# Patient Record
Sex: Female | Born: 1955 | Race: Black or African American | Hispanic: No | Smoking: Former smoker
Health system: Southern US, Community
[De-identification: ages and names within clinical notes are randomized; demographics above are authoritative.]

## PROBLEM LIST (undated history)

## (undated) DIAGNOSIS — F329 Major depressive disorder, single episode, unspecified: Secondary | ICD-10-CM

## (undated) DIAGNOSIS — G4730 Sleep apnea, unspecified: Secondary | ICD-10-CM

## (undated) DIAGNOSIS — R351 Nocturia: Secondary | ICD-10-CM

## (undated) DIAGNOSIS — F32A Depression, unspecified: Secondary | ICD-10-CM

## (undated) DIAGNOSIS — G4733 Obstructive sleep apnea (adult) (pediatric): Secondary | ICD-10-CM

## (undated) DIAGNOSIS — I10 Essential (primary) hypertension: Secondary | ICD-10-CM

## (undated) DIAGNOSIS — E7800 Pure hypercholesterolemia, unspecified: Secondary | ICD-10-CM

## (undated) HISTORY — DX: Depression, unspecified: F32.A

## (undated) HISTORY — PX: PARTIAL HYSTERECTOMY: SHX80

## (undated) HISTORY — DX: Major depressive disorder, single episode, unspecified: F32.9

## (undated) HISTORY — DX: Pure hypercholesterolemia, unspecified: E78.00

## (undated) HISTORY — PX: BREAST EXCISIONAL BIOPSY: SUR124

## (undated) HISTORY — PX: KNEE SURGERY: SHX244

## (undated) HISTORY — PX: CYSTECTOMY: SUR359

## (undated) HISTORY — DX: Morbid (severe) obesity due to excess calories: E66.01

## (undated) HISTORY — DX: Obstructive sleep apnea (adult) (pediatric): G47.33

## (undated) HISTORY — DX: Nocturia: R35.1

## (undated) HISTORY — PX: TONSILLECTOMY: SUR1361

## (undated) HISTORY — PX: BREAST LUMPECTOMY: SHX2

---

## 1997-10-01 ENCOUNTER — Emergency Department (HOSPITAL_COMMUNITY): Admission: EM | Admit: 1997-10-01 | Discharge: 1997-10-01 | Payer: Self-pay | Admitting: Emergency Medicine

## 1997-10-20 ENCOUNTER — Ambulatory Visit (HOSPITAL_COMMUNITY): Admission: RE | Admit: 1997-10-20 | Discharge: 1997-10-20 | Payer: Self-pay | Admitting: Family Medicine

## 1998-10-05 ENCOUNTER — Other Ambulatory Visit: Admission: RE | Admit: 1998-10-05 | Discharge: 1998-10-05 | Payer: Self-pay | Admitting: Family Medicine

## 1998-12-13 ENCOUNTER — Emergency Department (HOSPITAL_COMMUNITY): Admission: EM | Admit: 1998-12-13 | Discharge: 1998-12-13 | Payer: Self-pay | Admitting: Emergency Medicine

## 1998-12-13 ENCOUNTER — Encounter: Payer: Self-pay | Admitting: Emergency Medicine

## 1999-04-04 ENCOUNTER — Ambulatory Visit (HOSPITAL_COMMUNITY): Admission: RE | Admit: 1999-04-04 | Discharge: 1999-04-04 | Payer: Self-pay | Admitting: Family Medicine

## 1999-04-04 ENCOUNTER — Encounter: Payer: Self-pay | Admitting: Family Medicine

## 1999-06-08 ENCOUNTER — Other Ambulatory Visit: Admission: RE | Admit: 1999-06-08 | Discharge: 1999-06-08 | Payer: Self-pay | Admitting: Family Medicine

## 1999-09-14 ENCOUNTER — Other Ambulatory Visit: Admission: RE | Admit: 1999-09-14 | Discharge: 1999-09-14 | Payer: Self-pay | Admitting: Family Medicine

## 2001-03-22 ENCOUNTER — Emergency Department (HOSPITAL_COMMUNITY): Admission: EM | Admit: 2001-03-22 | Discharge: 2001-03-22 | Payer: Self-pay

## 2003-01-06 ENCOUNTER — Other Ambulatory Visit: Admission: RE | Admit: 2003-01-06 | Discharge: 2003-01-06 | Payer: Self-pay | Admitting: Family Medicine

## 2003-04-23 ENCOUNTER — Ambulatory Visit (HOSPITAL_COMMUNITY): Admission: RE | Admit: 2003-04-23 | Discharge: 2003-04-23 | Payer: Self-pay | Admitting: Family Medicine

## 2003-07-28 ENCOUNTER — Ambulatory Visit (HOSPITAL_COMMUNITY): Admission: RE | Admit: 2003-07-28 | Discharge: 2003-07-28 | Payer: Self-pay | Admitting: Family Medicine

## 2004-01-21 ENCOUNTER — Ambulatory Visit (HOSPITAL_COMMUNITY): Admission: RE | Admit: 2004-01-21 | Discharge: 2004-01-21 | Payer: Self-pay | Admitting: Family Medicine

## 2004-03-01 ENCOUNTER — Ambulatory Visit: Payer: Self-pay | Admitting: Family Medicine

## 2004-03-29 ENCOUNTER — Ambulatory Visit: Payer: Self-pay | Admitting: *Deleted

## 2004-04-04 ENCOUNTER — Ambulatory Visit: Payer: Self-pay | Admitting: Family Medicine

## 2004-04-25 ENCOUNTER — Ambulatory Visit: Payer: Self-pay | Admitting: Family Medicine

## 2004-05-23 ENCOUNTER — Ambulatory Visit: Payer: Self-pay | Admitting: Family Medicine

## 2004-06-13 ENCOUNTER — Ambulatory Visit: Payer: Self-pay | Admitting: Family Medicine

## 2004-07-25 ENCOUNTER — Ambulatory Visit: Payer: Self-pay | Admitting: Family Medicine

## 2004-09-30 ENCOUNTER — Ambulatory Visit: Payer: Self-pay | Admitting: Family Medicine

## 2004-11-03 ENCOUNTER — Ambulatory Visit: Payer: Self-pay | Admitting: Family Medicine

## 2004-11-11 ENCOUNTER — Ambulatory Visit (HOSPITAL_COMMUNITY): Admission: RE | Admit: 2004-11-11 | Discharge: 2004-11-11 | Payer: Self-pay | Admitting: Family Medicine

## 2004-11-18 ENCOUNTER — Ambulatory Visit: Payer: Self-pay | Admitting: Family Medicine

## 2004-11-25 ENCOUNTER — Emergency Department (HOSPITAL_COMMUNITY): Admission: EM | Admit: 2004-11-25 | Discharge: 2004-11-25 | Payer: Self-pay | Admitting: Emergency Medicine

## 2005-01-06 ENCOUNTER — Ambulatory Visit: Payer: Self-pay | Admitting: Family Medicine

## 2005-02-14 ENCOUNTER — Ambulatory Visit: Payer: Self-pay | Admitting: Family Medicine

## 2005-02-24 ENCOUNTER — Ambulatory Visit: Payer: Self-pay | Admitting: Family Medicine

## 2005-03-28 ENCOUNTER — Ambulatory Visit: Payer: Self-pay | Admitting: Family Medicine

## 2005-04-20 ENCOUNTER — Ambulatory Visit: Payer: Self-pay | Admitting: Internal Medicine

## 2005-08-07 ENCOUNTER — Ambulatory Visit: Payer: Self-pay | Admitting: Family Medicine

## 2005-12-04 ENCOUNTER — Ambulatory Visit: Payer: Self-pay | Admitting: Family Medicine

## 2006-01-19 ENCOUNTER — Ambulatory Visit: Payer: Self-pay | Admitting: Family Medicine

## 2006-01-24 ENCOUNTER — Encounter: Admission: RE | Admit: 2006-01-24 | Discharge: 2006-01-24 | Payer: Self-pay | Admitting: Family Medicine

## 2006-02-06 ENCOUNTER — Ambulatory Visit: Payer: Self-pay | Admitting: Family Medicine

## 2006-03-23 ENCOUNTER — Ambulatory Visit: Payer: Self-pay | Admitting: Internal Medicine

## 2006-04-19 ENCOUNTER — Ambulatory Visit: Payer: Self-pay | Admitting: Family Medicine

## 2006-07-03 ENCOUNTER — Encounter (INDEPENDENT_AMBULATORY_CARE_PROVIDER_SITE_OTHER): Payer: Self-pay | Admitting: Family Medicine

## 2006-07-03 ENCOUNTER — Ambulatory Visit: Payer: Self-pay | Admitting: Family Medicine

## 2006-08-24 ENCOUNTER — Ambulatory Visit: Payer: Self-pay | Admitting: Internal Medicine

## 2006-08-28 ENCOUNTER — Emergency Department (HOSPITAL_COMMUNITY): Admission: EM | Admit: 2006-08-28 | Discharge: 2006-08-28 | Payer: Self-pay | Admitting: Family Medicine

## 2006-11-06 ENCOUNTER — Ambulatory Visit: Payer: Self-pay | Admitting: Internal Medicine

## 2006-11-21 ENCOUNTER — Ambulatory Visit: Payer: Self-pay | Admitting: Family Medicine

## 2006-11-23 ENCOUNTER — Ambulatory Visit: Payer: Self-pay | Admitting: Family Medicine

## 2006-12-24 ENCOUNTER — Ambulatory Visit (HOSPITAL_BASED_OUTPATIENT_CLINIC_OR_DEPARTMENT_OTHER): Admission: RE | Admit: 2006-12-24 | Discharge: 2006-12-24 | Payer: Self-pay | Admitting: Internal Medicine

## 2006-12-30 ENCOUNTER — Ambulatory Visit: Payer: Self-pay | Admitting: Internal Medicine

## 2007-01-14 ENCOUNTER — Ambulatory Visit: Payer: Self-pay | Admitting: Family Medicine

## 2007-02-05 DIAGNOSIS — I10 Essential (primary) hypertension: Secondary | ICD-10-CM | POA: Insufficient documentation

## 2007-02-05 DIAGNOSIS — R319 Hematuria, unspecified: Secondary | ICD-10-CM | POA: Insufficient documentation

## 2007-02-05 DIAGNOSIS — F329 Major depressive disorder, single episode, unspecified: Secondary | ICD-10-CM

## 2007-02-05 DIAGNOSIS — B009 Herpesviral infection, unspecified: Secondary | ICD-10-CM | POA: Insufficient documentation

## 2007-02-11 DIAGNOSIS — F1011 Alcohol abuse, in remission: Secondary | ICD-10-CM | POA: Insufficient documentation

## 2007-02-11 DIAGNOSIS — G609 Hereditary and idiopathic neuropathy, unspecified: Secondary | ICD-10-CM | POA: Insufficient documentation

## 2007-02-11 DIAGNOSIS — F411 Generalized anxiety disorder: Secondary | ICD-10-CM | POA: Insufficient documentation

## 2007-02-11 DIAGNOSIS — M949 Disorder of cartilage, unspecified: Secondary | ICD-10-CM

## 2007-02-11 DIAGNOSIS — M899 Disorder of bone, unspecified: Secondary | ICD-10-CM | POA: Insufficient documentation

## 2007-02-11 DIAGNOSIS — K219 Gastro-esophageal reflux disease without esophagitis: Secondary | ICD-10-CM

## 2007-02-11 DIAGNOSIS — J309 Allergic rhinitis, unspecified: Secondary | ICD-10-CM | POA: Insufficient documentation

## 2007-02-27 ENCOUNTER — Encounter (INDEPENDENT_AMBULATORY_CARE_PROVIDER_SITE_OTHER): Payer: Self-pay | Admitting: *Deleted

## 2007-02-28 ENCOUNTER — Emergency Department (HOSPITAL_COMMUNITY): Admission: EM | Admit: 2007-02-28 | Discharge: 2007-02-28 | Payer: Self-pay | Admitting: Family Medicine

## 2007-03-06 ENCOUNTER — Ambulatory Visit (HOSPITAL_BASED_OUTPATIENT_CLINIC_OR_DEPARTMENT_OTHER): Admission: RE | Admit: 2007-03-06 | Discharge: 2007-03-06 | Payer: Self-pay | Admitting: Family Medicine

## 2007-03-06 ENCOUNTER — Encounter (INDEPENDENT_AMBULATORY_CARE_PROVIDER_SITE_OTHER): Payer: Self-pay | Admitting: Family Medicine

## 2007-03-10 ENCOUNTER — Ambulatory Visit: Payer: Self-pay | Admitting: Internal Medicine

## 2007-04-04 ENCOUNTER — Telehealth (INDEPENDENT_AMBULATORY_CARE_PROVIDER_SITE_OTHER): Payer: Self-pay | Admitting: Family Medicine

## 2007-04-09 DIAGNOSIS — G4730 Sleep apnea, unspecified: Secondary | ICD-10-CM | POA: Insufficient documentation

## 2007-04-24 ENCOUNTER — Encounter (INDEPENDENT_AMBULATORY_CARE_PROVIDER_SITE_OTHER): Payer: Self-pay | Admitting: Family Medicine

## 2007-05-01 ENCOUNTER — Emergency Department (HOSPITAL_COMMUNITY): Admission: EM | Admit: 2007-05-01 | Discharge: 2007-05-01 | Payer: Self-pay | Admitting: Emergency Medicine

## 2007-05-01 ENCOUNTER — Telehealth (INDEPENDENT_AMBULATORY_CARE_PROVIDER_SITE_OTHER): Payer: Self-pay | Admitting: Family Medicine

## 2007-05-14 ENCOUNTER — Ambulatory Visit: Payer: Self-pay | Admitting: Family Medicine

## 2007-05-14 DIAGNOSIS — G5600 Carpal tunnel syndrome, unspecified upper limb: Secondary | ICD-10-CM | POA: Insufficient documentation

## 2007-06-25 ENCOUNTER — Telehealth (INDEPENDENT_AMBULATORY_CARE_PROVIDER_SITE_OTHER): Payer: Self-pay | Admitting: Family Medicine

## 2007-07-16 ENCOUNTER — Encounter (INDEPENDENT_AMBULATORY_CARE_PROVIDER_SITE_OTHER): Payer: Self-pay | Admitting: Family Medicine

## 2007-07-16 ENCOUNTER — Ambulatory Visit: Payer: Self-pay | Admitting: Family Medicine

## 2007-07-16 LAB — CONVERTED CEMR LAB
Bilirubin Urine: NEGATIVE
Blood Glucose, Fingerstick: 105
Ketones, urine, test strip: NEGATIVE
Pap Smear: NORMAL
Protein, U semiquant: 30
Specific Gravity, Urine: 1.015
Urobilinogen, UA: 0.2

## 2007-08-20 ENCOUNTER — Encounter (INDEPENDENT_AMBULATORY_CARE_PROVIDER_SITE_OTHER): Payer: Self-pay | Admitting: Family Medicine

## 2007-08-20 ENCOUNTER — Telehealth (INDEPENDENT_AMBULATORY_CARE_PROVIDER_SITE_OTHER): Payer: Self-pay | Admitting: *Deleted

## 2007-08-27 ENCOUNTER — Ambulatory Visit: Payer: Self-pay | Admitting: Family Medicine

## 2007-08-27 DIAGNOSIS — R0989 Other specified symptoms and signs involving the circulatory and respiratory systems: Secondary | ICD-10-CM

## 2007-08-27 DIAGNOSIS — R0609 Other forms of dyspnea: Secondary | ICD-10-CM | POA: Insufficient documentation

## 2007-09-01 DIAGNOSIS — E785 Hyperlipidemia, unspecified: Secondary | ICD-10-CM

## 2007-09-12 ENCOUNTER — Encounter (INDEPENDENT_AMBULATORY_CARE_PROVIDER_SITE_OTHER): Payer: Self-pay | Admitting: Family Medicine

## 2007-09-12 ENCOUNTER — Telehealth (INDEPENDENT_AMBULATORY_CARE_PROVIDER_SITE_OTHER): Payer: Self-pay | Admitting: Family Medicine

## 2007-09-13 ENCOUNTER — Encounter (INDEPENDENT_AMBULATORY_CARE_PROVIDER_SITE_OTHER): Payer: Self-pay | Admitting: Family Medicine

## 2007-09-18 ENCOUNTER — Encounter (INDEPENDENT_AMBULATORY_CARE_PROVIDER_SITE_OTHER): Payer: Self-pay | Admitting: Family Medicine

## 2007-09-24 ENCOUNTER — Encounter (INDEPENDENT_AMBULATORY_CARE_PROVIDER_SITE_OTHER): Payer: Self-pay | Admitting: Family Medicine

## 2007-09-24 ENCOUNTER — Emergency Department (HOSPITAL_COMMUNITY): Admission: EM | Admit: 2007-09-24 | Discharge: 2007-09-24 | Payer: Self-pay | Admitting: Emergency Medicine

## 2007-11-04 ENCOUNTER — Encounter (INDEPENDENT_AMBULATORY_CARE_PROVIDER_SITE_OTHER): Payer: Self-pay | Admitting: Family Medicine

## 2007-11-06 ENCOUNTER — Ambulatory Visit: Payer: Self-pay | Admitting: Family Medicine

## 2007-11-06 DIAGNOSIS — K029 Dental caries, unspecified: Secondary | ICD-10-CM | POA: Insufficient documentation

## 2007-12-10 ENCOUNTER — Telehealth (INDEPENDENT_AMBULATORY_CARE_PROVIDER_SITE_OTHER): Payer: Self-pay | Admitting: Family Medicine

## 2007-12-24 ENCOUNTER — Telehealth (INDEPENDENT_AMBULATORY_CARE_PROVIDER_SITE_OTHER): Payer: Self-pay | Admitting: Family Medicine

## 2008-01-17 ENCOUNTER — Telehealth (INDEPENDENT_AMBULATORY_CARE_PROVIDER_SITE_OTHER): Payer: Self-pay | Admitting: *Deleted

## 2008-01-23 ENCOUNTER — Telehealth (INDEPENDENT_AMBULATORY_CARE_PROVIDER_SITE_OTHER): Payer: Self-pay | Admitting: Family Medicine

## 2008-04-28 ENCOUNTER — Telehealth (INDEPENDENT_AMBULATORY_CARE_PROVIDER_SITE_OTHER): Payer: Self-pay | Admitting: Family Medicine

## 2008-04-30 ENCOUNTER — Encounter (INDEPENDENT_AMBULATORY_CARE_PROVIDER_SITE_OTHER): Payer: Self-pay | Admitting: Family Medicine

## 2008-06-15 ENCOUNTER — Ambulatory Visit (HOSPITAL_COMMUNITY): Admission: RE | Admit: 2008-06-15 | Discharge: 2008-06-16 | Payer: Self-pay | Admitting: Orthopedic Surgery

## 2008-07-09 ENCOUNTER — Telehealth (INDEPENDENT_AMBULATORY_CARE_PROVIDER_SITE_OTHER): Payer: Self-pay | Admitting: Family Medicine

## 2008-07-14 ENCOUNTER — Encounter (INDEPENDENT_AMBULATORY_CARE_PROVIDER_SITE_OTHER): Payer: Self-pay | Admitting: Family Medicine

## 2008-07-16 ENCOUNTER — Encounter (INDEPENDENT_AMBULATORY_CARE_PROVIDER_SITE_OTHER): Payer: Self-pay | Admitting: Nurse Practitioner

## 2008-08-03 ENCOUNTER — Telehealth (INDEPENDENT_AMBULATORY_CARE_PROVIDER_SITE_OTHER): Payer: Self-pay | Admitting: *Deleted

## 2008-08-12 ENCOUNTER — Ambulatory Visit: Payer: Self-pay | Admitting: Family Medicine

## 2008-08-12 LAB — CONVERTED CEMR LAB
Albumin: 4.1 g/dL (ref 3.5–5.2)
Alkaline Phosphatase: 116 units/L (ref 39–117)
Band Neutrophils: 0 % (ref 0–10)
Basophils Absolute: 0 10*3/uL (ref 0.0–0.1)
Basophils Relative: 0 % (ref 0–1)
Calcium: 9.3 mg/dL (ref 8.4–10.5)
Cholesterol: 186 mg/dL (ref 0–200)
Creatinine, Ser: 0.89 mg/dL (ref 0.40–1.20)
Eosinophils Absolute: 0.1 10*3/uL (ref 0.0–0.7)
HDL: 45 mg/dL (ref >39)
MCHC: 32.6 g/dL (ref 30.0–36.0)
Monocytes Relative: 10 % (ref 3–12)
Neutro Abs: 3.5 10*3/uL (ref 1.7–7.7)
Neutrophils Relative %: 46 % (ref 43–77)
Nitrite: NEGATIVE
Platelets: 316 10*3/uL (ref 150–400)
Protein, U semiquant: NEGATIVE
RDW: 15.7 % — ABNORMAL HIGH (ref 11.5–15.5)
Sodium: 141 meq/L (ref 135–145)
Specific Gravity, Urine: <1.005
Total Bilirubin: 0.3 mg/dL (ref 0.3–1.2)
Triglycerides: 138 mg/dL (ref <150)
Urobilinogen, UA: 0.2
VLDL: 28 mg/dL (ref 0–40)
WBC Urine, dipstick: NEGATIVE

## 2008-08-13 ENCOUNTER — Encounter (INDEPENDENT_AMBULATORY_CARE_PROVIDER_SITE_OTHER): Payer: Self-pay | Admitting: Family Medicine

## 2008-08-14 ENCOUNTER — Ambulatory Visit (HOSPITAL_COMMUNITY): Admission: RE | Admit: 2008-08-14 | Discharge: 2008-08-14 | Payer: Self-pay | Admitting: Family Medicine

## 2008-10-16 ENCOUNTER — Telehealth (INDEPENDENT_AMBULATORY_CARE_PROVIDER_SITE_OTHER): Payer: Self-pay | Admitting: Family Medicine

## 2008-10-26 ENCOUNTER — Telehealth (INDEPENDENT_AMBULATORY_CARE_PROVIDER_SITE_OTHER): Payer: Self-pay | Admitting: Family Medicine

## 2008-11-16 ENCOUNTER — Ambulatory Visit: Payer: Self-pay | Admitting: Nurse Practitioner

## 2008-11-16 DIAGNOSIS — M62838 Other muscle spasm: Secondary | ICD-10-CM | POA: Insufficient documentation

## 2008-12-28 ENCOUNTER — Telehealth: Payer: Self-pay | Admitting: Physician Assistant

## 2008-12-28 ENCOUNTER — Inpatient Hospital Stay (HOSPITAL_COMMUNITY): Admission: EM | Admit: 2008-12-28 | Discharge: 2008-12-30 | Payer: Self-pay | Admitting: Emergency Medicine

## 2008-12-29 ENCOUNTER — Encounter (INDEPENDENT_AMBULATORY_CARE_PROVIDER_SITE_OTHER): Payer: Self-pay | Admitting: Cardiovascular Disease

## 2008-12-30 ENCOUNTER — Encounter (HOSPITAL_COMMUNITY): Admission: RE | Admit: 2008-12-30 | Discharge: 2009-03-15 | Payer: Self-pay | Admitting: Cardiovascular Disease

## 2009-01-04 ENCOUNTER — Ambulatory Visit: Payer: Self-pay | Admitting: Physician Assistant

## 2009-01-04 DIAGNOSIS — R51 Headache: Secondary | ICD-10-CM

## 2009-01-04 DIAGNOSIS — G5620 Lesion of ulnar nerve, unspecified upper limb: Secondary | ICD-10-CM | POA: Insufficient documentation

## 2009-01-04 DIAGNOSIS — R519 Headache, unspecified: Secondary | ICD-10-CM | POA: Insufficient documentation

## 2009-01-04 DIAGNOSIS — R079 Chest pain, unspecified: Secondary | ICD-10-CM

## 2009-03-24 ENCOUNTER — Ambulatory Visit: Payer: Self-pay | Admitting: Physician Assistant

## 2009-03-24 DIAGNOSIS — N3000 Acute cystitis without hematuria: Secondary | ICD-10-CM | POA: Insufficient documentation

## 2009-03-24 DIAGNOSIS — R05 Cough: Secondary | ICD-10-CM | POA: Insufficient documentation

## 2009-03-24 LAB — CONVERTED CEMR LAB
Basophils Relative: 0 % (ref 0–1)
Calcium: 9.6 mg/dL (ref 8.4–10.5)
Creatinine, Ser: 0.98 mg/dL (ref 0.40–1.20)
Eosinophils Absolute: 0.1 10*3/uL (ref 0.0–0.7)
Eosinophils Relative: 1 % (ref 0–5)
HCT: 40.6 % (ref 36.0–46.0)
Lymphocytes Relative: 32 % (ref 12–46)
Lymphs Abs: 2.7 10*3/uL (ref 0.7–4.0)
MCV: 92.5 fL (ref 78.0–100.0)
Protein, U semiquant: 100
RDW: 15.4 % (ref 11.5–15.5)
Sodium: 143 meq/L (ref 135–145)
Specific Gravity, Urine: >=1.03
TSH: 0.995 microintl units/mL (ref 0.350–4.500)
Urobilinogen, UA: 0.2
WBC: 8.6 10*3/uL (ref 4.0–10.5)

## 2009-03-25 ENCOUNTER — Encounter: Payer: Self-pay | Admitting: Physician Assistant

## 2009-04-02 ENCOUNTER — Encounter: Payer: Self-pay | Admitting: Physician Assistant

## 2009-04-02 ENCOUNTER — Ambulatory Visit (HOSPITAL_COMMUNITY): Admission: RE | Admit: 2009-04-02 | Discharge: 2009-04-02 | Payer: Self-pay | Admitting: Internal Medicine

## 2009-04-27 ENCOUNTER — Ambulatory Visit: Payer: Self-pay | Admitting: Physician Assistant

## 2009-04-27 DIAGNOSIS — J209 Acute bronchitis, unspecified: Secondary | ICD-10-CM

## 2009-04-27 LAB — CONVERTED CEMR LAB
Albumin: 3.9 g/dL (ref 3.5–5.2)
CO2: 28 meq/L (ref 19–32)
Calcium: 9.3 mg/dL (ref 8.4–10.5)
Chloride: 103 meq/L (ref 96–112)
Glucose, Bld: 75 mg/dL (ref 70–99)
Total Protein: 7 g/dL (ref 6.0–8.3)

## 2009-04-28 ENCOUNTER — Encounter: Payer: Self-pay | Admitting: Physician Assistant

## 2009-06-10 ENCOUNTER — Encounter (INDEPENDENT_AMBULATORY_CARE_PROVIDER_SITE_OTHER): Payer: Self-pay | Admitting: *Deleted

## 2009-06-21 ENCOUNTER — Telehealth (INDEPENDENT_AMBULATORY_CARE_PROVIDER_SITE_OTHER): Payer: Self-pay | Admitting: *Deleted

## 2009-06-24 ENCOUNTER — Ambulatory Visit (HOSPITAL_COMMUNITY): Admission: RE | Admit: 2009-06-24 | Discharge: 2009-06-24 | Payer: Self-pay | Admitting: Physician Assistant

## 2009-06-25 ENCOUNTER — Encounter: Payer: Self-pay | Admitting: Physician Assistant

## 2009-06-29 ENCOUNTER — Encounter: Payer: Self-pay | Admitting: Physician Assistant

## 2009-08-24 ENCOUNTER — Emergency Department (HOSPITAL_COMMUNITY): Admission: EM | Admit: 2009-08-24 | Discharge: 2009-08-25 | Payer: Self-pay | Admitting: Emergency Medicine

## 2009-08-25 ENCOUNTER — Telehealth: Payer: Self-pay | Admitting: Physician Assistant

## 2009-08-27 ENCOUNTER — Telehealth: Payer: Self-pay | Admitting: Physician Assistant

## 2009-09-01 ENCOUNTER — Ambulatory Visit: Payer: Self-pay | Admitting: Physician Assistant

## 2009-09-01 DIAGNOSIS — Z8639 Personal history of other endocrine, nutritional and metabolic disease: Secondary | ICD-10-CM

## 2009-09-01 DIAGNOSIS — R319 Hematuria, unspecified: Secondary | ICD-10-CM | POA: Insufficient documentation

## 2009-09-01 DIAGNOSIS — R930 Abnormal findings on diagnostic imaging of skull and head, not elsewhere classified: Secondary | ICD-10-CM | POA: Insufficient documentation

## 2009-09-01 DIAGNOSIS — Z862 Personal history of diseases of the blood and blood-forming organs and certain disorders involving the immune mechanism: Secondary | ICD-10-CM | POA: Insufficient documentation

## 2009-09-01 DIAGNOSIS — D3500 Benign neoplasm of unspecified adrenal gland: Secondary | ICD-10-CM | POA: Insufficient documentation

## 2009-09-01 LAB — CONVERTED CEMR LAB
Bilirubin Urine: NEGATIVE
Glucose, Urine, Semiquant: NEGATIVE
Ketones, urine, test strip: NEGATIVE
Whiff Test: NEGATIVE

## 2009-09-02 ENCOUNTER — Encounter: Payer: Self-pay | Admitting: Physician Assistant

## 2009-09-02 DIAGNOSIS — J90 Pleural effusion, not elsewhere classified: Secondary | ICD-10-CM | POA: Insufficient documentation

## 2009-09-02 LAB — CONVERTED CEMR LAB
ALT: 19 units/L (ref 0–35)
Albumin: 3.9 g/dL (ref 3.5–5.2)
Alkaline Phosphatase: 140 units/L — ABNORMAL HIGH (ref 39–117)
Bacteria, UA: NONE SEEN
Basophils Absolute: 0 10*3/uL (ref 0.0–0.1)
Basophils Relative: 0 % (ref 0–1)
Calcium: 9 mg/dL (ref 8.4–10.5)
Casts: NONE SEEN /lpf
Crystals: NONE SEEN
Glucose, Bld: 90 mg/dL (ref 70–99)
HCT: 41 % (ref 36.0–46.0)
Lymphocytes Relative: 38 % (ref 12–46)
MCV: 88.7 fL (ref 78.0–100.0)
Monocytes Absolute: 0.6 10*3/uL (ref 0.1–1.0)
Monocytes Relative: 8 % (ref 3–12)
Neutrophils Relative %: 53 % (ref 43–77)
Potassium: 3.8 meq/L (ref 3.5–5.3)
RBC: 4.62 M/uL (ref 3.87–5.11)
Total Bilirubin: 0.2 mg/dL — ABNORMAL LOW (ref 0.3–1.2)
WBC: 7.5 10*3/uL (ref 4.0–10.5)

## 2009-09-13 ENCOUNTER — Telehealth: Payer: Self-pay | Admitting: Physician Assistant

## 2009-09-14 ENCOUNTER — Encounter (INDEPENDENT_AMBULATORY_CARE_PROVIDER_SITE_OTHER): Payer: Self-pay | Admitting: *Deleted

## 2009-09-24 ENCOUNTER — Ambulatory Visit (HOSPITAL_COMMUNITY): Admission: RE | Admit: 2009-09-24 | Discharge: 2009-09-24 | Payer: Self-pay | Admitting: Internal Medicine

## 2009-11-11 ENCOUNTER — Telehealth: Payer: Self-pay | Admitting: Physician Assistant

## 2009-11-15 ENCOUNTER — Encounter (INDEPENDENT_AMBULATORY_CARE_PROVIDER_SITE_OTHER): Payer: Self-pay | Admitting: *Deleted

## 2009-12-17 ENCOUNTER — Ambulatory Visit: Payer: Self-pay | Admitting: Physician Assistant

## 2009-12-17 DIAGNOSIS — R5383 Other fatigue: Secondary | ICD-10-CM

## 2009-12-17 DIAGNOSIS — R5381 Other malaise: Secondary | ICD-10-CM | POA: Insufficient documentation

## 2009-12-20 ENCOUNTER — Telehealth: Payer: Self-pay | Admitting: Physician Assistant

## 2009-12-20 LAB — CONVERTED CEMR LAB
ALT: 30 units/L (ref 0–35)
AST: 28 units/L (ref 0–37)
Alkaline Phosphatase: 135 units/L — ABNORMAL HIGH (ref 39–117)
BUN: 12 mg/dL (ref 6–23)
Eosinophils Absolute: 0.1 10*3/uL (ref 0.0–0.7)
Eosinophils Relative: 1 % (ref 0–5)
HCT: 41.1 % (ref 36.0–46.0)
Lymphocytes Relative: 34 % (ref 12–46)
Lymphs Abs: 2.6 10*3/uL (ref 0.7–4.0)
Monocytes Relative: 11 % (ref 3–12)
Neutro Abs: 4.1 10*3/uL (ref 1.7–7.7)
RDW: 16.8 % — ABNORMAL HIGH (ref 11.5–15.5)
Sodium: 143 meq/L (ref 135–145)
Total Bilirubin: 0.4 mg/dL (ref 0.3–1.2)
WBC: 7.7 10*3/uL (ref 4.0–10.5)

## 2009-12-21 ENCOUNTER — Encounter: Payer: Self-pay | Admitting: Physician Assistant

## 2009-12-23 ENCOUNTER — Encounter: Payer: Self-pay | Admitting: Physician Assistant

## 2009-12-23 ENCOUNTER — Encounter (INDEPENDENT_AMBULATORY_CARE_PROVIDER_SITE_OTHER): Payer: Self-pay | Admitting: Nurse Practitioner

## 2009-12-28 ENCOUNTER — Encounter: Payer: Self-pay | Admitting: Physician Assistant

## 2009-12-31 ENCOUNTER — Ambulatory Visit: Payer: Self-pay | Admitting: Physician Assistant

## 2009-12-31 LAB — CONVERTED CEMR LAB
BUN: 10 mg/dL (ref 6–23)
Bilirubin Urine: NEGATIVE
Creatinine, Ser: 0.75 mg/dL (ref 0.40–1.20)
Glucose, Bld: 95 mg/dL (ref 70–99)
Ketones, urine, test strip: NEGATIVE
Nitrite: NEGATIVE
Potassium: 3.8 meq/L (ref 3.5–5.3)
Specific Gravity, Urine: 1.02
Urobilinogen, UA: 0.2

## 2010-01-03 ENCOUNTER — Encounter: Payer: Self-pay | Admitting: Physician Assistant

## 2010-01-07 ENCOUNTER — Ambulatory Visit (HOSPITAL_COMMUNITY): Admission: RE | Admit: 2010-01-07 | Discharge: 2010-01-07 | Payer: Self-pay | Admitting: Internal Medicine

## 2010-01-14 ENCOUNTER — Ambulatory Visit: Payer: Self-pay | Admitting: Physician Assistant

## 2010-01-14 DIAGNOSIS — K7689 Other specified diseases of liver: Secondary | ICD-10-CM

## 2010-01-28 ENCOUNTER — Ambulatory Visit: Payer: Self-pay | Admitting: Physician Assistant

## 2010-01-28 ENCOUNTER — Telehealth: Payer: Self-pay | Admitting: Physician Assistant

## 2010-02-18 ENCOUNTER — Ambulatory Visit: Payer: Self-pay | Admitting: Physician Assistant

## 2010-02-18 LAB — CONVERTED CEMR LAB
Ketones, urine, test strip: NEGATIVE
Nitrite: NEGATIVE
Urobilinogen, UA: 0.2

## 2010-02-21 ENCOUNTER — Encounter: Payer: Self-pay | Admitting: Physician Assistant

## 2010-02-21 DIAGNOSIS — R700 Elevated erythrocyte sedimentation rate: Secondary | ICD-10-CM

## 2010-02-21 DIAGNOSIS — E559 Vitamin D deficiency, unspecified: Secondary | ICD-10-CM | POA: Insufficient documentation

## 2010-02-21 LAB — CONVERTED CEMR LAB
BUN: 11 mg/dL (ref 6–23)
CO2: 28 meq/L (ref 19–32)
Calcium: 9.3 mg/dL (ref 8.4–10.5)
Chloride: 103 meq/L (ref 96–112)
Creatinine, Ser: 0.78 mg/dL (ref 0.40–1.20)
Free T4: 1.1 ng/dL (ref 0.80–1.80)
GGT: 70 units/L — ABNORMAL HIGH (ref 7–51)

## 2010-02-22 ENCOUNTER — Telehealth (INDEPENDENT_AMBULATORY_CARE_PROVIDER_SITE_OTHER): Payer: Self-pay | Admitting: *Deleted

## 2010-02-25 ENCOUNTER — Ambulatory Visit: Payer: Self-pay | Admitting: Physician Assistant

## 2010-03-01 ENCOUNTER — Telehealth: Payer: Self-pay | Admitting: Physician Assistant

## 2010-03-01 LAB — CONVERTED CEMR LAB: Hep B Core Total Ab: NEGATIVE

## 2010-03-08 ENCOUNTER — Encounter: Payer: Self-pay | Admitting: Physician Assistant

## 2010-03-28 ENCOUNTER — Telehealth: Payer: Self-pay | Admitting: Physician Assistant

## 2010-03-29 ENCOUNTER — Telehealth (INDEPENDENT_AMBULATORY_CARE_PROVIDER_SITE_OTHER): Payer: Self-pay | Admitting: Nurse Practitioner

## 2010-04-22 ENCOUNTER — Ambulatory Visit: Payer: Self-pay | Admitting: Physician Assistant

## 2010-04-22 ENCOUNTER — Encounter (INDEPENDENT_AMBULATORY_CARE_PROVIDER_SITE_OTHER): Payer: Self-pay | Admitting: Nurse Practitioner

## 2010-04-22 LAB — CONVERTED CEMR LAB
Cholesterol, target level: 200 mg/dL
LDH: 223 units/L (ref 94–250)
LDL Goal: 130 mg/dL
Total CK: 259 units/L — ABNORMAL HIGH (ref 7–177)
ds DNA Ab: 2 (ref <30)

## 2010-05-20 ENCOUNTER — Ambulatory Visit: Payer: Self-pay | Admitting: Physician Assistant

## 2010-05-20 ENCOUNTER — Encounter (INDEPENDENT_AMBULATORY_CARE_PROVIDER_SITE_OTHER): Payer: Self-pay | Admitting: Nurse Practitioner

## 2010-05-21 ENCOUNTER — Encounter (INDEPENDENT_AMBULATORY_CARE_PROVIDER_SITE_OTHER): Payer: Self-pay | Admitting: Nurse Practitioner

## 2010-05-23 ENCOUNTER — Encounter (INDEPENDENT_AMBULATORY_CARE_PROVIDER_SITE_OTHER): Payer: Self-pay | Admitting: Nurse Practitioner

## 2010-05-26 ENCOUNTER — Encounter (INDEPENDENT_AMBULATORY_CARE_PROVIDER_SITE_OTHER): Payer: Self-pay | Admitting: Internal Medicine

## 2010-05-27 LAB — CONVERTED CEMR LAB: LDL Cholesterol: 157 mg/dL — ABNORMAL HIGH (ref 0–99)

## 2010-07-03 ENCOUNTER — Encounter: Payer: Self-pay | Admitting: Internal Medicine

## 2010-07-03 ENCOUNTER — Encounter: Payer: Self-pay | Admitting: Family Medicine

## 2010-07-10 LAB — CONVERTED CEMR LAB
GGT: 36 units/L (ref 7–51)
HCV Ab: NEGATIVE
Hepatitis B Surface Ag: NEGATIVE

## 2010-07-12 NOTE — Letter (Signed)
Summary: *HSN Results Follow up  [facility]  [address] [place], [place] [address]   Phone: [phone]  Fax: [phone]      01/14/2010   Amber Wong [address] [place], [place]  [address]   Dear  Amber Wong,                            ____S.Drinkard,FNP   ____D. Gore,FNP       ____B. McPherson,MD   ____V. Rankins,MD    ____E. Mulberry,MD    ____N. Daphine Deutscher, FNP  ____D. Reche Dixon, MD    ____K. Philipp Deputy, MD    __x__S. Alben Spittle, PA-C     This letter is to inform you that your recent test(s):  _______Pap Smear    _______Lab Test     ___x____MRI and Chest X-ray    ___x____ is within acceptable limits  _______ requires a medication change  _______ requires a follow-up lab visit  _______ requires a follow-up visit with your Shadd Dunstan   Comments: Small growth on left adrenal gland (gland that sits on top of your kidney) is benign (not harmful).  There is nothing to do for this and it will not hurt you.  Your chest xray was normal.       _________________________________________________________ If you have any questions, please contact our office                     Sincerely,  Tereso Newcomer PA-C [facility]

## 2010-07-12 NOTE — Progress Notes (Signed)
Summary: Schedule appt (Tues. 06/22/09)(work in appt)   Phone Note Call from Patient Call back at [phone]   Summary of Call: mS. Amber Wong called in early in the morning today and she wants somebody call her back.  Pt states that she had bad headache and she pass out last Wednsesday and she feel very weak.  Also she is having pain in her right hand. Alben Spittle PA-C  Initial call taken by: Manon Hilding,  June 21, 2009 8:26 AM  Follow-up for Phone Call        spoke with pt and she said she has been having migranes headaches...Marland KitchenMarland Kitchen pt says she passed out on Wednesday morning .Marland KitchenMarland KitchenMarland KitchenMarland Kitchen pt says when she passed out she was weak and she just passed out...Marland KitchenMarland Kitchen pt says her chest was tight.... pt says she has not been taking...Marland KitchenMarland KitchenMarland Kitchen pt says she has not been taking her proventil because she was scared but will start using it today Follow-up by: Armenia Shannon,  June 21, 2009 9:27 AM  Additional Follow-up for Phone Call Additional follow up Details #1::        Patient needs appointment to be seen today or tomorrow.  If she feels worse before being seen or passes out again, go to the ED.  Additional Follow-up by: Tereso Newcomer PA-C,  June 21, 2009 2:12 PM    Additional Follow-up for Phone Call Additional follow up Details #2::    forward to Front office to call pt and work her into [employer] schedule tomorrow. .....Marland KitchenMarland KitchenMikey College CMA  June 21, 2009 3:47 PM  One of the phone number is not longer in service and her cell number don't allow me to leave a voice mail message.and the contact person phone number is not longer updated. Manon Hilding  June 23, 2009 9:49 AM  Pt couldn't come today but I set up an office visit for Jul 05, 2009.Manon Hilding  June 23, 2009 2:00 PM

## 2010-07-12 NOTE — Letter (Signed)
Summary: *Referral Letter  [facility]  [address] [place], [place] [address]   Phone: [phone]  Fax: [phone]    09/01/2009  Thank you in advance for agreeing to see my patient:  Amber Wong [address] [place], [place]  [address]  Phone: [phone]  Reason for Referral: 55 yo ex-smoker with long h/o microscopic hematuria.  She has apparently never been evaluated for this.  Recent cultures in the emergency room were negative.  She did have a negative CT and negative urine cytology done in the late 1990s/early 2000s.  Please evaluate.  Thank you.  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) 4)  HEMATURIA UNSPECIFIED (ICD-599.70) 5)  ACUTE BRONCHITIS (ICD-466.0) 6)  COUGH (ICD-786.2) 7)  ACUTE CYSTITIS (ICD-595.0) 8)  CHEST PAIN UNSPECIFIED (ICD-786.50) 9)  HEADACHE (ICD-784.0) 10)  ULNAR NEUROPATHY, BILATERAL (ICD-354.2) 11)  MUSCLE SPASM (ICD-728.85) 12)  SCREENING FOR MLIG NEOP, BREAST, NOS (ICD-V76.10) 13)  EXAMINATION, ROUTINE MEDICAL (ICD-V70.0) 14)  DENTAL CARIES (ICD-521.00) 15)  HYPERLIPIDEMIA (ICD-272.4) 16)  DYSPNEA ON EXERTION (ICD-786.09) 17)  CARPAL TUNNEL SYNDROME (ICD-354.0) 18)  SLEEP APNEA (ICD-780.57) 19)  ABUSE, ALCOHOL, 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) 20)  PERIPHERAL NEUROPATHY (ICD-356.9) 21)  OSTEOPENIA (ICD-733.90) 22)  ANXIETY (ICD-300.00) 23)  ALLERGIC RHINITIS (ICD-477.9) 24)  GERD (ICD-530.81) 25)  DEPRESSION (ICD-311) 26)  HYPERTENSION (ICD-401.9) 27)  HEMATURIA (ICD-599.7) 28)  HSV (ICD-054.9)   Current Medications: 1)  VALTREX 500 MG TABS (VALACYCLOVIR HCL) 1 tablet by mouth two times a day x 3 days 2)  HYZAAR 50-12.5 MG TABS (LOSARTAN POTASSIUM-HCTZ) Take 1 tablet by mouth once a day 3)  PRAVASTATIN SODIUM 40 MG  TABS (PRAVASTATIN SODIUM) 1 tablet by mouth nightly for  cholesterol 4)  ASPIRIN 81 MG  TABS (ASPIRIN) Take 1 tablet by mouth once a day 5)  NASACORT AQ 55 MCG/ACT  AERS (TRIAMCINOLONE ACETONIDE(NASAL)) Take 2 squirts each nostril once each day 6)  SINGULAIR 10 MG TABS (MONTELUKAST SODIUM) Take one tablet by mouth once daily 7)  NAPHCON-A 0.025-0.3 %  SOLN (NAPHAZOLINE-PHENIRAMINE) eye gtts 8)  OMEPRAZOLE 20 MG  TBEC (OMEPRAZOLE) Take 1 tablet by mouth every 12 hours for stomach 9)  CLONAZEPAM 1 MG  TABS (CLONAZEPAM) Use 1/2 to 1 tablet by mouth every 12 hours for anxiety 10)  ZOLOFT 100 MG TABS (SERTRALINE HCL) Take 1 tablet by mouth once a day 11)  NASACORT AQ 55 MCG/ACT AERS (TRIAMCINOLONE ACETONIDE(NASAL)) spray two puffs in each nostrils 12)  * GINKGO BILOBA 500 MG CAPS (MISC NATURAL PRODUCTS)  13)  FLEXERIL 5 MG TABS (CYCLOBENZAPRINE HCL) one pill three times a day for muscle relaxztion 14)  VICODIN 5-500 MG TABS (HYDROCODONE-ACETAMINOPHEN) one pill every 6 hours as needed for pain 15)  NAPROSYN 500 MG TABS (NAPROXEN) Take 1 tablet by mouth two times a day for 3 days, then take two times a day as needed for pain. 16)  PROVENTIL HFA 108 (90 BASE) MCG/ACT AERS (ALBUTEROL SULFATE) 1-2 puff q 4-6 hours as needed for shortness of breath or wheezing or cough   Past Medical History: 1)  HYPERTENSION (ICD-401.9) 2)  HYPERLIPIDEMIA (ICD-272.4) 3)  DYSPNEA ON EXERTION (ICD-786.09) 4)    a. negative stress Echo 4.2009 at [facility] 5)    b. neg. myoview at [facility] in 7.2010 (followed by Dr. Algie Coffer) 6)    c. Normal EF by nuclear study\par  7)    d. PFTs 10.22.2010 with poor technique; suggests mixed obstruction and restriction; expiratory time < 6 sec. which will overest. restriction and underest. obstruction; patient prior smoker . . . probable COPD 8)  SLEEP APNEA (ICD-780.57) 9)  GERD (ICD-530.81) 10)  DEPRESSION (ICD-311) 11)  ANXIETY (ICD-300.00) 12)  Hx stomach ulcer 13)  CARPAL TUNNEL SYNDROME (ICD-354.0) 14)  ABUSE, ALCOHOL, IN REMISSION  (ICD-305.03) 15)  PERIPHERAL NEUROPATHY (ICD-356.9) 16)  OSTEOPENIA (ICD-733.90) 17)  ALLERGIC RHINITIS (ICD-477.9) 18)  HEMATURIA (ICD-599.7) 19)  HSV (ICD-054.9) 20)  DENTAL CARIES (ICD-521.00)   Prior History of Blood Transfusions:   Pertinent Labs:    Thank you again for agreeing to see our patient; please contact us if you have any further questions or need additional information.  Sincerely,  Tereso Newcomer PA-C

## 2010-07-12 NOTE — Progress Notes (Signed)
Summary: ? ABOUT HER BP MEDICATION   Phone Note Call from Patient Call back at [facility] Phone [phone]   Summary of Call: WEAVER PT. MS JAMES CALLED AND SAYS THAT YOU TOOK HER OFF LISINOPRIL AND STARTED HER TAKING HYZAAR AND WHEN SHE WHEN SHE WENT TO SEE HER HEART SPECIALIST HE PUT HER BACK ON THE LISINOPRIL, AND NOW SHE WANTS TO KNOW CAN SHE GO BACK TO THE HYZAAR AND WILL IT HURT HER IF SHE DID THAT.  SHE WANTS TO KNOW WHICH ONE TO TAKE TODAY. Initial call taken by: Leodis Rains,  August 25, 2009 8:46 AM  Follow-up for Phone Call        spoke with pt and she said that she stop hyaar two months ago and has been taking the lisinopril since than but she wants to know can she go back to the hyzaar since she was at the hopital and seen a pt who had  an allergic reaction to the med. Follow-up by: Armenia Shannon,  August 25, 2009 11:48 AM  Additional Follow-up for Phone Call Additional follow up Details #1::        Need to know why he changed her. Please request notes from Dr. Algie Coffer. Lisinopril/HCTZ and Hyzaar are very similar meds. I changed her to Hyzaar to help with a cough she had. If she has been taking the Lisinopril for 2 mos and has not had a recurrence of or increased cough, she can continue the Lisinopril. I cannot really change a recommendation that another provider made without knowing why. As far as allergic rxns go, it is not advisable to change a medication because of a potential allergic reaction.  Most patients do not have an allergic reaction with this medication.  Additional Follow-up by: Tereso Newcomer PA-C,  August 25, 2009 11:56 AM    Additional Follow-up for Phone Call Additional follow up Details #2::    pt says she was in the hospital for herself for chest pains...Marland KitchenMarland Kitchen she went home being dx with only indigestion... pt says the way the lady looked scared her and pt has f/u appt with you next week..... i reaquested records from dr Algie Coffer and will sit them in your office...  Armenia Shannon  August 25, 2009 12:15 PM

## 2010-07-12 NOTE — Letter (Signed)
Summary: CARDOLOGY NOTES  CARDOLOGY NOTES   Imported By: Arta Bruce 08/30/2009 10:15:37  _____________________________________________________________________  External Attachment:    Type:   Image     Comment:   External Document

## 2010-07-12 NOTE — Letter (Signed)
Summary: REQUESTING   RECORDS FROM DR.AJAY KADAKIA  REQUESTING   RECORDS FROM DR.AJAY KADAKIA   Imported By: Arta Bruce 01/31/2010 11:20:36  _____________________________________________________________________  External Attachment:    Type:   Image     Comment:   External Document

## 2010-07-12 NOTE — Letter (Signed)
Summary: [facility]   Imported By: Arta Bruce 02/17/2010 12:46:11  _____________________________________________________________________  External Attachment:    Type:   Image     Comment:   External Document

## 2010-07-12 NOTE — Progress Notes (Signed)
Summary: No cholesterol medication due to orange card expiration.   Phone Note Call from Patient   Summary of Call: pt stated that she is out of cholestrol med and the pharmacy will not give it to her til August 26 and thats when she goes to see eligibility for renewing her card because her income has changed and i called the pharmacy and they said they will give her her meds on August 26 when she comes in.... the pt has been out of meds for a week already... Initial call taken by: Armenia Shannon,  January 28, 2010 9:51 AM  Follow-up for Phone Call        Lipitor was filled by me on 8.11.2011 I am not sure why she is already out. Please confirm what she is out of and why. Tereso Newcomer PA-C  January 30, 2010 9:11 PM   she has refills but is not able to get meds til she gets her new card because of her income has changed..this message was basically information for you... the pt did not want you to think she was missing doses when you checked her cholestrol... Armenia Shannon  January 31, 2010 10:11 AM

## 2010-07-12 NOTE — Progress Notes (Signed)
Summary: Need urine specimen   Phone Note From Other Clinic   Summary of Call: No urine received for this pt. Initial call taken by: Vesta Mixer CMA,  December 20, 2009 10:06 AM  Follow-up for Phone Call        Left message on answering machine for pt to call back.Marland KitchenMarland KitchenMarland KitchenArmenia Shannon  December 20, 2009 10:18 AM  Left message with gentleman for pt to call back.Marland KitchenMarland KitchenArmenia Shannon  December 22, 2009 10:24 AM   Additional Follow-up for Phone Call Additional follow up Details #1::        States she has appt. next Friday and she will give urine specimen then.    Additional Follow-up by: Dutch Quint RN,  December 23, 2009 12:34 PM

## 2010-07-12 NOTE — Letter (Signed)
Summary: *HSN Results Follow up  [facility]  [address] [place], [place] [address]   Phone: [phone]  Fax: [phone]      11/15/2009   FELICITAS SINE [address] [place], [place]  [address]   Dear  Ms. Teofilo Pod,                            ____S.Drinkard,FNP   ____D. Gore,FNP       ____B. McPherson,MD   ____V. Rankins,MD    ____E. Mulberry,MD    ____N. Daphine Deutscher, FNP  ____D. Reche Dixon, MD    ____K. Philipp Deputy, MD    ____Other     This letter is to inform you that your recent test(s):  _______Pap Smear    _______Lab Test     _______X-ray    _______ is within acceptable limits  _______ requires a medication change  _______ requires a follow-up lab visit  _______ requires a follow-up visit with your provider   Comments:  We have been trying to contact you.  Please give the office a call at your earliest convenience.       _________________________________________________________ If you have any questions, please contact our office                     Sincerely,  Armenia Shannon [facility]

## 2010-07-12 NOTE — Progress Notes (Signed)
Summary: Change Medication   Phone Note Call from Patient   Summary of Call: The pt wants to let know the provider that she wants to stop taking furoxemide medication because it can cause Lupus and she prefer take lisinopril.   Pt wants the provider call in Lisinopril to the [facility] pharmacy) Alben Spittle PA-c  Initial call taken by: Manon Hilding,  November 11, 2009 3:59 PM  Follow-up for Phone Call        tried calling pt but no answer .Marland KitchenMarland KitchenArmenia Shannon  November 11, 2009 4:18 PM  the customer you are trying to reach phone is off or their unavailable...Marland KitchenMarland KitchenMarland Kitchen Arm[place] Shannon  November 12, 2009 9:51 AM  the customer you are trying to reach phone is off or their unavailable.... will mail letter... Armenia Shannon  November 15, 2009 9:54 AM      Appended Document: Change Medication FYI:  pt says that she doesnt want to take furosemide med. cause it causes lupus... pt says she has not took it since you prescribe for her....  Appended Document: Orders Update  Lasix does not cause Lupus. There is a small risk of causing a flare of Lupus if someone already has Lupus.  This risk is small and should not be causing her to stop taking.  Does she have Lupus??  I don't really understand what we are doing here. I saw her in March.  I ordered some tests.  She never did the CXR.  She never did the CT.  I never saw her back in f/u.  She was supposed to see her cardiologist. Arrange f/u visit. Tereso Newcomer PA-C  November 24, 2009 8:53 AM  Left message on answering machine for pt to call back.Marland KitchenMarland KitchenMarland KitchenArmenia Shannon  November 24, 2009 10:27 AM  appt is schedule on friday on july 8... Armenia Shannon  November 24, 2009 12:08 PM  Clinical Lists Changes

## 2010-07-12 NOTE — Letter (Signed)
Summary: TEST ORDER FORM//MRI//  TEST ORDER FORM//MRI//   Imported By: Arta Bruce 12/24/2009 11:37:18  _____________________________________________________________________  External Attachment:    Type:   Image     Comment:   External Document

## 2010-07-12 NOTE — Progress Notes (Signed)
   Phone Note Outgoing Call   Summary of Call: Rec'd note from Dr. Algie Coffer. His note says that she requested to change to Lisinopril/HCTZ. She should decide which medicine she wants to take. Initial call taken by: Brynda Rim,  August 27, 2009 3:27 PM  Follow-up for Phone Call        spoke with pt and she said that is correct so she would like to be on hyzaar Follow-up by: Armenia Shannon,  August 27, 2009 4:45 PM  Additional Follow-up for Phone Call Additional follow up Details #1::        she can stop the lisinopril and start back on the hyzaar and come in for a bmet 2 weeks later Additional Follow-up by: Brynda Rim,  August 28, 2009 2:37 PM    Additional Follow-up for Phone Call Additional follow up Details #2::    pt is aware Follow-up by: Armenia Shannon,  August 31, 2009 12:19 PM

## 2010-07-12 NOTE — Progress Notes (Signed)
Summary: Urology update  Phone Note From Other Clinic   Caller: Referral Coordinator Summary of Call: Sched Pt an appt @ [facility] for 10-20-09 @ 3pm.When I called to inform Pt of appt, Pt stated that she can not go to [place].There is not other place we can send uninsured Pts.What would you like to do? Initial call taken by: Candi Leash,  September 13, 2009 11:55 AM  Follow-up for Phone Call        Amber, Please find out what the patient wants to do. She needs her hematuria evaluated.  Follow-up by: Tereso Newcomer PA-C,  September 13, 2009 12:45 PM  Additional Follow-up for Phone Call Additional follow up Details #1::        the customer you are trying to reach phone is off or their unavailable...Marland KitchenMarland KitchenArmenia Wong  September 13, 2009 6:09 PM the customer you are trying to reach phone is off or their unavailable [phone]; called work # [phone] no one there by that name. Will mail letter Additional Follow-up by: Gaylyn Cheers RN,  September 14, 2009 12:55 PM    Additional Follow-up for Phone Call Additional follow up Details #2::    Let me know when you hear from her. Follow-up by: Tereso Newcomer PA-C,  September 14, 2009 1:37 PM  Additional Follow-up for Phone Call Additional follow up Details #3:: Details for Additional Follow-up Action Taken: ok..Amber Wong  September 17, 2009 2:06 PM

## 2010-07-12 NOTE — Progress Notes (Signed)
Summary: need refill on her meds.  Phone Note Refill Request Message from:  Patient  patient need her medicine for blood pressure losarten please call pharmacy she is [facility] of medicine [facility] (retail) [address] [place], [place]  [address] Ph: [phone] Fax: [phone]  Initial call taken by: Domenic Polite,  March 28, 2010 3:06 PM  Follow-up for Phone Call        Hyzaar refilled.  Dutch Quint RN  March 28, 2010 4:28 PM

## 2010-07-12 NOTE — Letter (Signed)
Summary: *HSN Results Follow up  [facility]  [address] [place], [place] [address]   Phone: [phone]  Fax: [phone]      01/03/2010   KIRSTEIN BAXLEY [address] [place], [place]  [address]   Dear  Ms. Teofilo Pod,                            ____S.Drinkard,FNP   ____D. Gore,FNP       ____B. McPherson,MD   ____V. Rankins,MD    ____E. Mulberry,MD    ____N. Daphine Deutscher, FNP  ____D. Reche Dixon, MD    ____K. Philipp Deputy, MD    __x__S. Alben Spittle, PA-C     This letter is to inform you that your recent test(s):  _______Pap Smear    ___x____Lab Test     _______X-ray    ___x____ is within acceptable limits  _______ requires a medication change  _______ requires a follow-up lab visit  _______ requires a follow-up visit with your provider   Comments:  Kidney function and potassium ok since starting back on Hyzaar.  Please continue taking Hyzaar.       _________________________________________________________ If you have any questions, please contact our office                     Sincerely,  Tereso Newcomer PA-C [facility]

## 2010-07-12 NOTE — Letter (Signed)
Summary: Generic Letter  [facility]  [address] [place], [place] [address]   Phone: [phone]  Fax: [phone]    09/14/2009  Amber Wong [address] [place], [place]    Dear [name],  We have been unable to reach you by phone. I understand we had an appointment for you to see the urologist at [facility] however you indicated you are not able to go to [name]. There are no other places we can send unisured patients. Tereso Newcomer, [place] feels it is important to be evaluated by a urologist. Please contact us so that we can assist you in making arrangements.         Sincerely,   Gaylyn Cheers RN

## 2010-07-12 NOTE — Miscellaneous (Signed)
Summary: CT vs MRI  Phone Note Outgoing Call   Summary of Call: Spoke with Annabelle Harman from Radiology  [facility] [employer] of Radiology recommends a MRI for f/u on the adrenal abnormality if pt has a contraindication to MRI then she can have a CT of abd/pelvis wihtout contrast to evaluate for renal mass but the preferred test is MRI Hospital records show that pt has an allergy to IVP dye  Radiology schedulers will cancel Ct for tomorrow and reschedule pf for an MRI and notify pt this office will update order Initial call taken by: Lehman Prom FNP,  December 23, 2009 3:59 PM   New Allergies: ! * IVP DYE New Allergies: ! * IVP DYE Clinical Lists Changes  Orders: Added new Test order of MRI without Contrast (MRI w/o Contrast) - Signed Allergies: Added new allergy or adverse reaction of * IVP DYE  Appended Document: CT vs MRI had d/w radiologist who suggested CT for cost savings ok to proceed with MRI as noted above

## 2010-07-12 NOTE — Assessment & Plan Note (Signed)
Summary: F/U MEDS PER PROVIDER / NS  Medications Added HYZAAR 100-25 MG TABS (LOSARTAN POTASSIUM-HCTZ) one tab by mouth daily for blood pressure       Nurse Visit   Vital Signs:  Patient profile:   55 year old female Pulse rate:   91 / minute Pulse rhythm:   regular BP sitting:   158 / 96  (left arm) Cuff size:   large  Vitals Entered By: Armenia Shannon (January 14, 2010 9:47 AM)  Current Medications (verified): 1)  Valtrex 500 Mg Tabs (Valacyclovir Hcl) .Marland Kitchen.. 1 Tablet By Mouth Two Times A Day X 3 Days 2)  Lipitor 20 Mg Tabs (Atorvastatin Calcium) .... Take 1 Tablet By Mouth Once A Day (Not Sure of Dose At This Time - Pharmacy Adjusted To Lipitor) 3)  Aspirin 81 Mg  Tabs (Aspirin) .... Take 1 Tablet By Mouth Once A Day 4)  Singulair 10 Mg Tabs (Montelukast Sodium) .... Take One Tablet By Mouth Once Daily 5)  Naphcon-A 0.025-0.3 %  Soln (Naphazoline-Pheniramine) .... Eye Gtts 6)  Protonix 40 Mg Tbec (Pantoprazole Sodium) .... Take 1 Tablet By Mouth Once A Day 7)  Zoloft 100 Mg Tabs (Sertraline Hcl) .... Take 1 Tablet By Mouth Once A Day 8)  Nasacort Aq 55 Mcg/act Aers (Triamcinolone Acetonide(Nasal)) .... Spray Two Puffs in Each Nostrils 9)  Ginkgo Biloba 500 Mg Caps (Misc Natural Products) 10)  Proventil Hfa 108 (90 Base) Mcg/act Aers (Albuterol Sulfate) .Marland Kitchen.. 1-2 Puff Q 4-6 Hours As Needed For Shortness of Breath or Wheezing or Cough 11)  Hyzaar 100-25 Mg Tabs (Losartan Potassium-Hctz) .... One Tab By Mouth Daily For Blood Pressure 12)  Xyzal 5 Mg Tabs (Levocetirizine Dihydrochloride) .... Take 1 Tablet By Mouth Once A Day For Allergies  Allergies: 1)  ! Pcn 2)  ! * Ivp Dye  Patient Instructions: 1)  You have a new prescription for Hyzaar 100-25mg . 2)  You will take one tab daily. 3)  We will like to see you for a BP check in two weeks.  Orders Added: 1)  Est. Patient Level I [[address]] Prescriptions: HYZAAR 100-25 MG TABS (LOSARTAN POTASSIUM-HCTZ) one tab by mouth daily for  blood pressure  #30 x 1   Entered by:   Armenia Shannon   Authorized by:   Lehman Prom FNP   Signed by:   Armenia Shannon on 01/14/2010   Method used:   Printed then faxed to ...       [facility] (retail)       [address] [place], [place]  [address]       Ph: [phone]       Fax: [phone]   RxID:   [id number]

## 2010-07-12 NOTE — Progress Notes (Signed)
Summary: Lab Follow up  Phone Note From Other Clinic   Caller: [facility]risten) Summary of Call: Baxter Hire stated that Amber Wong called to add additional lab orders this patient but unfortunately there was not enough of the sample to complete following test.   Hep B Core Antibody, ANA, Hep A Antigen Initial call taken by: Hassell Halim CMA,  February 22, 2010 10:22 AM  Follow-up for Phone Call        Amber Wong, Please have blood drawn. Tereso Newcomer PA-C  February 22, 2010 1:14 PM   Additional Follow-up for Phone Call Additional follow up Details #1::        Left message on answering machine for pt to call back...Marland KitchenMarland KitchenArmenia Wong  February 22, 2010 3:21 PM  Left message on answering machine for pt to call back.Marland KitchenMarland KitchenMarland KitchenArmenia Wong  February 23, 2010 9:24 AM     Additional Follow-up for Phone Call Additional follow up Details #2::    PT RETURNED CALL PLEASE CALL HER BACK WHEN YOU GET A CHANCE, [phone].. Follow-up by: Oscar La,  February 23, 2010 11:10 AM  Additional Follow-up for Phone Call Additional follow up Details #3:: Details for Additional Follow-up Action Taken: pt given lab appt for 02/25/10 Chantel Miller  February 23, 2010 11:17 AM   \

## 2010-07-12 NOTE — Assessment & Plan Note (Signed)
Summary: F/u labs   Vital Signs:  Patient profile:   55 year old female Weight:      281.4 pounds BMI:     44.23 Temp:     97.8 degrees F oral Pulse rate:   96 / minute Pulse rhythm:   regular Resp:     20 per minute BP sitting:   126 / 90  (left arm) Cuff size:   large  Vitals Entered By: Levon Hedger (April 22, 2010 11:35 AM)  Nutrition Counseling: Patient's BMI is greater than 25 and therefore counseled on weight management options.  Primary Care Provider:  Tereso Newcomer PA-C   History of Present Illness:  Pt into the office for f/u on labs.  Pt did not bring her medications with her into the office Review of pharmacy sheet indications that she has not picked up some medications since 01/2010. She admits that she has been having problems getting her medications because she was not able to present with the tax forms necessary to get medications  Hypertension History:      She denies headache, chest pain, and palpitations.  She notes no problems with any antihypertensive medication side effects.        Positive major cardiovascular risk factors include hyperlipidemia, hypertension, and family history for ischemic heart disease (females less than 80 years old & males less than 38 years old).  Negative major cardiovascular risk factors include female age less than 33 years old, no history of diabetes, and non-tobacco-user status.    Lipid Management History:      Positive NCEP/ATP III risk factors include family history for ischemic heart disease (females less than 45 years old & males less than 71 years old) and hypertension.  Negative NCEP/ATP III risk factors include female age less than 87 years old, no history of early menopause without estrogen hormone replacement, non-diabetic, and non-tobacco-user status.        The patient does not know about adjunctive measures for cholesterol lowering.  Comments include: pt has not taken any lipitor since august 2011.      Habits  & Providers  Alcohol-Tobacco-Diet     Alcohol drinks/day: 0     Tobacco Status: quit     Year Quit: 2007  Exercise-Depression-Behavior     Does Patient Exercise: no     Drug Use: no     Seat Belt Use: 100     Sun Exposure: occasionally  Allergies: 1)  ! Pcn 2)  ! * Ivp Dye  Review of Systems General:  Denies fever. CV:  Denies chest pain or discomfort. Resp:  Complains of shortness of breath; denies cough. GI:  Denies abdominal pain, nausea, and vomiting. MS:  Complains of joint pain.  Physical Exam  General:  alert.   Head:  normocephalic.   Lungs:  decreased bases Heart:  normal rate and regular rhythm.   Abdomen:  normal bowel sounds.   Msk:  up to the exam table Neurologic:  alert & oriented X3.   Skin:  color normal.   Psych:  Oriented X3.     Impression & Recommendations:  Problem # 1:  HYPERTENSION (ICD-401.9) BP stable advised pt to take meds Her updated medication list for this problem includes:    Hyzaar 100-25 Mg Tabs (Losartan potassium-hctz) ..... One tab by mouth daily for blood pressure  Problem # 2:  DEPRESSION (ICD-311) pt needs to restart medications Her updated medication list for this problem includes:    Zoloft 100 Mg  Tabs (Sertraline hcl) .Marland Kitchen... Take 1 tablet by mouth once a day  Problem # 3:  ELEVATED SEDIMENTATION RATE (ICD-790.1) will check addtional labs as per Scott's recommendation Orders: T- Hemoglobin A1C (10272-53664) T-DNA Antibody (40347-42595) T-RPR (Syphilis) (63875-64332) Rapid HIV  (95188) T-C-Reactive Protein (41660-63016) T-LDH (01093-23557) T-Aldolase (32202-54270) TLB-CK Total Only(Creatine Kinase/CPK) (82550-CK) T- * Misc. Laboratory test [phone])  Problem # 4:  FATIGUE (ICD-780.79) will check for cause in labs  Problem # 5:  DYSPNEA ON EXERTION (ICD-786.09) instructions given and demonstrated by triage nurse on how to use advair sample given in office Her updated medication list for this problem includes:     Singulair 10 Mg Tabs (Montelukast sodium) .Marland Kitchen... Take one tablet by mouth once daily    Proventil Hfa 108 (90 Base) Mcg/act Aers (Albuterol sulfate) .Marland Kitchen... 1-2 puff q 4-6 hours as needed for shortness of breath or wheezing or cough    Hyzaar 100-25 Mg Tabs (Losartan potassium-hctz) ..... One tab by mouth daily for blood pressure    Advair Diskus 100-50 Mcg/dose Aepb (Fluticasone-salmeterol) .Marland Kitchen... 1 inhalation two times a day.  rinse mouth out very well after each use.  Complete Medication List: 1)  Valtrex 500 Mg Tabs (Valacyclovir hcl) .Marland Kitchen.. 1 tablet by mouth two times a day x 3 days as needed for an outbreak; start taking at the first sign of an outbreak 2)  Lipitor 10 Mg Tabs (Atorvastatin calcium) .... Take 1 tablet by mouth once a day 3)  Aspirin 81 Mg Tabs (Aspirin) .... Take 1 tablet by mouth once a day 4)  Singulair 10 Mg Tabs (Montelukast sodium) .... Take one tablet by mouth once daily 5)  Naphcon-a 0.025-0.3 % Soln (Naphazoline-pheniramine) .... Eye gtts 6)  Protonix 40 Mg Tbec (Pantoprazole sodium) .... Take 1 tablet by mouth once a day 7)  Zoloft 100 Mg Tabs (Sertraline hcl) .... Take 1 tablet by mouth once a day 8)  Nasacort Aq 55 Mcg/act Aers (Triamcinolone acetonide(nasal)) .... Spray two puffs in each nostrils 9)  Ginkgo Biloba 500 Mg Caps (misc Natural Products)  10)  Proventil Hfa 108 (90 Base) Mcg/act Aers (Albuterol sulfate) .Marland Kitchen.. 1-2 puff q 4-6 hours as needed for shortness of breath or wheezing or cough 11)  Hyzaar 100-25 Mg Tabs (Losartan potassium-hctz) .... One tab by mouth daily for blood pressure 12)  Xyzal 5 Mg Tabs (Levocetirizine dihydrochloride) .... Take 1 tablet by mouth once a day for allergies 13)  Advair Diskus 100-50 Mcg/dose Aepb (Fluticasone-salmeterol) .Marland Kitchen.. 1 inhalation two times a day.  rinse mouth out very well after each use. 14)  Vitamin D 1000 Unit Tabs (Cholecalciferol) .... Take 1 tablet by mouth once a day  Other Orders: Admin 1st Vaccine  (28315) Flu Vaccine 110yrs + (17616)  Hypertension Assessment/Plan:      The patient's hypertensive risk group is category B: At least one risk factor (excluding diabetes) with no target organ damage.  Her calculated 10 year risk of coronary heart disease is 11 %.  Today's blood pressure is 126/90.  Her blood pressure goal is < 140/90.  Lipid Assessment/Plan:      Based on NCEP/ATP III, the patient's risk factor category is "2 or more risk factors and a calculated 10 year CAD risk of < 20%".  The patient's lipid goals are as follows: Total cholesterol goal is 200; LDL cholesterol goal is 130; HDL cholesterol goal is 40; Triglyceride goal is 150.    Patient Instructions: 1)  Be sure to get your medications  from the pharmacy.   2)  You need to find out what you need to take for them to complete patient assistance forms to order medications from the companies. 3)  You have been given the flu vaccine today. 4)  Additional labs will be done to check the cause of fatigue and joint pain.  You will be notified of the results   Orders Added: 1)  Admin 1st Vaccine [90471] 2)  Flu Vaccine 52yrs + [90658] 3)  Est. Patient Level III [[address]] 4)  T- Hemoglobin A1C [83036-23375] 5)  T-DNA Antibody [86225-23920] 6)  T-RPR (Syphilis) [60454-09811] 7)  Rapid HIV  [92370] 8)  T-C-Reactive Protein [91478-29562] 9)  T-LDH [13086-57846] 10)  T-Aldolase [96295-28413] 11)  TLB-CK Total Only(Creatine Kinase/CPK) [82550-CK] 12)  T- * Misc. Laboratory test [phone]   Immunizations Administered:  Tetanus Vaccine:    Vaccine Type: Tdap    Site: right deltoid    Mfr: GlaxoSmithKline    Dose: 0.5 ml    Route: IM    Given by: Levon Hedger    Exp. Date: 12/10/2010    Lot #: [id number]    VIS given: 04/29/08 version given April 22, 2010.  Influenza Vaccine # 1:    Vaccine Type: Fluvax 3+    Site: left deltoid    Mfr: GlaxoSmithKline    Dose: 0.5 ml    Route: IM    Given by: Levon Hedger    Exp.  Date: 12/10/2010    Lot #: [id number]    VIS given: 01/04/10 version given April 22, 2010.  Flu Vaccine Consent Questions:    Do you have a history of severe allergic reactions to this vaccine? no    Any prior history of allergic reactions to egg and/or gelatin? no    Do you have a sensitivity to the preservative Thimersol? no    Do you have a past history of Guillan-Barre Syndrome? no    Do you currently have an acute febrile illness? no    Have you ever had a severe reaction to latex? no    Vaccine information given and explained to patient? yes    Are you currently pregnant? no    ndc  [phone]  Immunizations Administered:  Tetanus Vaccine:    Vaccine Type: Tdap    Site: right deltoid    Mfr: GlaxoSmithKline    Dose: 0.5 ml    Route: IM    Given by: Levon Hedger    Exp. Date: 12/10/2010    Lot #: [id number]    VIS given: 04/29/08 version given April 22, 2010.  Influenza Vaccine # 1:    Vaccine Type: Fluvax 3+    Site: left deltoid    Mfr: GlaxoSmithKline    Dose: 0.5 ml    Route: IM    Given by: Levon Hedger    Exp. Date: 12/10/2010    Lot #: [id number]    VIS given: 01/04/10 version given April 22, 2010.    Appended Document: HIV neg results    Clinical Lists Changes  Observations: Added new observation of HIVRAPIDRSLT: negative (04/22/2010 14:33)      Laboratory Results    Other Tests  Rapid HIV: negative

## 2010-07-12 NOTE — Progress Notes (Signed)
Summary: Needs labs and follow up appt  Phone Note Outgoing Call   Summary of Call: I never saw her for follow up appt. Bring her in to lab for following tests. Needs appt to discuss results afterward.  Tests: Anti-ds DNA Anti-Smith Anti-RNP Anti-Ro Anti-La CPK Aldolase LDH RPR HIV CRP Hgb A1C Initial call taken by: Tereso Newcomer PA-C,  March 29, 2010 10:49 AM  Follow-up for Phone Call        Left message on answering machine for pt to call back...Marland KitchenMarland KitchenMarland Kitchen pt has two phone notes will close the another stating that pt needs ov and labs Follow-up by: [place] Shannon,  March 29, 2010 11:17 AM  Additional Follow-up for Phone Call Additional follow up Details #1::        [facility]  March 30, 2010 10:55 AM   Please schedule appts. Gaylyn Cheers RN  March 30, 2010 11:04 AM     Additional Follow-up for Phone Call Additional follow up Details #2::    pt into the office today - discussed with her Follow-up by: Lehman Prom FNP,  April 22, 2010 12:25 PM     Impression & Recommendations:  Problem # 1:  ELEVATED SEDIMENTATION RATE (ICD-790.1) tried to get patient in for OV to discuss never made appt has FHx of SLE will try to get her in for f/u labs and then OV after that to discuss results probl needs to go to rheumatology  Complete Medication List: 1)  Valtrex 500 Mg Tabs (Valacyclovir hcl) .Marland Kitchen.. 1 tablet by mouth two times a day x 3 days as needed for an outbreak; start taking at the first sign of an outbreak 2)  Lipitor 10 Mg Tabs (Atorvastatin calcium) .... Take 1 tablet by mouth once a day 3)  Aspirin 81 Mg Tabs (Aspirin) .... Take 1 tablet by mouth once a day 4)  Singulair 10 Mg Tabs (Montelukast sodium) .... Take one tablet by mouth once daily 5)  Naphcon-a 0.025-0.3 % Soln (Naphazoline-pheniramine) .... Eye gtts 6)  Protonix 40 Mg Tbec (Pantoprazole sodium) .... Take 1 tablet by mouth once a day 7)  Zoloft 100 Mg Tabs (Sertraline hcl) .... Take  1 tablet by mouth once a day 8)  Nasacort Aq 55 Mcg/act Aers (Triamcinolone acetonide(nasal)) .... Spray two puffs in each nostrils 9)  Ginkgo Biloba 500 Mg Caps (misc Natural Products)  10)  Proventil Hfa 108 (90 Base) Mcg/act Aers (Albuterol sulfate) .Marland Kitchen.. 1-2 puff q 4-6 hours as needed for shortness of breath or wheezing or cough 11)  Hyzaar 100-25 Mg Tabs (Losartan potassium-hctz) .... One tab by mouth daily for blood pressure 12)  Xyzal 5 Mg Tabs (Levocetirizine dihydrochloride) .... Take 1 tablet by mouth once a day for allergies 13)  Advair Diskus 100-50 Mcg/dose Aepb (Fluticasone-salmeterol) .Marland Kitchen.. 1 inhalation two times a day.  rinse mouth out very well after each use. 14)  Vitamin D 1000 Unit Tabs (Cholecalciferol) .... Take 1 tablet by mouth once a day

## 2010-07-12 NOTE — Letter (Signed)
Summary: *[facility] Results Follow up  [facility]  [address] [place], [place] [address]   Phone: [phone]  Fax: [phone]      06/25/2009   CALEYAH JR [address] [place], [place]  [address]   Dear  Ms. Teofilo Pod,                            ____S.Drinkard,FNP   ____D. Gore,FNP       ____B. McPherson,MD   ____V. Rankins,MD    ____E. Mulberry,MD    ____N. Daphine Deutscher, FNP  ____D. Reche Dixon, MD    ____K. Philipp Deputy, MD    __x__S. Alben Spittle, PA-C     This letter is to inform you that your recent test(s):  _______Pap Smear    _______Lab Test     ____x___X-ray    ___x____ is within acceptable limits  _______ requires a medication change  _______ requires a follow-up lab visit  _______ requires a follow-up visit with your provider   Comments:       _________________________________________________________ If you have any questions, please contact our office                     Sincerely,  Tereso Newcomer PA-C [facility]

## 2010-07-12 NOTE — Assessment & Plan Note (Signed)
Summary: bronchitis /tmm   Vital Signs:  Patient profile:   55 year old female Height:      67 inches Weight:      226 pounds O2 Sat:      93 % on Room air Temp:     97.9 degrees F oral Pulse rate:   76 / minute Pulse rhythm:   regular Resp:     20 per minute BP sitting:   144 / 90  (left arm) Cuff size:   regular  Vitals Entered By: Armenia Shannon (September 01, 2009 8:46 AM)  O2 Flow:  Room air CC: pt is complaining of vaginal burning.. Is Patient Diabetic? No Pain Assessment Patient in pain? no       Does patient need assistance? Functional Status Self care Ambulation Normal Comments PF  1.  320     2. 350       3. 350   Primary Care Provider:  Tereso Newcomer PA-C  CC:  pt is complaining of vaginal burning...  History of Present Illness: Here for f/u. Not seen in a while. Went to ED last week for chest pain.  DDimer elevated.  CT done neg for pulmonary embolus but did have layering pleural effusions and small 11 mm adrenal mass thought to be a benign adenoma (f/u MRI recommended by radiologist).  Neg cardiac markers.  U/a with + blood and many RBCs on microscopic plus yeast.  Was tx with Diflucan.  Reviewed notes and urine culture was negative.  Patient was to see Dr. Algie Coffer next day but she did not.  Has appt. with him next week.  Her K+ was low and she was supposed to take KDur.  She never had Rx filled. Has had some chest pain since going to the ER.  Atypical.  Happens at any time.  Not exertional.  Does have some arm pain on left.  No syncope.  No assoc dyspnea. Denies orthopnea, PND or pedal edema. Denies significant cough.  No hemoptysis.   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) 11)  Muscle Spasm  (ICD-728.85) 12)  Screening For Mlig Neop, Breast, Nos  (ICD-V76.10) 13)  Examination, Routine Medical  (ICD-V70.0) 14)  Dental Caries  (ICD-521.00) 15)  Hyperlipidemia  (ICD-272.4) 16)  Dyspnea On Exertion  (ICD-786.09) 17)  Carpal Tunnel Syndrome  (ICD-354.0) 18)  Sleep Apnea  (ICD-780.57) 19)  Abuse, Alcohol, in Remission  (ICD-305.03) 20)  Peripheral Neuropathy  (ICD-356.9) 21)  Osteopenia  (ICD-733.90) 22)  Anxiety  (ICD-300.00) 23)  Allergic Rhinitis  (ICD-477.9) 24)  Gerd  (ICD-530.81) 25)  Depression  (ICD-311) 26)  Hypertension  (ICD-401.9) 27)  Hematuria  (ICD-599.7) 28)  Hsv  (ICD-054.9)  Allergies: 1)  ! Pcn  Past History:  Past Medical History: Last updated: 06/07/2009 HYPERTENSION (ICD-401.9) HYPERLIPIDEMIA (ICD-272.4) DYSPNEA ON EXERTION (ICD-786.09)   a. negative stress Echo 4.2009 at [facility]   b. neg. myoview at [facility] in 7.2010 (followed by Dr. Algie Coffer)   c. Normal EF by nuclear study\par   d. PFTs 10.22.2010 with poor technique; suggests mixed obstruction and restriction; expiratory time < 6 sec. which will overest.  restriction and underest. obstruction; patient prior smoker . . . probable COPD SLEEP APNEA (ICD-780.57) GERD (ICD-530.81) DEPRESSION (ICD-311) ANXIETY (ICD-300.00) Hx stomach ulcer CARPAL TUNNEL SYNDROME (ICD-354.0) ABUSE, ALCOHOL, 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) PERIPHERAL NEUROPATHY (ICD-356.9) OSTEOPENIA (ICD-733.90) ALLERGIC RHINITIS (ICD-477.9) HEMATURIA (ICD-599.7) HSV (ICD-054.9) DENTAL CARIES (ICD-521.00)  Review of Systems      See HPI Resp:  Denies coughing up blood. GI:  Denies bloody stools and dark tarry stools. GU:  Denies dysuria and hematuria.  Physical Exam  General:  alert, well-developed, and well-nourished.   Head:  normocephalic and atraumatic.   Neck:  supple, no JVD, and no carotid bruits.   Lungs:  normal breath sounds, no  dullness, no crackles, and no wheezes.   Heart:  normal rate, regular rhythm, and no gallop.   Abdomen:  soft, non-tender, and no hepatomegaly.   Neurologic:  alert & oriented X3 and cranial nerves II-XII intact.   Psych:  normally interactive.     Impression & Recommendations:  Problem # 1:  HEMATURIA (ICD-599.7) noted in ED patient says she has had a long h/o this never evaluated will send another U/A today with micro and refer her to urology  reviewed paper chart patient has had for a long time neg CT and neg urine cytology in the past (2000) records indicate she refused referral to urology one note states benign workup but I can find no record of urology appt patient, when asked if she ever saw urology, states: "I don't know.  Maybe.  I don't think so." will go ahead and refer to urology with h/o smoking in past   The following medications were removed from the medication list:    Zithromax Z-pak 250 Mg Tabs (Azithromycin) .Marland Kitchen... 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 Sherrie Mustache at [facility] limited unenhanced CT of the abdomen would be a more cost effective way to evaluate  Orders: CT without Contrast (CT w/o contrast)  Problem # 3:  CT, CHEST, ABNORMAL (ICD-793.1)  small layering pleural effusions noted on chest CT with minimal ATX no fluid noted on CXR no h/o CHF normal LVF by echo done in 2010 has appt with Dr. Algie Coffer next week will review CT with radiology check bnp lung exam is normal and she has no symptoms of chf  d/w CT with Dr. Sherrie Mustache at [facility] by his reading this does appear to be small effusions bilat as noted, patient without any s/s of chf she does have mild LVH and diastolic dysfxn on her echo in July ? if she had mild diastolic chf bnp is pending eff's are small enough that they are likely not clinically significant will get a cxr to f/u and see  what bnp is . . . consider adding furosemide waiting on repeat K+ level first    Orders: T-Comprehensive Metabolic Panel ([id number]) T-BNP  (B Natriuretic Peptide) ([id number]) T-TSH ([id number]) CXR- 2view (CXR)  Problem # 4:  CHEST PAIN UNSPECIFIED (ICD-786.50) has had neg stress test recurrent trip to the ed neg CT for pul emboli card has rec doing cath and she has declined has f/u with card next week  Problem # 5:  DYSPNEA ON EXERTION (ICD-786.09) PFTs were limited by technique I still suspect she has some degree of COPD will try spiriva once she has had further w/u with card  Her updated medication list for this problem includes:  Hyzaar 50-12.5 Mg Tabs (Losartan potassium-hctz) .Marland Kitchen... Take 1 tablet by mouth once a day    Singulair 10 Mg Tabs (Montelukast sodium) .Marland Kitchen... Take one tablet by mouth once daily    Proventil Hfa 108 (90 Base) Mcg/act Aers (Albuterol sulfate) .Marland Kitchen... 1-2 puff q 4-6 hours as needed for shortness of breath or wheezing or cough  Problem # 6:  HYPOKALEMIA, HX OF (ICD-V12.2)  low in the ed last week never took K+ repeat today   Orders: T-Comprehensive Metabolic Panel ([id number])  Complete Medication List: 1)  Valtrex 500 Mg Tabs (Valacyclovir hcl) .Marland Kitchen.. 1 tablet by mouth two times a day x 3 days 2)  Hyzaar 50-12.5 Mg Tabs (Losartan potassium-hctz) .... Take 1 tablet by mouth once a day 3)  Pravastatin Sodium 40 Mg Tabs (Pravastatin sodium) .Marland Kitchen.. 1 tablet by mouth nightly for cholesterol 4)  Aspirin 81 Mg Tabs (Aspirin) .... Take 1 tablet by mouth once a day 5)  Nasacort Aq 55 Mcg/act Aers (Triamcinolone acetonide(nasal)) .... Take 2 squirts each nostril once each day 6)  Singulair 10 Mg Tabs (Montelukast sodium) .... Take one tablet by mouth once daily 7)  Naphcon-a 0.025-0.3 % Soln (Naphazoline-pheniramine) .... Eye gtts 8)  Omeprazole 20 Mg Tbec (Omeprazole) .... Take 1 tablet by mouth every 12 hours for stomach 9)  Clonazepam 1 Mg Tabs  (Clonazepam) .... Use 1/2 to 1 tablet by mouth every 12 hours for anxiety 10)  Zoloft 100 Mg Tabs (Sertraline hcl) .... Take 1 tablet by mouth once a day 11)  Nasacort Aq 55 Mcg/act Aers (Triamcinolone acetonide(nasal)) .... Spray two puffs in each nostrils 12)  Ginkgo Biloba 500 Mg Caps (misc Natural Products)  13)  Flexeril 5 Mg Tabs (Cyclobenzaprine hcl) .... One pill three times a day for muscle relaxztion 14)  Vicodin 5-500 Mg Tabs (Hydrocodone-acetaminophen) .... One pill every 6 hours as needed for pain 15)  Naprosyn 500 Mg Tabs (Naproxen) .... Take 1 tablet by mouth two times a day for 3 days, then take two times a day as needed for pain. 16)  Proventil Hfa 108 (90 Base) Mcg/act Aers (Albuterol sulfate) .Marland Kitchen.. 1-2 puff q 4-6 hours as needed for shortness of breath or wheezing or cough  Other Orders: T-Urinalysis ([id number]) Urology Referral (Urology) T-CBC w/Diff [phone])  Patient Instructions: 1)  Please schedule a follow-up appointment in 2 weeks with Diella Gillingham. 2)  Ask Dr. Algie Coffer to send me notes from your visit. 3)  We will send you to urology for your blood in your urine. 4)  I will talk to radiology about your CT scan.   Laboratory Results   Urine Tests  Date/Time Received: September 01, 2009 9:04 AM   Routine Urinalysis   Glucose: negative   (Normal Range: Negative) Bilirubin: negative   (Normal Range: Negative) Ketone: negative   (Normal Range: Negative) Spec. Gravity: 1.015   (Normal Range: 1.003-1.035) Blood: moderate   (Normal Range: Negative) pH: 7.0   (Normal Range: 5.0-8.0) Protein: negative   (Normal Range: Negative) Urobilinogen: 0.2   (Normal Range: 0-1) Nitrite: negative   (Normal Range: Negative) Leukocyte Esterace: negative   (Normal Range: Negative)      [place] Source: vaginal WBC/hpf: 1-5 Bacteria/hpf: rare Clue cells/hpf: none  Negative whiff Yeast/hpf: none [place] KOH: Negative Trichomonas/hpf: none     CXR  Procedure date:   08/24/2009  Findings:       Comparison: Chest 06/24/2009.    Findings: Lungs clear.  No pleural effusion.  Heart size  normal.    IMPRESSION:   No acute disease.  CT of Chest  Procedure date:  08/25/2009  Findings:       IMPRESSION:   1. No evidence of acute pulmonary embolus.   2.  Small layering pleural effusions.  Minor atelectasis.   3.  Indeterminate 11-mm left adrenal nodule, statistically most   likely to be a benign adenoma. Nonemergent adrenal protocol MRI may   confirm.   Echocardiogram  Procedure date:  12/29/2008  Findings:       Study Conclusions   Left ventricle: The cavity size was normal. There was mild   concentric hypertrophy. Systolic function was normal. Wall motion   was normal; there were no regional wall motion abnormalities. There   was an increased relative contribution of atrial contraction to   ventricular filling.

## 2010-07-12 NOTE — Miscellaneous (Signed)
Summary: Repeat LFTs at OV on 9.9.2011   Clinical Lists Changes  Problems: Assessed LIVER FUNCTION TESTS, ABNORMAL, HX OF as comment only - ALP elevated but GGT normal will repeat LFTs at next OV 9.9.11       Impression & Recommendations:  Problem # 1:  LIVER FUNCTION TESTS, ABNORMAL, HX OF (ICD-V12.2) ALP elevated but GGT normal will repeat LFTs at next OV 9.9.11  Complete Medication List: 1)  Valtrex 500 Mg Tabs (Valacyclovir hcl) .Marland Kitchen.. 1 tablet by mouth two times a day x 3 days 2)  Lipitor 20 Mg Tabs (Atorvastatin calcium) .... Take 1 tablet by mouth once a day (not sure of dose at this time - pharmacy adjusted to lipitor) 3)  Aspirin 81 Mg Tabs (Aspirin) .... Take 1 tablet by mouth once a day 4)  Singulair 10 Mg Tabs (Montelukast sodium) .... Take one tablet by mouth once daily 5)  Naphcon-a 0.025-0.3 % Soln (Naphazoline-pheniramine) .... Eye gtts 6)  Protonix 40 Mg Tbec (Pantoprazole sodium) .... Take 1 tablet by mouth once a day 7)  Zoloft 100 Mg Tabs (Sertraline hcl) .... Take 1 tablet by mouth once a day 8)  Nasacort Aq 55 Mcg/act Aers (Triamcinolone acetonide(nasal)) .... Spray two puffs in each nostrils 9)  Ginkgo Biloba 500 Mg Caps (misc Natural Products)  10)  Proventil Hfa 108 (90 Base) Mcg/act Aers (Albuterol sulfate) .Marland Kitchen.. 1-2 puff q 4-6 hours as needed for shortness of breath or wheezing or cough 11)  Hyzaar 100-25 Mg Tabs (Losartan potassium-hctz) .... One tab by mouth daily for blood pressure 12)  Xyzal 5 Mg Tabs (Levocetirizine dihydrochloride) .... Take 1 tablet by mouth once a day for allergies

## 2010-07-12 NOTE — Progress Notes (Signed)
Summary: Need OV  Phone Note Outgoing Call   Summary of Call: Her hepatitis labs are all negative. But, her test for inflammation is elevated.  This can be elevated for many reasons: a cold, an infection, arthritis, etc. But, she had a lot of pain in different areas.  I want her to come in to see me again to talk about these symptoms.  I will draw some more blood that day. Initial call taken by: Tereso Newcomer PA-C,  March 01, 2010 8:40 AM  Follow-up for Phone Call        Left message on answering machine for pt to call back.Marland KitchenMarland KitchenArmenia Shannon  March 01, 2010 2:21 PM   Left message on answering machine for pt to call back.Marland KitchenMarland KitchenMarland KitchenArmenia Shannon  March 02, 2010 9:12 AM   Additional Follow-up for Phone Call Additional follow up Details #1::        spoke with pt and informed her about the results and pt saild she will call back to make appt with scott Additional Follow-up by: [place] Shannon,  March 03, 2010 8:13 AM     Appended Document: Need OV she needs to make appt I will be here all next week and the Monday after Tereso Newcomer PA-C  March 18, 2010 2:40 PM  Left message on answering machine for pt to call back.Marland KitchenMarland KitchenMarland KitchenArmenia Shannon  March 28, 2010 12:30 PM  have another phone note stating pt needs ov and labs..... Left message on answering machine for pt to call back.Marland KitchenMarland KitchenMarland KitchenArmenia Shannon  March 29, 2010 11:18 AM  Clinical Lists Changes

## 2010-07-12 NOTE — Assessment & Plan Note (Signed)
Summary: COUGH AND FATIGUE///CNS   Vital Signs:  Patient profile:   55 year old female Height:      67 inches Weight:      277 pounds BMI:     43.54 Temp:     98.2 degrees F oral Pulse rate:   80 / minute Pulse rhythm:   regular Resp:     18 per minute BP sitting:   122 / 82  (left arm) Cuff size:   large  Vitals Entered By: [place] Shannon (February 18, 2010 9:53 AM) CC: pt is here because she has been coughing and gets tired quickly...  pt did not bring meds....  Is Patient Diabetic? No Pain Assessment Patient in pain? no       Does patient need assistance? Functional Status Self care Ambulation Normal   Primary Care Provider:  Tereso Newcomer PA-C  CC:  pt is here because she has been coughing and gets tired quickly...  pt did not bring meds.... .  History of Present Illness: Amber Wong returns for followup on fatigue and cough.  I switched her ACE inhibitor to an ARB at last visit.  Also added an antihistamine.  She was also instructed to go back on her CPAP for sleep apnea.  Followup chest x-ray was normal.  MRI of her abdomen demonstrated a benign adrenal adenoma.  She did have an elevated ALT.  This is minimally elevated.  Followup GGT was normal.  She is due for follow up LFTs today.  She continues to be fatigued.  His been ongoing for several months.  She is back on her CPAP without significant improvement.  She denies any fevers chills.  She denies nausea or vomiting.  She does have a symptom of aching all over.  She aches in her arms and legs.  She denies chest pain.  She does have occasional dyspnea with exertion.  She does note a lot of wheezing.  She also continues to cough.  The changes in medications did not seem to make a significant improvement in her cough.  She does take Zoloft for depression.  She feels like her depression is stable.  However, she still has episodes of crying spells.  She denies suicidal ideations or plans.  Allergies (verified): 1)  !  Pcn 2)  ! * Ivp Dye  Physical Exam  General:  alert, well-developed, and well-nourished.   Head:  normocephalic and atraumatic.   Neck:  no jvd  Lungs:  normal breath sounds, no crackles, and no wheezes.   Heart:  normal rate and regular rhythm.   Abdomen:  soft, non-tender, and no hepatomegaly.   Msk:  no trigger point tend noted  Extremities:  trace left pedal edema and trace right pedal edema.   Neurologic:  alert & oriented X3 and cranial nerves II-XII intact.   Psych:  normally interactive.     Impression & Recommendations:  Problem # 1:  FATIGUE (ICD-780.79) ? etiology back on CPAP now ? related to depression check thyroid check vit D check ESR advise to increase activity  Orders: T-Comprehensive Metabolic Panel [phone]) T-Vitamin D (25-Hydroxy) [phone]) T-TSH [phone]) T-T4, Free [phone]) T-Sed Rate (Automated) ([id number])  Problem # 2:  COUGH (ICD-786.2) prob COPD PFTs inconclusive in past but has h/o smoking start Advair  cont proventil  Problem # 3:  LIVER FUNCTION TESTS, ABNORMAL, HX OF (ICD-V12.2) ALP up last time repeat labs  Orders: T-Comprehensive Metabolic Panel ([id number]) T-Gamma GT (GGT) ([id number])  Problem # 4:  HEMATURIA UNSPECIFIED (ICD-599.70) chronic with prior neg workup patient not interested in going to urology  Orders: UA Dipstick w/o Micro (manual) ([id number]) T- * Misc. Laboratory test [phone])  Problem # 5:  DEPRESSION (ICD-311)  ? if cause of her fatigue and diffuse pain not sure that increasing Zoloft will help could always consider switching to Cymbalta refer to Ethelene Browns for counseling  Her updated medication list for this problem includes:    Zoloft 100 Mg Tabs (Sertraline hcl) .Marland Kitchen... Take 1 tablet by mouth once a day  Orders: Psychology Referral (Psychology)  Complete Medication List: 1)  Valtrex 500 Mg Tabs (Valacyclovir hcl) .Marland Kitchen.. 1 tablet by mouth two times a day x 3 days as  needed for an outbreak; start taking at the first sign of an outbreak 2)  Lipitor 10 Mg Tabs (Atorvastatin calcium) .... Take 1 tablet by mouth once a day 3)  Aspirin 81 Mg Tabs (Aspirin) .... Take 1 tablet by mouth once a day 4)  Singulair 10 Mg Tabs (Montelukast sodium) .... Take one tablet by mouth once daily 5)  Naphcon-a 0.025-0.3 % Soln (Naphazoline-pheniramine) .... Eye gtts 6)  Protonix 40 Mg Tbec (Pantoprazole sodium) .... Take 1 tablet by mouth once a day 7)  Zoloft 100 Mg Tabs (Sertraline hcl) .... Take 1 tablet by mouth once a day 8)  Nasacort Aq 55 Mcg/act Aers (Triamcinolone acetonide(nasal)) .... Spray two puffs in each nostrils 9)  Ginkgo Biloba 500 Mg Caps (misc Natural Products)  10)  Proventil Hfa 108 (90 Base) Mcg/act Aers (Albuterol sulfate) .Marland Kitchen.. 1-2 puff q 4-6 hours as needed for shortness of breath or wheezing or cough 11)  Hyzaar 100-25 Mg Tabs (Losartan potassium-hctz) .... One tab by mouth daily for blood pressure 12)  Xyzal 5 Mg Tabs (Levocetirizine dihydrochloride) .... Take 1 tablet by mouth once a day for allergies 13)  Advair Diskus 100-50 Mcg/dose Aepb (Fluticasone-salmeterol) .Marland Kitchen.. 1 inhalation two times a day.  rinse mouth out very well after each use.  Patient Instructions: 1)  Schedule FLP in 3 months. 2)  Schedule appt with Damien Fusi. 3)  Fatigue is often a problem that is hard to find an exact cause.  It may have many causes including depression and deconditioning (not being active).  Try increasing activity by walking once a day for 5 minutes only.  Increase slowly by adding 1 minute a week until you are walking 20-30 minutes a day.  Walk at a good pace.  You should not be able to talk on a cell phone while walking if you are walking at a good pace. 4)  Please schedule a follow-up appointment in 2 months with Scott for fatigue.  Prescriptions: ADVAIR DISKUS 100-50 MCG/DOSE AEPB (FLUTICASONE-SALMETEROL) 1 inhalation two times a day.  RInse mouth out very  well after each use.  #1 x 5   Entered and Authorized by:   Tereso Newcomer PA-C   Signed by:   Tereso Newcomer PA-C on 02/18/2010   Method used:   Print then Give to Patient   RxID:   [id number]   Laboratory Results   Urine Tests    Routine Urinalysis   Glucose: negative   (Normal Range: Negative) Bilirubin: negative   (Normal Range: Negative) Ketone: negative   (Normal Range: Negative) Spec. Gravity: >=1.030   (Normal Range: 1.003-1.035) Blood: large   (Normal Range: Negative) pH: 5.5   (Normal Range: 5.0-8.0) Protein: 30   (Normal Range: Negative) Urobilinogen: 0.2   (Normal Range: 0-1) Nitrite:  negative   (Normal Range: Negative) Leukocyte Esterace: negative   (Normal Range: Negative)

## 2010-07-12 NOTE — Assessment & Plan Note (Signed)
Summary: f/u///cns   Vital Signs:  Patient profile:   55 year old female Height:      67 inches Weight:      257 pounds BMI:     40.40 Temp:     97.9 degrees F oral Pulse rate:   78 / minute Pulse rhythm:   regular Resp:     16 per minute BP sitting:   124 / 78  (left arm) Cuff size:   large  Vitals Entered By: Armenia Shannon (December 17, 2009 10:49 AM) CC: f/u.... Is Patient Diabetic? No Pain Assessment Patient in pain? no       Does patient need assistance? Functional Status Self care Ambulation Normal   Primary Care Provider:  Tereso Newcomer PA-C  CC:  f/u.....  History of Present Illness: 55 year old female returns for followup after an approximate 4 month hiatus.  I last saw her in March.  She had been the emergency room with chest pain.  Chest CT angiogram was negative for pulmonary embolus.  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  She also had layering pleural effusions noted.  I initiated a workup with planned followup.  She also has a cardiologist.  She apparently did see him back.  She did not get a followup chest x-ray as I had ordered.  She also did not get the abdominal CT ordered to evaluate her adrenal mass.  She had been placed on Lasix and potassium for possible working diagnosis of diastolic heart failure.  Her BNP was normal.  She never took the Lasix due to concerns over lupus.  She also has a history of hematuria.  I have found in her paper chart that she had had a negative urine cytology and CT scan at some point in the past but has never seen urology.    She does not want to go to the urologist.  She is back on lisinopril/HCTZ.  Her only complaint today is that of fatigue.  Of note, she stopped wearing her CPAP around the time she became fatigued.  She denies melena or hematochezia.  She denies hematemesis.  She continues to cough.  She noted that her cough got worse and she went back on lisinopril.  She used to be on Hyzaar.  She  denies chest pain.  She denies significant shortness of breath.  She uses albuterol with success with her cough.  She is on chronic Singulair.  She denies orthopnea, PND or edema.  She denies syncope.  She'll occasionally get lightheaded if she stands too quickly.  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) 14)  Muscle Spasm  (ICD-728.85) 15)  Screening For Mlig Neop, Breast, Nos  (ICD-V76.10) 16)  Examination, Routine Medical  (ICD-V70.0) 17)  Dental Caries  (ICD-521.00) 18)  Hyperlipidemia  (ICD-272.4) 19)  Dyspnea On Exertion  (ICD-786.09) 20)  Carpal Tunnel Syndrome  (ICD-354.0) 21)  Sleep Apnea  (ICD-780.57) 22)  Abuse, Alcohol, in Remission  (ICD-305.03) 23)  Peripheral  Neuropathy  (ICD-356.9) 24)  Osteopenia  (ICD-733.90) 25)  Anxiety  (ICD-300.00) 26)  Allergic Rhinitis  (ICD-477.9) 27)  Gerd  (ICD-530.81) 28)  Depression  (ICD-311) 29)  Hypertension  (ICD-401.9) 30)  Hematuria  (ICD-599.7) 31)  Hsv  (ICD-054.9)  Current Medications (verified): 1)  Valtrex 500 Mg Tabs (Valacyclovir Hcl) .Marland Kitchen.. 1 Tablet By Mouth Two Times A Day X 3 Days 2)  Lisinopril-Hydrochlorothiazide 20-12.5 Mg Tabs (Lisinopril-Hydrochlorothiazide) .... Take One Tab By Mouth Every Day 3)  Pravastatin Sodium 40 Mg  Tabs (Pravastatin Sodium) .Marland Kitchen.. 1 Tablet By Mouth Nightly For Cholesterol 4)  Aspirin 81 Mg  Tabs (Aspirin) .... Take 1 Tablet By Mouth Once A Day 5)  Singulair 10 Mg Tabs (Montelukast Sodium) .... Take One Tablet By Mouth Once  Daily 6)  Naphcon-A 0.025-0.3 %  Soln (Naphazoline-Pheniramine) .... Eye Gtts 7)  Omeprazole 20 Mg  Tbec (Omeprazole) .... Take 1 Tablet By Mouth Every 12 Hours For Stomach 8)  Clonazepam 1 Mg  Tabs (Clonazepam) .... Use 1/2 To 1 Tablet By Mouth Every 12 Hours For Anxiety 9)  Zoloft 100 Mg Tabs (Sertraline Hcl) .... Take 1 Tablet By Mouth Once A Day 10)  Nasacort Aq 55 Mcg/act Aers (Triamcinolone Acetonide(Nasal)) .... Spray Two Puffs in Each Nostrils 11)  Ginkgo Biloba 500 Mg Caps (Misc Natural Products) 12)  Flexeril 5 Mg Tabs (Cyclobenzaprine Hcl) .... One Pill Three Times A Day For Muscle Relaxztion 13)  Vicodin 5-500 Mg Tabs (Hydrocodone-Acetaminophen) .... One Pill Every 6 Hours As Needed For Pain 14)  Naprosyn 500 Mg Tabs (Naproxen) .... Take 1 Tablet By Mouth Two Times A Day For 3 Days, Then Take Two Times A Day As Needed For Pain. 15)  Proventil Hfa 108 (90 Base) Mcg/act Aers (Albuterol Sulfate) .Marland Kitchen.. 1-2 Puff Q 4-6 Hours As Needed For Shortness of Breath or Wheezing or Cough 16)  Furosemide 40 Mg Tabs (Furosemide) .... Take 1 Tablet By Mouth Once A Day 17)  Klor-Con M20 20 Meq Cr-Tabs (Potassium Chloride Crys Cr) .... Take 1 Tablet By Mouth Once A Day With The Furosemide  Allergies (verified): 1)  ! Pcn  Past History:  Past Medical History: Last updated: 06/07/2009 HYPERTENSION (ICD-401.9) HYPERLIPIDEMIA (ICD-272.4) DYSPNEA ON EXERTION (ICD-786.09)   a. negative stress Echo 4.2009 at [facility]   b. neg. myoview at [facility] in 7.2010 (followed by Dr. Algie Coffer)   c. Normal EF by nuclear study\par   d. PFTs 10.22.2010 with poor technique; suggests mixed obstruction and restriction; expiratory time < 6 sec. which will overest. restriction and underest. obstruction; patient prior smoker . . . probable COPD SLEEP APNEA (ICD-780.57) GERD (ICD-530.81) DEPRESSION (ICD-311) ANXIETY (ICD-300.00) Hx stomach ulcer CARPAL TUNNEL SYNDROME (ICD-354.0) ABUSE, ALCOHOL, IN REMISSION  (ICD-305.03) PERIPHERAL NEUROPATHY (ICD-356.9) OSTEOPENIA (ICD-733.90) ALLERGIC RHINITIS (ICD-477.9) HEMATURIA (ICD-599.7) HSV (ICD-054.9) DENTAL CARIES (ICD-521.00)  Physical Exam  General:  alert, well-developed, and well-nourished.   Head:  normocephalic and atraumatic.   Eyes:  pupils equal, pupils round, and pupils reactive to light.   Mouth:  pharynx pink and moist.   Neck:  supple, no JVD, and no carotid bruits.   Lungs:  normal breath sounds, no dullness, no crackles, and no wheezes.   Heart:  normal rate, regular rhythm, and no gallop.   Extremities:  trace ankle edema bilat  Neurologic:  alert & oriented X3 and cranial nerves II-XII intact.   Psych:  normally interactive.     Impression & Recommendations:  Problem # 1:  HYPERTENSION (ICD-401.9)  ?  cough related to ACE change back to Hyzaar . Marland Kitchen Teofilo Pod notes she coughed less with this stop K+ since she is not taking Lasix repeat labs in 2 weeks  The following medications were removed from the medication list:    Cozaar 50 Mg Tabs (Losartan potassium) .Marland Kitchen... Take 1 tablet by mouth once a day (pharmacy d/c hyzaar)    Furosemide 40 Mg Tabs (Furosemide) .Marland Kitchen... Take 1 tablet by mouth once a day Her updated medication list for this problem includes:    Hyzaar 50-12.5 Mg Tabs (Losartan potassium-hctz) .Marland Kitchen... 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 Laboratory test [phone]) CT without Contrast (CT w/o contrast)  Problem # 5:  COUGH (ICD-786.2) ? ACE induced ? allergies ? COPD  add xyzal change ACE to ARB assess her symptoms with this  Problem # 6:  FATIGUE (ICD-780.79) likely related to noncompliance with CPAP  Orders: T-CBC w/Diff ([id number])  Complete Medication List: 1)  Valtrex 500 Mg Tabs (Valacyclovir hcl) .Marland Kitchen.. 1 tablet by mouth two times a day x 3 days 2)  Lipitor 20 Mg Tabs (Atorvastatin calcium) .... Take 1 tablet by mouth once a day (not sure of dose at this time - pharmacy adjusted to lipitor) 3)  Aspirin 81 Mg Tabs (Aspirin) .... Take 1 tablet by mouth once a day 4)  Singulair 10 Mg Tabs (Montelukast sodium) .... Take one tablet by mouth once daily 5)  Naphcon-a 0.025-0.3 % Soln (Naphazoline-pheniramine) .... Eye gtts 6)  Protonix 40 Mg Tbec (Pantoprazole sodium) .... Take 1 tablet by mouth once a day 7)  Zoloft 100 Mg Tabs (Sertraline hcl) .... Take 1 tablet by mouth once a day 8)  Nasacort Aq 55 Mcg/act Aers (Triamcinolone acetonide(nasal)) .... Spray two puffs in each nostrils 9)  Ginkgo Biloba 500 Mg Caps (misc Natural Products)  10)  Proventil Hfa 108 (90 Base) Mcg/act Aers (Albuterol sulfate) .Marland Kitchen.. 1-2 puff q 4-6 hours as needed for shortness of breath or wheezing or cough 11)  Hyzaar 50-12.5 Mg Tabs (Losartan potassium-hctz) .... Take 1 tablet by mouth once a day for blood pressure 12)  Xyzal 5 Mg Tabs (Levocetirizine dihydrochloride) .... Take 1 tablet by mouth once a day for allergies  Patient Instructions: 1)  Stop taking Potassium. 2)  Stop taking Lisinopril. 3)  Start back on Hyzaar 50/12.5 mg once daily. 4)  Get records from Dr. Algie Coffer. 5)  Return to lab in 2 weeks for BMET and BP check (401.1). 6)  Please get chest xray and abdominal CT. 7)  Please schedule a follow-up appointment in 2 months with Rexton Greulich for cough and fatigue. 8)    Prescriptions: XYZAL 5 MG TABS  (LEVOCETIRIZINE DIHYDROCHLORIDE) Take 1 tablet by mouth once a day for allergies  #30 x 5   Entered and Authorized by:   Tereso Newcomer PA-C   Signed by:   Tereso Newcomer PA-C on 12/17/2009  Method used:   Print then Give to Patient   RxID:   [id number] HYZAAR 50-12.5 MG TABS (LOSARTAN POTASSIUM-HCTZ) Take 1 tablet by mouth once a day for blood pressure  #30 x 5   Entered and Authorized by:   Tereso Newcomer PA-C   Signed by:   Tereso Newcomer PA-C on 12/17/2009   Method used:   Print then Give to Patient   RxID:   [id number]

## 2010-07-12 NOTE — Letter (Signed)
Summary: PFT REPORT  PFT REPORT   Imported By: Arta Bruce 07/20/2009 11:40:04  _____________________________________________________________________  External Attachment:    Type:   Image     Comment:   External Document

## 2010-07-14 NOTE — Letter (Signed)
Summary: Lipid Letter  [facility]  [address] [place], [place] [address]   Phone: [phone]  Fax: [phone]    05/23/2010  Amber Wong [address] [place], [place]  [address]  Dear Amber Wong:  We have carefully reviewed your last lipid profile from 05/20/2010 and the results are noted below with a summary of recommendations for lipid management.    Cholesterol:       230     Goal: less than 200   HDL "good" Cholesterol:   42     Goal: greater than 40   LDL "bad" Cholesterol:   157     Goal: less than 100   Triglycerides:       157     Goal: less than 150    Labs done during recent office visit shows that your cholesterol is still elevated. You should have been contacted by this office regarding a medication change.    Current Medications: 1)    Valtrex 500 Mg Tabs (Valacyclovir hcl) .Marland Kitchen.. 1 tablet by mouth two times a day x 3 days as needed for an outbreak; start taking at the first sign of an outbreak 2)    Lipitor 20 Mg Tabs (Atorvastatin calcium) .... One tablet by mouth nightly for cholesterol ** note increase in dose** 3)    Aspirin 81 Mg  Tabs (Aspirin) .... Take 1 tablet by mouth once a day 4)    Singulair 10 Mg Tabs (Montelukast sodium) .... Take one tablet by mouth once daily 5)    Naphcon-a 0.025-0.3 %  Soln (Naphazoline-pheniramine) .... Eye gtts 6)    Protonix 40 Mg Tbec (Pantoprazole sodium) .... Take 1 tablet by mouth once a day 7)    Zoloft 100 Mg Tabs (Sertraline hcl) .... Take 1 tablet by mouth once a day 8)    Nasacort Aq 55 Mcg/act Aers (Triamcinolone acetonide(nasal)) .... Spray two puffs in each nostrils 9)    Ginkgo Biloba 500 Mg Caps (misc Natural Products)  10)    Ventolin Hfa 108 (90 Base) Mcg/act Aers (Albuterol sulfate) .... 2 puffs every 4-6 hours as needed for shortness of breath 11)    Hyzaar 100-25 Mg Tabs (Losartan potassium-hctz) .... One tab by mouth daily for blood pressure 12)    Xyzal 5 Mg Tabs  (Levocetirizine dihydrochloride) .... Take 1 tablet by mouth once a day for allergies 13)    Advair Diskus 100-50 Mcg/dose Aepb (Fluticasone-salmeterol) .Marland Kitchen.. 1 inhalation two times a day.  rinse mouth out very well after each use. 14)    Vitamin D 1000 Unit Tabs (Cholecalciferol) .... Take 1 tablet by mouth once a day  If you have any questions, please call. We appreciate being able to work with you.   Sincerely,    Amber Prom, FNP [facility]

## 2010-07-14 NOTE — Letter (Signed)
Summary: *HSN Results Follow up  [facility]  [address] [place], [place] [address]   Phone: [phone]  Fax: [phone]      05/26/2010   RETINA BERNARDY [address] [place], [place]  [address]   Dear  Ms. Teofilo Pod,                            ____S.Drinkard,FNP   ____D. Gore,FNP       ____B. McPherson,MD   ____V. Rankins,MD    ____E. Mulberry,MD    ____N. Daphine Deutscher, FNP  ____D. Reche Dixon, MD    ____K. Philipp Deputy, MD    ____Other     This letter is to inform you that your recent test(s):  _______Pap Smear    __X_____Lab Test     _______X-ray    _______ is within acceptable limits  ___X____ requires a medication change  _______ requires a follow-up lab visit  _______ requires a follow-up visit with your provider   Comments:  We have been trying to reach you.  Please give the office a call.       _________________________________________________________ If you have any questions, please contact our office                     Sincerely,  Armenia Shannon [facility]

## 2010-08-01 ENCOUNTER — Telehealth (INDEPENDENT_AMBULATORY_CARE_PROVIDER_SITE_OTHER): Payer: Self-pay | Admitting: Nurse Practitioner

## 2010-08-05 ENCOUNTER — Encounter (INDEPENDENT_AMBULATORY_CARE_PROVIDER_SITE_OTHER): Payer: Self-pay | Admitting: Nurse Practitioner

## 2010-08-09 NOTE — Letter (Signed)
Summary: Generic Letter  [facility]  [address] [place], [place] [address]   Phone: [phone]  Fax: [phone]        08/05/2010  Amber Wong [address] [place], [place]  [address]  Dear [name],  We have been unable to contact you by telephone.  Please call our office, at your earliest convenience, so that we may speak with you.   Sincerely,   Dutch Quint RN

## 2010-08-09 NOTE — Progress Notes (Addendum)
Summary: LOSARTAN REFILL   Phone Note Refill Request   LOSARTAN  REFILL BP MEDICINE   [facility]     Initial call taken by: Cheryll Dessert,  August 01, 2010 3:08 PM  Follow-up for Phone Call        Has refills available -- just has to call [place] pharmacy. Left message on voicemail for pt. to return call.  Dutch Quint RN  August 01, 2010 4:39 PM  Left message on voicemail for pt. to return call.  Dutch Quint RN  August 04, 2010 5:15 PM  .Left message on voicemail for pt. to return call.   Letter sent.  Dutch Quint RN  August 05, 2010 12:35 PM      Appended Document: LOSARTAN REFILL  Pt. notified of refill availability -- has already picked up Rx.  Dutch Quint RN, August 11, 2010  8:32 AM

## 2010-09-05 LAB — URINALYSIS, ROUTINE W REFLEX MICROSCOPIC
Glucose, UA: NEGATIVE mg/dL
Protein, ur: NEGATIVE mg/dL
pH: 6 (ref 5.0–8.0)

## 2010-09-05 LAB — URINE MICROSCOPIC-ADD ON

## 2010-09-05 LAB — POCT CARDIAC MARKERS
CKMB, poc: 1.6 ng/mL (ref 1.0–8.0)
Myoglobin, poc: 112 ng/mL (ref 12–200)
Troponin i, poc: <0.05 ng/mL (ref 0.00–0.09)

## 2010-09-05 LAB — URINE CULTURE: Culture: NO GROWTH

## 2010-09-05 LAB — BASIC METABOLIC PANEL
BUN: 13 mg/dL (ref 6–23)
Creatinine, Ser: 0.76 mg/dL (ref 0.4–1.2)
GFR calc Af Amer: >60 mL/min (ref >60)
GFR calc non Af Amer: >60 mL/min (ref >60)
Potassium: 2.7 mEq/L — CL (ref 3.5–5.1)

## 2010-09-05 LAB — DIFFERENTIAL
Lymphocytes Relative: 41 % (ref 12–46)
Lymphs Abs: 3.4 10*3/uL (ref 0.7–4.0)
Neutrophils Relative %: 49 % (ref 43–77)

## 2010-09-05 LAB — D-DIMER, QUANTITATIVE: D-Dimer, Quant: 0.67 ug/mL-FEU — ABNORMAL HIGH (ref 0.00–0.48)

## 2010-09-05 LAB — CBC
Platelets: 265 10*3/uL (ref 150–400)
RBC: 4.33 MIL/uL (ref 3.87–5.11)
WBC: 8.3 10*3/uL (ref 4.0–10.5)

## 2010-09-18 LAB — DIFFERENTIAL
Basophils Relative: 0 % (ref 0–1)
Eosinophils Absolute: 0.1 10*3/uL (ref 0.0–0.7)
Eosinophils Relative: 1 % (ref 0–5)
Monocytes Absolute: 0.5 10*3/uL (ref 0.1–1.0)
Monocytes Relative: 8 % (ref 3–12)
Neutrophils Relative %: 56 % (ref 43–77)

## 2010-09-18 LAB — URINALYSIS, ROUTINE W REFLEX MICROSCOPIC
Bilirubin Urine: NEGATIVE
Glucose, UA: NEGATIVE mg/dL
Nitrite: NEGATIVE
Specific Gravity, Urine: 1.004 — ABNORMAL LOW (ref 1.005–1.030)
pH: 7.5 (ref 5.0–8.0)

## 2010-09-18 LAB — CARDIAC PANEL(CRET KIN+CKTOT+MB+TROPI)
CK, MB: 1.6 ng/mL (ref 0.3–4.0)
CK, MB: 1.7 ng/mL (ref 0.3–4.0)
Total CK: 207 U/L — ABNORMAL HIGH (ref 7–177)
Troponin I: 0.02 ng/mL (ref 0.00–0.06)

## 2010-09-18 LAB — CBC
HCT: 39.2 % (ref 36.0–46.0)
MCHC: 32.7 g/dL (ref 30.0–36.0)
MCHC: 33.1 g/dL (ref 30.0–36.0)
MCV: 89.6 fL (ref 78.0–100.0)
Platelets: 282 10*3/uL (ref 150–400)
RBC: 4.29 MIL/uL (ref 3.87–5.11)
RDW: 15.6 % — ABNORMAL HIGH (ref 11.5–15.5)

## 2010-09-18 LAB — POCT CARDIAC MARKERS
CKMB, poc: 1.5 ng/mL (ref 1.0–8.0)
Troponin i, poc: <0.05 ng/mL (ref 0.00–0.09)

## 2010-09-18 LAB — RAPID URINE DRUG SCREEN, HOSP PERFORMED
Cocaine: NOT DETECTED
Opiates: NOT DETECTED

## 2010-09-18 LAB — HEPARIN LEVEL (UNFRACTIONATED)
Heparin Unfractionated: 0.35 IU/mL (ref 0.30–0.70)
Heparin Unfractionated: 0.44 IU/mL (ref 0.30–0.70)

## 2010-09-18 LAB — BASIC METABOLIC PANEL
BUN: 10 mg/dL (ref 6–23)
CO2: 27 mEq/L (ref 19–32)
Calcium: 9.2 mg/dL (ref 8.4–10.5)
Chloride: 105 mEq/L (ref 96–112)
Creatinine, Ser: 0.79 mg/dL (ref 0.4–1.2)
GFR calc Af Amer: >60 mL/min (ref >60)
GFR calc non Af Amer: >60 mL/min (ref >60)
Glucose, Bld: 86 mg/dL (ref 70–99)

## 2010-09-18 LAB — LIPID PANEL
Cholesterol: 149 mg/dL (ref 0–200)
HDL: 34 mg/dL — ABNORMAL LOW (ref >39)
LDL Cholesterol: 85 mg/dL (ref 0–99)
Total CHOL/HDL Ratio: 4.4 RATIO

## 2010-09-18 LAB — URINE MICROSCOPIC-ADD ON

## 2010-09-26 LAB — URINE MICROSCOPIC-ADD ON

## 2010-09-26 LAB — BASIC METABOLIC PANEL
CO2: 30 mEq/L (ref 19–32)
Chloride: 103 mEq/L (ref 96–112)
Creatinine, Ser: 0.83 mg/dL (ref 0.4–1.2)
GFR calc Af Amer: >60 mL/min (ref >60)
Potassium: 2.9 mEq/L — ABNORMAL LOW (ref 3.5–5.1)

## 2010-09-26 LAB — URINALYSIS, ROUTINE W REFLEX MICROSCOPIC
Bilirubin Urine: NEGATIVE
Nitrite: NEGATIVE
Protein, ur: NEGATIVE mg/dL
Specific Gravity, Urine: 1.024 (ref 1.005–1.030)
Urobilinogen, UA: 1 mg/dL (ref 0.0–1.0)

## 2010-10-25 NOTE — Procedures (Signed)
NAMEDICY, SMIGEL                 ACCOUNT NO.:  [id number]   MEDICAL RECORD NO.:  [id number]          PATIENT TYPE:  OUT   LOCATION:  SLEEP CENTER                 FACILITY:  [facility]   PHYSICIAN:  Clinton D. Maple Hudson, MD, FCCP, FACPDATE OF BIRTH:  11/24/[birth year]   DATE OF STUDY:  03/06/2007                            NOCTURNAL POLYSOMNOGRAM   REFERRING PHYSICIAN:  Turkey R. Rankins, M.D.   INDICATIONS FOR PROCEDURE:  Hypersomnia with sleep apnea.   RESULTS:  Epward sleepiness score  11/24, BMI 34.8, weight 230 pounds.   MEDICATIONS:  Home medications are listed and reviewed.   A diagnostic study on December 24, 2006 recorded an AHI of 11.9 per hour.  CPAP titration is requested.   SLEEP ARCHITECTURE:  Total sleep time 285 minutes with sleep efficiency  74%. Stage 1 was 5%; Stage 2, 69%; Stage 3, absent. REM 25% of total  sleep time. Sleep latency 18 minuets. REM latency 74 minutes. Awake  after sleep onset 75 minutes. Arousal index 22.7, indicating increased  EEG arousal. No bedtime medication was taken.   RESPIRATORY DATA:  CPAP titration protocol. CPAP was titrated to 11 CWP,  AHI zero per hour. A small ResMed Mirage Quattro full face mask was used  with heated humidifier.   OXYGEN DATA:  Snoring was prevented by final CPAP with oxygen saturation  held 94% to 95% on room air.   CARDIAC DATA:  Normal sinus rhythm.   MOVEMENT/PARASOMNIA:  Frequent limb jerks were noted but not associated  with arousal or awakening.   IMPRESSION/RECOMMENDATIONS:  1. Successful CPAP titration to 11 CWP, AHI zero per hour. A small      ResMed Mirage Quattro full face mask was used with heated      humidifier.  2. Original diagnostic sleep study on December 24, 2006 had recorded an      AHI of  11.9 per hour.     Clinton D. Maple Hudson, MD, [facility], FACP  Diplomate, Biomedical engineer of Sleep Medicine  Electronically Signed    CDY/MEDQ  D:  03/10/2007 14:36:07  T:  03/10/2007 14:55:36  Job:  [id number]

## 2010-10-25 NOTE — Procedures (Signed)
[name], [name]                 ACCOUNT NO.:  [id number]   MEDICAL RECORD NO.:  [id number]          PATIENT TYPE:  OUT   LOCATION:  SLEEP CENTER                 FACILITY:  [facility]   PHYSICIAN:  Clinton D. Maple Hudson, MD, FCCP, FACPDATE OF BIRTH:  [birth year]02/[birth year]   DATE OF STUDY:  12/24/2006                            NOCTURNAL POLYSOMNOGRAM   REFERRING PHYSICIAN:  Marcene Duos, M.D.   INDICATIONS FOR PROCEDURE:  Hypersomnia with sleep apnea.   RESULTS:  Epward sleepiness score 10/25, BMI 35, weight 201 pounds.   HOME MEDICATIONS:  Listed and reviewed.   SLEEP ARCHITECTURE:  Total sleep time is short, 207 minutes with sleep  efficiency 58%. Stage 1 was 7%; Stage 2, 81%; Stage 3 absent. REM 13% of  total sleep time. Sleep latency 11 minutes; REM latency 91 minutes.  Awake after sleep onset 141 minutes. Arousal index 15, indicating  increased sleep fragmentation. No bedtime medication was taken. There  was sustained time awake between 0049 and 2:00 a.m. December 29, 2006.   RESPIRATORY DATA:  Apnea hypopnea index (AHI, RDI) 11.9 obstructive  events per hour, indicating mild obstructive sleep apnea/hypopnea  syndrome. There were 15 obstructive apnea's and 26 hypopnea's. Most  sleep and all events were recorded while supine. REM AHI 73.8. There  were insufficient events to qualify for CPAP titration by split protocol  on this study night.   OXYGEN DATA:  Mild snoring while supine with oxygen desaturation to a  nadir of 85%. Mean oxygen saturation through the study was 94% on room  air.   CARDIAC DATA:  Normal sinus rhythm.   MOVEMENT/PARASOMNIA:  Occasional limb jerk with little effect on sleep.  Bathroom x2.   IMPRESSION/RECOMMENDATIONS:  1. Short total sleep time after sleep onset at 11:14 p.m. with      sustained awakening between 12:45 and 2:00 a.m., non-specific.  2. Mild obstructive sleep apnea/hypopnea syndrome, AHI 11.9 per hour      with most events while supine and in  REM. She could be encouraged      to sleep off the flat of her back.  3. AHI scores in this range are not usually addressed with CPAP,      unless conservative measures are insufficient and symptoms remain      significant. She can return for CPAP titration if appropriate.      Clinton D. Maple Hudson, MD, [facility], FACP  Diplomate, Biomedical engineer of Sleep Medicine  Electronically Signed     CDY/MEDQ  D:  12/30/2006 11:46:09  T:  12/30/2006 15:04:30  Job:  [id number]

## 2010-10-25 NOTE — Discharge Summary (Signed)
Amber Wong, Amber Wong                 ACCOUNT NO.:  [id number]   MEDICAL RECORD NO.:  [id number]          PATIENT TYPE:  INP   LOCATION:  1405                         FACILITY:  [facility]   PHYSICIAN:  Ricki Rodriguez, M.D.  DATE OF BIRTH:  May 10, 1956   DATE OF ADMISSION:  12/28/2008  DATE OF DISCHARGE:  12/30/2008                               DISCHARGE SUMMARY   HOSPITAL LOCATION:  1405 at [facility].   FINAL DIAGNOSES:  1. Chest pain, probably noncardiac.  2. Hypertension.  3. Obesity.  4. Anxiety and depression.  5. Hyperlipidemia.  6. Hypokalemia.   DISCHARGE MEDICATIONS:  1. Aspirin 81 mg one daily.  2. Pravastatin 20 mg one daily.  3. Protonix 40 mg twice daily.  4. Singulair 10 mg daily.  5. Zoloft 50 mg daily.  6. Lisinopril/hydrochlorothiazide 20/25 mg one daily.  7. Vitamin E 400 units daily.  8. Prenatal vitamin one daily.  9. Ginkgo biloba capsule daily.  10.Nasacort 2 sprays each nostril daily.  11.Tramadol 50 mg every 8 hours as needed.  12.KCL (potassium) 10 mEq one daily.   DISCHARGE DIET:  Low-sodium heart-healthy diet.   DISCHARGE ACTIVITY:  The patient to increase activity slowly and stop  any activity that causes chest pain, shortness of breath, dizziness,  sweating or excessive weakness.   FOLLOWUP:  With Dr. Orpah Cobb in 4 weeks or by [facility] in 2-4  weeks.  The patient to call my office as directed.   HISTORY OF PRESENT ILLNESS:  This is a 55 year old black female who  presented with left-sided chest pain radiating across and bilateral  shoulder areas of 1 day duration with a past medical history positive  for hypertension and family history of coronary artery disease at a  young age.   PHYSICAL EXAMINATION:  VITAL SIGNS:  Pulse 80, respiration 16, blood  pressure 106/72, height 5 feet 8 inches, weight 240 pounds.  GENERAL:  The patient is well-built, well-nourished black female in no  acute distress.  HEENT: The patient  is normocephalic, atraumatic with brown eyes. Pupils  are equal, round, and reactive to light. Extraocular movement intact.  Conjunctivae pink.  Sclerae clear.  NECK:  Supple.  HEART:  Normal S1-S2.  LUNGS:  Clear to auscultation bilaterally.  ABDOMEN:  Soft and nontender.  EXTREMITIES:  No edema, cyanosis, clubbing.  NEUROLOGICALLY:  The patient is alert, oriented x3.  Cranial nerves  grossly intact.   LABORATORY DATA:  Normal electrolytes except potassium of 2.9.  Subsequent potassium of 3.4.  BUN, creatinine normal. Glucose 117, CK-  MB, troponin I negative times three.  Urinalysis unremarkable.  Hemoglobin/hematocrit, WBC count and platelet count normal.   Nuclear stress test negative for reversible ischemia.   EKG normal sinus rhythm with low-voltage.   HOSPITAL COURSE:  The patient was admitted to telemetry unit and  myocardial infarction was ruled out.  She underwent nuclear stress test  that failed to show any reversible ischemia.  Her hypokalemia was  corrected with potassium supplement.   DISPOSITION:  She was discharged home in satisfactory condition with  follow-up by me or by [facility] in 2-4 weeks.      Ricki Rodriguez, M.D.  Electronically Signed     ASK/MEDQ  D:  12/30/2008  T:  12/30/2008  Job:  [id number]

## 2010-10-25 NOTE — Op Note (Signed)
NAMEKEYARI, Amber Wong                 ACCOUNT NO.:  [id number]   MEDICAL RECORD NO.:  [id number]          PATIENT TYPE:  OIB   LOCATION:  2550                         FACILITY:  [facility]   PHYSICIAN:  Almedia Balls. Ranell Patrick, M.D. DATE OF BIRTH:  Aug 25, 1955   DATE OF PROCEDURE:  DATE OF DISCHARGE:                               OPERATIVE REPORT   PREOPERATIVE DIAGNOSES:  Left knee medial meniscus tear.   POSTOPERATIVE DIAGNOSES:  1. Left knee medial meniscus tear.  2. Left knee lateral meniscus tear.  3. Medial femoral condyle chondromalacia grade 3.  4. Lateral tibial condyle chondromalacia grade 3.   PROCEDURE PERFORMED:  Left knee partial medial and lateral meniscectomy  as well as abrasion chondroplasty of the medial femoral condyle and  lateral tibial condyle.  Not to bleeding bone.   SURGEON:  Dr. Ranell Patrick.   ASSISTANT:  None.   ANESTHESIA:  General anesthesia by laryngeal mask.   ESTIMATED BLOOD LOSS:  Minimal.   FLUID REPLACEMENT:  200 mL crystalloid.   INSTRUMENT COUNTS:  Correct.   COMPLICATIONS:  None.   Preoperative antibiotics were given.   INDICATIONS:  The patient is a 55 year old female with a history of  worsening left medial knee pain.  The patient has had mechanical  symptoms including giving way and air locking as well as activity  related pain.  The patient presents now, after failed conservative  management to this point, presents for operative treatment to restore  function and eliminate pain.  Informed consent was obtained.   DESCRIPTION OF PROCEDURE:  After an adequate level of anesthesia was  achieved, the patient was positioned in the supine on the operating  table.  Left leg was correctly identified and a lateral post was  utilized.  After a sterile prep and drape of  the knee, we entered the  knee using standard scalpel ports including superior lateral outflow  anterior lateral scope and the anterior medial working portals.  We  identified some migrated  mild chondromalacia in the patellar femoral  joint now requiring chondroplasty.  Medial lateral gutters free of loose  bodies,  medial compartment was complex, posterior medial meniscal tear  requiring partial meniscectomy, about half the posterior horn and  meniscus had to be removed.  Basket forceps and motorized shaver was  utilized.  There was significant femoral condyle on both partial  thickness condyle os.  This is a condylar fracture with a fairly good-  looking cartilage on the tibial site, could be grade 1 and grade 2  changes but definitely some partial thickness condyle defects, measuring  2 x 2 cm in multiple areas on the femoral condyle on the medial site.  Following a partial meniscectomy and an abrasion chondroplasty, removing  all flaps and fibrillation of cartilage, getting back to stable  articular cartilage, we moved through the notch visualizing ACL and PCL  which were intact, it was quite a bit of synovitis and bleeding  encountered.  We used an OrthoCare unit to coagulate in the knee and  used the shaver to remove some hypertrophic synovium and the ligament of  mucosum.  Lateral part of the compartment, there was a very small radial  tear in the lateral meniscus, we debrided that using Basket forceps and  motorized shaver.  Only about 10% to 15% of this meniscus had to be  removed.  The articular cartilage using lateral compartment were fine on  the femur but definitely some grade 2 and grade 3 changes on the tibia.  However, some fibrillation and loose flaps, cartilage was given  chondroplasty on the tibial site on the lateral compartment.  Final  inspections throughout the knee for any loose diverticular cartilage,  loose bodies, none were encountered at this point.  We concluded the  surgery stitching the wound with 4-0 Monocryl followed by Steri-strips  and sterile dressing.  The patient tolerated the surgery well.      Almedia Balls. Ranell Patrick, M.D.  Electronically  Signed     SRN/MEDQ  D:  06/15/2008  T:  06/16/2008  Job:  [id number]

## 2010-11-01 ENCOUNTER — Other Ambulatory Visit (HOSPITAL_COMMUNITY): Payer: Self-pay | Admitting: Family Medicine

## 2010-11-01 DIAGNOSIS — Z1231 Encounter for screening mammogram for malignant neoplasm of breast: Secondary | ICD-10-CM

## 2010-11-11 ENCOUNTER — Ambulatory Visit (HOSPITAL_COMMUNITY)
Admission: RE | Admit: 2010-11-11 | Discharge: 2010-11-11 | Disposition: A | Discharge status: Home / Self Care | Payer: Self-pay | Source: Ambulatory Visit | Attending: Family Medicine | Admitting: Family Medicine

## 2010-11-11 DIAGNOSIS — Z1231 Encounter for screening mammogram for malignant neoplasm of breast: Secondary | ICD-10-CM | POA: Insufficient documentation

## 2010-11-14 ENCOUNTER — Other Ambulatory Visit: Payer: Self-pay | Admitting: Family Medicine

## 2010-11-14 DIAGNOSIS — R928 Other abnormal and inconclusive findings on diagnostic imaging of breast: Secondary | ICD-10-CM

## 2010-11-18 ENCOUNTER — Emergency Department (HOSPITAL_COMMUNITY)
Admission: EM | Admit: 2010-11-18 | Discharge: 2010-11-18 | Disposition: A | Discharge status: Home / Self Care | Payer: Self-pay | Attending: Emergency Medicine | Admitting: Emergency Medicine

## 2010-11-18 DIAGNOSIS — M549 Dorsalgia, unspecified: Secondary | ICD-10-CM | POA: Insufficient documentation

## 2010-11-18 DIAGNOSIS — E7800 Pure hypercholesterolemia, unspecified: Secondary | ICD-10-CM | POA: Insufficient documentation

## 2010-11-18 DIAGNOSIS — M545 Low back pain, unspecified: Secondary | ICD-10-CM | POA: Insufficient documentation

## 2010-11-18 DIAGNOSIS — R109 Unspecified abdominal pain: Secondary | ICD-10-CM | POA: Insufficient documentation

## 2010-11-18 DIAGNOSIS — I10 Essential (primary) hypertension: Secondary | ICD-10-CM | POA: Insufficient documentation

## 2010-11-18 LAB — URINALYSIS, ROUTINE W REFLEX MICROSCOPIC
Bilirubin Urine: NEGATIVE
Glucose, UA: NEGATIVE mg/dL
Nitrite: NEGATIVE
Protein, ur: 30 mg/dL — AB
Specific Gravity, Urine: 1.026 (ref 1.005–1.030)
Urobilinogen, UA: 1 mg/dL (ref 0.0–1.0)
pH: 7 (ref 5.0–8.0)

## 2010-11-18 LAB — URINE MICROSCOPIC-ADD ON

## 2010-11-21 ENCOUNTER — Ambulatory Visit
Admission: RE | Admit: 2010-11-21 | Discharge: 2010-11-21 | Disposition: A | Discharge status: Home / Self Care | Payer: Self-pay | Source: Ambulatory Visit | Attending: Family Medicine | Admitting: Family Medicine

## 2010-11-21 DIAGNOSIS — R928 Other abnormal and inconclusive findings on diagnostic imaging of breast: Secondary | ICD-10-CM

## 2011-03-17 LAB — BASIC METABOLIC PANEL
BUN: 14 mg/dL (ref 6–23)
Calcium: 9.3 mg/dL (ref 8.4–10.5)
Chloride: 101 mEq/L (ref 96–112)
Creatinine, Ser: 0.88 mg/dL (ref 0.4–1.2)
GFR calc Af Amer: >60 mL/min (ref >60)
GFR calc non Af Amer: >60 mL/min (ref >60)

## 2011-03-17 LAB — CBC
MCV: 89.5 fL (ref 78.0–100.0)
Platelets: 293 10*3/uL (ref 150–400)
RBC: 4.3 MIL/uL (ref 3.87–5.11)
WBC: 7.5 10*3/uL (ref 4.0–10.5)

## 2011-03-17 LAB — DIFFERENTIAL
Eosinophils Absolute: 0.1 10*3/uL (ref 0.0–0.7)
Lymphs Abs: 2 10*3/uL (ref 0.7–4.0)
Neutro Abs: 4.7 10*3/uL (ref 1.7–7.7)
Neutrophils Relative %: 63 % (ref 43–77)

## 2011-03-17 LAB — PROTIME-INR
INR: 1 (ref 0.00–1.49)
Prothrombin Time: 13.8 seconds (ref 11.6–15.2)

## 2011-03-17 LAB — APTT: aPTT: 28 seconds (ref 24–37)

## 2011-03-21 LAB — URINALYSIS, ROUTINE W REFLEX MICROSCOPIC
Bilirubin Urine: NEGATIVE
Nitrite: NEGATIVE
Protein, ur: NEGATIVE
Urobilinogen, UA: 0.2

## 2011-03-21 LAB — URINE CULTURE

## 2011-03-21 LAB — I-STAT 8, (EC8 V) (CONVERTED LAB)
Acid-Base Excess: 1
Bicarbonate: 24.8 — ABNORMAL HIGH
TCO2: 26
pCO2, Ven: 35.2 — ABNORMAL LOW
pH, Ven: 7.456 — ABNORMAL HIGH

## 2011-03-21 LAB — POCT CARDIAC MARKERS
CKMB, poc: 3.4
Myoglobin, poc: 119
Troponin i, poc: <0.05

## 2011-03-23 LAB — CBC
MCHC: 33.5
RBC: 4.41
WBC: 10.8 — ABNORMAL HIGH

## 2011-03-23 LAB — I-STAT 8, (EC8 V) (CONVERTED LAB)
Acid-Base Excess: 1
Chloride: 106
HCT: 43
Operator id: 282201
pCO2, Ven: 40.7 — ABNORMAL LOW

## 2011-03-23 LAB — DIFFERENTIAL
Basophils Relative: 0
Lymphocytes Relative: 30
Lymphs Abs: 3.3
Monocytes Relative: 10
Neutro Abs: 6.4
Neutrophils Relative %: 59

## 2011-03-23 LAB — POCT I-STAT CREATININE: Operator id: 282201

## 2011-03-23 LAB — POCT CARDIAC MARKERS
Operator id: 282201
Troponin i, poc: <0.05

## 2011-04-19 ENCOUNTER — Other Ambulatory Visit: Payer: Self-pay | Admitting: Family Medicine

## 2011-04-19 DIAGNOSIS — N63 Unspecified lump in unspecified breast: Secondary | ICD-10-CM

## 2011-05-24 ENCOUNTER — Ambulatory Visit
Admission: RE | Admit: 2011-05-24 | Discharge: 2011-05-24 | Disposition: A | Discharge status: Home / Self Care | Payer: Medicaid Other | Source: Ambulatory Visit | Attending: Family Medicine | Admitting: Family Medicine

## 2011-05-24 DIAGNOSIS — N63 Unspecified lump in unspecified breast: Secondary | ICD-10-CM

## 2011-07-06 IMAGING — CR DG CHEST 2V
2 series · 2 of 2 positions shown · non-contrast
Comparison: Chest 06/24/2009.

CLINICAL DATA: Chest pain.

CHEST - 2 VIEW

[w chest pa]
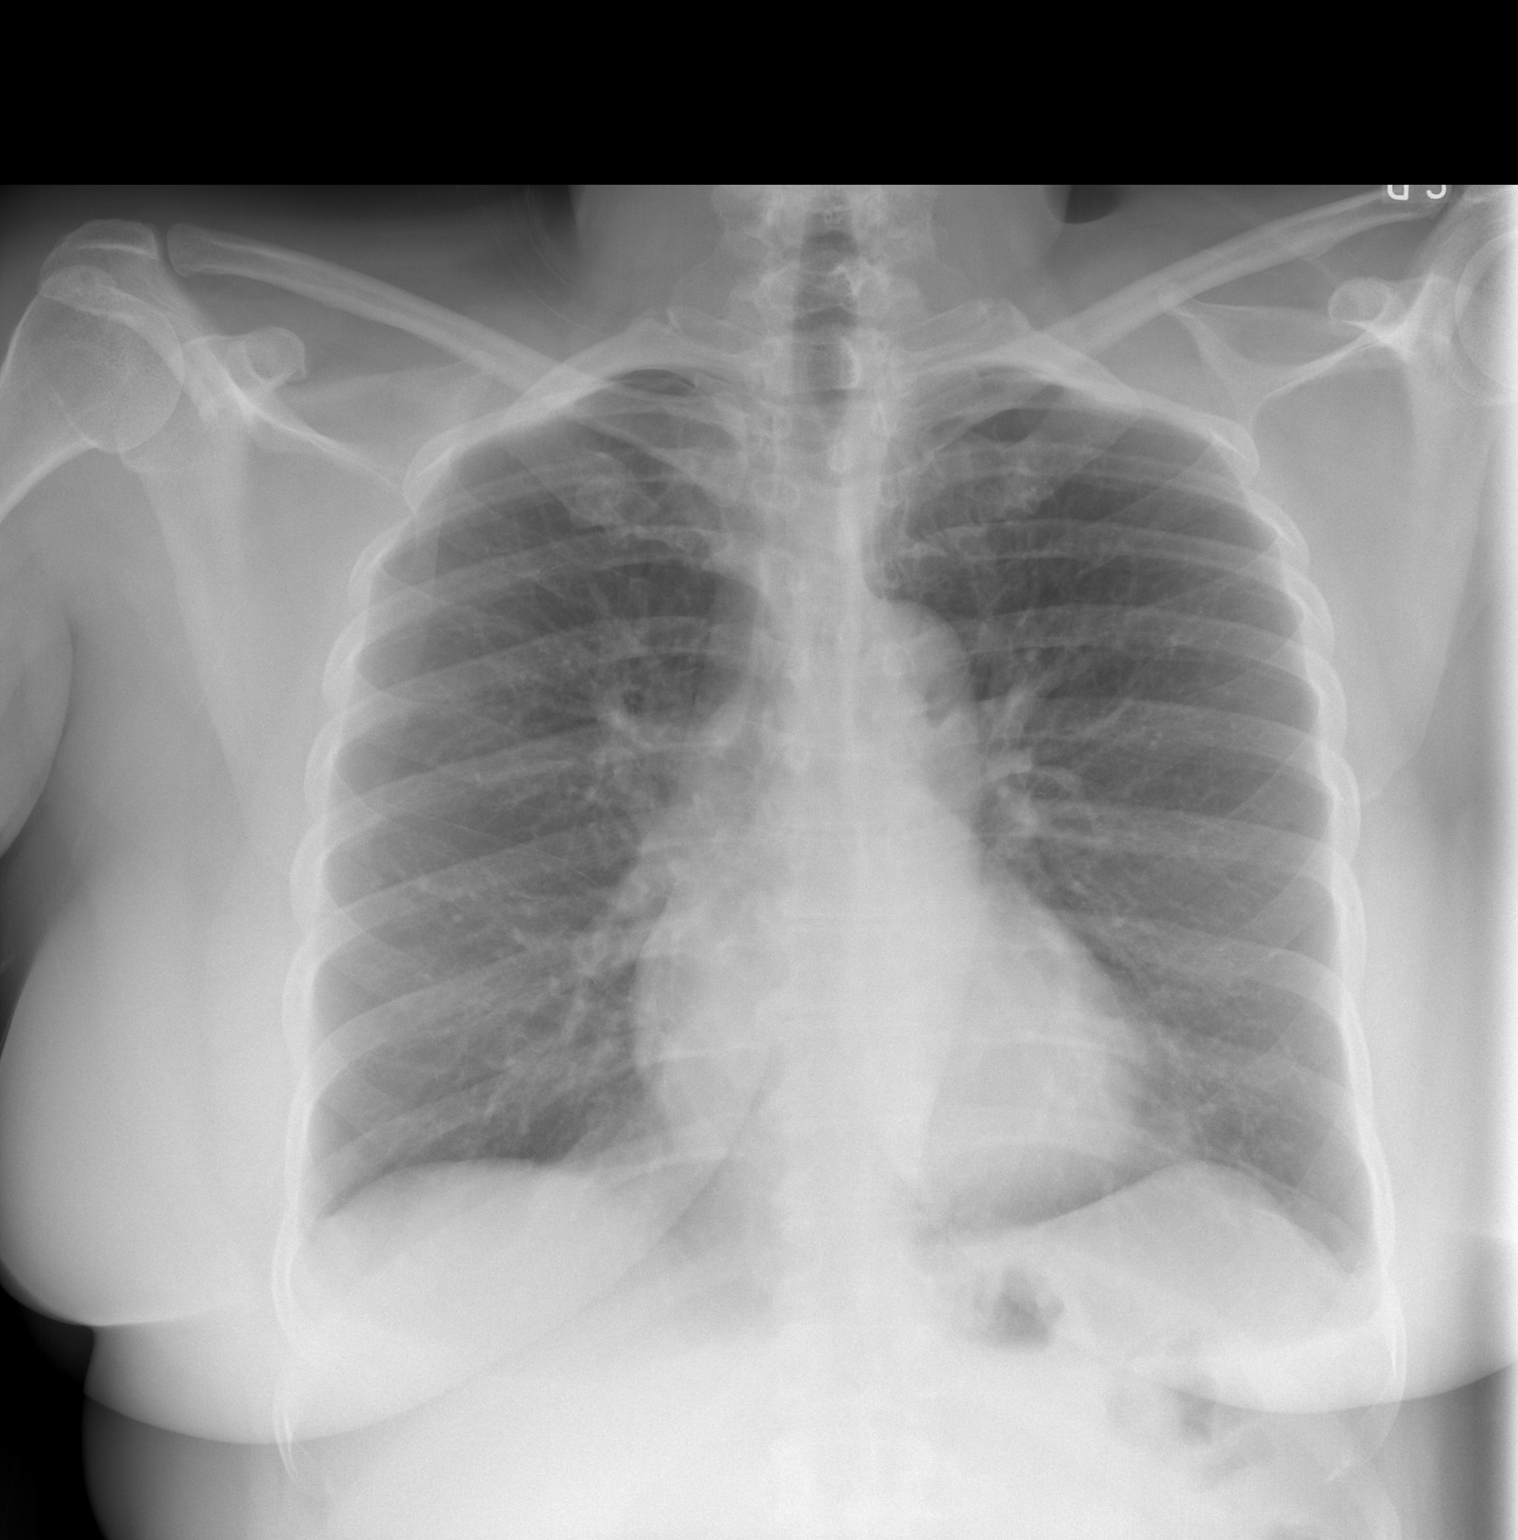

[w chest lat]
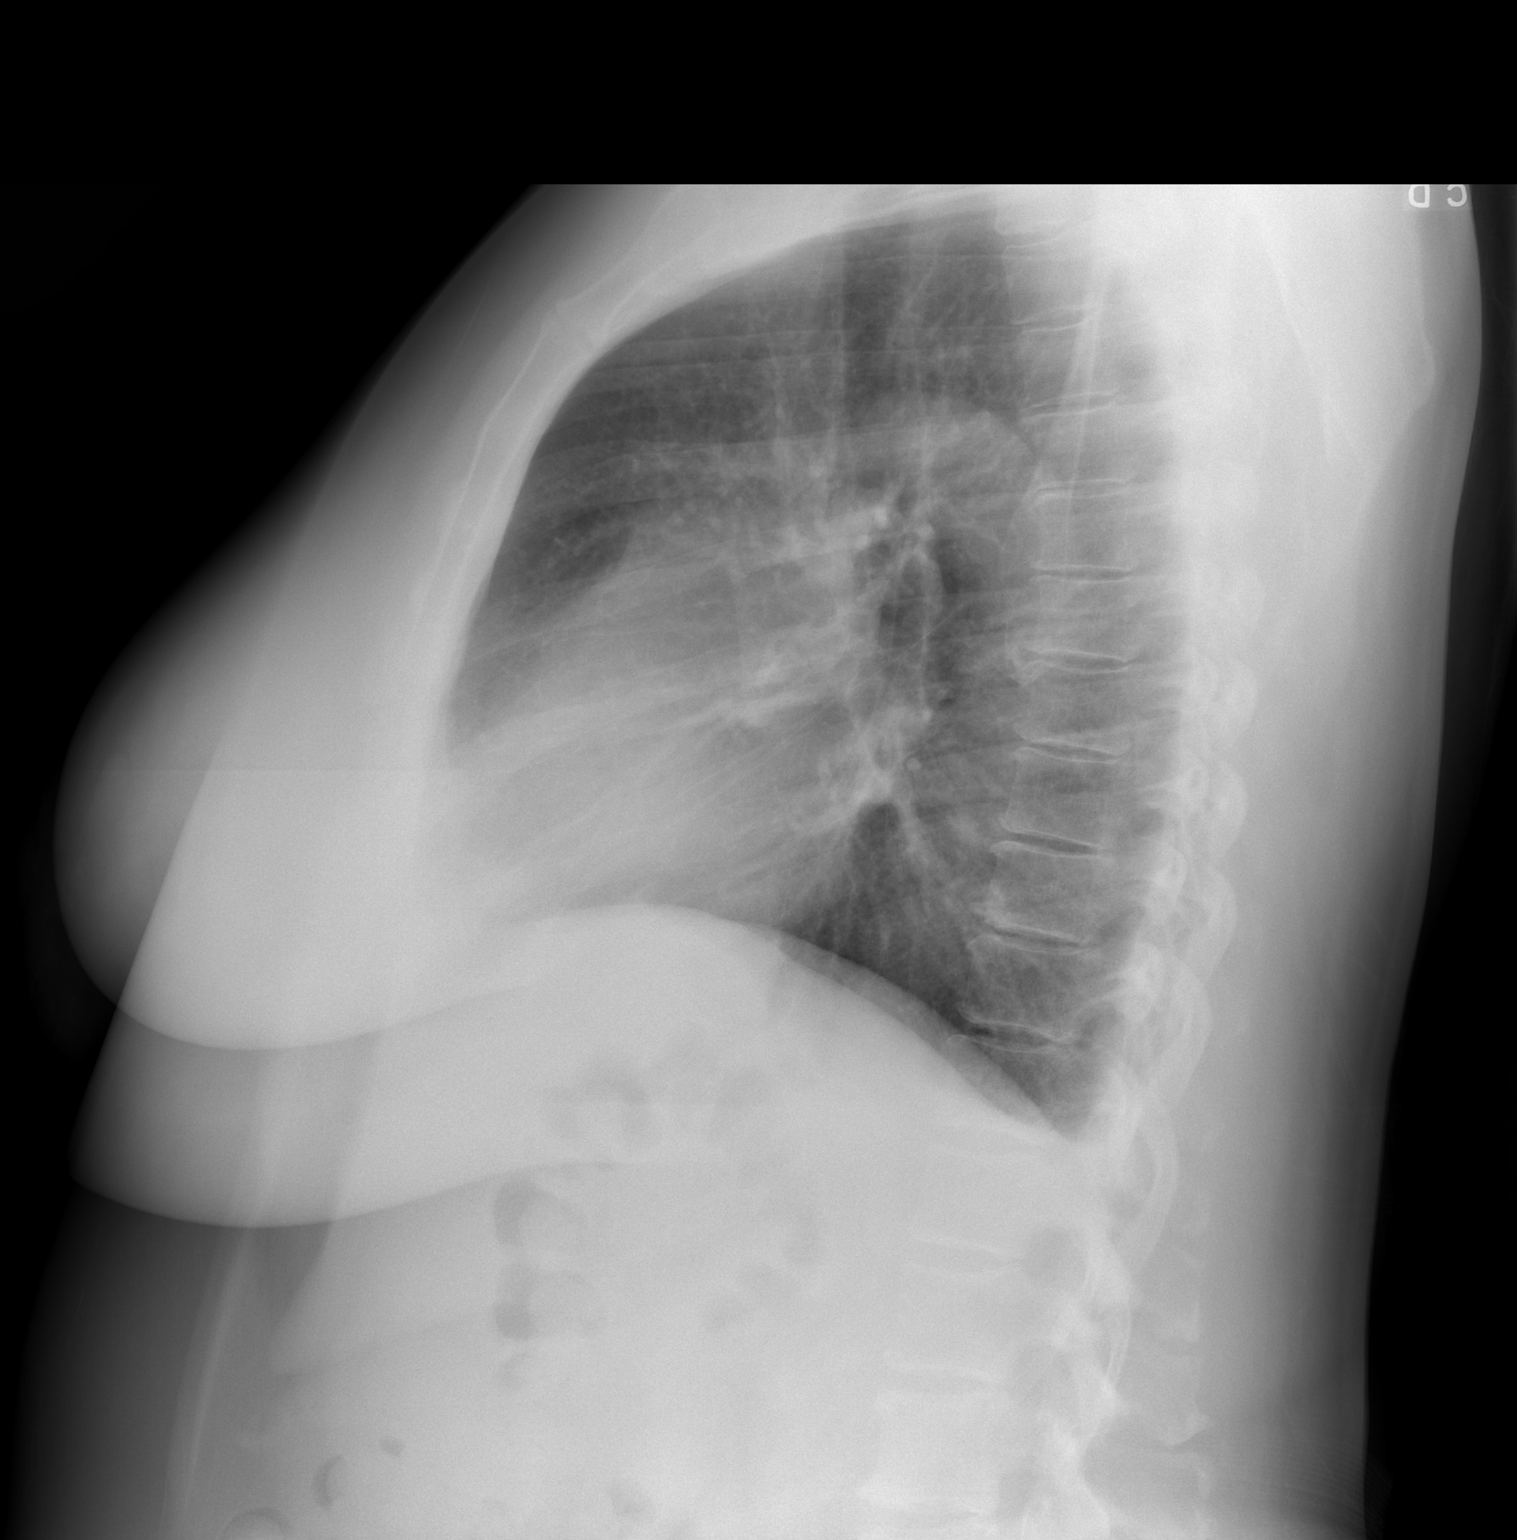

[2 of 2 positions shown; findings below may reference images not displayed]

FINDINGS: Lungs clear.  No pleural effusion.  Heart size normal.
IMPRESSION: No acute disease.

## 2011-09-13 ENCOUNTER — Emergency Department (HOSPITAL_COMMUNITY): Payer: No Typology Code available for payment source

## 2011-09-13 ENCOUNTER — Encounter (HOSPITAL_COMMUNITY): Payer: Self-pay

## 2011-09-13 ENCOUNTER — Emergency Department (HOSPITAL_COMMUNITY)
Admission: EM | Admit: 2011-09-13 | Discharge: 2011-09-13 | Disposition: A | Discharge status: Home / Self Care | Payer: No Typology Code available for payment source | Attending: Emergency Medicine | Admitting: Emergency Medicine

## 2011-09-13 DIAGNOSIS — I10 Essential (primary) hypertension: Secondary | ICD-10-CM | POA: Insufficient documentation

## 2011-09-13 DIAGNOSIS — J45909 Unspecified asthma, uncomplicated: Secondary | ICD-10-CM | POA: Insufficient documentation

## 2011-09-13 DIAGNOSIS — M79609 Pain in unspecified limb: Secondary | ICD-10-CM | POA: Insufficient documentation

## 2011-09-13 DIAGNOSIS — G4730 Sleep apnea, unspecified: Secondary | ICD-10-CM | POA: Insufficient documentation

## 2011-09-13 DIAGNOSIS — R079 Chest pain, unspecified: Secondary | ICD-10-CM | POA: Insufficient documentation

## 2011-09-13 DIAGNOSIS — Y92410 Unspecified street and highway as the place of occurrence of the external cause: Secondary | ICD-10-CM | POA: Insufficient documentation

## 2011-09-13 HISTORY — DX: Sleep apnea, unspecified: G47.30

## 2011-09-13 HISTORY — DX: Essential (primary) hypertension: I10

## 2011-09-13 MED ORDER — DIAZEPAM 5 MG PO TABS: 5.0000 mg | ORAL_TABLET | Freq: Three times a day (TID) | ORAL | Status: AC | PRN

## 2011-09-13 MED ORDER — IBUPROFEN 800 MG PO TABS: 800.0000 mg | ORAL_TABLET | Freq: Three times a day (TID) | ORAL | Status: AC | PRN

## 2011-09-13 MED ORDER — IBUPROFEN 800 MG PO TABS
800.0000 mg | ORAL_TABLET | Freq: Once | ORAL | Status: AC
  Administered 2011-09-13: 800 mg via ORAL
  Filled 2011-09-13: qty 1
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
## 2011-09-13 NOTE — ED Provider Notes (Signed)
Medical screening examination/treatment/procedure(s) were performed by non-physician practitioner and as supervising physician I was immediately available for consultation/collaboration.   Leigh-Ann Delvecchio Mad[place], MD 09/13/11 1948 

## 2011-10-17 ENCOUNTER — Other Ambulatory Visit: Payer: Self-pay | Admitting: Family Medicine

## 2011-10-17 DIAGNOSIS — Z1231 Encounter for screening mammogram for malignant neoplasm of breast: Secondary | ICD-10-CM

## 2011-10-17 DIAGNOSIS — R921 Mammographic calcification found on diagnostic imaging of breast: Secondary | ICD-10-CM

## 2011-11-13 ENCOUNTER — Ambulatory Visit
Admission: RE | Admit: 2011-11-13 | Discharge: 2011-11-13 | Disposition: A | Discharge status: Home / Self Care | Payer: No Typology Code available for payment source | Source: Ambulatory Visit | Attending: Family Medicine | Admitting: Family Medicine

## 2011-11-13 DIAGNOSIS — Z1231 Encounter for screening mammogram for malignant neoplasm of breast: Secondary | ICD-10-CM

## 2011-11-13 DIAGNOSIS — R921 Mammographic calcification found on diagnostic imaging of breast: Secondary | ICD-10-CM

## 2012-06-29 ENCOUNTER — Emergency Department (HOSPITAL_COMMUNITY)
Admission: EM | Admit: 2012-06-29 | Discharge: 2012-06-29 | Disposition: A | Discharge status: Home / Self Care | Payer: Medicaid Other | Attending: Emergency Medicine | Admitting: Emergency Medicine

## 2012-06-29 ENCOUNTER — Emergency Department (HOSPITAL_COMMUNITY): Payer: Medicaid Other

## 2012-06-29 ENCOUNTER — Encounter (HOSPITAL_COMMUNITY): Payer: Self-pay | Admitting: Emergency Medicine

## 2012-06-29 DIAGNOSIS — Z79899 Other long term (current) drug therapy: Secondary | ICD-10-CM | POA: Insufficient documentation

## 2012-06-29 DIAGNOSIS — J45909 Unspecified asthma, uncomplicated: Secondary | ICD-10-CM | POA: Insufficient documentation

## 2012-06-29 DIAGNOSIS — X500XXA Overexertion from strenuous movement or load, initial encounter: Secondary | ICD-10-CM | POA: Insufficient documentation

## 2012-06-29 DIAGNOSIS — I10 Essential (primary) hypertension: Secondary | ICD-10-CM | POA: Insufficient documentation

## 2012-06-29 DIAGNOSIS — R269 Unspecified abnormalities of gait and mobility: Secondary | ICD-10-CM | POA: Insufficient documentation

## 2012-06-29 DIAGNOSIS — Z9889 Other specified postprocedural states: Secondary | ICD-10-CM | POA: Insufficient documentation

## 2012-06-29 DIAGNOSIS — S8990XA Unspecified injury of unspecified lower leg, initial encounter: Secondary | ICD-10-CM | POA: Insufficient documentation

## 2012-06-29 DIAGNOSIS — Y929 Unspecified place or not applicable: Secondary | ICD-10-CM | POA: Insufficient documentation

## 2012-06-29 DIAGNOSIS — Y9301 Activity, walking, marching and hiking: Secondary | ICD-10-CM | POA: Insufficient documentation

## 2012-06-29 DIAGNOSIS — M25569 Pain in unspecified knee: Secondary | ICD-10-CM

## 2012-06-29 MED ORDER — HYDROCODONE-ACETAMINOPHEN 5-325 MG PO TABS: 2.0000 | ORAL_TABLET | ORAL | Status: DC | PRN

## 2012-06-29 NOTE — ED Provider Notes (Signed)
History     CSN: [id number]  Arrival date & time 06/29/12  2018   First MD Initiated Contact with Patient 06/29/12 2103      Chief Complaint  Patient presents with  . Leg Pain    (Consider location/radiation/quality/duration/timing/severity/associated sxs/prior treatment) Patient is a 57 y.o. female presenting with leg pain. The history is provided by the patient.  Leg Pain    patient's had left knee pain. She's previously had surgery for cartilage problems. She states she is able to catch himself and fall. Pain is worse with movement. She did not hurt anything else. She did not hit her head. She was also able to walk with a walker that she had from surgery.   Past Medical History  Diagnosis Date  . Asthma   . Hypertension   . Sleep apnea     Past Surgical History  Procedure Date  . Partial hysterectomy   . Knee surgery   . Cystectomy     No family history on file.  History  Substance Use Topics  . Smoking status: Never Smoker   . Smokeless tobacco: Not on file  . Alcohol Use: No    OB History    Grav Para Term Preterm Abortions TAB SAB Ect Mult Living                  Review of Systems  Respiratory: Negative for shortness of breath.   Cardiovascular: Negative for chest pain.  Musculoskeletal: Positive for gait problem. Negative for back pain and joint swelling.  Skin: Negative for pallor and rash.    Allergies  Iohexol and Penicillins  Home Medications   Current Outpatient Rx  Name  Route  Sig  Dispense  Refill  . ACYCLOVIR 200 MG PO CAPS   Oral   Take 200 mg by mouth every morning.         . ALBUTEROL SULFATE HFA 108 (90 BASE) MCG/ACT IN AERS   Inhalation   Inhale 2 puffs into the lungs every 6 (six) hours as needed.         Marland Kitchen AMLODIPINE BESY-BENAZEPRIL HCL 5-20 MG PO CAPS   Oral   Take 1 capsule by mouth every morning.         . ASPIRIN 81 MG PO CHEW   Oral   Chew 81 mg by mouth every morning.         Marland Kitchen VITAMIN D 1000 UNITS PO  TABS   Oral   Take 1,000 Units by mouth every morning.         Marland Kitchen FLUCONAZOLE 100 MG PO TABS   Oral   Take 100 mg by mouth once a week. Take on fridays         . FLUTICASONE-SALMETEROL 100-50 MCG/DOSE IN AEPB   Inhalation   Inhale 1 puff into the lungs every 12 (twelve) hours.         Marland Kitchen MONTELUKAST SODIUM 10 MG PO TABS   Oral   Take 10 mg by mouth every morning.         Marland Kitchen NAPHAZOLINE-PHENIRAMINE 0.025-0.3 % OP SOLN   Both Eyes   Place 1 drop into both eyes 2 (two) times daily as needed. For eye irritation         . PANTOPRAZOLE SODIUM 40 MG PO TBEC   Oral   Take 40 mg by mouth every morning.         Marland Kitchen SERTRALINE HCL 100 MG PO TABS   Oral  Take 100 mg by mouth every morning.         . TRIAMCINOLONE ACETONIDE 55 MCG/ACT NA INHA   Nasal   Place 2 sprays into the nose daily.         Marland Kitchen HYDROCODONE-ACETAMINOPHEN 5-325 MG PO TABS   Oral   Take 2 tablets by mouth every 4 (four) hours as needed for pain.   10 tablet   0     BP 112/73  Pulse 76  Temp 98.2 F (36.8 C) (Oral)  Resp 18  SpO2 96%  Physical Exam  Constitutional: She appears well-developed and well-nourished.  HENT:  Head: Normocephalic.  Cardiovascular: Normal rate.   Pulmonary/Chest: Effort normal.  Abdominal: There is no tenderness.  Musculoskeletal: Normal range of motion. She exhibits no edema and no tenderness.       Right knee stable. No effusion. Range of motion intact. Neurovascular intact distally. No tenderness over hip.  Neurological: She is alert.  Skin: Skin is warm.    ED Course  Procedures (including critical care time)  Labs Reviewed - No data to display Dg Knee Complete 4 Views Left  06/29/2012  *RADIOLOGY REPORT*  Clinical Data: 57 year old female with left knee pain.  LEFT KNEE - COMPLETE 4+ VIEW  Comparison: None  Findings: There is no evidence of acute fracture, subluxation or dislocation. 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 No focal bony  lesions are identified. There is no evidence of effusion.  IMPRESSION: No evidence of acute bony abnormality.  Moderate tricompartmental degenerative changes.   Original Report Authenticated By: Harmon Pier, M.D.      1. Knee pain       MDM  Patient with knee pain. Worse after twisting it. X-ray only shows degenerative changes. Will DC with ortho followup as needed. Patient was given an immobilizer and will use as needed.        Juliet Rude. Rubin Payor, MD 06/29/12 2231

## 2012-06-29 NOTE — ED Notes (Signed)
Pt c/o left knee pain, pt sts leg out out when walking with walker ,pt was able catch herself. Hx Knee surgury to remove cartilage.VSS

## 2012-06-29 NOTE — ED Notes (Signed)
Patient is alert and oriented x3.  She was given DC instructions and follow up visit instructions.  Patient gave verbal understanding. She was DC ambulatory under her own power to home.  V/S stable.  sHe was not showing any signs of distress on DC 

## 2012-09-14 ENCOUNTER — Emergency Department (HOSPITAL_COMMUNITY)
Admission: EM | Admit: 2012-09-14 | Discharge: 2012-09-14 | Disposition: A | Discharge status: Home / Self Care | Payer: Medicaid Other | Attending: Emergency Medicine | Admitting: Emergency Medicine

## 2012-09-14 DIAGNOSIS — Z79899 Other long term (current) drug therapy: Secondary | ICD-10-CM | POA: Insufficient documentation

## 2012-09-14 DIAGNOSIS — Z7982 Long term (current) use of aspirin: Secondary | ICD-10-CM | POA: Insufficient documentation

## 2012-09-14 DIAGNOSIS — J45909 Unspecified asthma, uncomplicated: Secondary | ICD-10-CM | POA: Insufficient documentation

## 2012-09-14 DIAGNOSIS — I10 Essential (primary) hypertension: Secondary | ICD-10-CM | POA: Insufficient documentation

## 2012-09-14 DIAGNOSIS — M79674 Pain in right toe(s): Secondary | ICD-10-CM

## 2012-09-14 DIAGNOSIS — M79609 Pain in unspecified limb: Secondary | ICD-10-CM | POA: Insufficient documentation

## 2012-09-14 DIAGNOSIS — IMO0002 Reserved for concepts with insufficient information to code with codable children: Secondary | ICD-10-CM | POA: Insufficient documentation

## 2012-09-14 DIAGNOSIS — R609 Edema, unspecified: Secondary | ICD-10-CM | POA: Insufficient documentation

## 2012-09-14 DIAGNOSIS — M7989 Other specified soft tissue disorders: Secondary | ICD-10-CM | POA: Insufficient documentation

## 2012-09-14 MED ORDER — OXYCODONE-ACETAMINOPHEN 5-325 MG PO TABS: 1.0000 | ORAL_TABLET | Freq: Four times a day (QID) | ORAL | Status: DC | PRN

## 2012-09-14 MED ORDER — PREDNISONE 20 MG PO TABS: ORAL_TABLET | ORAL | Status: DC

## 2012-09-14 NOTE — ED Provider Notes (Signed)
History    This chart was scribed for non-physician practitioner working with Nelia Shi, MD by Frederik Pear, ED Scribe. This patient was seen in room WTR7/WTR7 and the patient's care was started at 1951.   CSN: [id number]  Arrival date & time 09/14/12  1945   First MD Initiated Contact with Patient 09/14/12 1951      Chief Complaint  Patient presents with  . Foot Pain    (Consider location/radiation/quality/duration/timing/severity/associated sxs/prior treatment) The history is provided by the patient and medical records. No language interpreter was used.    Amber Wong is a 57 y.o. female who presents to the Emergency Department complaining of gradual onset, persistent, non-radiating, throbbing right great toe pain with associated swelling and erythema that is aggravated by walking and movement of the toe and alleviated by nothing that began 6 days ago without injury. She denies any h/o of similar symptoms, gout, or kidney problems. She denies any associated fever, nausea, emesis, or diarrhea. She reports that she has been treating the pain with hydrocodone, tramadol, rubbing the area with Bengay and alcohol, and soaking her foot in Epsom salt with no relief. She has a h/o of hypertension since 2006. She has a h/o of surgery in her left knee and states that her surgeon told her that she would develop arthritis in the joint at some point in the future.   PCP is Dr. Tally Joe.  Past Medical History  Diagnosis Date  . Asthma   . Hypertension   . Sleep apnea     Past Surgical History  Procedure Laterality Date  . Partial hysterectomy    . Knee surgery    . Cystectomy      No family history on file.  History  Substance Use Topics  . Smoking status: Never Smoker   . Smokeless tobacco: Not on file  . Alcohol Use: No    OB History   Grav Para Term Preterm Abortions TAB SAB Ect Mult Living                  Review of Systems  Constitutional: Negative for  fever.  HENT: Negative for congestion and sore throat.   Respiratory: Negative for shortness of breath.   Gastrointestinal: Negative for nausea, vomiting and diarrhea.  Genitourinary: Negative for dysuria.  Musculoskeletal: Positive for arthralgias (right great toe).  Skin: Negative for rash.  Neurological: Negative for headaches.    Allergies  Iohexol and Penicillins  Home Medications   Current Outpatient Rx  Name  Route  Sig  Dispense  Refill  . acyclovir (ZOVIRAX) 200 MG capsule   Oral   Take 200 mg by mouth every morning.         Marland Kitchen albuterol (PROVENTIL HFA;VENTOLIN HFA) 108 (90 BASE) MCG/ACT inhaler   Inhalation   Inhale 2 puffs into the lungs every 6 (six) hours as needed.         Marland Kitchen amLODipine-benazepril (LOTREL) 5-20 MG per capsule   Oral   Take 1 capsule by mouth every morning.         Marland Kitchen aspirin 81 MG chewable tablet   Oral   Chew 81 mg by mouth every morning.         . cholecalciferol (VITAMIN D) 1000 UNITS tablet   Oral   Take 1,000 Units by mouth every morning.         . fluconazole (DIFLUCAN) 100 MG tablet   Oral   Take 100 mg by mouth once  a week. Take on fridays         . Fluticasone-Salmeterol (ADVAIR) 100-50 MCG/DOSE AEPB   Inhalation   Inhale 1 puff into the lungs every 12 (twelve) hours.         Marland Kitchen HYDROcodone-acetaminophen (NORCO/VICODIN) 5-325 MG per tablet   Oral   Take 2 tablets by mouth every 4 (four) hours as needed for pain.   10 tablet   0   . montelukast (SINGULAIR) 10 MG tablet   Oral   Take 10 mg by mouth every morning.         . naphazoline-pheniramine (NAPHCON-A) 0.025-0.3 % ophthalmic solution   Both Eyes   Place 1 drop into both eyes 2 (two) times daily as needed. For eye irritation         . pantoprazole (PROTONIX) 40 MG tablet   Oral   Take 40 mg by mouth every morning.         . sertraline (ZOLOFT) 100 MG tablet   Oral   Take 100 mg by mouth every morning.         . triamcinolone (NASACORT) 55  MCG/ACT nasal inhaler   Nasal   Place 2 sprays into the nose daily.           BP 136/80  Pulse 79  Temp(Src) 98.5 F (36.9 C) (Oral)  Resp 17  SpO2 99%  Physical Exam  Nursing note and vitals reviewed. Constitutional: She appears well-developed and well-nourished. No distress.  HENT:  Head: Normocephalic and atraumatic.  Eyes: Conjunctivae are normal.  Neck: Normal range of motion. Neck supple.  Pulmonary/Chest: Effort normal. No respiratory distress.  Abdominal: There is no tenderness.  Musculoskeletal: She exhibits edema and tenderness.  Pain with movement of the toe. Erythema, swelling, and warmth over the MTP joint of the right great toe.  Neurological: She is alert. Coordination normal.  Skin: She is not diaphoretic.  Psychiatric: She has a normal mood and affect. Her behavior is normal. Judgment and thought content normal.    ED Course  Procedures (including critical care time)  DIAGNOSTIC STUDIES: Oxygen Saturation is 99% on room air, normal by my interpretation.    COORDINATION OF CARE:  20:03- Discussed planned course of treatment with the patient, including Deltasone, Percocet, and following up with her PCP within the next week, who is agreeable at this time.  Labs Reviewed - No data to display No results found.   1. Toe pain, right    Patient seen and examined.   Vital signs reviewed and are as follows: Filed Vitals:   09/14/12 2000  BP: 136/80  Pulse: 79  Temp: 98.5 F (36.9 C)  Resp: 17   Will give treatment for gout and have patient followup closely with PCP this week.  The patient was urged to return to the Emergency Department urgently with worsening pain, swelling, expanding erythema especially if it streaks away from the affected area, fever, or if they have any other concerns. Patient verbalized understanding.   Patient counseled on use of narcotic pain medications. Counseled not to combine these medications with others containing  tylenol. Urged not to drink alcohol, drive, or perform any other activities that requires focus while taking these medications. The patient verbalizes understanding and agrees with the plan.  No apparent indications to prednisone. No diabetes. Last renal function was normal.   MDM  Patient with your edema and swelling of the MTP joint of the right great toe. Given pain with movement of toe  suspect this represents gout. Less likely cellulitis overlying this area. There is no lymphangitis or spread of infection. No systemic symptoms of illness. Patient appears well, nontoxic. She has primary care physician followup.   I personally performed the services described in this documentation, which was scribed in my presence. The recorded information has been reviewed and is accurate.        Renne Crigler, PA-C 09/14/12 2145

## 2012-09-14 NOTE — ED Notes (Signed)
Pt c/o R foot swelling and pain since Monday. Pt denies trauma to foot. States she has soaked foot in Epsom salts and taken Vicodin and Tramadol for pain, but nothing is helping. Pt has swelling to R great toe and top of R foot. Area warm to touch. Pt ambulatory to exam room with steady gait. Pt states she has a ride home.

## 2012-09-15 NOTE — ED Provider Notes (Signed)
Medical screening examination/treatment/procedure(s) were performed by non-physician practitioner and as supervising physician I was immediately available for consultation/collaboration.    Keisi Eckford L Cheronda Erck, MD 09/15/12 1549 

## 2012-12-16 ENCOUNTER — Encounter: Payer: Self-pay | Admitting: Neurology

## 2012-12-18 ENCOUNTER — Other Ambulatory Visit: Payer: Self-pay | Admitting: Neurology

## 2012-12-18 DIAGNOSIS — G4733 Obstructive sleep apnea (adult) (pediatric): Secondary | ICD-10-CM

## 2012-12-18 NOTE — Progress Notes (Signed)
Quick Note:  There is no 95% pressure noted, this patient is compliant with CPAP and has a low AHI, machine is set a to 6 cm water.  Needs new machine because the old one was not Designer, industrial/product. Split study ordered, but patient had compliance issues in the past-   Medicaid may not pay if she she is not willing to use the machine 4 hours or more from now on. . ______

## 2012-12-19 ENCOUNTER — Other Ambulatory Visit: Payer: Self-pay | Admitting: Neurology

## 2012-12-19 DIAGNOSIS — G4733 Obstructive sleep apnea (adult) (pediatric): Secondary | ICD-10-CM

## 2012-12-26 ENCOUNTER — Encounter: Payer: Self-pay | Admitting: Neurology

## 2012-12-27 ENCOUNTER — Ambulatory Visit (INDEPENDENT_AMBULATORY_CARE_PROVIDER_SITE_OTHER): Payer: Medicaid Other | Admitting: Neurology

## 2012-12-27 ENCOUNTER — Encounter: Payer: Self-pay | Admitting: Neurology

## 2012-12-27 VITALS — BP 139/92 | HR 72 | Resp 18 | Ht 67.0 in | Wt 236.0 lb

## 2012-12-27 DIAGNOSIS — G4733 Obstructive sleep apnea (adult) (pediatric): Secondary | ICD-10-CM

## 2012-12-27 HISTORY — DX: Obstructive sleep apnea (adult) (pediatric): G47.33

## 2012-12-27 NOTE — Addendum Note (Signed)
Addended by: Melvyn Novas on: 12/27/2012 10:59 AM   Modules accepted: Orders

## 2012-12-27 NOTE — Progress Notes (Signed)
[facility]  Provider:  Dr Alliana Mcauliff Referring Provider: Marva Panda, NP Primary Care Physician:  Egbert Garibaldi, NP  Chief Complaint  Patient presents with  . Sleep / Cpap    # 10  Revisit    HPI:  Amber Wong is a 57 y.o. female here as a referral from Dr. Fredrik Cove for followup on apnea.  I met this right-handed, morbidly obese ,  Native [place] and [place] American female patient upon referral by Dr. Wille Celeste on 05-17-12.  ,  2 grandparents belonged to the [place] tribe , 1 blackfoot [place] and african , one  [place] .   The patient had a previous sleep study in February 2013 was diagnosed with sleep apnea, but did not follow up- . and her AHI was 15.5.  Her sleep study was interpreted as showing complex apnea partially because she hasn't asthma-COPD history ulcerative very high Epworth sleepiness score of 23/24 points prior to her study please note that the patient's REM AHI was 54 versus a non-REM AHI of 5.2 but she spent almost half of her diagnostic study in supine position the supine AHI was 11 the nonsupine 19.9 he had 4 obstructive apneas 13 central apneas and 63 hypotony is. She also had 266 minutes of desaturations with a nadir at 76% is a titration the medial rules to the lowest at 87% on the 26th minutes of desaturation time for an altered at the residual AHI was 0.4 the patient also was changed to a nasal swift pillows from a former fullface mask. Please note that the very first time this patient was diagnosed with apnea was in July 2008 an outside facility but she was titrated to 11 cm CPAP and felt not refreshed.  Her BMI at the time was 37.5 and her neck circumference 17.5 inches her BMI now is 40.08 her neck circumference is 16.75 inches at Epworth 15 - Her oxygen nadir went as low as 76%. Her EKG doing the sleep study showed PVCs.   This the CPAP machine broke down  prior to our visit and the patient reported that advanced on care was  unable to repair it or obtain any data the machine is 57 years old.  We have now 15 days of download data. The patient was furnished with a loaner machine upon my request and was using the machine for 30 days with good compliance and 6 hours and a half , her residual AHI is 1.7 .  Her main risk factor remains her high BMI. Underwent a tonsillectomy the neck or facial or jaw trauma or surgeries.  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 Regular sleep time is between 11 PM and 6 to 7 AM. She denies any alcoholic beverages and she is fasting in Ramadan. The patient had a lot of nocturia and she used her on no identified is broken, machine. On the on her machine her nocturia disappeared she was better rested slept through the night and averaged between 6 and 7 hours of nocturnal uninterrupted sleep.  Family history is positive OSA in her mother.   The problem that arose from the patient's ALT machine was that it indicated noncompliance when the patient was actually using is faithfully. The data were not retrievable neither by [place] not to DME. I am using this visit with the patient to verify in her medical record that she is compliant with the use of CPAP  at 6 cm water and that she should be getting a new fully functioning machine.    The patient's machine is set at 6 cm water. I would like for her to get a new CPAP machine , as her own is technically no longer able to give therapeutic data and compliance data.   Review of Systems: Out of a complete 14 system review, the patient complains of only the following symptoms, and all other reviewed systems are negative. Improved sleepiness, Nocturia on 6 cm water CPAP loaner machine.  Weight is stable - but she wants to lose.  Patient is fatigued , but is fasting for Ramadan.   History   Social History  . Marital Status: Married    Spouse Name: N/A    Number of Children: N/A  . Years of  Education: 12   Occupational History  .      history of shift work   Social History Main Topics  . Smoking status: Former Smoker    Types: Cigarettes  . Smokeless tobacco: Never Used     Comment: Quit 2005  . Alcohol Use: No  . Drug Use: No  . Sexually Active: Not on file   Other Topics Concern  . Not on file   Social History Narrative   Patient lives at home alone.     Family History  Problem Relation Age of Onset  . Cancer Sister     double mastectomy  . Heart attack Sister     2 sisters    Past Medical History  Diagnosis Date  . Asthma   . Hypertension   . Sleep apnea   . Nocturia   . Depression   . Morbid obesity   . OSA (obstructive sleep apnea)     w/ hypoventilation  . Hypercholesteremia     Past Surgical History  Procedure Laterality Date  . Partial hysterectomy    . Knee surgery      chronic pain and limping since  . Cystectomy    . Breast lumpectomy Right     benign at age 89  . Tonsillectomy      Current Outpatient Prescriptions  Medication Sig Dispense Refill  . acyclovir (ZOVIRAX) 200 MG capsule Take 200 mg by mouth every morning.      Marland Kitchen albuterol (PROVENTIL HFA;VENTOLIN HFA) 108 (90 BASE) MCG/ACT inhaler Inhale 2 puffs into the lungs every 6 (six) hours as needed.      Marland Kitchen amLODipine-benazepril (LOTREL) 5-20 MG per capsule Take 1 capsule by mouth every morning.      Marland Kitchen aspirin 81 MG chewable tablet Chew 81 mg by mouth every morning.      . cholecalciferol (VITAMIN D) 1000 UNITS tablet Take 1,000 Units by mouth every morning.      . Fluticasone-Salmeterol (ADVAIR) 100-50 MCG/DOSE AEPB Inhale 1 puff into the lungs every 12 (twelve) hours.      Marland Kitchen HYDROcodone-acetaminophen (NORCO/VICODIN) 5-325 MG per tablet Take 2 tablets by mouth every 4 (four) hours as needed for pain.  10 tablet  0  . montelukast (SINGULAIR) 10 MG tablet Take 10 mg by mouth every morning.      Marland Kitchen oxyCODONE-acetaminophen (PERCOCET/ROXICET) 5-325 MG per tablet Take 1-2 tablets  by mouth every 6 (six) hours as needed for pain.  10 tablet  0  . pantoprazole (PROTONIX) 40 MG tablet Take 40 mg by mouth every morning.      Marland Kitchen PARoxetine (PAXIL) 20 MG tablet Take 20 mg by mouth every morning.      Marland Kitchen  predniSONE (DELTASONE) 20 MG tablet 3 Tabs PO Days 1-3, then 2 tabs PO Days 4-6, then 1 tab PO Day 7-9, then Half Tab PO Day 10-12  20 tablet  0   No current facility-administered medications for this visit.    Allergies as of 12/27/2012 - Review Complete 12/27/2012  Allergen Reaction Noted  . Iohexol  08/25/2009  . Penicillins  05/14/2007    Vitals: BP 139/92  Pulse 72  Resp 18  Ht 5\' 7"  (1.702 m)  Wt 236 lb (107.049 kg)  BMI 36.95 kg/m2 Last Weight:  Wt Readings from Last 1 Encounters:  12/27/12 236 lb (107.049 kg)   Last Height:   Ht Readings from Last 1 Encounters:  12/27/12 5\' 7"  (1.702 m)     Physical exam:  General: The patient is drowsy  and appears not in acute distress. The patient is well groomed. Head: Normocephalic, atraumatic. Neck is supple. Mallampati 4 , neck circumference:16,75  Cardiovascular:  Regular rate and rhythm, without  murmurs or carotid bruit, and without distended neck veins. Respiratory: Lungs are clear to auscultation. Skin:  Without evidence of edema, or rash Trunk: BMI is elevated ,patient  has normal posture.  Neurologic exam : The patient is awake and alert, oriented to place and time.  Memory subjective  described as impaired . There is a decreased attention span & concentration ability.  Speech is fluent without  dysarthria, dysphonia or aphasia. Mood and affect are appropriate.  Cranial nerves: Pupils are equal and briskly reactive to light. Funduscopic exam without  evidence of pallor or edema. Extraocular movements  in vertical and horizontal planes intact and without nystagmus. Visual fields by finger perimetry are intact. Hearing to finger rub intact.  Facial sensation intact to fine touch. Facial motor strength  is symmetric and tongue and uvula move midline.  Motor exam:   Normal tone and normal muscle bulk and symmetric normal strength in all extremities.  Sensory:  Fine touch, pinprick and vibration were tested in all extremities. Proprioception is  normal.  Coordination: Rapid alternating movements in the fingers/hands is tested and normal. Finger-to-nose maneuver  without evidence of ataxia, dysmetria or tremor.  Gait and station: Patient walks without assistive device, wide based . Strength within normal limits.  Deep tendon reflexes: in the  upper and lower extremities are symmetric and intact. Babinski maneuver response isdowngoing.   Assessment:  After physical and neurologic examination, review of laboratory studies, imaging, neurophysiology testing and pre-existing records, assessment will be reviewed on the problem list.   BMI - morbid obesity without CO2 retention during the sleep study .  2 and 5 diet discussed, carbohydrate reduction, exercise discussed.   OSA risk factors and  Alternatives to CPAP discussed,  OSA as a risk factor for CAD, CVA.  Asthma is a concern during allergy season. Rhinitis , too. She has antihistamine medications and aerosoles/ inhaler .   Plan:  Treatment plan and additional workup will be reviewed under Problem List.  Prescribe CPAP S 9 for the patient , she needs a new machine  . Patient is not longer using any narcotic pain medications.  Epworth 24 - now 12

## 2012-12-27 NOTE — Patient Instructions (Signed)
Weight lossSleep Apnea Sleep apnea is disorder that affects a person's sleep. A person with sleep apnea has abnormal pauses in their breathing when they sleep. It is hard for them to get a good sleep. This makes a person tired during the day. It also can lead to other physical problems. There are three types of sleep apnea. One type is when breathing stops for a short time because your airway is blocked (obstructive sleep apnea). Another type is when the brain sometimes fails to give the normal signal to breathe to the muscles that control your breathing (central sleep apnea). The third type is a combination of the other two types. HOME CARE  Do not sleep on your back. Try to sleep on your side.  Take all medicine as told by your doctor.  Avoid alcohol, calming medicines (sedatives), and depressant drugs.  Try to lose weight if you are overweight. Talk to your doctor about a healthy weight goal. Your doctor may have you use a device that helps to open your airway. It can help you get the air that you need. It is called a positive airway pressure (PAP) device. There are three types of PAP devices:  Continuous positive airway pressure (CPAP) device.  Nasal expiratory positive airway pressure (EPAP) device.  Bilevel positive airway pressure (BPAP) device. MAKE SURE YOU:  Understand these instructions.  Will watch your condition.  Will get help right away if you are not doing well or get worse. Document Released: 03/07/2008 Document Revised: 05/15/2012 Document Reviewed: 09/30/2011 [facility] Patient Information 2014 [place], [place]. Exercise to Lose Weight Exercise and a healthy diet may help you lose weight. Your doctor may suggest specific exercises. EXERCISE IDEAS AND TIPS  Choose low-cost things you enjoy doing, such as walking, bicycling, or exercising to workout videos.  Take stairs instead of the elevator.  Walk during your lunch break.  Park your car further away from work or  school.  Go to a gym or an exercise class.  Start with 5 to 10 minutes of exercise each day. Build up to 30 minutes of exercise 4 to 6 days a week.  Wear shoes with good support and comfortable clothes.  Stretch before and after working out.  Work out until you breathe harder and your heart beats faster.  Drink extra water when you exercise.  Do not do so much that you hurt yourself, feel dizzy, or get very short of breath. Exercises that burn about 150 calories:  Running 1  miles in 15 minutes.  Playing volleyball for 45 to 60 minutes.  Washing and waxing a car for 45 to 60 minutes.  Playing touch football for 45 minutes.  Walking 1  miles in 35 minutes.  Pushing a stroller 1  miles in 30 minutes.  Playing basketball for 30 minutes.  Raking leaves for 30 minutes.  Bicycling 5 miles in 30 minutes.  Walking 2 miles in 30 minutes.  Dancing for 30 minutes.  Shoveling snow for 15 minutes.  Swimming laps for 20 minutes.  Walking up stairs for 15 minutes.  Bicycling 4 miles in 15 minutes.  Gardening for 30 to 45 minutes.  Jumping rope for 15 minutes.  Washing windows or floors for 45 to 60 minutes. Document Released: 07/01/2010 Document Revised: 08/21/2011 Document Reviewed: 07/01/2010 [facility] Patient Information 2014 [place], [place].

## 2012-12-31 ENCOUNTER — Encounter: Payer: Self-pay | Admitting: Neurology

## 2013-01-02 ENCOUNTER — Ambulatory Visit (INDEPENDENT_AMBULATORY_CARE_PROVIDER_SITE_OTHER): Payer: Medicaid Other | Admitting: Neurology

## 2013-01-02 DIAGNOSIS — G2581 Restless legs syndrome: Secondary | ICD-10-CM

## 2013-01-02 DIAGNOSIS — G4733 Obstructive sleep apnea (adult) (pediatric): Secondary | ICD-10-CM

## 2013-01-10 ENCOUNTER — Telehealth: Payer: Self-pay | Admitting: *Deleted

## 2013-01-10 DIAGNOSIS — J449 Chronic obstructive pulmonary disease, unspecified: Secondary | ICD-10-CM

## 2013-01-10 DIAGNOSIS — G4733 Obstructive sleep apnea (adult) (pediatric): Secondary | ICD-10-CM

## 2013-01-10 NOTE — Telephone Encounter (Signed)
patient called back to discuss sleep study results.  Discussed findings, recommendations and follow up care.  Patient understood well and all questions were answered.   Pt chose to have a new CPAP Titration because she would like to avoid oxygen therapy if at all possible.  She also feels like she sleeps better on CPAP - she is currently using a loaner machine from [facility].  CPAP Titration scheduled for 01/20/13 at 8:00 PM.  If O2 is needed after obstructive issues are resolved, tech has been given instructions to add O2 therapy that night.  Copy of report will be mailed to pt, copy of report will be faxed to [facility].

## 2013-01-13 ENCOUNTER — Other Ambulatory Visit: Payer: Self-pay | Admitting: Neurology

## 2013-01-13 DIAGNOSIS — G4710 Hypersomnia, unspecified: Secondary | ICD-10-CM

## 2013-01-15 ENCOUNTER — Other Ambulatory Visit: Payer: Self-pay | Admitting: Neurology

## 2013-01-15 DIAGNOSIS — G4710 Hypersomnia, unspecified: Secondary | ICD-10-CM

## 2013-01-16 ENCOUNTER — Telehealth: Payer: Self-pay | Admitting: *Deleted

## 2013-01-16 NOTE — Telephone Encounter (Signed)
Message copied by Daryll Drown on Thu Jan 16, 2013  2:09 PM ------      Message from: [employer]      Created: Thu Jan 16, 2013 11:38 AM      Regarding: Return call       Pt called to request a call back from you as soon as possible -       [phone]. ------

## 2013-01-16 NOTE — Telephone Encounter (Signed)
Left message for patient, returning her call.  Told her I will be here until, asked her to call me back today. -sh

## 2013-01-20 ENCOUNTER — Ambulatory Visit (INDEPENDENT_AMBULATORY_CARE_PROVIDER_SITE_OTHER): Payer: Medicaid Other | Admitting: Neurology

## 2013-01-20 DIAGNOSIS — G4730 Sleep apnea, unspecified: Secondary | ICD-10-CM

## 2013-01-20 DIAGNOSIS — G4761 Periodic limb movement disorder: Secondary | ICD-10-CM

## 2013-01-20 DIAGNOSIS — G4733 Obstructive sleep apnea (adult) (pediatric): Secondary | ICD-10-CM

## 2013-02-11 ENCOUNTER — Telehealth: Payer: Self-pay | Admitting: Neurology

## 2013-02-11 DIAGNOSIS — G4733 Obstructive sleep apnea (adult) (pediatric): Secondary | ICD-10-CM

## 2013-02-11 NOTE — Telephone Encounter (Signed)
Called patient to discuss sleep study results.  Discussed findings, recommendations and follow up care.  Patient understood well and all questions were answered.   PATIENT IS AWARE THAT AN ORDER WILL BE FORWARDED TO [facility] WHO IS HELPING HER GET A MACHINE COVERED.  I explained to her that if for some reason she was not able to get a machine, then we can donate an older machine that hasn't been used much.

## 2013-02-12 ENCOUNTER — Telehealth: Payer: Self-pay | Admitting: Neurology

## 2013-02-12 NOTE — Telephone Encounter (Signed)
Called patient back, she asked me if I could get Dr. Vickey Huger to write her a letter excusing her from jury duty on 03/10/13 because she falls asleep while sitting still.  She said they told her if she had a doctor's note, she could be excused based upon her inability to pay attention and stay awake.  I told her I can forward this information to Dr. Vickey Huger and ask her and then I can let her know what she says.

## 2013-02-13 ENCOUNTER — Encounter: Payer: Self-pay | Admitting: *Deleted

## 2013-02-13 NOTE — Telephone Encounter (Signed)
Please write jury duty excuse based on medical/ neurological condition.

## 2013-02-28 ENCOUNTER — Encounter: Payer: Self-pay | Admitting: Neurology

## 2013-02-28 NOTE — Telephone Encounter (Signed)
Letter written, patient will bring in jury summons on Monday 03-03-13.

## 2013-03-03 ENCOUNTER — Telehealth: Payer: Self-pay | Admitting: Neurology

## 2013-03-07 NOTE — Telephone Encounter (Signed)
Tried to return patient's call about jury duty letter status. Patient's phone was disconnected.

## 2013-03-17 ENCOUNTER — Other Ambulatory Visit: Payer: Self-pay

## 2013-03-17 DIAGNOSIS — Z1231 Encounter for screening mammogram for malignant neoplasm of breast: Secondary | ICD-10-CM

## 2013-03-28 ENCOUNTER — Encounter: Payer: Self-pay | Admitting: Neurology

## 2013-03-28 ENCOUNTER — Encounter (INDEPENDENT_AMBULATORY_CARE_PROVIDER_SITE_OTHER): Payer: Self-pay

## 2013-03-28 ENCOUNTER — Ambulatory Visit (INDEPENDENT_AMBULATORY_CARE_PROVIDER_SITE_OTHER): Payer: Medicaid Other | Admitting: Neurology

## 2013-03-28 VITALS — BP 117/79 | HR 73 | Resp 16 | Ht 67.0 in | Wt 233.0 lb

## 2013-03-28 DIAGNOSIS — G4733 Obstructive sleep apnea (adult) (pediatric): Secondary | ICD-10-CM

## 2013-03-28 DIAGNOSIS — G4734 Idiopathic sleep related nonobstructive alveolar hypoventilation: Secondary | ICD-10-CM

## 2013-03-28 DIAGNOSIS — R0902 Hypoxemia: Secondary | ICD-10-CM

## 2013-03-28 NOTE — Progress Notes (Signed)
[facility]  Provider:  Melvyn Novas, M D  Referring Provider: Marva Panda, NP Primary Care Physician:  Egbert Garibaldi, NP  Chief Complaint  Patient presents with  . Follow-up    Pt's sleep card was downloaded durring this visit    HPI:  Amber Wong is a 57 y.o. female  Is seen here as a referral/ revisit  from Dr. Fredrik Cove for  Sleep apnea follow up./ Compliance with CPAP.     She has no new interval diagnosis, hospitalizations, surgeries. There has been no change in her family medical history  Her review of systems is still by widely positive for various complaints. After my last visit with the patient, dated 12/27/2012 I have asked her to bring the machine today for a review and download. She had complained about nocturia by using the 6 cm water pressure on the CPAP and on the machine. Were sleepiness score had been reduced from 24-12 points she had also indicated that his longer using any narcotic pain medications.  She is using an  albuterol inhaler and one for the nasal Aerosol(Bexamethasone),  is also using the Advair disc. The patient had undergone a sleep study 2013 when she was diagnosed with an AHI of 15.5 but failed to follow up , and again in 2014 when she was diagnosed with a REM AHI as high as 54. The patient had a long a machine for 16 days, due to download air or it appeared initially as if she was noncompliant. Indeed the download is dated 12-12-2012 in the patient he was the machine 15/16 days compliance therefore it 100% average daily he was at 6 hours and 27 minutes. She is CMS compliance with them individually AHI of 1.7. CPAP was set at 6 cm water .  Download  in office dated 03-27-2013 shows a  CPAP  pressure of 10 cm water with an EPR of 2 cm water and average daily usage of 4 hours and 45 minutes ( CMS compliant )  and an AHI of 03 -She  still  Has some air leak, and the residual fatigue that the patient experiences may  be due to low oxygen levels which are not data included in the CPAP download.  Today  I order an overnight pulse oximetry while on CPAP for this patient.    Review of Systems: Out of a complete 14 system review, the patient complains of only the following symptoms, and all other reviewed systems are negative.  Fatigue FSS  63,  and Epworth 21   History   Social History  . Marital Status: Married    Spouse Name: N/A    Number of Children: N/A  . Years of Education: 12   Occupational History  .      history of shift work   Social History Main Topics  . Smoking status: Former Smoker    Types: Cigarettes  . Smokeless tobacco: Never Used     Comment: Quit 2005  . Alcohol Use: No  . Drug Use: No  . Sexual Activity: Not on file   Other Topics Concern  . Not on file   Social History Narrative   Patient lives at home alone.     Family History  Problem Relation Age of Onset  . Cancer Sister     double mastectomy  . Heart attack Sister     2 sisters    Past Medical History  Diagnosis Date  . Asthma   .  Hypertension   . Sleep apnea   . Nocturia   . Depression   . Morbid obesity   . OSA (obstructive sleep apnea)     w/ hypoventilation  . Hypercholesteremia   . Obstructive sleep apnea (adult) (pediatric) 12/27/2012    AHI 17.7 baseline, titrated to 6 cm , AHi now 2.4 - compliance     Past Surgical History  Procedure Laterality Date  . Partial hysterectomy    . Knee surgery      chronic pain and limping since  . Cystectomy    . Breast lumpectomy Right     benign at age 69  . Tonsillectomy      Current Outpatient Prescriptions  Medication Sig Dispense Refill  . acyclovir (ZOVIRAX) 200 MG capsule Take 200 mg by mouth every morning.      Marland Kitchen amLODipine-benazepril (LOTREL) 5-20 MG per capsule Take 1 capsule by mouth every morning.      Marland Kitchen aspirin 81 MG chewable tablet Chew 81 mg by mouth every morning.      . cholecalciferol (VITAMIN D) 1000 UNITS tablet Take  1,000 Units by mouth every morning.      . Fluticasone-Salmeterol (ADVAIR) 100-50 MCG/DOSE AEPB Inhale 1 puff into the lungs every 12 (twelve) hours.      . indomethacin (INDOCIN) 50 MG capsule Take 50 mg by mouth 2 (two) times daily with a meal.      . NASAL SALINE NA Place into the nose.      . pantoprazole (PROTONIX) 40 MG tablet Take 40 mg by mouth every morning.      Marland Kitchen PARoxetine (PAXIL) 20 MG tablet Take 20 mg by mouth every morning.      Marland Kitchen albuterol (PROVENTIL HFA;VENTOLIN HFA) 108 (90 BASE) MCG/ACT inhaler Inhale 2 puffs into the lungs every 6 (six) hours as needed.      Marland Kitchen oxyCODONE-acetaminophen (PERCOCET/ROXICET) 5-325 MG per tablet Take 1-2 tablets by mouth every 6 (six) hours as needed for pain.  10 tablet  0   No current facility-administered medications for this visit.    Allergies as of 03/28/2013 - Review Complete 03/28/2013  Allergen Reaction Noted  . Iohexol  08/25/2009  . Penicillins  05/14/2007    Vitals: BP 117/79  Pulse 73  Resp 16  Ht 5\' 7"  (1.702 m)  Wt 233 lb (105.688 kg)  BMI 36.48 kg/m2 Last Weight:  Wt Readings from Last 1 Encounters:  03/28/13 233 lb (105.688 kg)   Last Height:   Ht Readings from Last 1 Encounters:  03/28/13 5\' 7"  (1.702 m)   Physical exam:  General: The patient is drowsy and appears not in acute distress. The patient is well groomed.  Head: Normocephalic, atraumatic. Neck is supple. Mallampati 4 , neck circumference:16,75  Cardiovascular: Regular rate and rhythm, without murmurs or carotid bruit, and without distended neck veins.  Respiratory: Lungs are clear to auscultation.  Skin: Without evidence of edema, or rash  Trunk: BMI is elevated ,patient has normal posture.  Neurologic exam :  The patient is awake and alert, oriented to place and time. Memory subjective described as impaired . There is a decreased attention span & concentration ability.  Speech is fluent without dysarthria, dysphonia or aphasia. Mood and affect are  appropriate.  Cranial nerves:  Pupils are equal and briskly reactive to light. Funduscopic exam without evidence of pallor or edema. Extraocular movements in vertical and horizontal planes intact and without nystagmus. Visual fields by finger perimetry are intact.  Hearing  to finger rub intact. Facial sensation intact to fine touch. Facial motor strength is symmetric and tongue and uvula move midline.  Motor exam: Normal tone and normal muscle bulk and symmetric normal strength in all extremities.  Sensory: Fine touch, pinprick and vibration were tested in all extremities.  Proprioception is normal.  Coordination: Rapid alternating movements in the fingers/hands is tested and normal. Finger-to-nose maneuver without evidence of ataxia, dysmetria or tremor.  Gait and station: Patient walks without assistive device, wide based . Strength within normal limits.  Deep tendon reflexes: in the upper and lower extremities are symmetric and intact. Babinski maneuver response is downgoing.    Assessment: After physical and neurologic examination, review of laboratory studies, imaging, neurophysiology testing and pre-existing records, assessment : BMI - morbid obesity without CO2 retention during the sleep study .  2 and 5 diet discussed, carbohydrate reduction, exercise discussed.  OSA risk factors and Alternatives to CPAP discussed,  OSA as a risk factor for CAD, CVA.   Asthma/ COPD  is a concern during allergy season. Rhinitis , too. She has antihistamine medications and aerosoles/ inhaler .   Plan: Treatment plan and additional workup : Overnight ONO while on CPAP - epworth is too high for a CPAP compliant  Patient.  Prescribe CPAP  , she needs 10 cm water .  Depression is still evident.   Patient is not longer using any narcotic pain medications.

## 2013-04-03 ENCOUNTER — Encounter: Payer: Self-pay | Admitting: Neurology

## 2013-04-05 IMAGING — MG MM DIGITAL DIAGNOSTIC UNILAT*R*
3 series · 3 of 3 positions shown · non-contrast
Comparison: 11/11/2010 and earlier

CLINICAL DATA: Follow-up for nodule in the right breast.

DIGITAL DIAGNOSTIC RIGHT MAMMOGRAM WITH CAD

[R CC (1 of 2)]
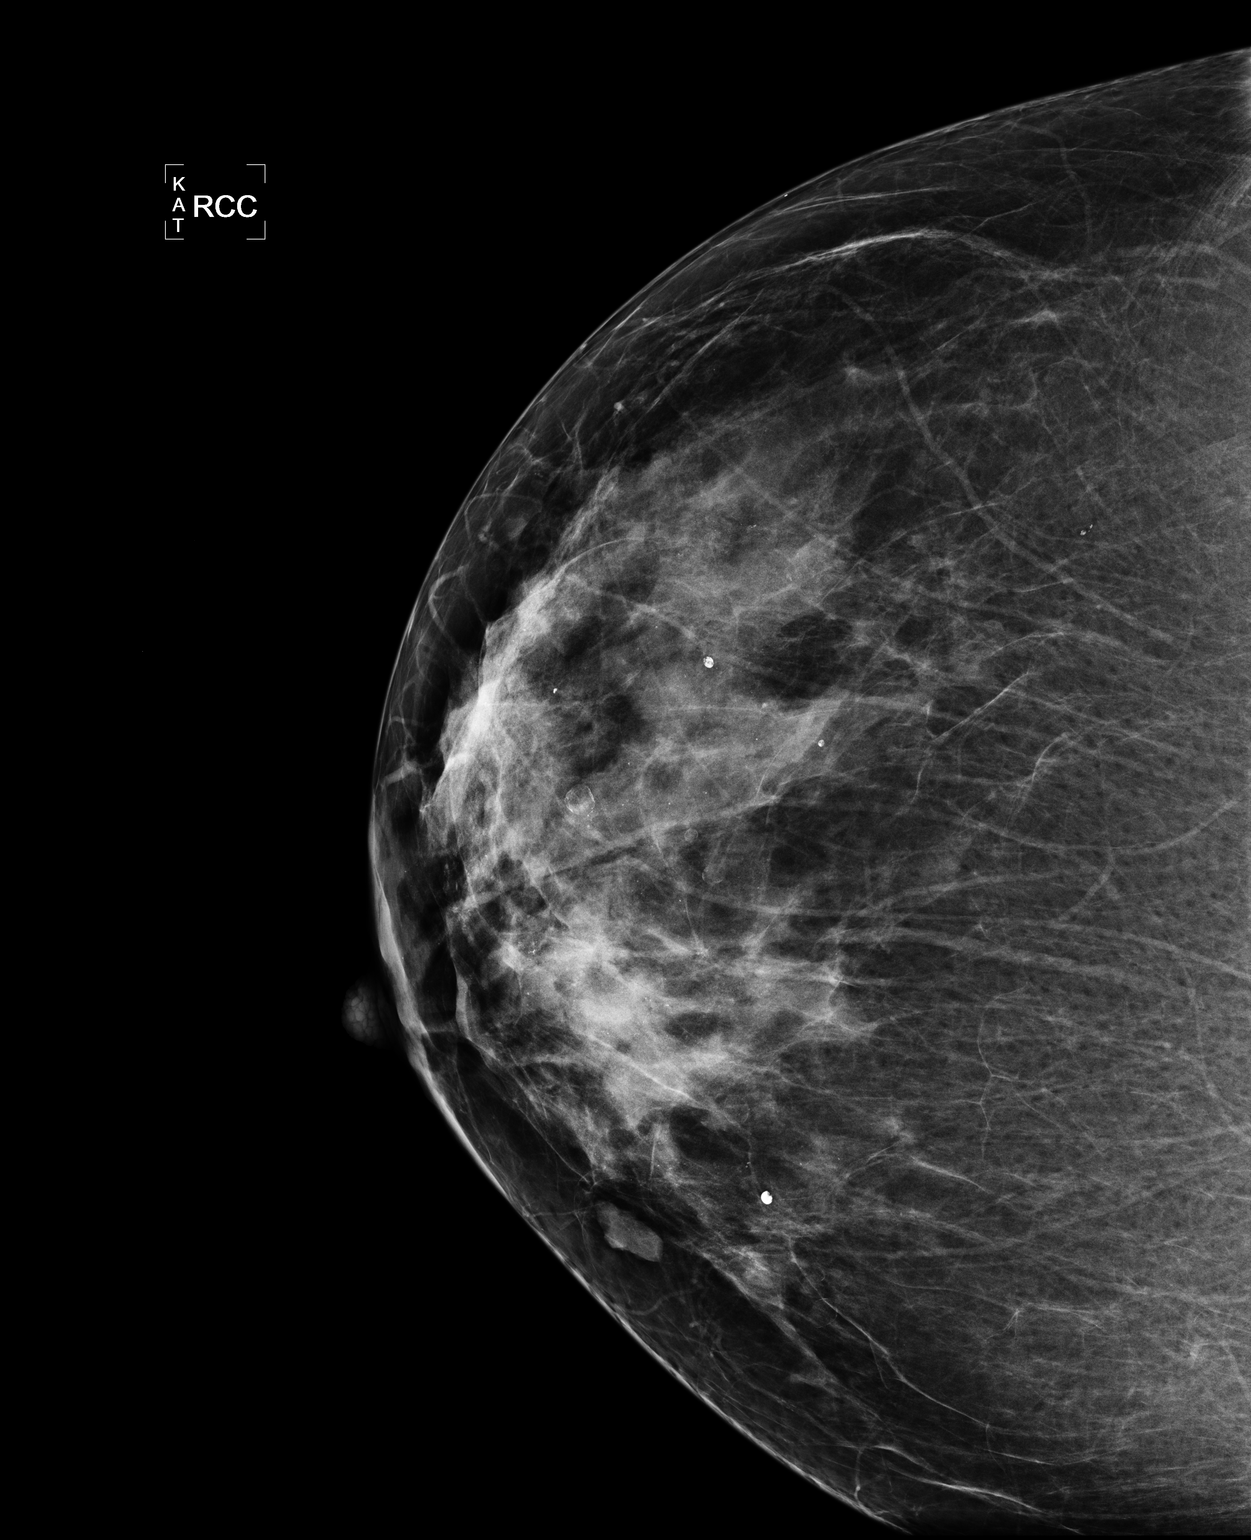

[R MLO]
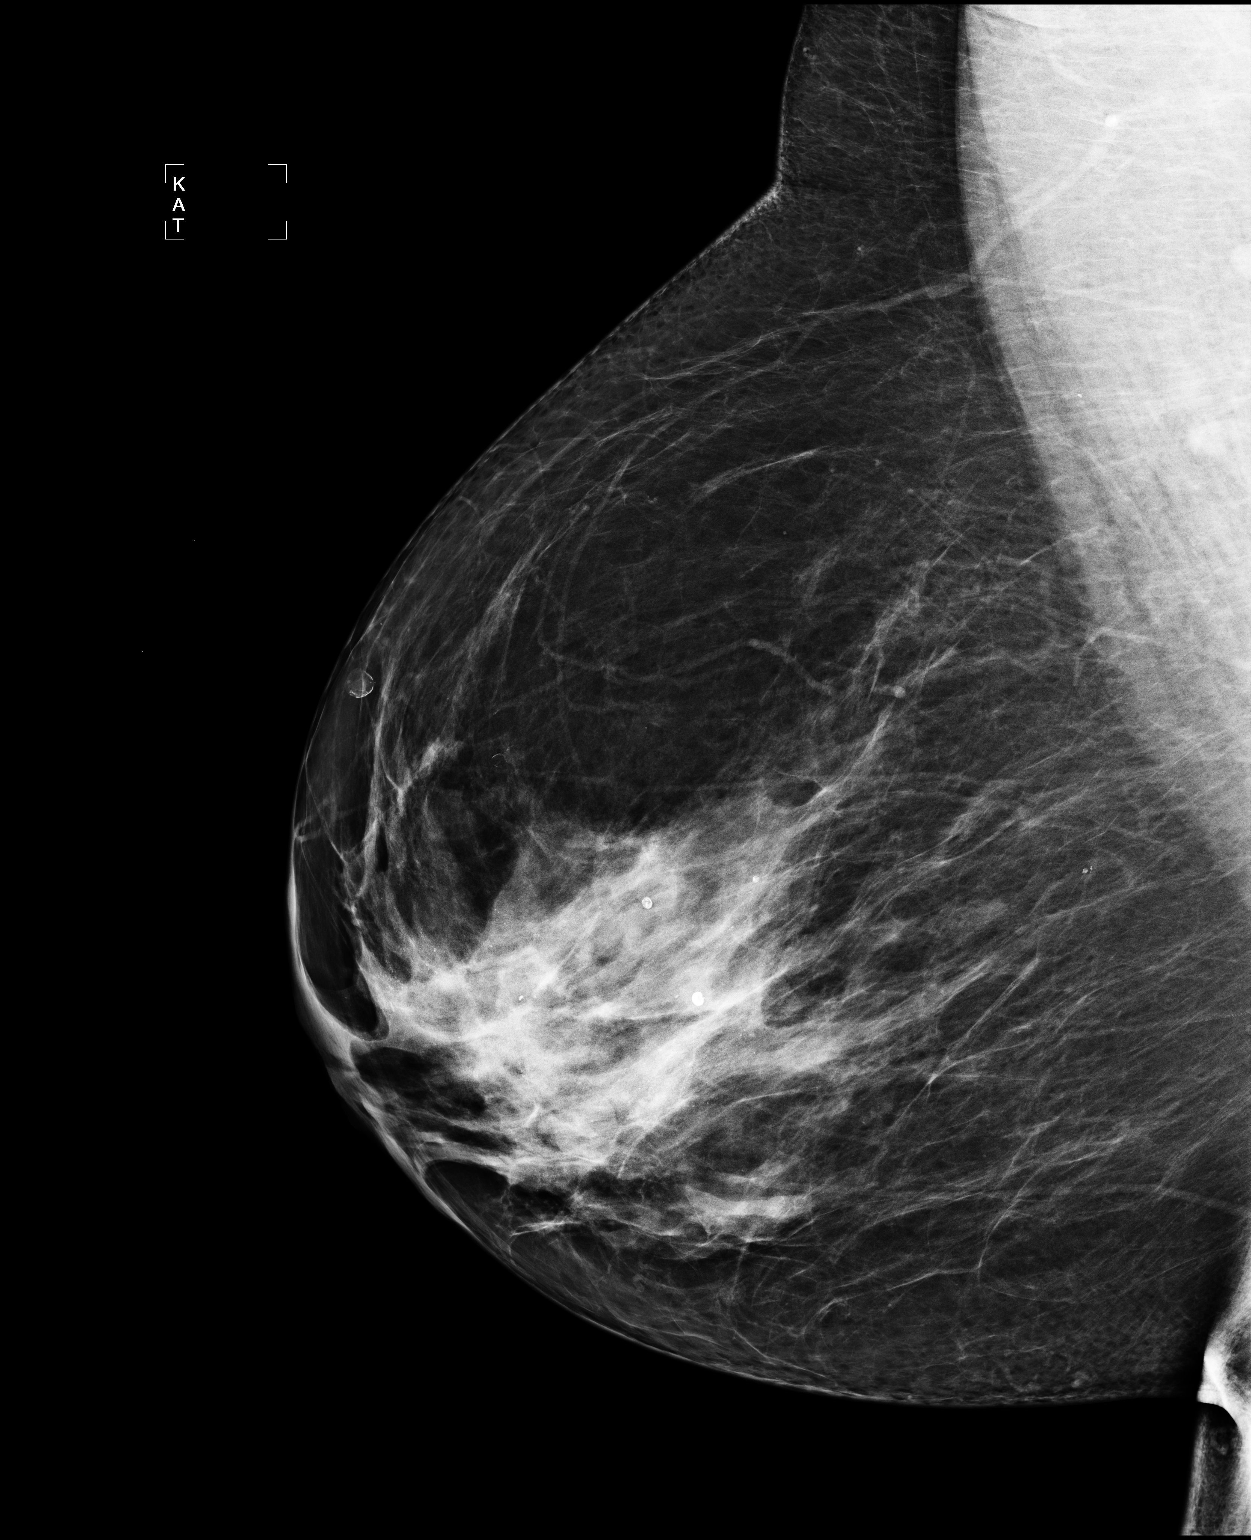

[R CC (2 of 2)]
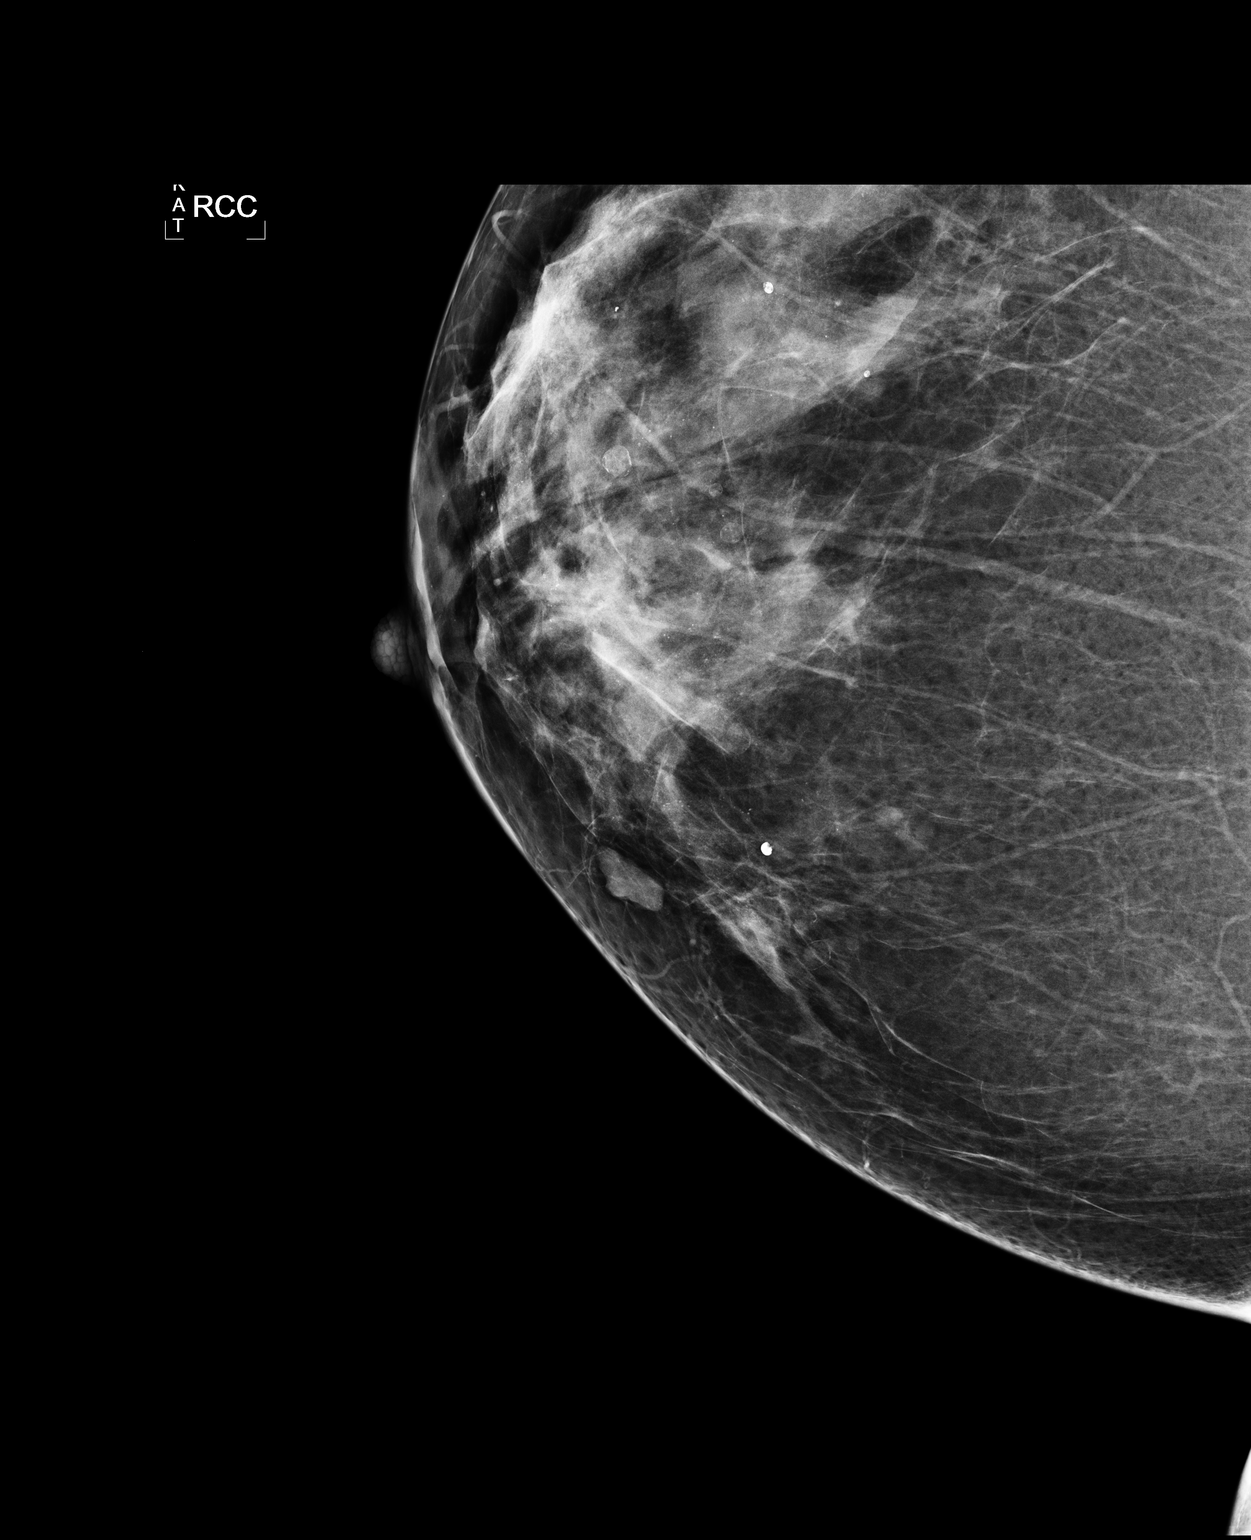

[3 of 3 positions shown; findings below may reference images not displayed]

FINDINGS: Breast parenchyma shows scattered fibroglandular
densities.  There are benign-appearing calcifications in the right
breast.  Nodule previously seen in the lateral portion of the right
breast is less apparent or has resolved.  No new suspicious mass or
calcifications identified.
Mammographic images were processed with CAD.
IMPRESSION: No mammographic evidence for malignancy.  Bilateral diagnostic
mammogram is recommended November 2011.

BI-RADS CATEGORY 1:  Negative.

## 2013-04-18 ENCOUNTER — Ambulatory Visit: Payer: Medicaid Other

## 2013-10-15 ENCOUNTER — Inpatient Hospital Stay (HOSPITAL_COMMUNITY)
Admission: EM | Admit: 2013-10-15 | Discharge: 2013-10-25 | DRG: 234 | Disposition: A | Discharge status: Home / Self Care | Payer: Medicaid Other | Attending: Cardiothoracic Surgery | Admitting: Cardiothoracic Surgery

## 2013-10-15 ENCOUNTER — Encounter (HOSPITAL_COMMUNITY): Payer: Self-pay | Admitting: Emergency Medicine

## 2013-10-15 ENCOUNTER — Emergency Department (HOSPITAL_COMMUNITY): Payer: Medicaid Other

## 2013-10-15 DIAGNOSIS — D62 Acute posthemorrhagic anemia: Secondary | ICD-10-CM | POA: Diagnosis not present

## 2013-10-15 DIAGNOSIS — I509 Heart failure, unspecified: Secondary | ICD-10-CM | POA: Diagnosis present

## 2013-10-15 DIAGNOSIS — E785 Hyperlipidemia, unspecified: Secondary | ICD-10-CM

## 2013-10-15 DIAGNOSIS — K5900 Constipation, unspecified: Secondary | ICD-10-CM | POA: Diagnosis not present

## 2013-10-15 DIAGNOSIS — Z87891 Personal history of nicotine dependence: Secondary | ICD-10-CM

## 2013-10-15 DIAGNOSIS — R0609 Other forms of dyspnea: Secondary | ICD-10-CM | POA: Diagnosis present

## 2013-10-15 DIAGNOSIS — Z8249 Family history of ischemic heart disease and other diseases of the circulatory system: Secondary | ICD-10-CM

## 2013-10-15 DIAGNOSIS — F329 Major depressive disorder, single episode, unspecified: Secondary | ICD-10-CM | POA: Diagnosis present

## 2013-10-15 DIAGNOSIS — R0989 Other specified symptoms and signs involving the circulatory and respiratory systems: Secondary | ICD-10-CM

## 2013-10-15 DIAGNOSIS — E876 Hypokalemia: Secondary | ICD-10-CM

## 2013-10-15 DIAGNOSIS — F411 Generalized anxiety disorder: Secondary | ICD-10-CM

## 2013-10-15 DIAGNOSIS — J441 Chronic obstructive pulmonary disease with (acute) exacerbation: Secondary | ICD-10-CM

## 2013-10-15 DIAGNOSIS — J4489 Other specified chronic obstructive pulmonary disease: Secondary | ICD-10-CM | POA: Diagnosis present

## 2013-10-15 DIAGNOSIS — E7800 Pure hypercholesterolemia, unspecified: Secondary | ICD-10-CM | POA: Diagnosis present

## 2013-10-15 DIAGNOSIS — F3289 Other specified depressive episodes: Secondary | ICD-10-CM

## 2013-10-15 DIAGNOSIS — I4949 Other premature depolarization: Secondary | ICD-10-CM | POA: Diagnosis not present

## 2013-10-15 DIAGNOSIS — I10 Essential (primary) hypertension: Secondary | ICD-10-CM

## 2013-10-15 DIAGNOSIS — G4730 Sleep apnea, unspecified: Secondary | ICD-10-CM

## 2013-10-15 DIAGNOSIS — J449 Chronic obstructive pulmonary disease, unspecified: Secondary | ICD-10-CM | POA: Diagnosis present

## 2013-10-15 DIAGNOSIS — O021 Missed abortion: Secondary | ICD-10-CM

## 2013-10-15 DIAGNOSIS — Z951 Presence of aortocoronary bypass graft: Secondary | ICD-10-CM

## 2013-10-15 DIAGNOSIS — E669 Obesity, unspecified: Secondary | ICD-10-CM

## 2013-10-15 DIAGNOSIS — Z6835 Body mass index (BMI) 35.0-35.9, adult: Secondary | ICD-10-CM

## 2013-10-15 DIAGNOSIS — I2510 Atherosclerotic heart disease of native coronary artery without angina pectoris: Principal | ICD-10-CM

## 2013-10-15 DIAGNOSIS — I5030 Unspecified diastolic (congestive) heart failure: Secondary | ICD-10-CM

## 2013-10-15 DIAGNOSIS — R079 Chest pain, unspecified: Secondary | ICD-10-CM

## 2013-10-15 DIAGNOSIS — B009 Herpesviral infection, unspecified: Secondary | ICD-10-CM

## 2013-10-15 DIAGNOSIS — G4733 Obstructive sleep apnea (adult) (pediatric): Secondary | ICD-10-CM

## 2013-10-15 DIAGNOSIS — I200 Unstable angina: Secondary | ICD-10-CM

## 2013-10-15 LAB — BASIC METABOLIC PANEL
BUN: 9 mg/dL (ref 6–23)
BUN: 9 mg/dL (ref 6–23)
CO2: 25 meq/L (ref 19–32)
CO2: 25 mEq/L (ref 19–32)
CREATININE: 0.88 mg/dL (ref 0.50–1.10)
Calcium: 9 mg/dL (ref 8.4–10.5)
Calcium: 9.2 mg/dL (ref 8.4–10.5)
Chloride: 100 mEq/L (ref 96–112)
Chloride: 101 mEq/L (ref 96–112)
Creatinine, Ser: 0.92 mg/dL (ref 0.50–1.10)
GFR calc Af Amer: 79 mL/min — ABNORMAL LOW (ref >90)
GFR calc Af Amer: 83 mL/min — ABNORMAL LOW (ref >90)
GFR, EST NON AFRICAN AMERICAN: 68 mL/min — AB (ref >90)
GFR, EST NON AFRICAN AMERICAN: 72 mL/min — AB (ref >90)
GLUCOSE: 99 mg/dL (ref 70–99)
GLUCOSE: 98 mg/dL (ref 70–99)
POTASSIUM: 3.1 meq/L — AB (ref 3.7–5.3)
POTASSIUM: 3.1 meq/L — AB (ref 3.7–5.3)
Sodium: 141 mEq/L (ref 137–147)
Sodium: 141 mEq/L (ref 137–147)

## 2013-10-15 LAB — PROTIME-INR
INR: 1.12 (ref 0.00–1.49)
Prothrombin Time: 14.2 seconds (ref 11.6–15.2)

## 2013-10-15 LAB — CBC WITH DIFFERENTIAL/PLATELET
Basophils Absolute: 0 10*3/uL (ref 0.0–0.1)
Basophils Relative: 0 % (ref 0–1)
Eosinophils Absolute: 0.1 10*3/uL (ref 0.0–0.7)
Eosinophils Relative: 2 % (ref 0–5)
HCT: 41 % (ref 36.0–46.0)
HEMOGLOBIN: 13.7 g/dL (ref 12.0–15.0)
LYMPHS ABS: 2.6 10*3/uL (ref 0.7–4.0)
Lymphocytes Relative: 40 % (ref 12–46)
MCH: 28.8 pg (ref 26.0–34.0)
MCHC: 33.4 g/dL (ref 30.0–36.0)
MCV: 86.1 fL (ref 78.0–100.0)
MONOS PCT: 10 % (ref 3–12)
Monocytes Absolute: 0.7 10*3/uL (ref 0.1–1.0)
NEUTROS PCT: 48 % (ref 43–77)
Neutro Abs: 3.2 10*3/uL (ref 1.7–7.7)
Platelets: 279 10*3/uL (ref 150–400)
RBC: 4.76 MIL/uL (ref 3.87–5.11)
RDW: 15.1 % (ref 11.5–15.5)
WBC: 6.6 10*3/uL (ref 4.0–10.5)

## 2013-10-15 LAB — CBC
HEMATOCRIT: 42.2 % (ref 36.0–46.0)
Hemoglobin: 14.2 g/dL (ref 12.0–15.0)
MCH: 29.4 pg (ref 26.0–34.0)
MCHC: 33.6 g/dL (ref 30.0–36.0)
MCV: 87.4 fL (ref 78.0–100.0)
Platelets: 309 10*3/uL (ref 150–400)
RBC: 4.83 MIL/uL (ref 3.87–5.11)
RDW: 15.1 % (ref 11.5–15.5)
WBC: 8.1 10*3/uL (ref 4.0–10.5)

## 2013-10-15 LAB — TROPONIN I
Troponin I: <0.3 ng/mL (ref <0.30)
Troponin I: <0.3 ng/mL (ref <0.30)

## 2013-10-15 LAB — PRO B NATRIURETIC PEPTIDE: Pro B Natriuretic peptide (BNP): 41.4 pg/mL (ref 0–125)

## 2013-10-15 MED ORDER — SALINE SPRAY 0.65 % NA SOLN
1.0000 | Freq: Four times a day (QID) | NASAL | Status: DC | PRN
  Filled 2013-10-15: qty 44

## 2013-10-15 MED ORDER — SODIUM CHLORIDE 0.9 % IV SOLN: 250.0000 mL | INTRAVENOUS | Status: DC | PRN

## 2013-10-15 MED ORDER — ALBUTEROL SULFATE (2.5 MG/3ML) 0.083% IN NEBU: 2.5000 mg | INHALATION_SOLUTION | Freq: Four times a day (QID) | RESPIRATORY_TRACT | Status: DC | PRN

## 2013-10-15 MED ORDER — ENOXAPARIN SODIUM 100 MG/ML ~~LOC~~ SOLN
100.0000 mg | Freq: Two times a day (BID) | SUBCUTANEOUS | Status: DC
  Administered 2013-10-16: 100 mg via SUBCUTANEOUS
  Filled 2013-10-15 (×4): qty 1

## 2013-10-15 MED ORDER — SODIUM CHLORIDE 0.9 % IJ SOLN
3.0000 mL | Freq: Two times a day (BID) | INTRAMUSCULAR | Status: DC
  Administered 2013-10-15 (×2): 3 mL via INTRAVENOUS
  Administered 2013-10-16: 21:00:00 via INTRAVENOUS
  Administered 2013-10-17 – 2013-10-19 (×4): 3 mL via INTRAVENOUS

## 2013-10-15 MED ORDER — ASPIRIN EC 81 MG PO TBEC
81.0000 mg | DELAYED_RELEASE_TABLET | Freq: Every day | ORAL | Status: DC
  Administered 2013-10-16 – 2013-10-19 (×4): 81 mg via ORAL
  Filled 2013-10-15 (×5): qty 1

## 2013-10-15 MED ORDER — PNEUMOCOCCAL VAC POLYVALENT 25 MCG/0.5ML IJ INJ
0.5000 mL | INJECTION | INTRAMUSCULAR | Status: AC
  Administered 2013-10-16: 0.5 mL via INTRAMUSCULAR
  Filled 2013-10-15 (×2): qty 0.5

## 2013-10-15 MED ORDER — ASPIRIN 81 MG PO CHEW
81.0000 mg | CHEWABLE_TABLET | ORAL | Status: AC
  Administered 2013-10-16: 81 mg via ORAL
  Filled 2013-10-15 (×2): qty 1

## 2013-10-15 MED ORDER — PANTOPRAZOLE SODIUM 40 MG PO TBEC
40.0000 mg | DELAYED_RELEASE_TABLET | Freq: Every morning | ORAL | Status: DC
  Administered 2013-10-15 – 2013-10-19 (×5): 40 mg via ORAL
  Filled 2013-10-15 (×5): qty 1

## 2013-10-15 MED ORDER — ENOXAPARIN SODIUM 60 MG/0.6ML ~~LOC~~ SOLN
50.0000 mg | SUBCUTANEOUS | Status: DC
  Administered 2013-10-15: 50 mg via SUBCUTANEOUS
  Filled 2013-10-15 (×2): qty 0.6

## 2013-10-15 MED ORDER — MONTELUKAST SODIUM 10 MG PO TABS
10.0000 mg | ORAL_TABLET | Freq: Every day | ORAL | Status: DC
  Administered 2013-10-15 – 2013-10-19 (×5): 10 mg via ORAL
  Filled 2013-10-15 (×7): qty 1

## 2013-10-15 MED ORDER — PAROXETINE HCL 20 MG PO TABS
20.0000 mg | ORAL_TABLET | Freq: Every day | ORAL | Status: DC
  Administered 2013-10-15 – 2013-10-25 (×10): 20 mg via ORAL
  Filled 2013-10-15 (×11): qty 1

## 2013-10-15 MED ORDER — ATORVASTATIN CALCIUM 10 MG PO TABS
10.0000 mg | ORAL_TABLET | Freq: Every day | ORAL | Status: DC
  Filled 2013-10-15: qty 1

## 2013-10-15 MED ORDER — ACYCLOVIR 200 MG PO CAPS
200.0000 mg | ORAL_CAPSULE | Freq: Every morning | ORAL | Status: DC
  Administered 2013-10-16 – 2013-10-25 (×9): 200 mg via ORAL
  Filled 2013-10-15 (×11): qty 1

## 2013-10-15 MED ORDER — NITROGLYCERIN 0.4 MG SL SUBL
0.4000 mg | SUBLINGUAL_TABLET | SUBLINGUAL | Status: DC | PRN
  Administered 2013-10-15 – 2013-10-18 (×3): 0.4 mg via SUBLINGUAL
  Filled 2013-10-15 (×2): qty 1

## 2013-10-15 MED ORDER — BENAZEPRIL HCL 20 MG PO TABS
20.0000 mg | ORAL_TABLET | Freq: Every day | ORAL | Status: DC
  Administered 2013-10-15 – 2013-10-19 (×5): 20 mg via ORAL
  Filled 2013-10-15 (×6): qty 1

## 2013-10-15 MED ORDER — BUPROPION HCL ER (SR) 100 MG PO TB12
100.0000 mg | ORAL_TABLET | Freq: Two times a day (BID) | ORAL | Status: DC
  Administered 2013-10-15 – 2013-10-25 (×20): 100 mg via ORAL
  Filled 2013-10-15 (×24): qty 1

## 2013-10-15 MED ORDER — AMLODIPINE BESYLATE 5 MG PO TABS
5.0000 mg | ORAL_TABLET | Freq: Every day | ORAL | Status: DC
  Administered 2013-10-15 – 2013-10-19 (×5): 5 mg via ORAL
  Filled 2013-10-15 (×6): qty 1

## 2013-10-15 MED ORDER — MOMETASONE FURO-FORMOTEROL FUM 100-5 MCG/ACT IN AERO
2.0000 | INHALATION_SPRAY | Freq: Two times a day (BID) | RESPIRATORY_TRACT | Status: DC
  Administered 2013-10-15 – 2013-10-19 (×6): 2 via RESPIRATORY_TRACT
  Filled 2013-10-15 (×2): qty 8.8

## 2013-10-15 MED ORDER — ENOXAPARIN SODIUM 100 MG/ML ~~LOC~~ SOLN
100.0000 mg | Freq: Once | SUBCUTANEOUS | Status: AC
  Administered 2013-10-15: 100 mg via SUBCUTANEOUS
  Filled 2013-10-15 (×2): qty 1

## 2013-10-15 MED ORDER — SODIUM CHLORIDE 0.9 % IV SOLN
1.0000 mL/kg/h | INTRAVENOUS | Status: DC
  Administered 2013-10-16 (×2): 1 mL/kg/h via INTRAVENOUS

## 2013-10-15 MED ORDER — POTASSIUM CHLORIDE CRYS ER 20 MEQ PO TBCR
40.0000 meq | EXTENDED_RELEASE_TABLET | Freq: Once | ORAL | Status: DC
  Filled 2013-10-15: qty 2

## 2013-10-15 MED ORDER — ASPIRIN 81 MG PO CHEW
324.0000 mg | CHEWABLE_TABLET | Freq: Once | ORAL | Status: AC
  Administered 2013-10-15: 324 mg via ORAL
  Filled 2013-10-15: qty 4

## 2013-10-15 MED ORDER — AMLODIPINE BESY-BENAZEPRIL HCL 5-20 MG PO CAPS: 1.0000 | ORAL_CAPSULE | Freq: Every morning | ORAL | Status: DC

## 2013-10-15 MED ORDER — SODIUM CHLORIDE 0.9 % IJ SOLN
3.0000 mL | Freq: Two times a day (BID) | INTRAMUSCULAR | Status: DC
  Administered 2013-10-15: 3 mL via INTRAVENOUS

## 2013-10-15 MED ORDER — SODIUM CHLORIDE 0.9 % IJ SOLN: 3.0000 mL | INTRAMUSCULAR | Status: DC | PRN

## 2013-10-15 NOTE — H&P (Signed)
History and Physical    Amber Wong LKJ:[id number] DOB: [birth year] DOA: 10/15/2013  Referring physician: Dr. Sharol Given PCP: Imelda Pillow, NP  Specialists: cardiology  Chief Complaint: chest pain   HPI: Amber Wong is a 58 y.o. female has a past medical history significant for HTN, HLD, OSA on CPAP, family history of MIs her father at age 9 and mother at age 51, presents to the ED with a chief complaint of chest pain. Patient is an extremely poor historian and she describes to me in the ED multiple types of chest pain over various time periods. She is very non specific and vague about her symptoms; she describes to me new onset chest pain last week like "an elephant sitting on my chest", irradiating of her left arm, resolved with sitting up. She also describes chest discomfort (but different, more dull) with exertion, associated with SOB. She also has left sided chest pain every time she "eats the wrong stuff". Her pain in the ED is dull, retrosternal irradiating to her left arm, relieved to s.l. Nitroglycerin x 2. She can't tell me if she experienced pain like this in the past. It seem like her chest pressure and left arm pain/numnbess is new since last Friday. In the ED, EKG without specific ischemic changes, troponin is negative. She denies any fever or chills, denies   Review of Systems: as per HPI otherwise negative.  Past Medical History  Diagnosis Date  . Asthma   . Hypertension   . Sleep apnea   . Nocturia   . Depression   . Morbid obesity   . OSA (obstructive sleep apnea)     w/ hypoventilation  . Hypercholesteremia   . Obstructive sleep apnea (adult) (pediatric) 12/27/2012    AHI 17.7 baseline, titrated to 6 cm , AHi now 2.4 - compliance    Past Surgical History  Procedure Laterality Date  . Partial hysterectomy    . Knee surgery      chronic pain and limping since  . Cystectomy    . Breast lumpectomy Right     benign at age 29  . Tonsillectomy      Social History:  reports that she has quit smoking. Her smoking use included Cigarettes. She smoked 0.00 packs per day. She has never used smokeless tobacco. She reports that she does not drink alcohol or use illicit drugs.  Allergies  Allergen Reactions  . Iohexol Other (See Comments)     Code: HIVES, Desc: premedicated 2 hrs prior with 200mg  soulumedrol IV, no reaction s/p injection, Onset Date: [id number]   . Penicillins Other (See Comments)    REACTION: pruritis    Family History  Problem Relation Age of Onset  . Cancer Sister     double mastectomy  . Heart attack Sister     2 sisters    Prior to Admission medications   Medication Sig Start Date End Date Taking? Authorizing Provider  acyclovir (ZOVIRAX) 200 MG capsule Take 200 mg by mouth every morning.   Yes Historical Provider, MD  albuterol (PROVENTIL HFA;VENTOLIN HFA) 108 (90 BASE) MCG/ACT inhaler Inhale 2 puffs into the lungs every 6 (six) hours as needed for wheezing.    Yes Historical Provider, MD  amLODipine-benazepril (LOTREL) 5-20 MG per capsule Take 1 capsule by mouth every morning.   Yes Historical Provider, MD  aspirin 81 MG chewable tablet Chew 81 mg by mouth every morning.   Yes Historical Provider, MD  buPROPion (WELLBUTRIN SR) 100 MG 12 hr  tablet Take 100 mg by mouth 2 (two) times daily.   Yes Historical Provider, MD  cholecalciferol (VITAMIN D) 1000 UNITS tablet Take 1,000 Units by mouth every morning.   Yes Historical Provider, MD  Fluticasone-Salmeterol (ADVAIR) 100-50 MCG/DOSE AEPB Inhale 1 puff into the lungs every 12 (twelve) hours.   Yes Historical Provider, MD  Fluticasone-Salmeterol (ADVAIR) 100-50 MCG/DOSE AEPB Inhale 1 puff into the lungs 2 (two) times daily.   Yes Historical Provider, MD  indomethacin (INDOCIN) 50 MG capsule Take 50 mg by mouth 2 (two) times daily with a meal.   Yes Historical Provider, MD  montelukast (SINGULAIR) 10 MG tablet Take 10 mg by mouth at bedtime.   Yes Historical Provider,  MD  pantoprazole (PROTONIX) 40 MG tablet Take 40 mg by mouth every morning.   Yes Historical Provider, MD  PARoxetine (PAXIL) 20 MG tablet Take 20 mg by mouth every morning.   Yes Historical Provider, MD  sodium chloride (OCEAN) 0.65 % SOLN nasal spray Place 1 spray into both nostrils 4 (four) times daily as needed for congestion.   Yes Historical Provider, MD  vitamin E 400 UNIT capsule Take 400 Units by mouth daily.   Yes Historical Provider, MD   Physical Exam: Filed Vitals:   10/15/13 0630 10/15/13 0646 10/15/13 0653 10/15/13 0656  BP:  135/86 146/74 114/76  Pulse: 85 78 81 84  Temp:      TempSrc:      Resp: 22     SpO2: 97%        General:  No apparent distress  Eyes: no scleral icterus  ENT: moist oropharynx  Neck: supple, no JVD  Cardiovascular: regular rate without MRG; 2+ peripheral pulses  Respiratory: CTA biL, good air movement without wheezing, rhonchi or crackled  Abdomen: soft, non tender to palpation, positive bowel sounds, no guarding, no rebound  Skin: no rashes  Musculoskeletal: no peripheral edema  Psychiatric: normal mood and affect  Neurologic: non focal  Labs on Admission:  Basic Metabolic Panel:  Recent Labs Lab 10/15/13 0615  NA 141  K 3.1*  CL 100  CO2 25  GLUCOSE 99  BUN 9  CREATININE 0.92  CALCIUM 9.0   CBC:  Recent Labs Lab 10/15/13 0615  WBC 6.6  NEUTROABS 3.2  HGB 13.7  HCT 41.0  MCV 86.1  PLT 279   Cardiac Enzymes:  Recent Labs Lab 10/15/13 0615  TROPONINI <0.30    Radiological Exams on Admission: Dg Chest 2 View  10/15/2013   CLINICAL DATA:  Mid and lateral chest pain, smoker.  EXAM: CHEST  2 VIEW  COMPARISON:  DG CHEST 2 VIEW dated 09/13/2011  FINDINGS: Cardiomediastinal silhouette is unremarkable. The lungs are clear without pleural effusions or focal consolidations. Trachea projects midline and there is no pneumothorax. Soft tissue planes and included osseous structures are non-suspicious. Mild degenerative  change of thoracic spine. Multiple EKG lines overlie the patient and may obscure subtle underlying pathology.  IMPRESSION: No active cardiopulmonary disease.   Electronically Signed   By: Elon Alas   On: 10/15/2013 06:48    EKG: Independently reviewed.  Assessment/Plan Principal Problem:   CHEST PAIN UNSPECIFIED Active Problems:   HYPERLIPIDEMIA   ANXIETY   SLEEP APNEA   DYSPNEA ON EXERTION   Chest pain   #1 Chest pain - with typical and atypical features. Given some aspects of history, HTN, strong family history I will consult cardiology for evaluation. Keep her NPO. Admit to telemetry, cycle CEs.  #2  HTN - continue home medications #3 DOE - as per #1. #4 HLD - start Atorvastatin   Diet: NPO Fluids: none DVT Prophylaxis: Lovenox  Code Status: Full  Family Communication: none  Disposition Plan: observation  Time spent: 48  This note has been created with Surveyor, quantity. Any transcriptional errors are unintentional.   Jarryd Gratz M. Cruzita Lederer, MD [facility] Pager [phone]  If 7PM-7AM, please contact night-coverage [email or web address] Password [facility] 10/15/2013, 7:33 AM

## 2013-10-15 NOTE — Care Management Note (Addendum)
  Page 2 of 2   10/26/2013     9:15:57 AM CARE MANAGEMENT NOTE 10/26/2013  Patient:  Amber Wong, Amber Wong   Account Number:  [id number]  Date Initiated:  10/15/2013  Documentation initiated by:  Dessa Phi  Subjective/Objective Assessment:   58 Y/O F ADMITTED W/CHEST PAIN.     Action/Plan:   FROM HOME.HAS PCP,PHARMACY.HAS RW.   Anticipated DC Date:     Anticipated DC Plan:  [name]  CM consult      Choice offered to / List presented to:          [facility] arranged  HH-1 RN      [place]   Status of service:  Completed, signed off Medicare Important Message given?   (If response is "NO", the following Medicare IM given date fields will be blank) Date Medicare IM given:   Date Additional Medicare IM given:    Discharge Disposition:  [place]  Per UR Regulation:  Reviewed for med. necessity/level of care/duration of stay  If discussed at [place] of Stay Meetings, dates discussed:   10/23/2013    Comments:  10/25/13 11:20 CM met with pt in room while her son, Brenton Grills was on the phone.  Brenton Grills will be taking care of his mother post discharge.  Jermaine asked about FMLA and CM instructed him to request the FMLA papers from his employer [employer] Resources Dept and bring them to the follow up appt to be addressed by the office staff.  Choice was offered and [facility] will render [facility] service.  Address for pt will be [address] [place], [place] [address]. and Brenton Grills will be contact [phone].  Referral called to [facility] with address and contact information.  No other CM needs were communicated.  Mariane Masters, BSN, CM [phone].  10/16/13 KATHY MAHABIR RN,BSN NCM [facility].K-2.9,K- [facility]RUNS.  10/15/13 KATHY MAHABIR RN,BSN NCM [phone] NO ANTICIPATED D/C NEEDS.

## 2013-10-15 NOTE — ED Provider Notes (Signed)
CSN: [id number]     Arrival date & time 10/15/13  0602 History   First MD Initiated Contact with Patient 10/15/13 0615     Chief Complaint  Patient presents with  . Chest Pain     (Consider location/radiation/quality/duration/timing/severity/associated sxs/prior Treatment) HPI 58 yo female presents to the ER from home with complaint of chest pressure/pain, shortness of breath.  Pt is a poor historian, vague about times, nature of symptoms.  She does relate on Friday evening, 4 days ago,  when she was lying down to sleep, she felt as though there was an elephant sitting on her chest.  This has occurred intermittently since then, worse when lying down.  No nausea, diaphoresis.  Pt reports chest pain and sob when walking up stairs or other exertion over the last 2 weeks.  Pt has been unable to use her cpap due to ill fitting mask and pressure in her chest.  Chest pain is dull, central/left sided.  Pt has h/o htn, sleep apnea, elevated cholesterol, obesity.  Mother died at 13 of MI.  Father died at 68 of MI.  She has not been evaluated by a cardiologist at any time.  She reports being told in the 80's she had a leaky left heart valve.  Pt presents this morning as she has not slept in several nights due to feeling too afraid to fall asleep, and due to chest pressure when lying flat.  No leg swelling, no h/o chf, no pnd.    Past Medical History  Diagnosis Date  . Asthma   . Hypertension   . Sleep apnea   . Nocturia   . Depression   . Morbid obesity   . OSA (obstructive sleep apnea)     w/ hypoventilation  . Hypercholesteremia   . Obstructive sleep apnea (adult) (pediatric) 12/27/2012    AHI 17.7 baseline, titrated to 6 cm , AHi now 2.4 - compliance    Past Surgical History  Procedure Laterality Date  . Partial hysterectomy    . Knee surgery      chronic pain and limping since  . Cystectomy    . Breast lumpectomy Right     benign at age 58  . Tonsillectomy     Family History  Problem  Relation Age of Onset  . Cancer Sister     double mastectomy  . Heart attack Sister     2 sisters   History  Substance Use Topics  . Smoking status: Former Smoker    Types: Cigarettes  . Smokeless tobacco: Never Used     Comment: Quit 2005  . Alcohol Use: No   OB History   Grav Para Term Preterm Abortions TAB SAB Ect Mult Living                 Review of Systems  All other systems reviewed and are negative. other than listed in hpi    Allergies  Iohexol and Penicillins  Home Medications   Prior to Admission medications   Medication Sig Start Date End Date Taking? Authorizing Provider  acyclovir (ZOVIRAX) 200 MG capsule Take 200 mg by mouth every morning.   Yes Historical Provider, MD  albuterol (PROVENTIL HFA;VENTOLIN HFA) 108 (90 BASE) MCG/ACT inhaler Inhale 2 puffs into the lungs every 6 (six) hours as needed for wheezing.     Historical Provider, MD  amLODipine-benazepril (LOTREL) 5-20 MG per capsule Take 1 capsule by mouth every morning.    Historical Provider, MD  aspirin 81  MG chewable tablet Chew 81 mg by mouth every morning.    Historical Provider, MD  cholecalciferol (VITAMIN D) 1000 UNITS tablet Take 1,000 Units by mouth every morning.    Historical Provider, MD  Fluticasone-Salmeterol (ADVAIR) 100-50 MCG/DOSE AEPB Inhale 1 puff into the lungs every 12 (twelve) hours.    Historical Provider, MD  indomethacin (INDOCIN) 50 MG capsule Take 50 mg by mouth 2 (two) times daily with a meal.    Historical Provider, MD  pantoprazole (PROTONIX) 40 MG tablet Take 40 mg by mouth every morning.    Historical Provider, MD  PARoxetine (PAXIL) 20 MG tablet Take 20 mg by mouth every morning.    Historical Provider, MD  sodium chloride (OCEAN) 0.65 % SOLN nasal spray Place 1 spray into both nostrils 4 (four) times daily as needed for congestion.    Historical Provider, MD   BP 135/86  Pulse 85  Temp(Src) 98.8 F (37.1 C) (Oral)  Resp 18  SpO2 99% Physical Exam  Nursing note  and vitals reviewed. Constitutional: She is oriented to person, place, and time. She appears well-developed and well-nourished. She appears distressed.  HENT:  Head: Normocephalic and atraumatic.  Nose: Nose normal.  Mouth/Throat: Oropharynx is clear and moist.  Eyes: Conjunctivae and EOM are normal. Pupils are equal, round, and reactive to light.  Neck: Normal range of motion. Neck supple. No JVD present. No tracheal deviation present. No thyromegaly present.  Cardiovascular: Normal rate, regular rhythm, normal heart sounds and intact distal pulses.  Exam reveals no gallop and no friction rub.   No murmur heard. Pulmonary/Chest: Effort normal and breath sounds normal. No stridor. No respiratory distress. She has no wheezes. She has no rales. She exhibits no tenderness.  Abdominal: Soft. Bowel sounds are normal. She exhibits no distension and no mass. There is no tenderness. There is no rebound and no guarding.  Musculoskeletal: Normal range of motion. She exhibits no edema and no tenderness.  Pt with intermittent myoclonic jerks  Lymphadenopathy:    She has no cervical adenopathy.  Neurological: She is alert and oriented to person, place, and time. She exhibits normal muscle tone. Coordination normal.  Skin: Skin is warm and dry. No rash noted. No erythema. No pallor.  Psychiatric: She has a normal mood and affect. Her behavior is normal. Judgment and thought content normal.    ED Course  Procedures (including critical care time) Labs Review Labs Reviewed  CBC WITH DIFFERENTIAL  BASIC METABOLIC PANEL  TROPONIN I  PRO B NATRIURETIC PEPTIDE    Imaging Review No results found.   EKG Interpretation None      Date: 10/15/2013  Rate: 90  Rhythm: normal sinus rhythm and premature ventricular contractions (PVC)  QRS Axis: normal  Intervals: normal  ST/T Wave abnormalities: nonspecific ST changes  Conduction Disutrbances:none  Narrative Interpretation:   Old EKG Reviewed:  unchanged    MDM   Final diagnoses:  Chest pain    58 yo female with chest pressure, pain that has been escalating over the last few days.  She has multiple risk factors for CAD/ACS/unstable angina.  Will get labs, tx with aspirin and ntg.  EKG with frequent pvc, but no overt ischemia.  Pt will most likely benefit from inpt obs and close outpt f/u with cardiology.    Kalman Drape, MD 10/15/13 [id number]

## 2013-10-15 NOTE — ED Notes (Signed)
Report given-transport to 4th floor

## 2013-10-15 NOTE — ED Notes (Signed)
Pt presents to ED with c/o on-going chest pain, onset 2 weeks ago. Pt states pain "comes and goes, feels like an elephant is sitting on my chest".  Pt decided to come to ED at this time "because I can't sleep, I'm afraid to sleep". Pt denies headache or dizziness or nausea; pt's skin warm and dry.

## 2013-10-15 NOTE — Progress Notes (Signed)
ANTICOAGULATION CONSULT NOTE - Initial Consult  Pharmacy Consult for Lovenox Indication: chest pain/ACS  Allergies  Allergen Reactions  . Iohexol Other (See Comments)     Code: HIVES, Desc: premedicated 2 hrs prior with 200mg  soulumedrol IV, no reaction s/p injection, Onset Date: [id number]   . Penicillins Other (See Comments)    REACTION: pruritis   Patient Measurements: Height: 5\' 8"  (172.7 cm) Weight: 230 lb (104.327 kg) IBW/kg (Calculated) : 63.9  Vital Signs: Temp: 97.9 F (36.6 C) (05/06 0917) Temp src: Oral (05/06 0612) BP: 136/87 mmHg (05/06 0917) Pulse Rate: 88 (05/06 0830)  Labs:  Recent Labs  10/15/13 0615  HGB 13.7  HCT 41.0  PLT 279  CREATININE 0.92  TROPONINI <0.30   Estimated Creatinine Clearance: 85.3 ml/min (by C-G formula based on Cr of 0.92).  Medical History: Past Medical History  Diagnosis Date  . Asthma   . Hypertension   . Sleep apnea   . Nocturia   . Depression   . Morbid obesity   . OSA (obstructive sleep apnea)     w/ hypoventilation  . Hypercholesteremia   . Obstructive sleep apnea (adult) (pediatric) 12/27/2012    AHI 17.7 baseline, titrated to 6 cm , AHi now 2.4 - compliance    Medications:  Scheduled:  . amLODipine  5 mg Oral Daily   And  . benazepril  20 mg Oral Daily  . [START ON 10/16/2013] aspirin EC  81 mg Oral Daily  . buPROPion  100 mg Oral BID  . mometasone-formoterol  2 puff Inhalation BID  . montelukast  10 mg Oral QHS  . pantoprazole  40 mg Oral q morning - 10a  . PARoxetine  20 mg Oral Daily  . [START ON 10/16/2013] pneumococcal 23 valent vaccine  0.5 mL Intramuscular Tomorrow-1000  . potassium chloride  40 mEq Oral Once  . sodium chloride  3 mL Intravenous Q12H   Assessment: 3 yoF presenting with chest pain/ shortness of breath. Stated severe chest pain last Friday with L arm and jaw pain, arm tingling. Concern for Unstable Angina, begin Lovenox, plan Cath Thursday. Renal function adequate.  Received Lovenox  50mg  dose ~ 0830 this am-was ordered for VTE prophylaxis  Therapeutic dose = 100mg  Lovenox q12   Goal of Therapy:  Anti-Xa level 0.6-1 units/ml 4hrs after LMWH dose given Monitor platelets by anticoagulation protocol: Yes   Plan:   Change Lovenox to 100mg  SQ bid, begin 1600  NPO for Cardiac Cath am 5/7  Minda Ditto PharmD Pager [phone] 10/15/2013, 12:14 PM

## 2013-10-15 NOTE — Consult Note (Addendum)
CONSULTATION NOTE  Reason for Consult: Chest pain  Requesting Physician: Dr. Cruzita Lederer  Cardiologist: None (new)  HPI: This is a 58 y.o. female with a past medical history significant for morbid obesity, OSA on CPAP, asthma, HTN, hyperlipidemia and strong family history of CAD (mother died at 3 of MI and father died at age 45 of MI). She presents with chest pain and associated shortness of breath. She reports that last Friday she has chest pain that was "like and elephant" sitting on her chest.  She has also had left arm and jaw pain, but occasionally gets tingling in both arms. She has been short of breath and gets headaches. Today she had some left chest discomfort, under the breast. She was given nitroglycerin and her symptoms have improved somewhat.  First troponin is negative, BNP is low. CXR is unremarkable. EKG is of low quality - but may show some mild ischemic changes anterolaterally.  She apparently saw Dr. Doylene Canard in 2011 and underwent a stress test which was negative for ischemia. A chest CTPA in 2011 was negative for PE, however, my personal review of the CT films demonstrates calcium in the proximal to mid LAD and mid-LCx arteries. There may be some RCA calcium as well.  PMHx:  Past Medical History  Diagnosis Date  . Asthma   . Hypertension   . Sleep apnea   . Nocturia   . Depression   . Morbid obesity   . OSA (obstructive sleep apnea)     w/ hypoventilation  . Hypercholesteremia   . Obstructive sleep apnea (adult) (pediatric) 12/27/2012    AHI 17.7 baseline, titrated to 6 cm , AHi now 2.4 - compliance    Past Surgical History  Procedure Laterality Date  . Partial hysterectomy    . Knee surgery      chronic pain and limping since  . Cystectomy    . Breast lumpectomy Right     benign at age 73  . Tonsillectomy      FAMHx: Family History  Problem Relation Age of Onset  . Cancer Sister     double mastectomy  . Heart attack Sister     2 sisters in their  41's with MI/stents  . Heart attack Mother 18    died of MI  . Heart attack Father 61    died of MI    SOCHx:  reports that she has quit smoking. Her smoking use included Cigarettes. She smoked 0.00 packs per day for 15 years. She has never used smokeless tobacco. She reports that she does not drink alcohol or use illicit drugs.  ALLERGIES: Allergies  Allergen Reactions  . Iohexol Other (See Comments)     Code: HIVES, Desc: premedicated 2 hrs prior with $RemoveBe'200mg'[id number]$  soulumedrol IV, no reaction s/p injection, Onset Date: [id number]   . Penicillins Other (See Comments)    REACTION: pruritis    ROS: A comprehensive review of systems was negative except for: Respiratory: positive for dyspnea on exertion Cardiovascular: positive for chest pain Neurological: positive for headaches  HOME MEDICATIONS: Prescriptions prior to admission  Medication Sig Dispense Refill  . acyclovir (ZOVIRAX) 200 MG capsule Take 200 mg by mouth every morning.      Marland Kitchen albuterol (PROVENTIL HFA;VENTOLIN HFA) 108 (90 BASE) MCG/ACT inhaler Inhale 2 puffs into the lungs every 6 (six) hours as needed for wheezing.       Marland Kitchen amLODipine-benazepril (LOTREL) 5-20 MG per capsule Take 1 capsule by mouth every morning.      Marland Kitchen  aspirin 81 MG chewable tablet Chew 81 mg by mouth every morning.      Marland Kitchen buPROPion (WELLBUTRIN SR) 100 MG 12 hr tablet Take 100 mg by mouth 2 (two) times daily.      . cholecalciferol (VITAMIN D) 1000 UNITS tablet Take 1,000 Units by mouth every morning.      . Fluticasone-Salmeterol (ADVAIR) 100-50 MCG/DOSE AEPB Inhale 1 puff into the lungs every 12 (twelve) hours.      . Fluticasone-Salmeterol (ADVAIR) 100-50 MCG/DOSE AEPB Inhale 1 puff into the lungs 2 (two) times daily.      . indomethacin (INDOCIN) 50 MG capsule Take 50 mg by mouth 2 (two) times daily with a meal.      . montelukast (SINGULAIR) 10 MG tablet Take 10 mg by mouth at bedtime.      . pantoprazole (PROTONIX) 40 MG tablet Take 40 mg by mouth every  morning.      Marland Kitchen PARoxetine (PAXIL) 20 MG tablet Take 20 mg by mouth every morning.      . sodium chloride (OCEAN) 0.65 % SOLN nasal spray Place 1 spray into both nostrils 4 (four) times daily as needed for congestion.      . vitamin E 400 UNIT capsule Take 400 Units by mouth daily.        HOSPITAL MEDICATIONS: Prior to Admission:  Prescriptions prior to admission  Medication Sig Dispense Refill  . acyclovir (ZOVIRAX) 200 MG capsule Take 200 mg by mouth every morning.      Marland Kitchen albuterol (PROVENTIL HFA;VENTOLIN HFA) 108 (90 BASE) MCG/ACT inhaler Inhale 2 puffs into the lungs every 6 (six) hours as needed for wheezing.       Marland Kitchen amLODipine-benazepril (LOTREL) 5-20 MG per capsule Take 1 capsule by mouth every morning.      Marland Kitchen aspirin 81 MG chewable tablet Chew 81 mg by mouth every morning.      Marland Kitchen buPROPion (WELLBUTRIN SR) 100 MG 12 hr tablet Take 100 mg by mouth 2 (two) times daily.      . cholecalciferol (VITAMIN D) 1000 UNITS tablet Take 1,000 Units by mouth every morning.      . Fluticasone-Salmeterol (ADVAIR) 100-50 MCG/DOSE AEPB Inhale 1 puff into the lungs every 12 (twelve) hours.      . Fluticasone-Salmeterol (ADVAIR) 100-50 MCG/DOSE AEPB Inhale 1 puff into the lungs 2 (two) times daily.      . indomethacin (INDOCIN) 50 MG capsule Take 50 mg by mouth 2 (two) times daily with a meal.      . montelukast (SINGULAIR) 10 MG tablet Take 10 mg by mouth at bedtime.      . pantoprazole (PROTONIX) 40 MG tablet Take 40 mg by mouth every morning.      Marland Kitchen PARoxetine (PAXIL) 20 MG tablet Take 20 mg by mouth every morning.      . sodium chloride (OCEAN) 0.65 % SOLN nasal spray Place 1 spray into both nostrils 4 (four) times daily as needed for congestion.      . vitamin E 400 UNIT capsule Take 400 Units by mouth daily.        VITALS: Blood pressure 136/87, pulse 88, temperature 97.9 F (36.6 C), temperature source Oral, resp. rate 18, height 5' 8"  (1.727 m), weight 230 lb (104.327 kg), SpO2  99.00%.  PHYSICAL EXAM: General appearance: alert and no distress Neck: no carotid bruit and no JVD Lungs: clear to auscultation bilaterally Heart: regular rate and rhythm, S1, S2 normal, no murmur, click, rub or gallop  Abdomen: soft, non-tender; bowel sounds normal; no masses,  no organomegaly and obese Extremities: extremities normal, atraumatic, no cyanosis or edema Pulses: 2+ and symmetric Skin: Skin color, texture, turgor normal. No rashes or lesions Neurologic: Grossly normal Psych: Mood, affect normal  LABS: Results for orders placed during the hospital encounter of 10/15/13 (from the past 48 hour(s))  CBC WITH DIFFERENTIAL     Status: None   Collection Time    10/15/13  6:15 AM      Result Value Ref Range   WBC 6.6  4.0 - 10.5 K/uL   RBC 4.76  3.87 - 5.11 MIL/uL   Hemoglobin 13.7  12.0 - 15.0 g/dL   HCT 41.0  36.0 - 46.0 %   MCV 86.1  78.0 - 100.0 fL   MCH 28.8  26.0 - 34.0 pg   MCHC 33.4  30.0 - 36.0 g/dL   RDW 15.1  11.5 - 15.5 %   Platelets 279  150 - 400 K/uL   Neutrophils Relative % 48  43 - 77 %   Neutro Abs 3.2  1.7 - 7.7 K/uL   Lymphocytes Relative 40  12 - 46 %   Lymphs Abs 2.6  0.7 - 4.0 K/uL   Monocytes Relative 10  3 - 12 %   Monocytes Absolute 0.7  0.1 - 1.0 K/uL   Eosinophils Relative 2  0 - 5 %   Eosinophils Absolute 0.1  0.0 - 0.7 K/uL   Basophils Relative 0  0 - 1 %   Basophils Absolute 0.0  0.0 - 0.1 K/uL  BASIC METABOLIC PANEL     Status: Abnormal   Collection Time    10/15/13  6:15 AM      Result Value Ref Range   Sodium 141  137 - 147 mEq/L   Potassium 3.1 (*) 3.7 - 5.3 mEq/L   Chloride 100  96 - 112 mEq/L   CO2 25  19 - 32 mEq/L   Glucose, Bld 99  70 - 99 mg/dL   BUN 9  6 - 23 mg/dL   Creatinine, Ser 0.92  0.50 - 1.10 mg/dL   Calcium 9.0  8.4 - 10.5 mg/dL   GFR calc non Af Amer 68 (*) >90 mL/min   GFR calc Af Amer 79 (*) >90 mL/min   Comment: (NOTE)     The eGFR has been calculated using the CKD EPI equation.     This calculation  has not been validated in all clinical situations.     eGFR's persistently <90 mL/min signify possible Chronic Kidney     Disease.  TROPONIN I     Status: None   Collection Time    10/15/13  6:15 AM      Result Value Ref Range   Troponin I <0.30  <0.30 ng/mL   Comment:            Due to the release kinetics of cTnI,     a negative result within the first hours     of the onset of symptoms does not rule out     myocardial infarction with certainty.     If myocardial infarction is still suspected,     repeat the test at appropriate intervals.  PRO B NATRIURETIC PEPTIDE     Status: None   Collection Time    10/15/13  8:02 AM      Result Value Ref Range   Pro B Natriuretic peptide (BNP) 41.4  0 - 125 pg/mL  IMAGING: Dg Chest 2 View  10/15/2013   CLINICAL DATA:  Mid and lateral chest pain, smoker.  EXAM: CHEST  2 VIEW  COMPARISON:  DG CHEST 2 VIEW dated 09/13/2011  FINDINGS: Cardiomediastinal silhouette is unremarkable. The lungs are clear without pleural effusions or focal consolidations. Trachea projects midline and there is no pneumothorax. Soft tissue planes and included osseous structures are non-suspicious. Mild degenerative change of thoracic spine. Multiple EKG lines overlie the patient and may obscure subtle underlying pathology.  IMPRESSION: No active cardiopulmonary disease.   Electronically Signed   By: Elon Alas   On: 10/15/2013 06:48    HOSPITAL DIAGNOSES: Principal Problem:   CHEST PAIN UNSPECIFIED Active Problems:   HYPERLIPIDEMIA   ANXIETY   SLEEP APNEA   DYSPNEA ON EXERTION   Chest pain  EKG: Sinus rhythm with possible anterolateral ST depression  IMPRESSION: 1. Chest pain, concerning for unstable angina 2. Multiple cardiac risk factors including a strong family history of premature CAD 3. Coronary artery calcification  RECOMMENDATION: 1. Mrs. Amber Wong is describing symptoms concerning for unstable angina. There are also some atypical  features, however, she has a compelling family history and evidence for CAC by chest CT in 2011. There may be some mild anterolateral ischemic changes. Give her obesity there is a higher than normal risk of false positive stress testing and her pretest probability for CAD is intermediate to high, therefore, I would recommend cardiac catheterization in the am tomorrow at [facility]. Would heparinize and keep NPO p MN tonight. I discussed the risk and benefits of the procedure and she is willing to proceed.  Thanks for consulting [place].  Time Spent Directly with Patient: 30 minutes  Pixie Casino, MD, [facility] Attending Cardiologist [place] 10/15/2013, 11:42 AM

## 2013-10-16 ENCOUNTER — Encounter (HOSPITAL_COMMUNITY)
Admission: EM | Disposition: A | Discharge status: Home / Self Care | Payer: Self-pay | Source: Home / Self Care | Attending: Cardiothoracic Surgery

## 2013-10-16 DIAGNOSIS — F3289 Other specified depressive episodes: Secondary | ICD-10-CM

## 2013-10-16 DIAGNOSIS — I5030 Unspecified diastolic (congestive) heart failure: Secondary | ICD-10-CM

## 2013-10-16 DIAGNOSIS — E876 Hypokalemia: Secondary | ICD-10-CM | POA: Diagnosis present

## 2013-10-16 DIAGNOSIS — E669 Obesity, unspecified: Secondary | ICD-10-CM | POA: Diagnosis present

## 2013-10-16 DIAGNOSIS — R079 Chest pain, unspecified: Secondary | ICD-10-CM

## 2013-10-16 DIAGNOSIS — I2510 Atherosclerotic heart disease of native coronary artery without angina pectoris: Secondary | ICD-10-CM

## 2013-10-16 DIAGNOSIS — F329 Major depressive disorder, single episode, unspecified: Secondary | ICD-10-CM

## 2013-10-16 DIAGNOSIS — E785 Hyperlipidemia, unspecified: Secondary | ICD-10-CM

## 2013-10-16 DIAGNOSIS — I200 Unstable angina: Secondary | ICD-10-CM | POA: Diagnosis present

## 2013-10-16 HISTORY — PX: LEFT HEART CATHETERIZATION WITH CORONARY ANGIOGRAM: SHX5451

## 2013-10-16 LAB — POTASSIUM
Potassium: 2.9 mEq/L — CL (ref 3.7–5.3)
Potassium: 3.9 mEq/L (ref 3.7–5.3)
Potassium: 3.4 mEq/L — ABNORMAL LOW (ref 3.7–5.3)

## 2013-10-16 LAB — CBC
HCT: 37.9 % (ref 36.0–46.0)
Hemoglobin: 12.6 g/dL (ref 12.0–15.0)
MCH: 29 pg (ref 26.0–34.0)
MCHC: 33.2 g/dL (ref 30.0–36.0)
MCV: 87.3 fL (ref 78.0–100.0)
Platelets: 267 10*3/uL (ref 150–400)
RBC: 4.34 MIL/uL (ref 3.87–5.11)
RDW: 15.2 % (ref 11.5–15.5)
WBC: 6.7 10*3/uL (ref 4.0–10.5)

## 2013-10-16 LAB — MRSA PCR SCREENING: MRSA BY PCR: NEGATIVE

## 2013-10-16 SURGERY — LEFT HEART CATHETERIZATION WITH CORONARY ANGIOGRAM
Anesthesia: LOCAL

## 2013-10-16 MED ORDER — METOPROLOL TARTRATE 25 MG PO TABS
25.0000 mg | ORAL_TABLET | Freq: Two times a day (BID) | ORAL | Status: DC
  Administered 2013-10-17 – 2013-10-19 (×7): 25 mg via ORAL
  Filled 2013-10-16 (×8): qty 1

## 2013-10-16 MED ORDER — POTASSIUM CHLORIDE 20 MEQ/15ML (10%) PO LIQD
40.0000 meq | Freq: Once | ORAL | Status: DC
  Filled 2013-10-16: qty 30

## 2013-10-16 MED ORDER — ATORVASTATIN CALCIUM 80 MG PO TABS: 80.0000 mg | ORAL_TABLET | Freq: Every day | ORAL | Status: DC

## 2013-10-16 MED ORDER — MIDAZOLAM HCL 2 MG/2ML IJ SOLN
INTRAMUSCULAR | Status: AC
  Filled 2013-10-16: qty 2

## 2013-10-16 MED ORDER — VERAPAMIL HCL 2.5 MG/ML IV SOLN
INTRAVENOUS | Status: AC
  Filled 2013-10-16: qty 2

## 2013-10-16 MED ORDER — ATORVASTATIN CALCIUM 80 MG PO TABS
80.0000 mg | ORAL_TABLET | Freq: Every day | ORAL | Status: DC
  Administered 2013-10-16 – 2013-10-24 (×8): 80 mg via ORAL
  Filled 2013-10-16 (×10): qty 1

## 2013-10-16 MED ORDER — DIPHENHYDRAMINE HCL 50 MG/ML IJ SOLN
INTRAMUSCULAR | Status: AC
  Filled 2013-10-16: qty 1

## 2013-10-16 MED ORDER — LIDOCAINE HCL (PF) 1 % IJ SOLN
INTRAMUSCULAR | Status: AC
  Filled 2013-10-16: qty 30

## 2013-10-16 MED ORDER — HEPARIN (PORCINE) IN NACL 100-0.45 UNIT/ML-% IJ SOLN
1300.0000 [IU]/h | INTRAMUSCULAR | Status: DC
  Administered 2013-10-17 – 2013-10-18 (×2): 1300 [IU]/h via INTRAVENOUS
  Filled 2013-10-16 (×7): qty 250

## 2013-10-16 MED ORDER — NITROGLYCERIN 0.2 MG/ML ON CALL CATH LAB
INTRAVENOUS | Status: AC
  Filled 2013-10-16: qty 1

## 2013-10-16 MED ORDER — FENTANYL CITRATE 0.05 MG/ML IJ SOLN
INTRAMUSCULAR | Status: AC
  Filled 2013-10-16: qty 2

## 2013-10-16 MED ORDER — POTASSIUM CHLORIDE 10 MEQ/100ML IV SOLN
10.0000 meq | INTRAVENOUS | Status: AC
  Administered 2013-10-16 (×4): 10 meq via INTRAVENOUS
  Filled 2013-10-16 (×4): qty 100

## 2013-10-16 MED ORDER — SODIUM CHLORIDE 0.9 % IV SOLN
INTRAVENOUS | Status: DC
  Administered 2013-10-16: 20:00:00 via INTRAVENOUS

## 2013-10-16 MED ORDER — POTASSIUM CHLORIDE CRYS ER 20 MEQ PO TBCR
40.0000 meq | EXTENDED_RELEASE_TABLET | Freq: Once | ORAL | Status: AC
  Administered 2013-10-16: 40 meq via ORAL
  Filled 2013-10-16: qty 2

## 2013-10-16 MED ORDER — ASPIRIN EC 81 MG PO TBEC: 81.0000 mg | DELAYED_RELEASE_TABLET | Freq: Every day | ORAL | Status: DC

## 2013-10-16 MED ORDER — POTASSIUM CHLORIDE CRYS ER 20 MEQ PO TBCR
40.0000 meq | EXTENDED_RELEASE_TABLET | Freq: Once | ORAL | Status: DC
  Filled 2013-10-16: qty 2

## 2013-10-16 MED ORDER — HEPARIN SODIUM (PORCINE) 1000 UNIT/ML IJ SOLN
INTRAMUSCULAR | Status: AC
  Filled 2013-10-16: qty 1

## 2013-10-16 MED ORDER — ISOSORBIDE MONONITRATE ER 30 MG PO TB24
30.0000 mg | ORAL_TABLET | Freq: Every day | ORAL | Status: DC
  Administered 2013-10-16 – 2013-10-19 (×4): 30 mg via ORAL
  Filled 2013-10-16 (×5): qty 1

## 2013-10-16 MED ORDER — HEPARIN (PORCINE) IN NACL 2-0.9 UNIT/ML-% IJ SOLN
INTRAMUSCULAR | Status: AC
  Filled 2013-10-16: qty 1500

## 2013-10-16 MED ORDER — FAMOTIDINE IN NACL 20-0.9 MG/50ML-% IV SOLN
INTRAVENOUS | Status: AC
  Filled 2013-10-16: qty 50

## 2013-10-16 NOTE — Progress Notes (Signed)
    Subjective:  No current CP. Prior "elephant sitting on my chest". K 2.9.   Objective:  Vital Signs in the last 24 hours: Temp:  [97.9 F (36.6 C)-98.4 F (36.9 C)] 98.3 F (36.8 C) (05/07 0625) Pulse Rate:  [83-88] 83 (05/07 0625) Resp:  [18-20] 20 (05/07 0625) BP: (94-136)/(60-87) 125/80 mmHg (05/07 0625) SpO2:  [98 %-100 %] 98 % (05/07 0625) Weight:  [230 lb (104.327 kg)] 230 lb (104.327 kg) (05/06 1430)  Intake/Output from previous day: 05/06 0701 - 05/07 0700 In: 440 [P.O.:240; IV Piggyback:200] Out: -    Physical Exam: General: Well developed, well nourished, in no acute distress. Mildly anxious Head:  Normocephalic and atraumatic. Lungs: Clear to auscultation and percussion. Heart: Normal S1 and S2.  No murmur, rubs or gallops.  Abdomen: soft, non-tender, positive bowel sounds. Obese Extremities: No clubbing or cyanosis. No edema. Right arm IV ? Infiltrated (crepitance) Neurologic: Alert and oriented x 3.    Lab Results:  Recent Labs  10/15/13 1933 10/16/13 0335  WBC 8.1 6.7  HGB 14.2 12.6  PLT 309 267    Recent Labs  10/15/13 0615 10/15/13 1933 10/16/13 0335  NA 141 141  --   K 3.1* 3.1* 2.9*  CL 100 101  --   CO2 25 25  --   GLUCOSE 99 98  --   BUN 9 9  --   CREATININE 0.92 0.88  --     Recent Labs  10/15/13 1332 10/15/13 1933  TROPONINI <0.30 <0.30    Imaging: Dg Chest 2 View  10/15/2013   CLINICAL DATA:  Mid and lateral chest pain, smoker.  EXAM: CHEST  2 VIEW  COMPARISON:  DG CHEST 2 VIEW dated 09/13/2011  FINDINGS: Cardiomediastinal silhouette is unremarkable. The lungs are clear without pleural effusions or focal consolidations. Trachea projects midline and there is no pneumothorax. Soft tissue planes and included osseous structures are non-suspicious. Mild degenerative change of thoracic spine. Multiple EKG lines overlie the patient and may obscure subtle underlying pathology.  IMPRESSION: No active cardiopulmonary disease.    Electronically Signed   By: Courtnay  Bloomer   On: 10/15/2013 06:48   Personally viewed.   Telemetry: No VT, +PVCs Personally viewed.   EKG:  NSR, PRWP, no st changes.   Cardiac Studies:  NUC 2010 - low risk, no ischemia. EF 73%  Assessment/Plan:  Principal Problem:   CHEST PAIN UNSPECIFIED Active Problems:   HYPERLIPIDEMIA   ANXIETY   SLEEP APNEA   DYSPNEA ON EXERTION   Chest pain  57 year old with obesity, hyperlipidemia, prior NUC in 2010 no ischemia, CAC on CT 2011, compelling family history with symptoms concerning for unstable angina.   Hypokalemia - aggressive repletion. PO and IV. Rechecking at 10am. Does not appear to be on any home meds that would have caused this. Consider hyperaldosteronism given underlying HTN.   Unstable angina - cath later today. Repleting K.   Obesity - encourage wt loss  OSA - CPAP   Jawana Reagor 10/16/2013, 8:44 AM     

## 2013-10-16 NOTE — Interval H&P Note (Signed)
Cath Lab Visit (complete for each Cath Lab visit)  Clinical Evaluation Leading to the Procedure:   ACS: no  Non-ACS:    Anginal Classification: CCS III  Anti-ischemic medical therapy: Maximal Therapy (2 or more classes of medications)  Non-Invasive Test Results: No non-invasive testing performed  Prior CABG: No previous CABG      History and Physical Interval Note:  10/16/2013 4:41 PM  Amber Wong  has presented today for surgery, with the diagnosis of cp  The various methods of treatment have been discussed with the patient and family. After consideration of risks, benefits and other options for treatment, the patient has consented to  Procedure(s): LEFT HEART CATHETERIZATION WITH CORONARY ANGIOGRAM (N/A) as a surgical intervention .  The patient's history has been reviewed, patient examined, no change in status, stable for surgery.  I have reviewed the patient's chart and labs.  Questions were answered to the patient's satisfaction.     Troy Sine

## 2013-10-16 NOTE — H&P (View-Only) (Signed)
    Subjective:  No current CP. Prior "elephant sitting on my chest". K 2.9.   Objective:  Vital Signs in the last 24 hours: Temp:  [97.9 F (36.6 C)-98.4 F (36.9 C)] 98.3 F (36.8 C) (05/07 0625) Pulse Rate:  [83-88] 83 (05/07 0625) Resp:  [18-20] 20 (05/07 0625) BP: (94-136)/(60-87) 125/80 mmHg (05/07 0625) SpO2:  [98 %-100 %] 98 % (05/07 0625) Weight:  [230 lb (104.327 kg)] 230 lb (104.327 kg) (05/06 1430)  Intake/Output from previous day: 05/06 0701 - 05/07 0700 In: 440 [P.O.:240; IV Piggyback:200] Out: -    Physical Exam: General: Well developed, well nourished, in no acute distress. Mildly anxious Head:  Normocephalic and atraumatic. Lungs: Clear to auscultation and percussion. Heart: Normal S1 and S2.  No murmur, rubs or gallops.  Abdomen: soft, non-tender, positive bowel sounds. Obese Extremities: No clubbing or cyanosis. No edema. Right arm IV ? Infiltrated (crepitance) Neurologic: Alert and oriented x 3.    Lab Results:  Recent Labs  10/15/13 1933 10/16/13 0335  WBC 8.1 6.7  HGB 14.2 12.6  PLT 309 267    Recent Labs  10/15/13 0615 10/15/13 1933 10/16/13 0335  NA 141 141  --   K 3.1* 3.1* 2.9*  CL 100 101  --   CO2 25 25  --   GLUCOSE 99 98  --   BUN 9 9  --   CREATININE 0.92 0.88  --     Recent Labs  10/15/13 1332 10/15/13 1933  TROPONINI <0.30 <0.30    Imaging: Dg Chest 2 View  10/15/2013   CLINICAL DATA:  Mid and lateral chest pain, smoker.  EXAM: CHEST  2 VIEW  COMPARISON:  DG CHEST 2 VIEW dated 09/13/2011  FINDINGS: Cardiomediastinal silhouette is unremarkable. The lungs are clear without pleural effusions or focal consolidations. Trachea projects midline and there is no pneumothorax. Soft tissue planes and included osseous structures are non-suspicious. Mild degenerative change of thoracic spine. Multiple EKG lines overlie the patient and may obscure subtle underlying pathology.  IMPRESSION: No active cardiopulmonary disease.    Electronically Signed   By: Elon Alas   On: 10/15/2013 06:48   Personally viewed.   Telemetry: No VT, +PVCs Personally viewed.   EKG:  NSR, PRWP, no st changes.   Cardiac Studies:  NUC 2010 - low risk, no ischemia. EF 73%  Assessment/Plan:  Principal Problem:   CHEST PAIN UNSPECIFIED Active Problems:   HYPERLIPIDEMIA   ANXIETY   SLEEP APNEA   DYSPNEA ON EXERTION   Chest pain  58 year old with obesity, hyperlipidemia, prior NUC in 2010 no ischemia, CAC on CT 2011, compelling family history with symptoms concerning for unstable angina.   Hypokalemia - aggressive repletion. PO and IV. Rechecking at 10am. Does not appear to be on any home meds that would have caused this. Consider hyperaldosteronism given underlying HTN.   Unstable angina - cath later today. Repleting K.   Obesity - encourage wt loss  OSA - CPAP   Candee Furbish 10/16/2013, 8:44 AM

## 2013-10-16 NOTE — CV Procedure (Signed)
Amber Wong is a 58 y.o. female   [id number]  [id number] LOCATION:  FACILITY: [place]  PHYSICIAN: Troy Sine, MD, [facility] Aug 20, 1955   DATE OF PROCEDURE:  10/16/2013     CARDIAC CATHETERIZATION    HISTORY:  Amber Wong is a 58 y.o. female who has a history of morbid obesity, hypertension, hyperlipidemia, and strong family history coronary artery disease.  She was admitted yesterday with chest discomfort for several days intermittently, which she described as a "elephant" sitting on her chest.  She now presents for definitive cardiac catheterization.   PROCEDURE:  The patient was brought to the second floor [facility]iac cath lab in the postabsorptive state. The patient was premedicated with Versed 2 mg and fentanyl 25 and due to a possible dialyses.  She also received Benadryl 50 mg intravenously and Pepcid 20 mg intravenously.  mcg. A right radial approach was utilized after an Allen's test verified adequate circulation. The right radial artery was punctured via the Seldinger technique, and a 6 [place] Glidesheath Slender was inserted without difficulty.  A radial cocktail consisting of Verapamil, IV nitroglycerin, and lidocaine was administered. 5000 units of weight adjusted heparin was administered. A safety J wire was advanced into the ascending aorta. Diagnostic catheterization was done with 5 [place] JR 4 and JL 3.5 catheters.  A 6 French SL 3.5 guiding catheter was necessary for the left coronary visualization to improved  backup support.  A 5 French pigtail catheter was used for left ventriculography. A TR radial band was applied for hemostasis.  She tolerated the procedure well. The patient left the catheterization laboratory in stable condition.   HEMODYNAMICS:   Central Aorta: 113/70  Left Ventricle: 113/6  ANGIOGRAPHY:   The left main coronary artery was a large-caliber vessel that had 80% focal distal stenosis prior to bifurcating into the bifurcated  into the LAD and left circumflex coronary artery.   The LAD had diffuse 50% proximal areas of irregularity before and after the first septal and diagonal vessel.  The vessel extended to the LV apex.   The left circumflex coronary artery was angiographically normal and gave rise to one major obtuse marginal branch.   The RCA had a superior takeoff and was angiographically normal and gave rise to a large PDA and PLA vessel.   Left ventriculography revealed  hyperdynamic LV contractility without focal segmental wall motion abnormalities. There was no evidence for mitral regurgitation.   Ejection fraction is 65%.     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  RECOMMENDATION:  Surgical consultation will be obtained for CABG revascularization surgery.    Troy Sine, MD, [facility] 10/16/2013 6:11 PM

## 2013-10-16 NOTE — Progress Notes (Signed)
Transferred in from cath lab by stretcher awake and alert. TRband to right wrist intact with 6 ml of air . + radial pulse, warm to touch, + capillary refill. Right arm elevated with pillow, pulse ox to right thumb. Another 3 ml of air deflated.  Denied any discomfort , continue to monitor.

## 2013-10-16 NOTE — Progress Notes (Addendum)
ANTICOAGULATION CONSULT NOTE - Initial Consult  Pharmacy Consult for heparin Indication: chest pain/ACS  Allergies  Allergen Reactions  . Iohexol Other (See Comments)     Code: HIVES, Desc: premedicated 2 hrs prior with 200mg  soulumedrol IV, no reaction s/p injection, Onset Date: [id number]   . Penicillins Other (See Comments)    REACTION: pruritis   Patient Measurements: Height: 5\' 8"  (172.7 cm) Weight: 230 lb (104.327 kg) IBW/kg (Calculated) : 63.9 Heparin wt =  Vital Signs: Temp: 98.2 F (36.8 C) (05/07 1527) Temp src: Oral (05/07 1527) BP: 127/88 mmHg (05/07 1915) Pulse Rate: 70 (05/07 1915)  Labs:  Recent Labs  10/15/13 0615 10/15/13 1332 10/15/13 1933 10/16/13 0335  HGB 13.7  --  14.2 12.6  HCT 41.0  --  42.2 37.9  PLT 279  --  309 267  LABPROT  --   --  14.2  --   INR  --   --  1.12  --   CREATININE 0.92  --  0.88  --   TROPONINI <0.30 <0.30 <0.30  --    Estimated Creatinine Clearance: 89.2 ml/min (by C-G formula based on Cr of 0.88).  Medical History: Past Medical History  Diagnosis Date  . Asthma   . Hypertension   . Sleep apnea   . Nocturia   . Depression   . Morbid obesity   . OSA (obstructive sleep apnea)     w/ hypoventilation  . Hypercholesteremia   . Obstructive sleep apnea (adult) (pediatric) 12/27/2012    AHI 17.7 baseline, titrated to 6 cm , AHi now 2.4 - compliance    Medications:  Scheduled:  . acyclovir  200 mg Oral q morning - 10a  . amLODipine  5 mg Oral Daily   And  . benazepril  20 mg Oral Daily  . aspirin EC  81 mg Oral Daily  . aspirin EC  81 mg Oral Daily  . atorvastatin  10 mg Oral q1800  . [START ON 10/17/2013] atorvastatin  80 mg Oral q1800  . buPROPion  100 mg Oral BID  . enoxaparin (LOVENOX) injection  100 mg Subcutaneous Q12H  . isosorbide mononitrate  30 mg Oral Daily  . metoprolol tartrate  25 mg Oral BID  . mometasone-formoterol  2 puff Inhalation BID  . montelukast  10 mg Oral QHS  . pantoprazole  40 mg Oral q  morning - 10a  . PARoxetine  20 mg Oral Daily  . sodium chloride  3 mL Intravenous Q12H   Assessment: 67 yoF presenting with chest pain/ shortness of breath. Pt was tx from [facility] for [facility] here at [facility]. Looks like severe dz so getting a CABG consultation. Restarting heparin 8hr post sheath.    Goal of Therapy:  Heparin level = 0.3-0.7 Monitor platelets by anticoagulation protocol: Yes   Plan:   Dc lovenox Start heparin at 1300 units/hr at 0230 tonight F/u with 6 hr heparin level Daily level and CBC

## 2013-10-16 NOTE — Progress Notes (Signed)
Report given to [facility] 

## 2013-10-16 NOTE — Progress Notes (Addendum)
[facility] PROGRESS NOTE  Kenslee Achorn VEH:[id number] DOB: 10/08/1955 DOA: 10/15/2013 PCP: Imelda Pillow, NP  Assessment/Plan: 1. Unstable angina -cardiac enzymes negative -cards following -for Cath today -on SQ lovenox, continue ASA/statin -FU lipids in am  2. HTN -stable, continue amlodipine, benazepril  3. Obesity  4. OSA -CPAP QHS  DVT proph: lovenox  Code Status: Full Code Family Communication: no family at bedside Disposition Plan: pending cath   Consultants:  [facility]  HPI/Subjective: Feels ok, no further chest pain  Objective: Filed Vitals:   10/16/13 0625  BP: 125/80  Pulse: 83  Temp: 98.3 F (36.8 C)  Resp: 20    Intake/Output Summary (Last 24 hours) at 10/16/13 1500 Last data filed at 10/16/13 0713  Gross per 24 hour  Intake    300 ml  Output      0 ml  Net    300 ml   Filed Weights   10/15/13 0915 10/15/13 1430  Weight: 104.327 kg (230 lb) 104.327 kg (230 lb)    Exam:   General:  AAOx3, no distress  Cardiovascular:S1S2/RRR  Respiratory: CTAB  Abdomen: soft, Nt, BS present  Musculoskeletal: no edema c/c   Data Reviewed: Basic Metabolic Panel:  Recent Labs Lab 10/15/13 0615 10/15/13 1933 10/16/13 0335 10/16/13 1015 10/16/13 1358  NA 141 141  --   --   --   K 3.1* 3.1* 2.9* 3.9 3.4*  CL 100 101  --   --   --   CO2 25 25  --   --   --   GLUCOSE 99 98  --   --   --   BUN 9 9  --   --   --   CREATININE 0.92 0.88  --   --   --   CALCIUM 9.0 9.2  --   --   --    Liver Function Tests: No results found for this basename: AST, ALT, ALKPHOS, BILITOT, PROT, ALBUMIN,  in the last 168 hours No results found for this basename: LIPASE, AMYLASE,  in the last 168 hours No results found for this basename: AMMONIA,  in the last 168 hours CBC:  Recent Labs Lab 10/15/13 0615 10/15/13 1933 10/16/13 0335  WBC 6.6 8.1 6.7  NEUTROABS 3.2  --   --   HGB 13.7 14.2 12.6  HCT 41.0 42.2 37.9  MCV 86.1 87.4 87.3   PLT 279 309 267   Cardiac Enzymes:  Recent Labs Lab 10/15/13 0615 10/15/13 1332 10/15/13 1933  TROPONINI <0.30 <0.30 <0.30   BNP (last 3 results)  Recent Labs  10/15/13 0802  PROBNP 41.4   CBG: No results found for this basename: GLUCAP,  in the last 168 hours  No results found for this or any previous visit (from the past 240 hour(s)).   Studies: Dg Chest 2 View  10/15/2013   CLINICAL DATA:  Mid and lateral chest pain, smoker.  EXAM: CHEST  2 VIEW  COMPARISON:  DG CHEST 2 VIEW dated 09/13/2011  FINDINGS: Cardiomediastinal silhouette is unremarkable. The lungs are clear without pleural effusions or focal consolidations. Trachea projects midline and there is no pneumothorax. Soft tissue planes and included osseous structures are non-suspicious. Mild degenerative change of thoracic spine. Multiple EKG lines overlie the patient and may obscure subtle underlying pathology.  IMPRESSION: No active cardiopulmonary disease.   Electronically Signed   By: Elon Alas   On: 10/15/2013 06:48    Scheduled Meds: . acyclovir  200 mg Oral q  morning - 10a  . amLODipine  5 mg Oral Daily   And  . benazepril  20 mg Oral Daily  . aspirin EC  81 mg Oral Daily  . atorvastatin  10 mg Oral q1800  . buPROPion  100 mg Oral BID  . enoxaparin (LOVENOX) injection  100 mg Subcutaneous Q12H  . mometasone-formoterol  2 puff Inhalation BID  . montelukast  10 mg Oral QHS  . pantoprazole  40 mg Oral q morning - 10a  . PARoxetine  20 mg Oral Daily  . sodium chloride  3 mL Intravenous Q12H  . sodium chloride  3 mL Intravenous Q12H   Continuous Infusions: . sodium chloride 1 mL/kg/hr (10/16/13 0402)   Antibiotics Given (last 72 hours)   Date/Time Action Medication Dose   10/16/13 0007 Given   acyclovir (ZOVIRAX) 200 MG capsule 200 mg 200 mg      Principal Problem:   Unstable angina Active Problems:   HYPERLIPIDEMIA   ANXIETY   SLEEP APNEA   DYSPNEA ON EXERTION   CHEST PAIN UNSPECIFIED    Chest pain   Obesity, unspecified   Hypokalemia    Time spent: 103min    Domenic Polite  [facility] Pager [phone]. If 7PM-7AM, please contact night-coverage at [email or web address], password [facility] 10/16/2013, 3:00 PM  LOS: 1 day

## 2013-10-16 NOTE — Progress Notes (Signed)
CRITICAL VALUE ALERT  Critical value received:  Potassium 2.9  Date of notification:  10/16/2013  Time of notification:  4599  Critical value read back:yes  Nurse who received alert:  L. Vergel de Larkin Ina RN  MD notified (1st page):  Lamar Blinks NP  Time of first page:  [phone]  MD notified (2nd page):  Time of second page:  Responding MD:  Lamar Blinks NP  Time MD responded:  [phone]

## 2013-10-17 ENCOUNTER — Other Ambulatory Visit: Payer: Self-pay | Admitting: *Deleted

## 2013-10-17 ENCOUNTER — Inpatient Hospital Stay (HOSPITAL_COMMUNITY): Payer: Medicaid Other

## 2013-10-17 DIAGNOSIS — J441 Chronic obstructive pulmonary disease with (acute) exacerbation: Secondary | ICD-10-CM | POA: Diagnosis present

## 2013-10-17 DIAGNOSIS — E785 Hyperlipidemia, unspecified: Secondary | ICD-10-CM | POA: Diagnosis present

## 2013-10-17 DIAGNOSIS — F329 Major depressive disorder, single episode, unspecified: Secondary | ICD-10-CM

## 2013-10-17 DIAGNOSIS — I2510 Atherosclerotic heart disease of native coronary artery without angina pectoris: Secondary | ICD-10-CM

## 2013-10-17 DIAGNOSIS — I5030 Unspecified diastolic (congestive) heart failure: Secondary | ICD-10-CM

## 2013-10-17 DIAGNOSIS — Z01810 Encounter for preprocedural cardiovascular examination: Secondary | ICD-10-CM

## 2013-10-17 DIAGNOSIS — I519 Heart disease, unspecified: Secondary | ICD-10-CM

## 2013-10-17 DIAGNOSIS — B009 Herpesviral infection, unspecified: Secondary | ICD-10-CM

## 2013-10-17 DIAGNOSIS — R0609 Other forms of dyspnea: Secondary | ICD-10-CM

## 2013-10-17 DIAGNOSIS — I200 Unstable angina: Secondary | ICD-10-CM

## 2013-10-17 DIAGNOSIS — R0989 Other specified symptoms and signs involving the circulatory and respiratory systems: Secondary | ICD-10-CM

## 2013-10-17 DIAGNOSIS — I509 Heart failure, unspecified: Secondary | ICD-10-CM

## 2013-10-17 DIAGNOSIS — F3289 Other specified depressive episodes: Secondary | ICD-10-CM

## 2013-10-17 LAB — CBC
HCT: 37.8 % (ref 36.0–46.0)
HCT: 37.4 % (ref 36.0–46.0)
HEMOGLOBIN: 12.3 g/dL (ref 12.0–15.0)
Hemoglobin: 12.1 g/dL (ref 12.0–15.0)
MCH: 28.7 pg (ref 26.0–34.0)
MCH: 29.1 pg (ref 26.0–34.0)
MCHC: 32 g/dL (ref 30.0–36.0)
MCHC: 32.9 g/dL (ref 30.0–36.0)
MCV: 89.6 fL (ref 78.0–100.0)
MCV: 88.4 fL (ref 78.0–100.0)
Platelets: 236 10*3/uL (ref 150–400)
Platelets: 254 10*3/uL (ref 150–400)
RBC: 4.22 MIL/uL (ref 3.87–5.11)
RBC: 4.23 MIL/uL (ref 3.87–5.11)
RDW: 15.8 % — ABNORMAL HIGH (ref 11.5–15.5)
RDW: 15.6 % — ABNORMAL HIGH (ref 11.5–15.5)
WBC: 5.8 10*3/uL (ref 4.0–10.5)
WBC: 5.7 10*3/uL (ref 4.0–10.5)

## 2013-10-17 LAB — LIPID PANEL
CHOL/HDL RATIO: 4.3 ratio
CHOLESTEROL: 191 mg/dL (ref 0–200)
CHOLESTEROL: 180 mg/dL (ref 0–200)
HDL: 44 mg/dL (ref >39)
HDL: 52 mg/dL (ref >39)
LDL Cholesterol: 130 mg/dL — ABNORMAL HIGH (ref 0–99)
LDL Cholesterol: 108 mg/dL — ABNORMAL HIGH (ref 0–99)
Total CHOL/HDL Ratio: 3.5 RATIO
Triglycerides: 87 mg/dL (ref <150)
Triglycerides: 98 mg/dL (ref <150)
VLDL: 17 mg/dL (ref 0–40)
VLDL: 20 mg/dL (ref 0–40)

## 2013-10-17 LAB — SURGICAL PCR SCREEN
MRSA, PCR: NEGATIVE
Staphylococcus aureus: NEGATIVE

## 2013-10-17 LAB — BASIC METABOLIC PANEL
BUN: 8 mg/dL (ref 6–23)
CO2: 23 meq/L (ref 19–32)
Calcium: 8.8 mg/dL (ref 8.4–10.5)
Chloride: 105 mEq/L (ref 96–112)
Creatinine, Ser: 0.85 mg/dL (ref 0.50–1.10)
GFR calc Af Amer: 86 mL/min — ABNORMAL LOW (ref >90)
GFR, EST NON AFRICAN AMERICAN: 75 mL/min — AB (ref >90)
GLUCOSE: 102 mg/dL — AB (ref 70–99)
POTASSIUM: 3.8 meq/L (ref 3.7–5.3)
Sodium: 140 mEq/L (ref 137–147)

## 2013-10-17 LAB — MAGNESIUM: Magnesium: 1.9 mg/dL (ref 1.5–2.5)

## 2013-10-17 LAB — HEMOGLOBIN A1C
HEMOGLOBIN A1C: 5.6 % (ref <5.7)
Mean Plasma Glucose: 114 mg/dL (ref <117)

## 2013-10-17 LAB — HEPARIN LEVEL (UNFRACTIONATED): HEPARIN UNFRACTIONATED: 0.45 [IU]/mL (ref 0.30–0.70)

## 2013-10-17 MED ORDER — ALPRAZOLAM 0.25 MG PO TABS
0.2500 mg | ORAL_TABLET | ORAL | Status: DC | PRN
  Administered 2013-10-18: 0.25 mg via ORAL
  Administered 2013-10-19: 0.5 mg via ORAL
  Filled 2013-10-17: qty 1
  Filled 2013-10-17: qty 2

## 2013-10-17 MED ORDER — POTASSIUM CHLORIDE CRYS ER 20 MEQ PO TBCR
40.0000 meq | EXTENDED_RELEASE_TABLET | Freq: Once | ORAL | Status: AC
  Administered 2013-10-17: 40 meq via ORAL
  Filled 2013-10-17: qty 2

## 2013-10-17 NOTE — Progress Notes (Signed)
Text page sent to [place], [place] requesting diet order for pt if possible please.  Pt currently watching tv - no c/o pain or discomfort.  Pt updated that an echo will be completed today as part of the preparation for surgery on Monday.  Pt verbalizes understanding - currently has no questions or concerns.  Will continue to closely monitor.

## 2013-10-17 NOTE — Progress Notes (Signed)
UR completed. Latishia Suitt RN CCM Case Mgmt phone [phone] 

## 2013-10-17 NOTE — Progress Notes (Signed)
[place] TEAM 1 - Stepdown/ICU TEAM Progress Note  Amber Wong TDD:[id number] DOB: [birth year] DOA: 10/15/2013 PCP: Imelda Pillow, NP  Admit HPI / Brief Narrative: Amber Wong is a 58 y.o. BF PMHx  HTN, HLD, OSA on CPAP, HLD, COPD, anxiety, family history of MIs her father at age 58 and mother at age 32, presents to the ED with a chief complaint of chest pain. Patient is an extremely poor historian and she describes to me in the ED multiple types of chest pain over various time periods. She is very non specific and vague about her symptoms; she describes to me new onset chest pain last week like "an elephant sitting on my chest", irradiating of her left arm, resolved with sitting up. She also describes chest discomfort (but different, more dull) with exertion, associated with SOB. She also has left sided chest pain every time she "eats the wrong stuff". Her pain in the ED is dull, retrosternal irradiating to her left arm, relieved to s.l. Nitroglycerin x 2. She can't tell me if she experienced pain like this in the past. It seem like her chest pressure and left arm pain/numnbess is new since last Friday. In the ED, EKG without specific ischemic changes, troponin is negative. She denies any fever or chills, denies    HPI/Subjective: 5/8 patient sitting comfortably in bed he lunch without complaint   Assessment/Plan: Diastolic CHF -BP within AHA guideline -Continue amlodipine 5 mg daily -Continue enalapril 20 mg daily -Continue metoprolol 25 mg BID -Continue Imdur 30 mg daily  Unstable angina -See diastolic CHF -Patient schedule for CABG on Monday -Obtain A1c  Multivessel coronary artery disease -See unstable angina  OSA -CPAP per respiratory  HTN -Within AHA guideline -See diastolic CHF  HLD -Continue Lipitor 80 mg daily -Obtain lipid panel  COPD -Continue albuterol nebulizer q 6hr PRN - Anxiety -Continue Xanax 0.25-0.5 mg q 4hr  PRN  Depression -Continue Paxil 20 mg daily -Continue Wellbutrin 100 mg BID  Herpes simplex without complication -Continue acyclovir    Code Status: FULL Family Communication: no family present at time of exam Disposition Plan: Per cardiology/cardiothoracic surgery    Consultants: Dr. Candee Furbish (cardiology) Dr. Tharon Aquas Trigt (cardiothoracic surgery)   Procedure/Significant Events: 10/16/2013 cardiac catheterization -Hyperdynamic LV function  -Severe 80% distal left main stenosis with 50% diffuse proximal LAD stenoses  Echocardiogram 10/17/2013 - LVEF= 55% - 60%.  -(grade 1 diastolic dysfunction). - Pulmonary arteries: PA peak pressure: 48mm Hg (S).      Culture N./A.  Antibiotics: N./A.  DVT prophylaxis: Heparin infusion   Devices N./A.   LINES / TUBES:  5/6 22 GA left forearm 5/7 20 GA right antecubital    Continuous Infusions: . sodium chloride 80 mL/hr at 10/17/13 0600  . heparin 1,300 Units/hr (10/17/13 0600)    Objective: VITAL SIGNS: Temp: 97.9 F (36.6 C) (05/08 1526) Temp src: Oral (05/08 1526) BP: 104/63 mmHg (05/08 1526) Pulse Rate: 69 (05/08 1130) SPO2; FIO2:   Intake/Output Summary (Last 24 hours) at 10/17/13 2013 Last data filed at 10/17/13 1800  Gross per 24 hour  Intake 2301.5 ml  Output      0 ml  Net 2301.5 ml     Exam: General: A./O. x4, NAD, No acute respiratory distress Lungs: Clear to auscultation bilaterally without wheezes or crackles Cardiovascular: Regular rate and rhythm without murmur gallop or rub normal S1 and S2 Abdomen: Nontender, nondistended, soft, bowel sounds positive, no rebound, no ascites, no appreciable mass Extremities:  No significant cyanosis, clubbing, or edema bilateral lower extremities  Data Reviewed: Basic Metabolic Panel:  Recent Labs Lab 10/15/13 0615 10/15/13 1933 10/16/13 0335 10/16/13 1015 10/16/13 1358 10/17/13 1437  NA 141 141  --   --   --  140  K 3.1* 3.1* 2.9* 3.9  3.4* 3.8  CL 100 101  --   --   --  105  CO2 25 25  --   --   --  23  GLUCOSE 99 98  --   --   --  102*  BUN 9 9  --   --   --  8  CREATININE 0.92 0.88  --   --   --  0.85  CALCIUM 9.0 9.2  --   --   --  8.8  MG  --   --   --   --   --  1.9   Liver Function Tests: No results found for this basename: AST, ALT, ALKPHOS, BILITOT, PROT, ALBUMIN,  in the last 168 hours No results found for this basename: LIPASE, AMYLASE,  in the last 168 hours No results found for this basename: AMMONIA,  in the last 168 hours CBC:  Recent Labs Lab 10/15/13 0615 10/15/13 1933 10/16/13 0335 10/17/13 0338 10/17/13 0920  WBC 6.6 8.1 6.7 5.8 5.7  NEUTROABS 3.2  --   --   --   --   HGB 13.7 14.2 12.6 12.1 12.3  HCT 41.0 42.2 37.9 37.8 37.4  MCV 86.1 87.4 87.3 89.6 88.4  PLT 279 309 267 236 254   Cardiac Enzymes:  Recent Labs Lab 10/15/13 0615 10/15/13 1332 10/15/13 1933  TROPONINI <0.30 <0.30 <0.30   BNP (last 3 results)  Recent Labs  10/15/13 0802  PROBNP 41.4   CBG: No results found for this basename: GLUCAP,  in the last 168 hours  Recent Results (from the past 240 hour(s))  MRSA PCR SCREENING     Status: None   Collection Time    10/16/13  8:08 PM      Result Value Ref Range Status   MRSA by PCR NEGATIVE  NEGATIVE Final   Comment:            The GeneXpert MRSA Assay (FDA     approved for NASAL specimens     only), is one component of a     comprehensive MRSA colonization     surveillance program. It is not     intended to diagnose MRSA     infection nor to guide or     monitor treatment for     MRSA infections.  SURGICAL PCR SCREEN     Status: None   Collection Time    10/17/13  9:43 AM      Result Value Ref Range Status   MRSA, PCR NEGATIVE  NEGATIVE Final   Staphylococcus aureus NEGATIVE  NEGATIVE Final   Comment:            The Xpert SA Assay (FDA     approved for NASAL specimens     in patients over 52 years of age),     is one component of     a comprehensive  surveillance     program.  Test performance has     been validated by [employer] for patients greater     than or equal to 76 year old.     It is not intended  to diagnose infection nor to     guide or monitor treatment.     Studies:  Recent x-ray studies have been reviewed in detail by the Attending Physician  Scheduled Meds:  Scheduled Meds: . acyclovir  200 mg Oral q morning - 10a  . amLODipine  5 mg Oral Daily   And  . benazepril  20 mg Oral Daily  . aspirin EC  81 mg Oral Daily  . atorvastatin  80 mg Oral q1800  . buPROPion  100 mg Oral BID  . isosorbide mononitrate  30 mg Oral Daily  . metoprolol tartrate  25 mg Oral BID  . mometasone-formoterol  2 puff Inhalation BID  . montelukast  10 mg Oral QHS  . pantoprazole  40 mg Oral q morning - 10a  . PARoxetine  20 mg Oral Daily  . sodium chloride  3 mL Intravenous Q12H    Time spent on care of this patient: 40 mins   Allie Bossier , MD   [facility] Office  [phone] Pager [phone]  On-Call/Text Page:      [email or web address]      password [email or web address]  If 7PM-7AM, please contact night-coverage [email or web address] Password [email or web address] 10/17/2013, 8:13 PM   LOS: 2 days

## 2013-10-17 NOTE — Progress Notes (Signed)
CT surgery  Consult received Will setup CABG for Mon Full consult pending

## 2013-10-17 NOTE — Progress Notes (Signed)
SUBJECTIVE:  No chest pain.  No SOB   PHYSICAL EXAM Filed Vitals:   10/17/13 0800 10/17/13 0900 10/17/13 1000 10/17/13 1130  BP: 139/80 126/73 131/64 128/61  Pulse: 75 63 79 69  Temp: 98.2 F (36.8 C)     TempSrc: Oral     Resp: 17 18 14 17   Height:      Weight:      SpO2: 96% 92% 99% 97%   General:  No distress Lungs:  Clear Heart:  RRR Abdomen:  Positive bowel sounds, no rebound no guarding Extremities:  No edema, right wrist OK  LABS: Lab Results  Component Value Date   TROPONINI <0.30 10/15/2013   Results for orders placed during the hospital encounter of 10/15/13 (from the past 24 hour(s))  POTASSIUM     Status: Abnormal   Collection Time    10/16/13  1:58 PM      Result Value Ref Range   Potassium 3.4 (*) 3.7 - 5.3 mEq/L  MRSA PCR SCREENING     Status: None   Collection Time    10/16/13  8:08 PM      Result Value Ref Range   MRSA by PCR NEGATIVE  NEGATIVE  CBC     Status: Abnormal   Collection Time    10/17/13  3:38 AM      Result Value Ref Range   WBC 5.8  4.0 - 10.5 K/uL   RBC 4.22  3.87 - 5.11 MIL/uL   Hemoglobin 12.1  12.0 - 15.0 g/dL   HCT 37.8  36.0 - 46.0 %   MCV 89.6  78.0 - 100.0 fL   MCH 28.7  26.0 - 34.0 pg   MCHC 32.0  30.0 - 36.0 g/dL   RDW 15.8 (*) 11.5 - 15.5 %   Platelets 236  150 - 400 K/uL  LIPID PANEL     Status: Abnormal   Collection Time    10/17/13  3:38 AM      Result Value Ref Range   Cholesterol 191  0 - 200 mg/dL   Triglycerides 87  <150 mg/dL   HDL 44  >39 mg/dL   Total CHOL/HDL Ratio 4.3     VLDL 17  0 - 40 mg/dL   LDL Cholesterol 130 (*) 0 - 99 mg/dL  HEPARIN LEVEL (UNFRACTIONATED)     Status: None   Collection Time    10/17/13  9:20 AM      Result Value Ref Range   Heparin Unfractionated 0.45  0.30 - 0.70 IU/mL  CBC     Status: Abnormal   Collection Time    10/17/13  9:20 AM      Result Value Ref Range   WBC 5.7  4.0 - 10.5 K/uL   RBC 4.23  3.87 - 5.11 MIL/uL   Hemoglobin 12.3  12.0 - 15.0 g/dL   HCT 37.4   36.0 - 46.0 %   MCV 88.4  78.0 - 100.0 fL   MCH 29.1  26.0 - 34.0 pg   MCHC 32.9  30.0 - 36.0 g/dL   RDW 15.6 (*) 11.5 - 15.5 %   Platelets 254  150 - 400 K/uL    Intake/Output Summary (Last 24 hours) at 10/17/13 1156 Last data filed at 10/17/13 1100  Gross per 24 hour  Intake 1769.8 ml  Output      0 ml  Net 1769.8 ml    ASSESSMENT AND PLAN:  CAD:  LM stenosis.  CABG  on Monday.    HTN:  BP OK.  Continue current meds.   HYPERLIPIDEMIA:  LDL 130.    Now on Lipitor.    OBSTRUCTIVE SLEEP APNEA:  HYPOKALEMIA:   Supplement.      Jeneen Rinks [facility] 10/17/2013 11:56 AM

## 2013-10-17 NOTE — Progress Notes (Signed)
ANTICOAGULATION CONSULT NOTE - Follow Up  Pharmacy Consult for heparin Indication: chest pain/ACS  Allergies  Allergen Reactions  . Iohexol Other (See Comments)     Code: HIVES, Desc: premedicated 2 hrs prior with 200mg  soulumedrol IV, no reaction s/p injection, Onset Date: 24401027   . Penicillins Other (See Comments)    REACTION: pruritis   Patient Measurements: Height: 5\' 8"  (172.7 cm) Weight: 230 lb (104.327 kg) IBW/kg (Calculated) : 63.9 Heparin wt =  Vital Signs: Temp: 98.2 F (36.8 C) (05/08 0800) Temp src: Oral (05/08 0800) BP: 128/61 mmHg (05/08 1130) Pulse Rate: 69 (05/08 1130)  Labs:  Recent Labs  10/15/13 0615 10/15/13 1332 10/15/13 1933 10/16/13 0335 10/17/13 0338 10/17/13 0920  HGB 13.7  --  14.2 12.6 12.1 12.3  HCT 41.0  --  42.2 37.9 37.8 37.4  PLT 279  --  309 267 236 254  LABPROT  --   --  14.2  --   --   --   INR  --   --  1.12  --   --   --   HEPARINUNFRC  --   --   --   --   --  0.45  CREATININE 0.92  --  0.88  --   --   --   TROPONINI <0.30 <0.30 <0.30  --   --   --    Estimated Creatinine Clearance: 89.2 ml/min (by C-G formula based on Cr of 0.88).  Medical History: Past Medical History  Diagnosis Date  . Asthma   . Hypertension   . Sleep apnea   . Nocturia   . Depression   . Morbid obesity   . OSA (obstructive sleep apnea)     w/ hypoventilation  . Hypercholesteremia   . Obstructive sleep apnea (adult) (pediatric) 12/27/2012    AHI 17.7 baseline, titrated to 6 cm , AHi now 2.4 - compliance    Medications:  Scheduled:  . acyclovir  200 mg Oral q morning - 10a  . amLODipine  5 mg Oral Daily   And  . benazepril  20 mg Oral Daily  . aspirin EC  81 mg Oral Daily  . atorvastatin  80 mg Oral q1800  . buPROPion  100 mg Oral BID  . isosorbide mononitrate  30 mg Oral Daily  . metoprolol tartrate  25 mg Oral BID  . mometasone-formoterol  2 puff Inhalation BID  . montelukast  10 mg Oral QHS  . pantoprazole  40 mg Oral q morning -  10a  . PARoxetine  20 mg Oral Daily  . sodium chloride  3 mL Intravenous Q12H   Assessment: 5 yoF presenting with chest pain/ shortness of breath. Pt was [facility]. She was found to have severe dz and plans for CABG on Monday noted.  Her heparin level is 0.45 IU/ml which is within the desired goal range without noted bleeding complications.    Goal of Therapy:  Heparin level = 0.3-0.7 Monitor platelets by anticoagulation protocol: Yes   Plan:  Continue IV heparin at 1300 units/hr F/u daily level and CBC Monitor for bleeding complications  Rober Minion, PharmD., MS Clinical Pharmacist Pager:  [phone] Thank you for allowing pharmacy to be part of this patients care team.

## 2013-10-17 NOTE — Progress Notes (Addendum)
Pt requesting advance directive information - chaplin paged per hospital protocol.  Also, paged [place], [place] regarding questions pt and pt's son have regarding surgery scheduled for Monday.  Pt currently visiting with family - no c/o pain or discomfort.  Will continue to closely monitor.

## 2013-10-17 NOTE — Progress Notes (Signed)
[id number] Cardiac Rehab Completed pre-op [facility] education with pt. She voices understanding. Pt states that her son and daughter-in law will be abe to provide 24 hour care for her at discharge after surgery. I gave pt going for heart surgery booklet and pt care guide. Deon Pilling, RN 10/17/2013 2:06 PM

## 2013-10-17 NOTE — Progress Notes (Addendum)
Pre-op Cardiac Surgery  Carotid Findings:  Bilateral:  1-39% ICA stenosis.  Vertebral artery flow is antegrade.      Upper Extremity Right Left  Brachial Pressures 107 Triphasic 108 Triphasic  Radial Waveforms Triphasic Triphasic  Ulnar Waveforms Triphasic Triphasic  Palmar Arch (Allen's Test) Abnormal Abnormal   Findings:   Doppler waveforms reverse with radial compression and remain normal with ulnar compression on the right. Left Doppler waveforms remained normal with radial compression and reversed with ulnar compression   Lower  Extremity Right Left  Dorsalis Pedis    Anterior Tibial    Posterior Tibial    Ankle/Brachial Indices      Findings:  Palpable pedal pulses bilaterally.  [facility], [place] 10/19/2013 1:02 PM

## 2013-10-17 NOTE — Progress Notes (Signed)
  Echocardiogram 2D Echocardiogram has been performed.  [place] 10/17/2013, 5:48 PM

## 2013-10-18 DIAGNOSIS — I10 Essential (primary) hypertension: Secondary | ICD-10-CM

## 2013-10-18 DIAGNOSIS — I2510 Atherosclerotic heart disease of native coronary artery without angina pectoris: Secondary | ICD-10-CM

## 2013-10-18 DIAGNOSIS — G4733 Obstructive sleep apnea (adult) (pediatric): Secondary | ICD-10-CM

## 2013-10-18 LAB — BASIC METABOLIC PANEL
BUN: 9 mg/dL (ref 6–23)
CALCIUM: 9 mg/dL (ref 8.4–10.5)
CO2: 22 mEq/L (ref 19–32)
CREATININE: 0.91 mg/dL (ref 0.50–1.10)
Chloride: 104 mEq/L (ref 96–112)
GFR, EST AFRICAN AMERICAN: 80 mL/min — AB (ref >90)
GFR, EST NON AFRICAN AMERICAN: 69 mL/min — AB (ref >90)
Glucose, Bld: 93 mg/dL (ref 70–99)
Potassium: 3.7 mEq/L (ref 3.7–5.3)
Sodium: 140 mEq/L (ref 137–147)

## 2013-10-18 LAB — MAGNESIUM: MAGNESIUM: 1.9 mg/dL (ref 1.5–2.5)

## 2013-10-18 LAB — CBC
HCT: 40.6 % (ref 36.0–46.0)
HEMOGLOBIN: 13.1 g/dL (ref 12.0–15.0)
MCH: 28.7 pg (ref 26.0–34.0)
MCHC: 32.3 g/dL (ref 30.0–36.0)
MCV: 89 fL (ref 78.0–100.0)
PLATELETS: 278 10*3/uL (ref 150–400)
RBC: 4.56 MIL/uL (ref 3.87–5.11)
RDW: 15.4 % (ref 11.5–15.5)
WBC: 7.2 10*3/uL (ref 4.0–10.5)

## 2013-10-18 LAB — HEMOGLOBIN A1C
HEMOGLOBIN A1C: 5.9 % — AB (ref <5.7)
MEAN PLASMA GLUCOSE: 123 mg/dL — AB (ref <117)

## 2013-10-18 LAB — HEPARIN LEVEL (UNFRACTIONATED): HEPARIN UNFRACTIONATED: 0.54 [IU]/mL (ref 0.30–0.70)

## 2013-10-18 MED ORDER — ACETAMINOPHEN 500 MG PO TABS
500.0000 mg | ORAL_TABLET | ORAL | Status: DC | PRN
  Administered 2013-10-18 – 2013-10-19 (×2): 500 mg via ORAL
  Filled 2013-10-18 (×2): qty 1

## 2013-10-18 NOTE — Progress Notes (Signed)
Pre and post CABG teaching done . Nutrition and post surgery acivity limitations also dicussed . the patient verbalized understanding . Booklet ( for heart surgery) provided . Video's currently out of order .

## 2013-10-18 NOTE — Progress Notes (Signed)
SUBJECTIVE:  She had mild chest tightness this am.  Presently she is pain free.  Denies SOB.   PHYSICAL EXAM Filed Vitals:   10/18/13 0927 10/18/13 0938 10/18/13 1021 10/18/13 1159  BP: 139/77 125/63  109/70  Pulse:      Temp:    97.5 F (36.4 C)  TempSrc:    Oral  Resp:      Height:      Weight:      SpO2:   96% 99%   General:  A&Ox3 No distress OP clear Neck supple Lungs:  Clear Heart:  RRR Abdomen:  Positive bowel sounds, no rebound no guarding Extremities:  No edema, right wrist OK Neuro- strength/sensation are intact  LABS: Lab Results  Component Value Date   TROPONINI <0.30 10/15/2013   Results for orders placed during the hospital encounter of 10/15/13 (from the past 24 hour(s))  BASIC METABOLIC PANEL     Status: Abnormal   Collection Time    10/17/13  2:37 PM      Result Value Ref Range   Sodium 140  137 - 147 mEq/L   Potassium 3.8  3.7 - 5.3 mEq/L   Chloride 105  96 - 112 mEq/L   CO2 23  19 - 32 mEq/L   Glucose, Bld 102 (*) 70 - 99 mg/dL   BUN 8  6 - 23 mg/dL   Creatinine, Ser 0.85  0.50 - 1.10 mg/dL   Calcium 8.8  8.4 - 10.5 mg/dL   GFR calc non Af Amer 75 (*) >90 mL/min   GFR calc Af Amer 86 (*) >90 mL/min  MAGNESIUM     Status: None   Collection Time    10/17/13  2:37 PM      Result Value Ref Range   Magnesium 1.9  1.5 - 2.5 mg/dL  HEMOGLOBIN A1C     Status: None   Collection Time    10/17/13  2:37 PM      Result Value Ref Range   Hemoglobin A1C 5.6  <5.7 %   Mean Plasma Glucose 114  <117 mg/dL  LIPID PANEL     Status: Abnormal   Collection Time    10/17/13 10:44 PM      Result Value Ref Range   Cholesterol 180  0 - 200 mg/dL   Triglycerides 98  <150 mg/dL   HDL 52  >39 mg/dL   Total CHOL/HDL Ratio 3.5     VLDL 20  0 - 40 mg/dL   LDL Cholesterol 108 (*) 0 - 99 mg/dL  CBC     Status: None   Collection Time    10/18/13  4:20 AM      Result Value Ref Range   WBC 7.2  4.0 - 10.5 K/uL   RBC 4.56  3.87 - 5.11 MIL/uL   Hemoglobin 13.1   12.0 - 15.0 g/dL   HCT 40.6  36.0 - 46.0 %   MCV 89.0  78.0 - 100.0 fL   MCH 28.7  26.0 - 34.0 pg   MCHC 32.3  30.0 - 36.0 g/dL   RDW 15.4  11.5 - 15.5 %   Platelets 278  150 - 400 K/uL  HEPARIN LEVEL (UNFRACTIONATED)     Status: None   Collection Time    10/18/13  4:20 AM      Result Value Ref Range   Heparin Unfractionated 0.54  0.30 - 0.70 IU/mL  BASIC METABOLIC PANEL     Status: Abnormal  Collection Time    10/18/13  4:20 AM      Result Value Ref Range   Sodium 140  137 - 147 mEq/L   Potassium 3.7  3.7 - 5.3 mEq/L   Chloride 104  96 - 112 mEq/L   CO2 22  19 - 32 mEq/L   Glucose, Bld 93  70 - 99 mg/dL   BUN 9  6 - 23 mg/dL   Creatinine, Ser 0.91  0.50 - 1.10 mg/dL   Calcium 9.0  8.4 - 10.5 mg/dL   GFR calc non Af Amer 69 (*) >90 mL/min   GFR calc Af Amer 80 (*) >90 mL/min  MAGNESIUM     Status: None   Collection Time    10/18/13  4:20 AM      Result Value Ref Range   Magnesium 1.9  1.5 - 2.5 mg/dL    Intake/Output Summary (Last 24 hours) at 10/18/13 1220 Last data filed at 10/18/13 1200  Gross per 24 hour  Intake   1189 ml  Output      0 ml  Net   1189 ml    ASSESSMENT AND PLAN:  CAD:  LM stenosis.  CABG on Monday.  Continue IV heparin.  Will keep in TCU for now.  HTN:  BP OK.  Continue current meds.   HYPERLIPIDEMIA:  LDL 130.    Now on Lipitor.    OBSTRUCTIVE SLEEP APNEA:   Thompson Grayer 10/18/2013 12:20 PM

## 2013-10-18 NOTE — Consult Note (Signed)
[address]       ,[place] [address]             [phone]        Amber Wong [place] [facility] Medical Record #[id number] Date of Birth: 12-31-1955  Referring: No ref. provider found Primary Care: Imelda Pillow, NP  Chief Complaint:    Chief Complaint  Patient presents with  . Chest Pain   patient examined, coronary angiogram and 2-D echocardiogram reviewed   History of Present Illness:     58 year old African American obese female presents with symptoms of unstable angina which have been progressive in frequency and intensity over the past 3 days. Some of the pain is related to her position. She is no prior history of coronary disease, cardiac arrhythmia but has a strong family history of CAD, MI. She is a reformed smoker and has hyperlipidemia hypertension and sleep apnea. Although her initial troponin was negative she was felt to have possible unstable angina and underwent nuclear myocardial perfusion scan which was low risk with EF of 73%. Her chest pain persisted and she underwent cardiac catheterization by Dr. Georgina Peer which demonstrated an 80% left main stenosis and moderate proximal LAD stenosis. RCA was not significantly diseased. LVEF was normal. Because of her left main stenosis as well as her symptoms of unstable angina she is felt a candidate for CABG.   Current Activity/ Functional Status: Patient lives alone is able to take care of herself in the apartment does have difficulty climbing stairs because of shortness of breath and leg pain   Zubrod Score: At the time of surgery this patient's most appropriate activity status/level should be described as: []     0    Normal activity, no symptoms []     1    Restricted in physical strenuous activity but ambulatory, able to do out light work [x]     2    Ambulatory and capable of self care, unable to do work activities, up and about                 more than 50%  Of the time                             []     3    Only limited self care, in bed greater than 50% of waking hours []     4    Completely disabled, no self care, confined to bed or chair []     5    Moribund  Past Medical History  Diagnosis Date  . Asthma   . Hypertension   . Sleep apnea   . Nocturia   . Depression   . Morbid obesity   . OSA (obstructive sleep apnea)     w/ hypoventilation  . Hypercholesteremia   . Obstructive sleep apnea (adult) (pediatric) 12/27/2012    AHI 17.7 baseline, titrated to 6 cm , AHi now 2.4 - compliance     Past Surgical History  Procedure Laterality Date  . Partial hysterectomy    . Knee surgery      chronic pain and limping since  . Cystectomy    . Breast lumpectomy Right     benign at age 15  . Tonsillectomy      History  Smoking status  . Former Smoker -- 15 years  . Types: Cigarettes  Smokeless tobacco  . Never Used  Comment: Quit 2005    History  Alcohol Use No    History   Social History  . Marital Status: Married    Spouse Name: N/A    Number of Children: N/A  . Years of Education: 12   Occupational History  .      history of shift work   Social History Main Topics  . Smoking status: Former Smoker -- 15 years    Types: Cigarettes  . Smokeless tobacco: Never Used     Comment: Quit 2005  . Alcohol Use: No  . Drug Use: No  . Sexual Activity: No   Other Topics Concern  . Not on file   Social History Narrative   Patient lives at home alone.     Allergies  Allergen Reactions  . Iohexol Other (See Comments)     Code: HIVES, Desc: premedicated 2 hrs prior with 200mg  soulumedrol IV, no reaction s/p injection, Onset Date: 16109604   . Penicillins Other (See Comments)    REACTION: pruritis    Current Facility-Administered Medications  Medication Dose Route Frequency Provider Last Rate Last Dose  . 0.9 %  sodium chloride infusion   Intravenous Continuous Lennette Bihari, MD 80 mL/hr at 10/17/13 0600    . acetaminophen (TYLENOL) tablet 500 mg   500 mg Oral Q4H PRN Herby Abraham Edmisten, PA-C   500 mg at 10/18/13 1333  . acyclovir (ZOVIRAX) 200 MG capsule 200 mg  200 mg Oral q morning - 10a Roma Kayser Schorr, NP   200 mg at 10/18/13 0931  . albuterol (PROVENTIL) (2.5 MG/3ML) 0.083% nebulizer solution 2.5 mg  2.5 mg Inhalation Q6H PRN Leatha Gilding, MD      . ALPRAZolam Prudy Feeler) tablet 0.25-0.5 mg  0.25-0.5 mg Oral Q4H PRN Kerin Perna, MD   0.25 mg at 10/18/13 0931  . amLODipine (NORVASC) tablet 5 mg  5 mg Oral Daily Costin Otelia Sergeant, MD   5 mg at 10/18/13 0931   And  . benazepril (LOTENSIN) tablet 20 mg  20 mg Oral Daily Leatha Gilding, MD   20 mg at 10/18/13 0931  . aspirin EC tablet 81 mg  81 mg Oral Daily Leatha Gilding, MD   81 mg at 10/18/13 0932  . atorvastatin (LIPITOR) tablet 80 mg  80 mg Oral q1800 Lennette Bihari, MD   80 mg at 10/17/13 1740  . buPROPion [facility] SR) 12 hr tablet 100 mg  100 mg Oral BID Leatha Gilding, MD   100 mg at 10/18/13 0931  . heparin ADULT infusion 100 units/mL (25000 units/250 mL)  1,300 Units/hr Intravenous Continuous Lennette Bihari, MD 13 mL/hr at 10/17/13 0600 1,300 Units/hr at 10/17/13 0600  . isosorbide mononitrate (IMDUR) 24 hr tablet 30 mg  30 mg Oral Daily Lennette Bihari, MD   30 mg at 10/18/13 0932  . metoprolol tartrate (LOPRESSOR) tablet 25 mg  25 mg Oral BID Lennette Bihari, MD   25 mg at 10/18/13 0932  . mometasone-formoterol (DULERA) 100-5 MCG/ACT inhaler 2 puff  2 puff Inhalation BID Leatha Gilding, MD   2 puff at 10/18/13 1021  . montelukast (SINGULAIR) tablet 10 mg  10 mg Oral QHS Leatha Gilding, MD   10 mg at 10/17/13 2211  . nitroGLYCERIN (NITROSTAT) SL tablet 0.4 mg  0.4 mg Sublingual Q5 min PRN Olivia Mackie, MD   0.4 mg at 10/18/13 0930  . pantoprazole (PROTONIX) EC tablet 40 mg  40 mg Oral q morning - 10a Caren Griffins, MD   40 mg at 10/18/13 0939  . PARoxetine (PAXIL) tablet 20 mg  20 mg Oral Daily Costin Karlyne Greenspan, MD   20 mg at 10/18/13 0933  . sodium chloride  (OCEAN) 0.65 % nasal spray 1 spray  1 spray Each Nare QID PRN Costin Karlyne Greenspan, MD      . sodium chloride 0.9 % injection 3 mL  3 mL Intravenous Q12H Costin Karlyne Greenspan, MD   3 mL at 10/18/13 6063    Prescriptions prior to admission  Medication Sig Dispense Refill  . acyclovir (ZOVIRAX) 200 MG capsule Take 200 mg by mouth every morning.      Marland Kitchen albuterol (PROVENTIL HFA;VENTOLIN HFA) 108 (90 BASE) MCG/ACT inhaler Inhale 2 puffs into the lungs every 6 (six) hours as needed for wheezing.       Marland Kitchen amLODipine-benazepril (LOTREL) 5-20 MG per capsule Take 1 capsule by mouth every morning.      Marland Kitchen aspirin 81 MG chewable tablet Chew 81 mg by mouth every morning.      Marland Kitchen buPROPion (WELLBUTRIN SR) 100 MG 12 hr tablet Take 100 mg by mouth 2 (two) times daily.      . cholecalciferol (VITAMIN D) 1000 UNITS tablet Take 1,000 Units by mouth every morning.      . Fluticasone-Salmeterol (ADVAIR) 100-50 MCG/DOSE AEPB Inhale 1 puff into the lungs every 12 (twelve) hours.      . Fluticasone-Salmeterol (ADVAIR) 100-50 MCG/DOSE AEPB Inhale 1 puff into the lungs 2 (two) times daily.      . indomethacin (INDOCIN) 50 MG capsule Take 50 mg by mouth 2 (two) times daily with a meal.      . montelukast (SINGULAIR) 10 MG tablet Take 10 mg by mouth at bedtime.      . pantoprazole (PROTONIX) 40 MG tablet Take 40 mg by mouth every morning.      Marland Kitchen PARoxetine (PAXIL) 20 MG tablet Take 20 mg by mouth every morning.      . sodium chloride (OCEAN) 0.65 % SOLN nasal spray Place 1 spray into both nostrils 4 (four) times daily as needed for congestion.      . vitamin E 400 UNIT capsule Take 400 Units by mouth daily.        Family History  Problem Relation Age of Onset  . Cancer Sister     double mastectomy  . Heart attack Sister     2 sisters in their 33's with MI/stents  . Heart attack Mother 49    died of MI  . Heart attack Father 61    died of MI     Review of Systems:  Patient lives alone in an apartment with her friends  and family close by She is right-hand dominant She denies any previous thoracic trauma or pneumothorax She denies any bleeding problems or difficulty with anesthesia from her multiple previous surgical procedures as noted above She denies previous diagnosis of diabetes She complains of some cramping in her lower legs which got worse recently She denies diabetic ulcers of her feet or neuropathy Her carotid duplex scan showed no significant carotid disease     Cardiac Review of Systems: Y or N  Chest Pain [  Y.  ]  Resting SOB [  N. ] Exertional SOB  [Y.  ]  Orthopnea [N.  ]   Pedal Edema [ N.  ]    Palpitations [Y.  ] Syncope  [  N.]   Presyncope [ N.  ]  General Review of Systems: [Y] = yes [  ]=no Constitional: recent weight change [  ]; anorexia [  ]; fatigue [  ]; nausea [  ]; night sweats [  ]; fever [  ]; or chills [  ]                                                               Dental: poor dentition[  ]; Last Dentist visit:   Eye : blurred vision [  ]; diplopia [   ]; vision changes [  ];  Amaurosis fugax[  ]; Resp: cough [  ];  wheezing[  ];  hemoptysis[  ]; shortness of breath[ Y. ]; paroxysmal nocturnal dyspnea[  ]; dyspnea on exertion[Y.  ]; or orthopnea[  ];  GI:  gallstones[  ], vomiting[  ];  dysphagia[  ]; melena[  ];  hematochezia [  ]; heartburn[  ];   Hx of  Colonoscopy[  ]; GU: kidney stones [  ]; hematuria[  ];   dysuria [  ];  nocturia[  ];  history of     obstruction [  ]; urinary frequency [  ]             Skin: rash, swelling[  ];, hair loss[  ];  peripheral edema[  ];  or itching[  ]; Musculosketetal: myalgias[  ];  joint swelling[  ];  joint erythema[  ];  joint pain[  ];  back pain[  ];  Heme/Lymph: bruising[  ];  bleeding[  ];  anemia[  ];  Neuro: TIA[  ];  headaches[  ];  stroke[  ];  vertigo[  ];  seizures[  ];   paresthesias[  ];  difficulty walking[  ];  Psych:depression[  ]; anxiety[  ];  Endocrine: diabetes[  ];  thyroid dysfunction[  ];  Immunizations:  Flu [  ]; Pneumococcal[  ];  Other:  Physical Exam: BP 109/70  Pulse 70  Temp(Src) 97.5 F (36.4 C) (Oral)  Resp 24  Ht 5\' 8"  (1.727 m)  Wt 230 lb (104.327 kg)  BMI 34.98 kg/m2  SpO2 99%  Gen.-middle-aged after American female no acute distress in CCU accompanied by family HEENT normocephalic dentition adequate Neck without JVD mass or bruit Lymphatics nonpalpable in the neck or supralevator fossa Thorax without tenderness deformity and with clear breath sounds Cardiac regular the without murmur or gallop Abdomen obese soft nontender without pulsatile mass Extremities without clubbing cyanosis edema or tenderness, no hematoma in the right groin from cardiac cath Vascular palpable pulses in extremities, no evidence of venous insufficiency of her lower extremities Neuro without focal motor deficit   Diagnostic Studies & Laboratory data:     Recent Radiology Findings:   No results found.    Recent Lab Findings: Lab Results  Component Value Date   WBC 7.2 10/18/2013   HGB 13.1 10/18/2013   HCT 40.6 10/18/2013   PLT 278 10/18/2013   GLUCOSE 93 10/18/2013   CHOL 180 10/17/2013   TRIG 98 10/17/2013   HDL 52 10/17/2013   LDLCALC 108* 10/17/2013   ALT 42* 02/18/2010   AST 40* 02/18/2010   NA 140 10/18/2013   K 3.7 10/18/2013   CL 104 10/18/2013  CREATININE 0.91 10/18/2013   BUN 9 10/18/2013   CO2 22 10/18/2013   TSH 1.934 02/18/2010   INR 1.12 10/15/2013   HGBA1C 5.9* 10/17/2013      Assessment / Plan:      Middle-aged Afro-American female with unstable angina and left main stenosis. She will be prepared for multivessel CABG on Monday may 11. Procedure indications benefits alternatives and risk discussed with patient and she demonstrates her understanding and agreed to proceed with surgery.      @ME1 @ 10/18/2013 2:22 PM

## 2013-10-18 NOTE — Progress Notes (Signed)
ANTICOAGULATION CONSULT NOTE - Follow Up  Pharmacy Consult for heparin Indication: chest pain/ACS  Allergies  Allergen Reactions  . Iohexol Other (See Comments)     Code: HIVES, Desc: premedicated 2 hrs prior with 200mg  soulumedrol IV, no reaction s/p injection, Onset Date: 24580998   . Penicillins Other (See Comments)    REACTION: pruritis   Patient Measurements: Height: 5\' 8"  (172.7 cm) Weight: 230 lb (104.327 kg) IBW/kg (Calculated) : 63.9 Heparin wt =  Vital Signs: Temp: 98.2 F (36.8 C) (05/09 0757) Temp src: Oral (05/09 0757) BP: 138/75 mmHg (05/09 0757) Pulse Rate: 70 (05/09 0757)  Labs:  Recent Labs  10/15/13 1332 10/15/13 1933  10/17/13 0338 10/17/13 0920 10/17/13 1437 10/18/13 0420  HGB  --  14.2  < > 12.1 12.3  --  13.1  HCT  --  42.2  < > 37.8 37.4  --  40.6  PLT  --  309  < > 236 254  --  278  LABPROT  --  14.2  --   --   --   --   --   INR  --  1.12  --   --   --   --   --   HEPARINUNFRC  --   --   --   --  0.45  --  0.54  CREATININE  --  0.88  --   --   --  0.85 0.91  TROPONINI <0.30 <0.30  --   --   --   --   --   < > = values in this interval not displayed. Estimated Creatinine Clearance: 86.2 ml/min (by C-G formula based on Cr of 0.91).  Medical History: Past Medical History  Diagnosis Date  . Asthma   . Hypertension   . Sleep apnea   . Nocturia   . Depression   . Morbid obesity   . OSA (obstructive sleep apnea)     w/ hypoventilation  . Hypercholesteremia   . Obstructive sleep apnea (adult) (pediatric) 12/27/2012    AHI 17.7 baseline, titrated to 6 cm , AHi now 2.4 - compliance    Medications:  Scheduled:  . acyclovir  200 mg Oral q morning - 10a  . amLODipine  5 mg Oral Daily   And  . benazepril  20 mg Oral Daily  . aspirin EC  81 mg Oral Daily  . atorvastatin  80 mg Oral q1800  . buPROPion  100 mg Oral BID  . isosorbide mononitrate  30 mg Oral Daily  . metoprolol tartrate  25 mg Oral BID  . mometasone-formoterol  2 puff  Inhalation BID  . montelukast  10 mg Oral QHS  . pantoprazole  40 mg Oral q morning - 10a  . PARoxetine  20 mg Oral Daily  . sodium chloride  3 mL Intravenous Q12H   Assessment: 66 yoF presenting with chest pain/ shortness of breath. Pt [facility]. She was found to have severe dz and plans for CABG on Monday noted.  Her heparin level is 0.54 IU/ml which is within the desired goal range without noted bleeding complications.    Goal of Therapy:  Heparin level = 0.3-0.7 Monitor platelets by anticoagulation protocol: Yes   Plan:  Continue IV heparin at 1300 units/hr F/u daily level and CBC Monitor for bleeding complications  Alexas Basulto M. Posey Pronto, PharmD Clinical Pharmacist- Resident Pager: [phone] Pharmacy: [phone] 10/18/2013 10:47 AM

## 2013-10-18 NOTE — Progress Notes (Signed)
RT spoke with patient about wearing CPAP and patient stated that she didn't want to wear the CPAP and if she changed her mind she would call for RT.  RT will continue to monitor.

## 2013-10-18 NOTE — Progress Notes (Signed)
[place] TEAM 1 - Stepdown/ICU TEAM Progress Note  Amber Wong [id number]:[id number] DOB: 11/26/1955 DOA: 10/15/2013 PCP: Imelda Pillow, NP  Admit HPI / Brief Narrative: Amber Wong is a 58 y.o. BF PMHx  HTN, HLD, OSA on CPAP, HLD, COPD, anxiety, family history of MIs her father at age 50 and mother at age 50, presents to the ED with a chief complaint of chest pain. Patient is an extremely poor historian and she describes to me in the ED multiple types of chest pain over various time periods. She is very non specific and vague about her symptoms; she describes to me new onset chest pain last week like "an elephant sitting on my chest", irradiating of her left arm, resolved with sitting up. She also describes chest discomfort (but different, more dull) with exertion, associated with SOB. She also has left sided chest pain every time she "eats the wrong stuff". Her pain in the ED is dull, retrosternal irradiating to her left arm, relieved to s.l. Nitroglycerin x 2. She can't tell me if she experienced pain like this in the past. It seem like her chest pressure and left arm pain/numnbess is new since last Friday. In the ED, EKG without specific ischemic changes, troponin is negative. She denies any fever or chills, denies    HPI/Subjective: 5/9 patient sitting comfortably in bed he lunch without complaint   Assessment/Plan: Diastolic CHF -BP within AHA guideline -Continue amlodipine 5 mg daily -Continue enalapril 20 mg daily -Continue metoprolol 25 mg BID -Continue Imdur 30 mg daily  Unstable angina -See diastolic CHF -Patient schedule for CABG on Monday -Obtain A1c  Multivessel coronary artery disease -See unstable angina  OSA -CPAP per respiratory  HTN -Within AHA guideline -See diastolic CHF  HLD -Continue Lipitor 80 mg daily -Obtain lipid panel  COPD -Continue albuterol nebulizer q 6hr PRN - Anxiety -Continue Xanax 0.25-0.5 mg q 4hr  PRN  Depression -Continue Paxil 20 mg daily -Continue Wellbutrin 100 mg BID  Herpes simplex without complication -Continue acyclovir    Code Status: FULL Family Communication: no family present at time of exam Disposition Plan: Per cardiology/cardiothoracic surgery    Consultants: Dr. Candee Furbish (cardiology) Dr. Tharon Aquas Trigt (cardiothoracic surgery)   Procedure/Significant Events: 10/16/2013 cardiac catheterization -Hyperdynamic LV function  -Severe 80% distal left main stenosis with 50% diffuse proximal LAD stenoses  Echocardiogram 10/17/2013 - LVEF= 55% - 60%.  -(grade 1 diastolic dysfunction). - Pulmonary arteries: PA peak pressure: 24mm Hg (S).      Culture N./A.  Antibiotics: N./A.  DVT prophylaxis: Heparin infusion   Devices N./A.   LINES / TUBES:  5/6 22 GA left forearm 5/7 20 GA right antecubital    Continuous Infusions: . sodium chloride 80 mL/hr at 10/17/13 0600  . heparin 1,300 Units/hr (10/17/13 0600)    Objective: VITAL SIGNS: Temp: 97.5 F (36.4 C) (05/09 1159) Temp src: Oral (05/09 1159) BP: 109/70 mmHg (05/09 1159) Pulse Rate: 70 (05/09 0757) SPO2; FIO2:   Intake/Output Summary (Last 24 hours) at 10/18/13 1321 Last data filed at 10/18/13 1200  Gross per 24 hour  Intake    926 ml  Output      0 ml  Net    926 ml     Exam: General: A./O. x4, NAD, No acute respiratory distress Lungs: Clear to auscultation bilaterally without wheezes or crackles Cardiovascular: Regular rate and rhythm without murmur gallop or rub normal S1 and S2 Abdomen: Nontender, nondistended, soft, bowel sounds positive, no rebound,  no ascites, no appreciable mass Extremities: No significant cyanosis, clubbing, or edema bilateral lower extremities  Data Reviewed: Basic Metabolic Panel:  Recent Labs Lab 10/15/13 0615 10/15/13 1933 10/16/13 0335 10/16/13 1015 10/16/13 1358 10/17/13 1437 10/18/13 0420  NA 141 141  --   --   --  140 140  K  3.1* 3.1* 2.9* 3.9 3.4* 3.8 3.7  CL 100 101  --   --   --  105 104  CO2 25 25  --   --   --  23 22  GLUCOSE 99 98  --   --   --  102* 93  BUN 9 9  --   --   --  8 9  CREATININE 0.92 0.88  --   --   --  0.85 0.91  CALCIUM 9.0 9.2  --   --   --  8.8 9.0  MG  --   --   --   --   --  1.9 1.9   Liver Function Tests: No results found for this basename: AST, ALT, ALKPHOS, BILITOT, PROT, ALBUMIN,  in the last 168 hours No results found for this basename: LIPASE, AMYLASE,  in the last 168 hours No results found for this basename: AMMONIA,  in the last 168 hours CBC:  Recent Labs Lab 10/15/13 0615 10/15/13 1933 10/16/13 0335 10/17/13 0338 10/17/13 0920 10/18/13 0420  WBC 6.6 8.1 6.7 5.8 5.7 7.2  NEUTROABS 3.2  --   --   --   --   --   HGB 13.7 14.2 12.6 12.1 12.3 13.1  HCT 41.0 42.2 37.9 37.8 37.4 40.6  MCV 86.1 87.4 87.3 89.6 88.4 89.0  PLT 279 309 267 236 254 278   Cardiac Enzymes:  Recent Labs Lab 10/15/13 0615 10/15/13 1332 10/15/13 1933  TROPONINI <0.30 <0.30 <0.30   BNP (last 3 results)  Recent Labs  10/15/13 0802  PROBNP 41.4   CBG: No results found for this basename: GLUCAP,  in the last 168 hours  Recent Results (from the past 240 hour(s))  MRSA PCR SCREENING     Status: None   Collection Time    10/16/13  8:08 PM      Result Value Ref Range Status   MRSA by PCR NEGATIVE  NEGATIVE Final   Comment:            The GeneXpert MRSA Assay (FDA     approved for NASAL specimens     only), is one component of a     comprehensive MRSA colonization     surveillance program. It is not     intended to diagnose MRSA     infection nor to guide or     monitor treatment for     MRSA infections.  SURGICAL PCR SCREEN     Status: None   Collection Time    10/17/13  9:43 AM      Result Value Ref Range Status   MRSA, PCR NEGATIVE  NEGATIVE Final   Staphylococcus aureus NEGATIVE  NEGATIVE Final   Comment:            The Xpert SA Assay (FDA     approved for NASAL  specimens     in patients over 26 years of age),     is one component of     a comprehensive surveillance     program.  Test performance has     been validated by [facility] for patients greater     than or equal to 36 year old.     It is not intended     to diagnose infection nor to     guide or monitor treatment.     Studies:  Recent x-ray studies have been reviewed in detail by the Attending Physician  Scheduled Meds:  Scheduled Meds: . acyclovir  200 mg Oral q morning - 10a  . amLODipine  5 mg Oral Daily   And  . benazepril  20 mg Oral Daily  . aspirin EC  81 mg Oral Daily  . atorvastatin  80 mg Oral q1800  . buPROPion  100 mg Oral BID  . isosorbide mononitrate  30 mg Oral Daily  . metoprolol tartrate  25 mg Oral BID  . mometasone-formoterol  2 puff Inhalation BID  . montelukast  10 mg Oral QHS  . pantoprazole  40 mg Oral q morning - 10a  . PARoxetine  20 mg Oral Daily  . sodium chloride  3 mL Intravenous Q12H    Time spent on care of this patient: 40 mins   Allie Bossier , MD   [facility] Office  [phone] Pager [phone]  On-Call/Text Page:      [email or web address]      password [email or web address]  If 7PM-7AM, please contact night-coverage [email or web address] Password [email or web address] 10/18/2013, 1:21 PM   LOS: 3 days

## 2013-10-19 DIAGNOSIS — E669 Obesity, unspecified: Secondary | ICD-10-CM

## 2013-10-19 LAB — CBC
HCT: 41.9 % (ref 36.0–46.0)
HEMATOCRIT: 39.2 % (ref 36.0–46.0)
Hemoglobin: 12.5 g/dL (ref 12.0–15.0)
Hemoglobin: 13.5 g/dL (ref 12.0–15.0)
MCH: 28.4 pg (ref 26.0–34.0)
MCH: 29.2 pg (ref 26.0–34.0)
MCHC: 31.9 g/dL (ref 30.0–36.0)
MCHC: 32.2 g/dL (ref 30.0–36.0)
MCV: 89.1 fL (ref 78.0–100.0)
MCV: 90.5 fL (ref 78.0–100.0)
Platelets: 277 10*3/uL (ref 150–400)
Platelets: 284 10*3/uL (ref 150–400)
RBC: 4.4 MIL/uL (ref 3.87–5.11)
RBC: 4.63 MIL/uL (ref 3.87–5.11)
RDW: 15.6 % — ABNORMAL HIGH (ref 11.5–15.5)
RDW: 15.6 % — ABNORMAL HIGH (ref 11.5–15.5)
WBC: 7.6 10*3/uL (ref 4.0–10.5)
WBC: 8.2 10*3/uL (ref 4.0–10.5)

## 2013-10-19 LAB — BASIC METABOLIC PANEL
BUN: 9 mg/dL (ref 6–23)
BUN: 13 mg/dL (ref 6–23)
CALCIUM: 9.2 mg/dL (ref 8.4–10.5)
CO2: 27 mEq/L (ref 19–32)
CO2: 29 mEq/L (ref 19–32)
Calcium: 9.9 mg/dL (ref 8.4–10.5)
Chloride: 99 mEq/L (ref 96–112)
Chloride: 99 mEq/L (ref 96–112)
Creatinine, Ser: 0.83 mg/dL (ref 0.50–1.10)
Creatinine, Ser: 0.92 mg/dL (ref 0.50–1.10)
GFR calc Af Amer: 79 mL/min — ABNORMAL LOW (ref >90)
GFR calc non Af Amer: 77 mL/min — ABNORMAL LOW (ref >90)
GFR calc non Af Amer: 68 mL/min — ABNORMAL LOW (ref >90)
GFR, EST AFRICAN AMERICAN: 89 mL/min — AB (ref >90)
GLUCOSE: 83 mg/dL (ref 70–99)
Glucose, Bld: 78 mg/dL (ref 70–99)
Potassium: 3.6 mEq/L — ABNORMAL LOW (ref 3.7–5.3)
Potassium: 3.9 mEq/L (ref 3.7–5.3)
Sodium: 139 mEq/L (ref 137–147)
Sodium: 138 mEq/L (ref 137–147)

## 2013-10-19 LAB — HEPARIN LEVEL (UNFRACTIONATED): Heparin Unfractionated: 0.6 IU/mL (ref 0.30–0.70)

## 2013-10-19 LAB — MAGNESIUM: Magnesium: 1.9 mg/dL (ref 1.5–2.5)

## 2013-10-19 MED ORDER — SODIUM CHLORIDE 0.9 % IV SOLN
INTRAVENOUS | Status: AC
  Administered 2013-10-20: .9 [IU]/h via INTRAVENOUS
  Filled 2013-10-19: qty 1

## 2013-10-19 MED ORDER — DEXMEDETOMIDINE HCL IN NACL 400 MCG/100ML IV SOLN
0.1000 ug/kg/h | INTRAVENOUS | Status: AC
Start: 2013-10-20 — End: 2013-10-20
  Administered 2013-10-20: 0.2 ug/kg/h via INTRAVENOUS
  Filled 2013-10-19: qty 100

## 2013-10-19 MED ORDER — BISACODYL 5 MG PO TBEC
5.0000 mg | DELAYED_RELEASE_TABLET | Freq: Once | ORAL | Status: DC
  Filled 2013-10-19: qty 1

## 2013-10-19 MED ORDER — PHENYLEPHRINE HCL 10 MG/ML IJ SOLN
30.0000 ug/min | INTRAVENOUS | Status: DC
  Filled 2013-10-19: qty 2

## 2013-10-19 MED ORDER — TEMAZEPAM 15 MG PO CAPS: 15.0000 mg | ORAL_CAPSULE | Freq: Once | ORAL | Status: AC | PRN

## 2013-10-19 MED ORDER — DEXTROSE 5 % IV SOLN
750.0000 mg | INTRAVENOUS | Status: DC
  Filled 2013-10-19: qty 750

## 2013-10-19 MED ORDER — DIAZEPAM 5 MG PO TABS
5.0000 mg | ORAL_TABLET | Freq: Once | ORAL | Status: AC
  Administered 2013-10-20: 5 mg via ORAL
  Filled 2013-10-19: qty 1

## 2013-10-19 MED ORDER — EPINEPHRINE HCL 1 MG/ML IJ SOLN
0.5000 ug/min | INTRAVENOUS | Status: DC
  Filled 2013-10-19: qty 4

## 2013-10-19 MED ORDER — VANCOMYCIN HCL 10 G IV SOLR
1500.0000 mg | INTRAVENOUS | Status: AC
  Administered 2013-10-20: 1500 mg via INTRAVENOUS
  Filled 2013-10-19: qty 1500

## 2013-10-19 MED ORDER — DOPAMINE-DEXTROSE 3.2-5 MG/ML-% IV SOLN
2.0000 ug/kg/min | INTRAVENOUS | Status: DC
  Filled 2013-10-19: qty 250

## 2013-10-19 MED ORDER — CHLORHEXIDINE GLUCONATE 4 % EX LIQD
60.0000 mL | Freq: Once | CUTANEOUS | Status: AC
  Administered 2013-10-20: 4 via TOPICAL

## 2013-10-19 MED ORDER — PLASMA-LYTE 148 IV SOLN
INTRAVENOUS | Status: AC
  Administered 2013-10-20: 09:00:00
  Filled 2013-10-19: qty 2.5

## 2013-10-19 MED ORDER — SODIUM CHLORIDE 0.9 % IV SOLN
INTRAVENOUS | Status: DC
  Filled 2013-10-19: qty 30

## 2013-10-19 MED ORDER — CHLORHEXIDINE GLUCONATE 4 % EX LIQD
60.0000 mL | Freq: Once | CUTANEOUS | Status: AC
  Administered 2013-10-19: 4 via TOPICAL
  Filled 2013-10-19: qty 60

## 2013-10-19 MED ORDER — DEXTROSE 5 % IV SOLN
1.5000 g | INTRAVENOUS | Status: AC
  Administered 2013-10-20: .75 g via INTRAVENOUS
  Administered 2013-10-20: 1.5 g via INTRAVENOUS
  Filled 2013-10-19: qty 1.5

## 2013-10-19 MED ORDER — MAGNESIUM SULFATE 50 % IJ SOLN
40.0000 meq | INTRAMUSCULAR | Status: DC
  Filled 2013-10-19: qty 10

## 2013-10-19 MED ORDER — SODIUM CHLORIDE 0.9 % IV SOLN
INTRAVENOUS | Status: AC
  Administered 2013-10-20: 70 mL via INTRAVENOUS
  Filled 2013-10-19: qty 40

## 2013-10-19 MED ORDER — METOPROLOL TARTRATE 12.5 MG HALF TABLET
12.5000 mg | ORAL_TABLET | Freq: Once | ORAL | Status: AC
  Administered 2013-10-20: 12.5 mg via ORAL
  Filled 2013-10-19: qty 1

## 2013-10-19 MED ORDER — POTASSIUM CHLORIDE 2 MEQ/ML IV SOLN
80.0000 meq | INTRAVENOUS | Status: DC
  Filled 2013-10-19: qty 40

## 2013-10-19 MED ORDER — NITROGLYCERIN IN D5W 200-5 MCG/ML-% IV SOLN
2.0000 ug/min | INTRAVENOUS | Status: AC
  Administered 2013-10-20: 5 ug/min via INTRAVENOUS
  Filled 2013-10-19: qty 250

## 2013-10-19 NOTE — Progress Notes (Signed)
Amber Wong TEAM 1 - Stepdown/ICU TEAM Progress Note  Amber Wong [id number] DOB: 09/06/1955 DOA: 10/15/2013 PCP: Imelda Pillow, NP  Admit HPI / Brief Narrative: Amber Wong is a 58 y.o. BF PMHx  HTN, HLD, OSA on CPAP, HLD, COPD, anxiety, family history of MIs her father at age 55 and mother at age 67, presents to the ED with a chief complaint of chest pain. Patient is an extremely poor historian and she describes to me in the ED multiple types of chest pain over various time periods. She is very non specific and vague about her symptoms; she describes to me new onset chest pain last week like "an elephant sitting on my chest", irradiating of her left arm, resolved with sitting up. She also describes chest discomfort (but different, more dull) with exertion, associated with SOB. She also has left sided chest pain every time she "eats the wrong stuff". Her pain in the ED is dull, retrosternal irradiating to her left arm, relieved to s.l. Nitroglycerin x 2. She can't tell me if she experienced pain like this in the past. It seem like her chest pressure and left arm pain/numnbess is new since last Friday. In the ED, EKG without specific ischemic changes, troponin is negative. She denies any fever or chills, denies    HPI/Subjective: 5/10 patient sitting comfortably in chair eating lunch without complaint   Assessment/Plan: Diastolic CHF -BP within AHA guideline -Continue amlodipine 5 mg daily -Continue enalapril 20 mg daily -Continue metoprolol 25 mg BID -Continue Imdur 30 mg daily  Unstable angina -See diastolic CHF -Patient schedule for CABG on Monday -Obtain A1c  Multivessel coronary artery disease -See unstable angina -Schedule after CABG on 5/11  OSA -CPAP per respiratory  HTN -Within AHA guideline -See diastolic CHF  HLD -Continue Lipitor 80 mg daily -Obtain lipid panel  COPD -Continue albuterol nebulizer q 6hr PRN - Anxiety -Continue Xanax  0.25-0.5 mg q 4hr PRN  Depression -Continue Paxil 20 mg daily -Continue Wellbutrin 100 mg BID  Herpes simplex without complication -Continue acyclovir    Code Status: FULL Family Communication: no family present at time of exam Disposition Plan: Per cardiology/cardiothoracic surgery    Consultants: Dr. Candee Furbish (cardiology) Dr. Tharon Aquas Trigt (cardiothoracic surgery)   Procedure/Significant Events: 10/16/2013 cardiac catheterization -Hyperdynamic LV function  -Severe 80% distal left main stenosis with 50% diffuse proximal LAD stenoses  Echocardiogram 10/17/2013 - LVEF= 55% - 60%.  -(grade 1 diastolic dysfunction). - Pulmonary arteries: PA peak pressure: 48mm Hg (S).      Culture N./A.  Antibiotics: N./A.  DVT prophylaxis: Heparin infusion   Devices N./A.   LINES / TUBES:  5/6 22 GA left forearm 5/7 20 GA right antecubital    Continuous Infusions: . sodium chloride 80 mL/hr at 10/17/13 0600  . heparin 1,300 Units/hr (10/19/13 0600)    Objective: VITAL SIGNS: Temp: 97.7 F (36.5 C) (05/10 1201) Temp src: Oral (05/10 1201) BP: 108/74 mmHg (05/10 1201) Pulse Rate: 59 (05/10 0335) SPO2; 98% on room FIO2:   Intake/Output Summary (Last 24 hours) at 10/19/13 1440 Last data filed at 10/18/13 1800  Gross per 24 hour  Intake    166 ml  Output      0 ml  Net    166 ml     Exam: General: A./O. x4, NAD, No acute respiratory distress Lungs: Clear to auscultation bilaterally without wheezes or crackles Cardiovascular: Regular rate and rhythm without murmur gallop or rub normal S1 and S2 Abdomen:  Nontender, nondistended, soft, bowel sounds positive, no rebound, no ascites, no appreciable mass Extremities: No significant cyanosis, clubbing, or edema bilateral lower extremities  Data Reviewed: Basic Metabolic Panel:  Recent Labs Lab 10/15/13 0615 10/15/13 1933  10/16/13 1015 10/16/13 1358 10/17/13 1437 10/18/13 0420 10/19/13 0240  NA 141  141  --   --   --  140 140 139  K 3.1* 3.1*  < > 3.9 3.4* 3.8 3.7 3.6*  CL 100 101  --   --   --  105 104 99  CO2 25 25  --   --   --  23 22 27   GLUCOSE 99 98  --   --   --  102* 93 83  BUN 9 9  --   --   --  8 9 9   CREATININE 0.92 0.88  --   --   --  0.85 0.91 0.83  CALCIUM 9.0 9.2  --   --   --  8.8 9.0 9.2  MG  --   --   --   --   --  1.9 1.9 1.9  < > = values in this interval not displayed. Liver Function Tests: No results found for this basename: AST, ALT, ALKPHOS, BILITOT, PROT, ALBUMIN,  in the last 168 hours No results found for this basename: LIPASE, AMYLASE,  in the last 168 hours No results found for this basename: AMMONIA,  in the last 168 hours CBC:  Recent Labs Lab 10/15/13 0615  10/16/13 0335 10/17/13 0338 10/17/13 0920 10/18/13 0420 10/19/13 0240  WBC 6.6  < > 6.7 5.8 5.7 7.2 7.6  NEUTROABS 3.2  --   --   --   --   --   --   HGB 13.7  < > 12.6 12.1 12.3 13.1 12.5  HCT 41.0  < > 37.9 37.8 37.4 40.6 39.2  MCV 86.1  < > 87.3 89.6 88.4 89.0 89.1  PLT 279  < > 267 236 254 278 277  < > = values in this interval not displayed. Cardiac Enzymes:  Recent Labs Lab 10/15/13 0615 10/15/13 1332 10/15/13 1933  TROPONINI <0.30 <0.30 <0.30   BNP (last 3 results)  Recent Labs  10/15/13 0802  PROBNP 41.4   CBG: No results found for this basename: GLUCAP,  in the last 168 hours  Recent Results (from the past 240 hour(s))  MRSA PCR SCREENING     Status: None   Collection Time    10/16/13  8:08 PM      Result Value Ref Range Status   MRSA by PCR NEGATIVE  NEGATIVE Final   Comment:            The GeneXpert MRSA Assay (FDA     approved for NASAL specimens     only), is one component of a     comprehensive MRSA colonization     surveillance program. It is not     intended to diagnose MRSA     infection nor to guide or     monitor treatment for     MRSA infections.  SURGICAL PCR SCREEN     Status: None   Collection Time    10/17/13  9:43 AM      Result Value  Ref Range Status   MRSA, PCR NEGATIVE  NEGATIVE Final   Staphylococcus aureus NEGATIVE  NEGATIVE Final   Comment:            The Xpert SA Assay (  FDA     approved for NASAL specimens     in patients over 53 years of age),     is one component of     a comprehensive surveillance     program.  Test performance has     been validated by [employer] for patients greater     than or equal to 47 year old.     It is not intended     to diagnose infection nor to     guide or monitor treatment.     Studies:  Recent x-ray studies have been reviewed in detail by the Attending Physician  Scheduled Meds:  Scheduled Meds: . acyclovir  200 mg Oral q morning - 10a  . [START ON 10/20/2013] aminocaproic acid (AMICAR) for OHS   Intravenous To OR  . amLODipine  5 mg Oral Daily   And  . benazepril  20 mg Oral Daily  . aspirin EC  81 mg Oral Daily  . atorvastatin  80 mg Oral q1800  . buPROPion  100 mg Oral BID  . [START ON 10/20/2013] cefUROXime (ZINACEF)  IV  750 mg Intravenous To OR  . chlorhexidine  60 mL Topical Once   And  . [START ON 10/20/2013] chlorhexidine  60 mL Topical Once  . [START ON 10/20/2013] dexmedetomidine  0.1-0.7 mcg/kg/hr Intravenous To OR  . [START ON 10/20/2013] DOPamine  2-20 mcg/kg/min Intravenous To OR  . [START ON 10/20/2013] epinephrine  0.5-20 mcg/min Intravenous To OR  . [START ON 10/20/2013] heparin-papaverine-plasmalyte irrigation   Irrigation To OR  . [START ON 10/20/2013] heparin 30,000 units/NS 1000 mL solution for CELLSAVER   Other To OR  . [START ON 10/20/2013] insulin (NOVOLIN-R) infusion   Intravenous To OR  . isosorbide mononitrate  30 mg Oral Daily  . [START ON 10/20/2013] magnesium sulfate  40 mEq Other To OR  . metoprolol tartrate  25 mg Oral BID  . mometasone-formoterol  2 puff Inhalation BID  . montelukast  10 mg Oral QHS  . [START ON 10/20/2013] nitroGLYCERIN  2-200 mcg/min Intravenous To OR  . pantoprazole  40 mg Oral q morning - 10a  . PARoxetine   20 mg Oral Daily  . [START ON 10/20/2013] phenylephrine (NEO-SYNEPHRINE) Adult infusion  30-200 mcg/min Intravenous To OR  . [START ON 10/20/2013] potassium chloride  80 mEq Other To OR  . sodium chloride  3 mL Intravenous Q12H    Time spent on care of this patient: 40 mins   Allie Bossier , MD   [facility] Office  [phone] Pager - [phone]  On-Call/Text Page:      [email or web address]      password [email or web address]  If 7PM-7AM, please contact night-coverage [email or web address] Password [email or web address] 10/19/2013, 2:40 PM   LOS: 4 days

## 2013-10-19 NOTE — Progress Notes (Signed)
    SUBJECTIVE:    Presently she is pain free.  Denies SOB.  She awaits surgery tomorrow.   PHYSICAL EXAM Filed Vitals:   10/18/13 1943 10/18/13 2315 10/19/13 0335 10/19/13 0855  BP: 100/63 109/67 127/92 150/94  Pulse:  59 59   Temp: 97.8 F (36.6 C) 97.7 F (36.5 C) 97.9 F (36.6 C) 98.6 F (37 C)  TempSrc: Oral Oral Oral Oral  Resp:      Height:      Weight:      SpO2:  99% 100% 99%   General:  A&Ox3 No distress OP clear Neck supple Lungs:  Clear Heart:  RRR Abdomen:  Positive bowel sounds, no rebound no guarding Extremities:  No edema, right wrist OK Neuro- strength/sensation are intact  LABS: Lab Results  Component Value Date   TROPONINI <0.30 10/15/2013   Results for orders placed during the hospital encounter of 10/15/13 (from the past 24 hour(s))  CBC     Status: Abnormal   Collection Time    10/19/13  2:40 AM      Result Value Ref Range   WBC 7.6  4.0 - 10.5 K/uL   RBC 4.40  3.87 - 5.11 MIL/uL   Hemoglobin 12.5  12.0 - 15.0 g/dL   HCT 39.2  36.0 - 46.0 %   MCV 89.1  78.0 - 100.0 fL   MCH 28.4  26.0 - 34.0 pg   MCHC 31.9  30.0 - 36.0 g/dL   RDW 15.6 (*) 11.5 - 15.5 %   Platelets 277  150 - 400 K/uL  HEPARIN LEVEL (UNFRACTIONATED)     Status: None   Collection Time    10/19/13  2:40 AM      Result Value Ref Range   Heparin Unfractionated 0.60  0.30 - 0.70 IU/mL  BASIC METABOLIC PANEL     Status: Abnormal   Collection Time    10/19/13  2:40 AM      Result Value Ref Range   Sodium 139  137 - 147 mEq/L   Potassium 3.6 (*) 3.7 - 5.3 mEq/L   Chloride 99  96 - 112 mEq/L   CO2 27  19 - 32 mEq/L   Glucose, Bld 83  70 - 99 mg/dL   BUN 9  6 - 23 mg/dL   Creatinine, Ser 0.83  0.50 - 1.10 mg/dL   Calcium 9.2  8.4 - 10.5 mg/dL   GFR calc non Af Amer 77 (*) >90 mL/min   GFR calc Af Amer 89 (*) >90 mL/min  MAGNESIUM     Status: None   Collection Time    10/19/13  2:40 AM      Result Value Ref Range   Magnesium 1.9  1.5 - 2.5 mg/dL    Intake/Output  Summary (Last 24 hours) at 10/19/13 1113 Last data filed at 10/18/13 1800  Gross per 24 hour  Intake    355 ml  Output      0 ml  Net    355 ml    ASSESSMENT AND PLAN:  CAD:  LM stenosis.  CABG on Monday.  Continue IV heparin.  Will keep in TCU.  HTN:  BP OK.  Continue current meds.   HYPERLIPIDEMIA:  LDL 130.    Now on Lipitor.    OBSTRUCTIVE SLEEP APNEA   Thompson Grayer 10/19/2013 11:13 AM

## 2013-10-19 NOTE — Progress Notes (Signed)
ANTICOAGULATION CONSULT NOTE - Follow Up  Pharmacy Consult for heparin Indication: chest pain/ACS  Allergies  Allergen Reactions  . Iohexol Other (See Comments)     Code: HIVES, Desc: premedicated 2 hrs prior with 200mg  soulumedrol IV, no reaction s/p injection, Onset Date: [id number]   . Penicillins Other (See Comments)    REACTION: pruritis   Patient Measurements: Height: 5\' 8"  (172.7 cm) Weight: 230 lb (104.327 kg) IBW/kg (Calculated) : 63.9 Heparin wt =  Vital Signs: Temp: 98.6 F (37 C) (05/10 0855) Temp src: Oral (05/10 0855) BP: 150/94 mmHg (05/10 0855) Pulse Rate: 59 (05/10 0335)  Labs:  Recent Labs  10/17/13 0920 10/17/13 1437 10/18/13 0420 10/19/13 0240  HGB 12.3  --  13.1 12.5  HCT 37.4  --  40.6 39.2  PLT 254  --  278 277  HEPARINUNFRC 0.45  --  0.54 0.60  CREATININE  --  0.85 0.91 0.83   Estimated Creatinine Clearance: 94.6 ml/min (by C-G formula based on Cr of 0.83).  Medical History: Past Medical History  Diagnosis Date  . Asthma   . Hypertension   . Sleep apnea   . Nocturia   . Depression   . Morbid obesity   . OSA (obstructive sleep apnea)     w/ hypoventilation  . Hypercholesteremia   . Obstructive sleep apnea (adult) (pediatric) 12/27/2012    AHI 17.7 baseline, titrated to 6 cm , AHi now 2.4 - compliance    Medications:  Scheduled:  . acyclovir  200 mg Oral q morning - 10a  . amLODipine  5 mg Oral Daily   And  . benazepril  20 mg Oral Daily  . aspirin EC  81 mg Oral Daily  . atorvastatin  80 mg Oral q1800  . buPROPion  100 mg Oral BID  . isosorbide mononitrate  30 mg Oral Daily  . metoprolol tartrate  25 mg Oral BID  . mometasone-formoterol  2 puff Inhalation BID  . montelukast  10 mg Oral QHS  . pantoprazole  40 mg Oral q morning - 10a  . PARoxetine  20 mg Oral Daily  . sodium chloride  3 mL Intravenous Q12H   Assessment: 58 yoF presenting with chest pain/ shortness of breath. Pt was [facility]. She was  found to have severe dz and plans for CABG on Monday noted.  Her heparin level is 0.6 IU/ml which is within the desired goal range without noted bleeding complications.    Goal of Therapy:  Heparin level = 0.3-0.7 Monitor platelets by anticoagulation protocol: Yes   Plan:  Continue IV heparin at 1300 units/hr F/u daily level and CBC Monitor for bleeding complications  Adal Sereno M. Posey Pronto, PharmD Clinical Pharmacist- Resident Pager: [phone] Pharmacy: [phone] 10/19/2013 8:58 AM

## 2013-10-20 ENCOUNTER — Inpatient Hospital Stay (HOSPITAL_COMMUNITY): Payer: Medicaid Other

## 2013-10-20 ENCOUNTER — Encounter (HOSPITAL_COMMUNITY): Payer: Self-pay | Admitting: Anesthesiology

## 2013-10-20 ENCOUNTER — Inpatient Hospital Stay (HOSPITAL_COMMUNITY): Payer: Medicaid Other | Admitting: Anesthesiology

## 2013-10-20 ENCOUNTER — Encounter (HOSPITAL_COMMUNITY)
Admission: EM | Disposition: A | Discharge status: Home / Self Care | Payer: Medicaid Other | Source: Home / Self Care | Attending: Cardiothoracic Surgery

## 2013-10-20 ENCOUNTER — Encounter (HOSPITAL_COMMUNITY): Payer: Medicaid Other | Admitting: Anesthesiology

## 2013-10-20 DIAGNOSIS — I2510 Atherosclerotic heart disease of native coronary artery without angina pectoris: Secondary | ICD-10-CM | POA: Diagnosis present

## 2013-10-20 HISTORY — PX: INTRAOPERATIVE TRANSESOPHAGEAL ECHOCARDIOGRAM: SHX5062

## 2013-10-20 HISTORY — PX: CORONARY ARTERY BYPASS GRAFT: SHX141

## 2013-10-20 LAB — POCT I-STAT, CHEM 8
BUN: 7 mg/dL (ref 6–23)
CREATININE: 0.7 mg/dL (ref 0.50–1.10)
Calcium, Ion: 1.09 mmol/L — ABNORMAL LOW (ref 1.12–1.23)
Chloride: 107 mEq/L (ref 96–112)
Glucose, Bld: 131 mg/dL — ABNORMAL HIGH (ref 70–99)
HCT: 33 % — ABNORMAL LOW (ref 36.0–46.0)
Hemoglobin: 11.2 g/dL — ABNORMAL LOW (ref 12.0–15.0)
POTASSIUM: 4.1 meq/L (ref 3.7–5.3)
SODIUM: 143 meq/L (ref 137–147)
TCO2: 23 mmol/L (ref 0–100)

## 2013-10-20 LAB — HEPARIN LEVEL (UNFRACTIONATED): Heparin Unfractionated: 0.61 IU/mL (ref 0.30–0.70)

## 2013-10-20 LAB — PROTIME-INR
INR: 1.52 — ABNORMAL HIGH (ref 0.00–1.49)
Prothrombin Time: 17.9 seconds — ABNORMAL HIGH (ref 11.6–15.2)

## 2013-10-20 LAB — BASIC METABOLIC PANEL
BUN: 13 mg/dL (ref 6–23)
CALCIUM: 9.6 mg/dL (ref 8.4–10.5)
CO2: 28 mEq/L (ref 19–32)
CREATININE: 0.99 mg/dL (ref 0.50–1.10)
Chloride: 101 mEq/L (ref 96–112)
GFR calc Af Amer: 72 mL/min — ABNORMAL LOW (ref >90)
GFR calc non Af Amer: 62 mL/min — ABNORMAL LOW (ref >90)
Glucose, Bld: 87 mg/dL (ref 70–99)
Potassium: 3.8 mEq/L (ref 3.7–5.3)
Sodium: 142 mEq/L (ref 137–147)

## 2013-10-20 LAB — POCT I-STAT 3, ART BLOOD GAS (G3+)
ACID-BASE DEFICIT: 1 mmol/L (ref 0.0–2.0)
ACID-BASE EXCESS: 3 mmol/L — AB (ref 0.0–2.0)
ACID-BASE EXCESS: 5 mmol/L — AB (ref 0.0–2.0)
Acid-Base Excess: 1 mmol/L (ref 0.0–2.0)
Acid-base deficit: 2 mmol/L (ref 0.0–2.0)
BICARBONATE: 23.8 meq/L (ref 20.0–24.0)
BICARBONATE: 24.8 meq/L — AB (ref 20.0–24.0)
BICARBONATE: 28.2 meq/L — AB (ref 20.0–24.0)
Bicarbonate: 27.3 mEq/L — ABNORMAL HIGH (ref 20.0–24.0)
Bicarbonate: 24.5 mEq/L — ABNORMAL HIGH (ref 20.0–24.0)
Bicarbonate: 23.5 mEq/L (ref 20.0–24.0)
O2 SAT: 100 %
O2 SAT: 94 %
O2 Saturation: 92 %
O2 Saturation: 92 %
O2 Saturation: 95 %
O2 Saturation: 99 %
PCO2 ART: 28.8 mmHg — AB (ref 35.0–45.0)
PCO2 ART: 43.5 mmHg (ref 35.0–45.0)
PCO2 ART: 39.8 mmHg (ref 35.0–45.0)
PH ART: 7.434 (ref 7.350–7.450)
PH ART: 7.488 — AB (ref 7.350–7.450)
PO2 ART: 52 mmHg — AB (ref 80.0–100.0)
PO2 ART: 65 mmHg — AB (ref 80.0–100.0)
PO2 ART: 72 mmHg — AB (ref 80.0–100.0)
PO2 ART: 81 mmHg (ref 80.0–100.0)
Patient temperature: 35.8
Patient temperature: 98.6
TCO2: 25 mmol/L (ref 0–100)
TCO2: 25 mmol/L (ref 0–100)
TCO2: 26 mmol/L (ref 0–100)
TCO2: 26 mmol/L (ref 0–100)
TCO2: 28 mmol/L (ref 0–100)
TCO2: 29 mmol/L (ref 0–100)
pCO2 arterial: 37.1 mmHg (ref 35.0–45.0)
pCO2 arterial: 39.7 mmHg (ref 35.0–45.0)
pCO2 arterial: 40.7 mmHg (ref 35.0–45.0)
pH, Arterial: 7.358 (ref 7.350–7.450)
pH, Arterial: 7.378 (ref 7.350–7.450)
pH, Arterial: 7.404 (ref 7.350–7.450)
pH, Arterial: 7.521 — ABNORMAL HIGH (ref 7.350–7.450)
pO2, Arterial: 284 mmHg — ABNORMAL HIGH (ref 80.0–100.0)
pO2, Arterial: 134 mmHg — ABNORMAL HIGH (ref 80.0–100.0)

## 2013-10-20 LAB — CBC
HCT: 38.9 % (ref 36.0–46.0)
HCT: 32.1 % — ABNORMAL LOW (ref 36.0–46.0)
HEMATOCRIT: 30.6 % — AB (ref 36.0–46.0)
Hemoglobin: 12.4 g/dL (ref 12.0–15.0)
Hemoglobin: 10 g/dL — ABNORMAL LOW (ref 12.0–15.0)
Hemoglobin: 10.5 g/dL — ABNORMAL LOW (ref 12.0–15.0)
MCH: 28.7 pg (ref 26.0–34.0)
MCH: 28.3 pg (ref 26.0–34.0)
MCH: 28.7 pg (ref 26.0–34.0)
MCHC: 31.9 g/dL (ref 30.0–36.0)
MCHC: 32.7 g/dL (ref 30.0–36.0)
MCHC: 32.7 g/dL (ref 30.0–36.0)
MCV: 90 fL (ref 78.0–100.0)
MCV: 86.7 fL (ref 78.0–100.0)
MCV: 87.7 fL (ref 78.0–100.0)
Platelets: 282 10*3/uL (ref 150–400)
Platelets: 158 10*3/uL (ref 150–400)
Platelets: 154 10*3/uL (ref 150–400)
RBC: 4.32 MIL/uL (ref 3.87–5.11)
RBC: 3.53 MIL/uL — ABNORMAL LOW (ref 3.87–5.11)
RBC: 3.66 MIL/uL — ABNORMAL LOW (ref 3.87–5.11)
RDW: 15.5 % (ref 11.5–15.5)
RDW: 15.3 % (ref 11.5–15.5)
RDW: 15.4 % (ref 11.5–15.5)
WBC: 6.9 10*3/uL (ref 4.0–10.5)
WBC: 11 10*3/uL — ABNORMAL HIGH (ref 4.0–10.5)
WBC: 10.3 10*3/uL (ref 4.0–10.5)

## 2013-10-20 LAB — POCT I-STAT 4, (NA,K, GLUC, HGB,HCT)
GLUCOSE: 88 mg/dL (ref 70–99)
GLUCOSE: 108 mg/dL — AB (ref 70–99)
GLUCOSE: 118 mg/dL — AB (ref 70–99)
Glucose, Bld: 89 mg/dL (ref 70–99)
Glucose, Bld: 99 mg/dL (ref 70–99)
Glucose, Bld: 129 mg/dL — ABNORMAL HIGH (ref 70–99)
HCT: 38 % (ref 36.0–46.0)
HCT: 26 % — ABNORMAL LOW (ref 36.0–46.0)
HCT: 28 % — ABNORMAL LOW (ref 36.0–46.0)
HCT: 32 % — ABNORMAL LOW (ref 36.0–46.0)
HEMATOCRIT: 34 % — AB (ref 36.0–46.0)
HEMATOCRIT: 25 % — AB (ref 36.0–46.0)
HEMOGLOBIN: 12.9 g/dL (ref 12.0–15.0)
HEMOGLOBIN: 11.6 g/dL — AB (ref 12.0–15.0)
Hemoglobin: 8.8 g/dL — ABNORMAL LOW (ref 12.0–15.0)
Hemoglobin: 8.5 g/dL — ABNORMAL LOW (ref 12.0–15.0)
Hemoglobin: 9.5 g/dL — ABNORMAL LOW (ref 12.0–15.0)
Hemoglobin: 10.9 g/dL — ABNORMAL LOW (ref 12.0–15.0)
POTASSIUM: 3.8 meq/L (ref 3.7–5.3)
POTASSIUM: 4.2 meq/L (ref 3.7–5.3)
POTASSIUM: 4.8 meq/L (ref 3.7–5.3)
Potassium: 3.4 mEq/L — ABNORMAL LOW (ref 3.7–5.3)
Potassium: 3.8 mEq/L (ref 3.7–5.3)
Potassium: 3.5 mEq/L — ABNORMAL LOW (ref 3.7–5.3)
SODIUM: 141 meq/L (ref 137–147)
Sodium: 142 mEq/L (ref 137–147)
Sodium: 138 mEq/L (ref 137–147)
Sodium: 142 mEq/L (ref 137–147)
Sodium: 141 mEq/L (ref 137–147)
Sodium: 144 mEq/L (ref 137–147)

## 2013-10-20 LAB — CREATININE, SERUM
Creatinine, Ser: 0.73 mg/dL (ref 0.50–1.10)
GFR calc Af Amer: >90 mL/min (ref >90)
GFR calc non Af Amer: >90 mL/min (ref >90)

## 2013-10-20 LAB — GLUCOSE, CAPILLARY
GLUCOSE-CAPILLARY: 115 mg/dL — AB (ref 70–99)
GLUCOSE-CAPILLARY: 119 mg/dL — AB (ref 70–99)
GLUCOSE-CAPILLARY: 117 mg/dL — AB (ref 70–99)
Glucose-Capillary: 115 mg/dL — ABNORMAL HIGH (ref 70–99)
Glucose-Capillary: 118 mg/dL — ABNORMAL HIGH (ref 70–99)

## 2013-10-20 LAB — MAGNESIUM
Magnesium: 1.9 mg/dL (ref 1.5–2.5)
Magnesium: 2.6 mg/dL — ABNORMAL HIGH (ref 1.5–2.5)

## 2013-10-20 LAB — PREPARE RBC (CROSSMATCH)

## 2013-10-20 LAB — PLATELET COUNT: Platelets: 177 10*3/uL (ref 150–400)

## 2013-10-20 LAB — APTT: aPTT: 35 seconds (ref 24–37)

## 2013-10-20 LAB — HEMOGLOBIN AND HEMATOCRIT, BLOOD
HCT: 25.6 % — ABNORMAL LOW (ref 36.0–46.0)
Hemoglobin: 8.4 g/dL — ABNORMAL LOW (ref 12.0–15.0)

## 2013-10-20 LAB — ABO/RH: ABO/RH(D): AB POS

## 2013-10-20 SURGERY — CORONARY ARTERY BYPASS GRAFTING (CABG)
Anesthesia: General | Site: Chest

## 2013-10-20 MED ORDER — HEPARIN SODIUM (PORCINE) 1000 UNIT/ML IJ SOLN
INTRAMUSCULAR | Status: AC
  Filled 2013-10-20: qty 1

## 2013-10-20 MED ORDER — SODIUM CHLORIDE 0.9 % IV SOLN: 250.0000 mL | INTRAVENOUS | Status: DC

## 2013-10-20 MED ORDER — SODIUM CHLORIDE 0.9 % IJ SOLN
INTRAMUSCULAR | Status: DC | PRN
  Administered 2013-10-20 (×3): via TOPICAL

## 2013-10-20 MED ORDER — HEPARIN SODIUM (PORCINE) 1000 UNIT/ML IJ SOLN
INTRAMUSCULAR | Status: DC | PRN
  Administered 2013-10-20: 2000 [IU] via INTRAVENOUS
  Administered 2013-10-20: 5000 [IU] via INTRAVENOUS
  Administered 2013-10-20: 33000 [IU] via INTRAVENOUS

## 2013-10-20 MED ORDER — MIDAZOLAM HCL 10 MG/2ML IJ SOLN
INTRAMUSCULAR | Status: AC
  Filled 2013-10-20: qty 2

## 2013-10-20 MED ORDER — ACETAMINOPHEN 650 MG RE SUPP
650.0000 mg | Freq: Once | RECTAL | Status: AC
  Administered 2013-10-20: 650 mg via RECTAL

## 2013-10-20 MED ORDER — LEVOFLOXACIN IN D5W 750 MG/150ML IV SOLN
750.0000 mg | INTRAVENOUS | Status: AC
  Administered 2013-10-21: 750 mg via INTRAVENOUS
  Filled 2013-10-20: qty 150

## 2013-10-20 MED ORDER — PANTOPRAZOLE SODIUM 40 MG PO TBEC
40.0000 mg | DELAYED_RELEASE_TABLET | Freq: Every day | ORAL | Status: DC
  Administered 2013-10-22 – 2013-10-25 (×4): 40 mg via ORAL
  Filled 2013-10-20 (×4): qty 1

## 2013-10-20 MED ORDER — PHENYLEPHRINE 40 MCG/ML (10ML) SYRINGE FOR IV PUSH (FOR BLOOD PRESSURE SUPPORT)
PREFILLED_SYRINGE | INTRAVENOUS | Status: AC
  Filled 2013-10-20: qty 10

## 2013-10-20 MED ORDER — SODIUM CHLORIDE 0.9 % IJ SOLN
INTRAMUSCULAR | Status: AC
  Filled 2013-10-20: qty 10

## 2013-10-20 MED ORDER — LACTATED RINGERS IV SOLN
INTRAVENOUS | Status: DC | PRN
  Administered 2013-10-20: 07:00:00 via INTRAVENOUS

## 2013-10-20 MED ORDER — POTASSIUM CHLORIDE 10 MEQ/50ML IV SOLN
10.0000 meq | INTRAVENOUS | Status: AC
  Administered 2013-10-20 (×3): 10 meq via INTRAVENOUS

## 2013-10-20 MED ORDER — METOPROLOL TARTRATE 12.5 MG HALF TABLET
12.5000 mg | ORAL_TABLET | Freq: Two times a day (BID) | ORAL | Status: DC
  Administered 2013-10-22 – 2013-10-24 (×5): 12.5 mg via ORAL
  Filled 2013-10-20 (×11): qty 1

## 2013-10-20 MED ORDER — VANCOMYCIN HCL IN DEXTROSE 1-5 GM/200ML-% IV SOLN
1000.0000 mg | Freq: Once | INTRAVENOUS | Status: AC
  Administered 2013-10-20: 1000 mg via INTRAVENOUS
  Filled 2013-10-20: qty 200

## 2013-10-20 MED ORDER — PROTAMINE SULFATE 10 MG/ML IV SOLN
INTRAVENOUS | Status: AC
  Filled 2013-10-20: qty 5

## 2013-10-20 MED ORDER — PROTAMINE SULFATE 10 MG/ML IV SOLN
INTRAVENOUS | Status: DC | PRN
  Administered 2013-10-20: 350 mg via INTRAVENOUS

## 2013-10-20 MED ORDER — VECURONIUM BROMIDE 10 MG IV SOLR
INTRAVENOUS | Status: AC
  Filled 2013-10-20: qty 20

## 2013-10-20 MED ORDER — ROCURONIUM BROMIDE 50 MG/5ML IV SOLN
INTRAVENOUS | Status: AC
  Filled 2013-10-20: qty 1

## 2013-10-20 MED ORDER — PROTAMINE SULFATE 10 MG/ML IV SOLN
INTRAVENOUS | Status: AC
  Filled 2013-10-20: qty 25

## 2013-10-20 MED ORDER — INSULIN REGULAR BOLUS VIA INFUSION
0.0000 [IU] | Freq: Three times a day (TID) | INTRAVENOUS | Status: DC
  Filled 2013-10-20: qty 10

## 2013-10-20 MED ORDER — SODIUM CHLORIDE 0.45 % IV SOLN
INTRAVENOUS | Status: DC
  Administered 2013-10-20: 14:00:00 via INTRAVENOUS

## 2013-10-20 MED ORDER — SODIUM CHLORIDE 0.9 % IV SOLN
INTRAVENOUS | Status: DC
  Filled 2013-10-20: qty 1

## 2013-10-20 MED ORDER — LACTATED RINGERS IV SOLN: 500.0000 mL | Freq: Once | INTRAVENOUS | Status: AC | PRN

## 2013-10-20 MED ORDER — DEXMEDETOMIDINE HCL IN NACL 200 MCG/50ML IV SOLN
0.1000 ug/kg/h | INTRAVENOUS | Status: DC
Start: 2013-10-20 — End: 2013-10-22
  Administered 2013-10-20: 0.5 ug/kg/h via INTRAVENOUS
  Administered 2013-10-20: 0.698 ug/kg/h via INTRAVENOUS
  Filled 2013-10-20: qty 50

## 2013-10-20 MED ORDER — ASPIRIN EC 325 MG PO TBEC
325.0000 mg | DELAYED_RELEASE_TABLET | Freq: Every day | ORAL | Status: DC
  Administered 2013-10-21 – 2013-10-25 (×5): 325 mg via ORAL
  Filled 2013-10-20 (×5): qty 1

## 2013-10-20 MED ORDER — FENTANYL CITRATE 0.05 MG/ML IJ SOLN
INTRAMUSCULAR | Status: AC
  Filled 2013-10-20: qty 5

## 2013-10-20 MED ORDER — ACETAMINOPHEN 160 MG/5ML PO SOLN
1000.0000 mg | Freq: Four times a day (QID) | ORAL | Status: DC
  Filled 2013-10-20: qty 40

## 2013-10-20 MED ORDER — INSULIN ASPART 100 UNIT/ML ~~LOC~~ SOLN
0.0000 [IU] | SUBCUTANEOUS | Status: DC
  Administered 2013-10-21: 2 [IU] via SUBCUTANEOUS

## 2013-10-20 MED ORDER — LIDOCAINE HCL (CARDIAC) 20 MG/ML IV SOLN
INTRAVENOUS | Status: AC
  Filled 2013-10-20: qty 5

## 2013-10-20 MED ORDER — ACETAMINOPHEN 500 MG PO TABS
1000.0000 mg | ORAL_TABLET | Freq: Four times a day (QID) | ORAL | Status: DC
  Administered 2013-10-21 – 2013-10-25 (×17): 1000 mg via ORAL
  Filled 2013-10-20 (×20): qty 2

## 2013-10-20 MED ORDER — MAGNESIUM SULFATE 4000MG/100ML IJ SOLN
4.0000 g | Freq: Once | INTRAMUSCULAR | Status: AC
  Administered 2013-10-20: 4 g via INTRAVENOUS

## 2013-10-20 MED ORDER — ACETAMINOPHEN 160 MG/5ML PO SOLN: 650.0000 mg | Freq: Once | ORAL | Status: AC

## 2013-10-20 MED ORDER — SODIUM CHLORIDE 0.9 % IV SOLN: INTRAVENOUS | Status: DC

## 2013-10-20 MED ORDER — ALBUMIN HUMAN 5 % IV SOLN
250.0000 mL | INTRAVENOUS | Status: AC | PRN
  Administered 2013-10-20 (×2): 250 mL via INTRAVENOUS

## 2013-10-20 MED ORDER — MIDAZOLAM HCL 5 MG/5ML IJ SOLN
INTRAMUSCULAR | Status: DC | PRN
  Administered 2013-10-20: 1 mg via INTRAVENOUS
  Administered 2013-10-20: 4 mg via INTRAVENOUS

## 2013-10-20 MED ORDER — ASPIRIN 81 MG PO CHEW
324.0000 mg | CHEWABLE_TABLET | Freq: Every day | ORAL | Status: DC
  Filled 2013-10-20: qty 4

## 2013-10-20 MED ORDER — NITROGLYCERIN IN D5W 200-5 MCG/ML-% IV SOLN: 0.0000 ug/min | INTRAVENOUS | Status: DC

## 2013-10-20 MED ORDER — DOCUSATE SODIUM 100 MG PO CAPS
200.0000 mg | ORAL_CAPSULE | Freq: Every day | ORAL | Status: DC
  Administered 2013-10-21 – 2013-10-25 (×5): 200 mg via ORAL
  Filled 2013-10-20 (×5): qty 2

## 2013-10-20 MED ORDER — FAMOTIDINE IN NACL 20-0.9 MG/50ML-% IV SOLN
20.0000 mg | Freq: Two times a day (BID) | INTRAVENOUS | Status: AC
  Administered 2013-10-20: 20 mg via INTRAVENOUS

## 2013-10-20 MED ORDER — SODIUM CHLORIDE 0.9 % IJ SOLN
3.0000 mL | Freq: Two times a day (BID) | INTRAMUSCULAR | Status: DC
  Administered 2013-10-21 – 2013-10-24 (×5): 3 mL via INTRAVENOUS

## 2013-10-20 MED ORDER — MORPHINE SULFATE 2 MG/ML IJ SOLN
1.0000 mg | INTRAMUSCULAR | Status: AC | PRN
  Administered 2013-10-20: 4 mg via INTRAVENOUS
  Filled 2013-10-20: qty 2

## 2013-10-20 MED ORDER — MORPHINE SULFATE 2 MG/ML IJ SOLN
2.0000 mg | INTRAMUSCULAR | Status: DC | PRN
  Administered 2013-10-20 – 2013-10-21 (×6): 4 mg via INTRAVENOUS
  Administered 2013-10-22: 2 mg via INTRAVENOUS
  Filled 2013-10-20 (×6): qty 2

## 2013-10-20 MED ORDER — VECURONIUM BROMIDE 10 MG IV SOLR
INTRAVENOUS | Status: DC | PRN
  Administered 2013-10-20 (×2): 4 mg via INTRAVENOUS

## 2013-10-20 MED ORDER — ONDANSETRON HCL 4 MG/2ML IJ SOLN
4.0000 mg | Freq: Four times a day (QID) | INTRAMUSCULAR | Status: DC | PRN
  Administered 2013-10-21: 4 mg via INTRAVENOUS
  Filled 2013-10-20 (×2): qty 2

## 2013-10-20 MED ORDER — PROPOFOL 10 MG/ML IV BOLUS
INTRAVENOUS | Status: DC | PRN
  Administered 2013-10-20: 70 mg via INTRAVENOUS

## 2013-10-20 MED ORDER — ROCURONIUM BROMIDE 100 MG/10ML IV SOLN
INTRAVENOUS | Status: DC | PRN
  Administered 2013-10-20 (×2): 50 mg via INTRAVENOUS

## 2013-10-20 MED ORDER — SODIUM CHLORIDE 0.9 % IJ SOLN: 3.0000 mL | INTRAMUSCULAR | Status: DC | PRN

## 2013-10-20 MED ORDER — METOPROLOL TARTRATE 25 MG/10 ML ORAL SUSPENSION
12.5000 mg | Freq: Two times a day (BID) | ORAL | Status: DC
  Filled 2013-10-20 (×11): qty 5

## 2013-10-20 MED ORDER — ALBUMIN HUMAN 5 % IV SOLN
INTRAVENOUS | Status: DC | PRN
  Administered 2013-10-20: 13:00:00 via INTRAVENOUS

## 2013-10-20 MED ORDER — LACTATED RINGERS IV SOLN
INTRAVENOUS | Status: DC
  Administered 2013-10-21: 18:00:00 via INTRAVENOUS

## 2013-10-20 MED ORDER — BISACODYL 5 MG PO TBEC
10.0000 mg | DELAYED_RELEASE_TABLET | Freq: Every day | ORAL | Status: DC
  Administered 2013-10-21 – 2013-10-24 (×4): 10 mg via ORAL
  Filled 2013-10-20 (×4): qty 2

## 2013-10-20 MED ORDER — PROPOFOL 10 MG/ML IV BOLUS
INTRAVENOUS | Status: AC
  Filled 2013-10-20: qty 20

## 2013-10-20 MED ORDER — FENTANYL CITRATE 0.05 MG/ML IJ SOLN
INTRAMUSCULAR | Status: DC | PRN
  Administered 2013-10-20: 450 ug via INTRAVENOUS
  Administered 2013-10-20: 50 ug via INTRAVENOUS
  Administered 2013-10-20: 200 ug via INTRAVENOUS
  Administered 2013-10-20: 300 ug via INTRAVENOUS
  Administered 2013-10-20: 50 ug via INTRAVENOUS
  Administered 2013-10-20: 100 ug via INTRAVENOUS
  Administered 2013-10-20: 50 ug via INTRAVENOUS
  Administered 2013-10-20: 300 ug via INTRAVENOUS

## 2013-10-20 MED ORDER — SUCCINYLCHOLINE CHLORIDE 20 MG/ML IJ SOLN
INTRAMUSCULAR | Status: AC
  Filled 2013-10-20: qty 1

## 2013-10-20 MED ORDER — KETOROLAC TROMETHAMINE 15 MG/ML IJ SOLN
15.0000 mg | Freq: Four times a day (QID) | INTRAMUSCULAR | Status: AC
  Administered 2013-10-20 – 2013-10-21 (×2): 15 mg via INTRAVENOUS
  Filled 2013-10-20 (×2): qty 1

## 2013-10-20 MED ORDER — METOPROLOL TARTRATE 1 MG/ML IV SOLN: 2.5000 mg | INTRAVENOUS | Status: DC | PRN

## 2013-10-20 MED ORDER — PHENYLEPHRINE HCL 10 MG/ML IJ SOLN
0.0000 ug/min | INTRAVENOUS | Status: DC
  Administered 2013-10-21: 35 ug/min via INTRAVENOUS
  Filled 2013-10-20 (×2): qty 2

## 2013-10-20 MED ORDER — BISACODYL 10 MG RE SUPP: 10.0000 mg | Freq: Every day | RECTAL | Status: DC

## 2013-10-20 MED ORDER — MIDAZOLAM HCL 2 MG/2ML IJ SOLN: 2.0000 mg | INTRAMUSCULAR | Status: DC | PRN

## 2013-10-20 MED ORDER — MAGNESIUM SULFATE 40 MG/ML IJ SOLN
INTRAMUSCULAR | Status: AC
  Filled 2013-10-20: qty 100

## 2013-10-20 MED ORDER — LEVALBUTEROL HCL 0.63 MG/3ML IN NEBU
0.6300 mg | INHALATION_SOLUTION | Freq: Three times a day (TID) | RESPIRATORY_TRACT | Status: DC
  Administered 2013-10-20 – 2013-10-25 (×14): 0.63 mg via RESPIRATORY_TRACT
  Filled 2013-10-20 (×30): qty 3

## 2013-10-20 MED ORDER — LACTATED RINGERS IV SOLN
INTRAVENOUS | Status: DC | PRN
  Administered 2013-10-20 (×2): via INTRAVENOUS

## 2013-10-20 MED ORDER — OXYCODONE HCL 5 MG PO TABS
5.0000 mg | ORAL_TABLET | ORAL | Status: DC | PRN
  Administered 2013-10-21 (×2): 5 mg via ORAL
  Administered 2013-10-22 – 2013-10-24 (×8): 10 mg via ORAL
  Administered 2013-10-24: 5 mg via ORAL
  Filled 2013-10-20 (×2): qty 2
  Filled 2013-10-20: qty 1
  Filled 2013-10-20 (×3): qty 2
  Filled 2013-10-20: qty 1
  Filled 2013-10-20: qty 2
  Filled 2013-10-20: qty 1
  Filled 2013-10-20 (×2): qty 2

## 2013-10-20 MED FILL — Sodium Bicarbonate IV Soln 8.4%: INTRAVENOUS | Qty: 50 | Status: AC

## 2013-10-20 MED FILL — Lidocaine HCl IV Inj 20 MG/ML: INTRAVENOUS | Qty: 5 | Status: AC

## 2013-10-20 MED FILL — Sodium Chloride IV Soln 0.9%: INTRAVENOUS | Qty: 2000 | Status: AC

## 2013-10-20 MED FILL — Electrolyte-R (PH 7.4) Solution: INTRAVENOUS | Qty: 5000 | Status: AC

## 2013-10-20 MED FILL — Mannitol IV Soln 20%: INTRAVENOUS | Qty: 500 | Status: AC

## 2013-10-20 MED FILL — Heparin Sodium (Porcine) Inj 1000 Unit/ML: INTRAMUSCULAR | Qty: 20 | Status: AC

## 2013-10-20 SURGICAL SUPPLY — 108 items
ADAPTER CARDIO PERF ANTE/RETRO (ADAPTER) ×4 IMPLANT
ADPR PRFSN 84XANTGRD RTRGD (ADAPTER) ×2
ATTRACTOMAT 16X20 MAGNETIC DRP (DRAPES) ×4 IMPLANT
BAG DECANTER FOR FLEXI CONT (MISCELLANEOUS) ×4 IMPLANT
BANDAGE ELASTIC 4 VELCRO ST LF (GAUZE/BANDAGES/DRESSINGS) ×4 IMPLANT
BANDAGE ELASTIC 6 VELCRO ST LF (GAUZE/BANDAGES/DRESSINGS) ×4 IMPLANT
BANDAGE GAUZE ELAST BULKY 4 IN (GAUZE/BANDAGES/DRESSINGS) ×4 IMPLANT
BASKET HEART  (ORDER IN 25'S) (MISCELLANEOUS) ×1
BASKET HEART (ORDER IN 25'S) (MISCELLANEOUS) ×1
BASKET HEART (ORDER IN 25S) (MISCELLANEOUS) ×2 IMPLANT
BINDER BREAST 3XL (BIND) ×4 IMPLANT
BLADE STERNUM SYSTEM 6 (BLADE) ×4 IMPLANT
BLADE SURG 11 STRL SS (BLADE) ×4 IMPLANT
BLADE SURG 12 STRL SS (BLADE) ×4 IMPLANT
BLADE SURG ROTATE 9660 (MISCELLANEOUS) IMPLANT
CANISTER SUCTION 2500CC (MISCELLANEOUS) ×4 IMPLANT
CANNULA GUNDRY RCSP 15FR (MISCELLANEOUS) ×4 IMPLANT
CANNULA VENOUS LOW PROF 32X40 (CANNULA) IMPLANT
CARDIAC SUCTION (MISCELLANEOUS) ×4 IMPLANT
CATH CPB KIT VANTRIGT (MISCELLANEOUS) ×4 IMPLANT
CATH ROBINSON RED A/P 18FR (CATHETERS) ×12 IMPLANT
CATH THORACIC 36FR RT ANG (CATHETERS) ×4 IMPLANT
CLIP TI WIDE RED SMALL 24 (CLIP) ×4 IMPLANT
COVER SURGICAL LIGHT HANDLE (MISCELLANEOUS) ×4 IMPLANT
CRADLE DONUT ADULT HEAD (MISCELLANEOUS) ×4 IMPLANT
DRAIN CHANNEL 32F RND 10.7 FF (WOUND CARE) ×4 IMPLANT
DRAPE CARDIOVASCULAR INCISE (DRAPES) ×3
DRAPE SLUSH/WARMER DISC (DRAPES) ×4 IMPLANT
DRAPE SRG 135X102X78XABS (DRAPES) ×2 IMPLANT
DRSG AQUACEL AG ADV 3.5X14 (GAUZE/BANDAGES/DRESSINGS) ×4 IMPLANT
ELECT BLADE 4.0 EZ CLEAN MEGAD (MISCELLANEOUS) ×4
ELECT BLADE 6.5 EXT (BLADE) ×4 IMPLANT
ELECT CAUTERY BLADE 6.4 (BLADE) ×4 IMPLANT
ELECT REM PT RETURN 9FT ADLT (ELECTROSURGICAL) ×8
ELECTRODE BLDE 4.0 EZ CLN MEGD (MISCELLANEOUS) ×2 IMPLANT
ELECTRODE REM PT RTRN 9FT ADLT (ELECTROSURGICAL) ×4 IMPLANT
GLOVE BIO SURGEON STRL SZ 6 (GLOVE) ×20 IMPLANT
GLOVE BIO SURGEON STRL SZ 6.5 (GLOVE) ×21 IMPLANT
GLOVE BIO SURGEON STRL SZ7.5 (GLOVE) ×12 IMPLANT
GLOVE BIO SURGEONS STRL SZ 6.5 (GLOVE) ×7
GLOVE BIOGEL PI IND STRL 6 (GLOVE) ×2 IMPLANT
GLOVE BIOGEL PI IND STRL 6.5 (GLOVE) ×2 IMPLANT
GLOVE BIOGEL PI IND STRL 7.0 (GLOVE) ×12 IMPLANT
GLOVE BIOGEL PI INDICATOR 6 (GLOVE) ×2
GLOVE BIOGEL PI INDICATOR 6.5 (GLOVE) ×2
GLOVE BIOGEL PI INDICATOR 7.0 (GLOVE) ×12
GOWN STRL REUS W/ TWL LRG LVL3 (GOWN DISPOSABLE) ×16 IMPLANT
GOWN STRL REUS W/TWL LRG LVL3 (GOWN DISPOSABLE) ×32
HEMOSTAT POWDER SURGIFOAM 1G (HEMOSTASIS) ×12 IMPLANT
HEMOSTAT SURGICEL 2X14 (HEMOSTASIS) ×4 IMPLANT
INSERT FOGARTY XLG (MISCELLANEOUS) IMPLANT
KIT BASIN OR (CUSTOM PROCEDURE TRAY) ×4 IMPLANT
KIT ROOM TURNOVER OR (KITS) ×4 IMPLANT
KIT SUCTION CATH 14FR (SUCTIONS) ×4 IMPLANT
KIT VASOVIEW W/TROCAR VH 2000 (KITS) ×4 IMPLANT
LEAD PACING MYOCARDI (MISCELLANEOUS) ×4 IMPLANT
MARKER GRAFT CORONARY BYPASS (MISCELLANEOUS) ×12 IMPLANT
NS IRRIG 1000ML POUR BTL (IV SOLUTION) ×24 IMPLANT
PACK OPEN HEART (CUSTOM PROCEDURE TRAY) ×4 IMPLANT
PAD ARMBOARD 7.5X6 YLW CONV (MISCELLANEOUS) ×8 IMPLANT
PAD ELECT DEFIB RADIOL ZOLL (MISCELLANEOUS) ×4 IMPLANT
PENCIL BUTTON HOLSTER BLD 10FT (ELECTRODE) ×4 IMPLANT
PUNCH AORTIC ROTATE  4.5MM 8IN (MISCELLANEOUS) ×4 IMPLANT
PUNCH AORTIC ROTATE 4.0MM (MISCELLANEOUS) IMPLANT
PUNCH AORTIC ROTATE 4.5MM 8IN (MISCELLANEOUS) IMPLANT
PUNCH AORTIC ROTATE 5MM 8IN (MISCELLANEOUS) IMPLANT
SET CARDIOPLEGIA MPS 5001102 (MISCELLANEOUS) ×4 IMPLANT
SPOGE SURGIFLO 8M (HEMOSTASIS) ×2
SPONGE GAUZE 4X4 12PLY (GAUZE/BANDAGES/DRESSINGS) ×8 IMPLANT
SPONGE LAP 18X18 X RAY DECT (DISPOSABLE) ×4 IMPLANT
SPONGE LAP 4X18 X RAY DECT (DISPOSABLE) ×4 IMPLANT
SPONGE SURGIFLO 8M (HEMOSTASIS) ×2 IMPLANT
SURGIFLO W/THROMBIN 8M KIT (HEMOSTASIS) ×4 IMPLANT
SUT BONE WAX W31G (SUTURE) ×4 IMPLANT
SUT MNCRL AB 4-0 PS2 18 (SUTURE) ×4 IMPLANT
SUT PROLENE 3 0 SH DA (SUTURE) IMPLANT
SUT PROLENE 3 0 SH1 36 (SUTURE) IMPLANT
SUT PROLENE 4 0 RB 1 (SUTURE) ×2
SUT PROLENE 4 0 SH DA (SUTURE) ×4 IMPLANT
SUT PROLENE 4-0 RB1 .5 CRCL 36 (SUTURE) ×2 IMPLANT
SUT PROLENE 5 0 C 1 36 (SUTURE) IMPLANT
SUT PROLENE 6 0 C 1 30 (SUTURE) IMPLANT
SUT PROLENE 6 0 CC (SUTURE) ×12 IMPLANT
SUT PROLENE 8 0 BV175 6 (SUTURE) IMPLANT
SUT PROLENE BLUE 7 0 (SUTURE) ×12 IMPLANT
SUT SILK  1 MH (SUTURE)
SUT SILK 1 MH (SUTURE) IMPLANT
SUT SILK 2 0 SH CR/8 (SUTURE) ×4 IMPLANT
SUT SILK 3 0 SH CR/8 (SUTURE) IMPLANT
SUT STEEL 6MS V (SUTURE) ×8 IMPLANT
SUT STEEL STERNAL CCS#1 18IN (SUTURE) ×4 IMPLANT
SUT STEEL SZ 6 DBL 3X14 BALL (SUTURE) ×8 IMPLANT
SUT VIC AB 1 CTX 36 (SUTURE) ×8
SUT VIC AB 1 CTX36XBRD ANBCTR (SUTURE) ×4 IMPLANT
SUT VIC AB 2-0 CT1 27 (SUTURE)
SUT VIC AB 2-0 CT1 TAPERPNT 27 (SUTURE) IMPLANT
SUT VIC AB 2-0 CTX 27 (SUTURE) IMPLANT
SUT VIC AB 3-0 SH 27 (SUTURE) ×3
SUT VIC AB 3-0 SH 27X BRD (SUTURE) ×2 IMPLANT
SUT VIC AB 3-0 X1 27 (SUTURE) IMPLANT
SUTURE E-PAK OPEN HEART (SUTURE) ×4 IMPLANT
SYSTEM SAHARA CHEST DRAIN ATS (WOUND CARE) ×4 IMPLANT
TOWEL OR 17X24 6PK STRL BLUE (TOWEL DISPOSABLE) ×8 IMPLANT
TOWEL OR 17X26 10 PK STRL BLUE (TOWEL DISPOSABLE) ×8 IMPLANT
TRAY FOLEY IC TEMP SENS 16FR (CATHETERS) ×4 IMPLANT
TUBING INSUFFLATION 10FT LAP (TUBING) ×4 IMPLANT
UNDERPAD 30X30 INCONTINENT (UNDERPADS AND DIAPERS) ×4 IMPLANT
WATER STERILE IRR 1000ML POUR (IV SOLUTION) ×8 IMPLANT

## 2013-10-20 NOTE — Transfer of Care (Signed)
Immediate Anesthesia Transfer of Care Note  Patient: Amber Wong  Procedure(s) Performed: Procedure(s) with comments: CORONARY ARTERY BYPASS GRAFTING (CABG) (N/A) - CABG x 3 INTRAOPERATIVE TRANSESOPHAGEAL ECHOCARDIOGRAM (N/A)  Patient Location: SICU  Anesthesia Type:General  Level of Consciousness: sedated, unresponsive and Patient remains intubated per anesthesia plan  Airway & Oxygen Therapy: Patient remains intubated per anesthesia plan and Patient placed on Ventilator (see vital sign flow sheet for setting)  Post-op Assessment: Report given to PACU RN and Post -op Vital signs reviewed and stable  Post vital signs: Reviewed and stable  Complications: No apparent anesthesia complications

## 2013-10-20 NOTE — Progress Notes (Signed)
Patient expressed concern about healthcare power of attorney document.  She does not want her husband to make healthcare decisions and wishes for her son(s) to be the healthcare poa. Chaplain spoke with patient and family about this issue on Friday.  AC Kim called to bedside to notarize documents.

## 2013-10-20 NOTE — Progress Notes (Signed)
10/20/13 extubated pt per protocol with Fraser Din RN. Pt on 2L [place]. Per extubating pt pull 750 VT - 35 NIF pt is stable RT will continue to monitor.

## 2013-10-20 NOTE — Progress Notes (Signed)
The patient was examined and preop studies reviewed. There has been no change from the prior exam and the patient is ready for surgery.   Plan CABG on Amber Wong today

## 2013-10-20 NOTE — OR Nursing (Signed)
Family updated that patient is off-pump per Dr. Darcey Nora at [phone]

## 2013-10-20 NOTE — Brief Op Note (Signed)
10/15/2013 - 10/20/2013  11:33 AM  PATIENT:  Amber Wong  58 y.o. female  PRE-OPERATIVE DIAGNOSIS:  CAD  POST-OPERATIVE DIAGNOSIS:  CAD  PROCEDURE:  INTRAOPERATIVE TRANSESOPHAGEAL ECHOCARDIOGRAM, MEDIAN STERNOTOMY for CORONARY ARTERY BYPASS GRAFTING (CABG) x 3 (LIMA to LAD, SVG to OM, SVG to DIAGONAL) with EVH from right thigh  SURGEON:  Surgeon(s) and Role:    * Ivin Poot, MD - Primary  PHYSICIAN ASSISTANT: Lars Pinks PA-C  ANESTHESIA:   general  EBL:  Total I/O In: 1600 [I.V.:1600] Out: 2000 [Urine:2000]   DRAINS: Chest tubes in the mediastinal and pleural spaces   COUNTS CORRECT:  YES  DICTATION: .Dragon Dictation  PLAN OF CARE: Admit to inpatient   PATIENT DISPOSITION:  ICU - intubated and hemodynamically stable.   Delay start of Pharmacological VTE agent (>24hrs) due to surgical blood loss or risk of bleeding: yes  BASELINE WEIGHT: 104 kg

## 2013-10-20 NOTE — Anesthesia Postprocedure Evaluation (Signed)
  Anesthesia Post-op Note  Patient: Amber Wong  Procedure(s) Performed: Procedure(s) with comments: CORONARY ARTERY BYPASS GRAFTING (CABG) (N/A) - CABG x 3 INTRAOPERATIVE TRANSESOPHAGEAL ECHOCARDIOGRAM (N/A)  Patient Location: SICU  Anesthesia Type:General  Level of Consciousness: sedated, unresponsive and Patient remains intubated per anesthesia plan  Airway and Oxygen Therapy: Patient remains intubated per anesthesia plan and Patient placed on Ventilator (see vital sign flow sheet for setting)  Post-op Pain: none  Post-op Assessment: Post-op Vital signs reviewed, Patient's Cardiovascular Status Stable, Respiratory Function Stable, Patent Airway and No signs of Nausea or vomiting  Post-op Vital Signs: Reviewed and stable  Last Vitals:  Filed Vitals:   10/20/13 0427  BP: 147/83  Pulse:   Temp:   Resp:     Complications: No apparent anesthesia complications

## 2013-10-20 NOTE — Progress Notes (Signed)
EVENING ROUNDS NOTE :     [address]       ,[place] [address]             [phone]                 Day of Surgery Procedure(s) (LRB): CORONARY ARTERY BYPASS GRAFTING (CABG) (N/A) INTRAOPERATIVE TRANSESOPHAGEAL ECHOCARDIOGRAM (N/A)  Total Length of Stay:  LOS: 5 days  BP 98/66  Pulse 91  Temp(Src) 99 F (37.2 C) (Oral)  Resp 15  Ht 5\' 8"  (1.727 m)  Wt 230 lb (104.327 kg)  BMI 34.98 kg/m2  SpO2 98%  .Intake/Output     05/11 0701 - 05/12 0700   P.O.    I.V. (mL/kg) 2710.6 (26)   Blood 374   IV Piggyback 1300   Total Intake(mL/kg) 4384.6 (42)   Urine (mL/kg/hr) 4080 (3.2)   Blood 725 (0.6)   Chest Tube 180 (0.1)   Total Output 4985   Net -600.4         . sodium chloride 10 mL/hr at 10/20/13 1344  . sodium chloride    . [START ON 10/21/2013] sodium chloride    . dexmedetomidine Stopped (10/20/13 1732)  . lactated ringers    . nitroGLYCERIN Stopped (10/20/13 1423)  . phenylephrine (NEO-SYNEPHRINE) Adult infusion       Lab Results  Component Value Date   WBC 10.3 10/20/2013   HGB 11.2* 10/20/2013   HCT 33.0* 10/20/2013   PLT 154 10/20/2013   GLUCOSE 131* 10/20/2013   CHOL 180 10/17/2013   TRIG 98 10/17/2013   HDL 52 10/17/2013   LDLCALC 108* 10/17/2013   ALT 42* 02/18/2010   AST 40* 02/18/2010   NA 143 10/20/2013   K 4.1 10/20/2013   CL 107 10/20/2013   CREATININE 0.70 10/20/2013   BUN 7 10/20/2013   CO2 28 10/20/2013   TSH 1.934 02/18/2010   INR 1.52* 10/20/2013   HGBA1C 5.9* 10/17/2013   Now extubated, neuro intact 180 from chest tubes over 6 hours stable  Grace Isaac MD  Beeper [phone] Office [phone] 10/20/2013 7:12 PM

## 2013-10-20 NOTE — Progress Notes (Signed)
  Echocardiogram Echocardiogram Transesophageal has been performed.  Amber Wong [name] 10/20/2013, 10:42 AM

## 2013-10-20 NOTE — Anesthesia Preprocedure Evaluation (Signed)
Anesthesia Evaluation  Patient identified by MRN, date of birth, ID band Patient awake    Reviewed: Allergy & Precautions, H&P , NPO status , Patient's Chart, lab work & pertinent test results, reviewed documented beta blocker date and time   Airway Mallampati: II TM Distance: >3 FB Neck ROM: full    Dental   Pulmonary neg pulmonary ROS, shortness of breath and with exertion, asthma , sleep apnea , COPD COPD inhaler, former smoker,  breath sounds clear to auscultation        Cardiovascular hypertension, On Medications and On Home Beta Blockers + angina + CAD and +CHF Rhythm:regular     Neuro/Psych  Headaches, PSYCHIATRIC DISORDERS  Neuromuscular disease    GI/Hepatic Neg liver ROS, GERD-  Medicated and Controlled,  Endo/Other  negative endocrine ROS  Renal/GU negative Renal ROS  negative genitourinary   Musculoskeletal   Abdominal   Peds  Hematology negative hematology ROS (+)   Anesthesia Other Findings See surgeon's H&P   Reproductive/Obstetrics negative OB ROS                           Anesthesia Physical Anesthesia Plan  ASA: III  Anesthesia Plan: General   Post-op Pain Management:    Induction: Intravenous  Airway Management Planned: Oral ETT  Additional Equipment: Arterial line, PA Cath, CVP, TEE and Ultrasound Guidance Line Placement  Intra-op Plan:   Post-operative Plan: Post-operative intubation/ventilation  Informed Consent: I have reviewed the patients History and Physical, chart, labs and discussed the procedure including the risks, benefits and alternatives for the proposed anesthesia with the patient or authorized representative who has indicated his/her understanding and acceptance.   Dental Advisory Given  Plan Discussed with: CRNA and Surgeon  Anesthesia Plan Comments:         Anesthesia Quick Evaluation

## 2013-10-20 NOTE — Progress Notes (Signed)
CSW notified pt likely will need SNF at discharge. Pt currently sedated, unresponsive and remains intubated per anesthesia plan. Pt has Medicaid, so she will need to agree to staying at [facility] for 30+ days, per Medicaid regulation. CSW will follow up with pt once able to participate in conversation about short-term SNF and explain these requirements.   Ky Barban, MSW, [facility] Clinical Social Worker [phone]

## 2013-10-21 ENCOUNTER — Inpatient Hospital Stay (HOSPITAL_COMMUNITY): Payer: Medicaid Other

## 2013-10-21 ENCOUNTER — Encounter (HOSPITAL_COMMUNITY): Payer: Self-pay | Admitting: Cardiothoracic Surgery

## 2013-10-21 LAB — CREATININE, SERUM
CREATININE: 0.81 mg/dL (ref 0.50–1.10)
GFR, EST NON AFRICAN AMERICAN: 79 mL/min — AB (ref >90)

## 2013-10-21 LAB — GLUCOSE, CAPILLARY
GLUCOSE-CAPILLARY: 124 mg/dL — AB (ref 70–99)
GLUCOSE-CAPILLARY: 132 mg/dL — AB (ref 70–99)
Glucose-Capillary: 110 mg/dL — ABNORMAL HIGH (ref 70–99)
Glucose-Capillary: 107 mg/dL — ABNORMAL HIGH (ref 70–99)
Glucose-Capillary: 122 mg/dL — ABNORMAL HIGH (ref 70–99)
Glucose-Capillary: 122 mg/dL — ABNORMAL HIGH (ref 70–99)

## 2013-10-21 LAB — CBC
HCT: 30.3 % — ABNORMAL LOW (ref 36.0–46.0)
HCT: 29.7 % — ABNORMAL LOW (ref 36.0–46.0)
HEMOGLOBIN: 9.9 g/dL — AB (ref 12.0–15.0)
Hemoglobin: 9.9 g/dL — ABNORMAL LOW (ref 12.0–15.0)
MCH: 29.2 pg (ref 26.0–34.0)
MCH: 29.4 pg (ref 26.0–34.0)
MCHC: 32.7 g/dL (ref 30.0–36.0)
MCHC: 33.3 g/dL (ref 30.0–36.0)
MCV: 89.4 fL (ref 78.0–100.0)
MCV: 88.1 fL (ref 78.0–100.0)
PLATELETS: 173 10*3/uL (ref 150–400)
PLATELETS: 179 10*3/uL (ref 150–400)
RBC: 3.39 MIL/uL — ABNORMAL LOW (ref 3.87–5.11)
RBC: 3.37 MIL/uL — AB (ref 3.87–5.11)
RDW: 15.7 % — ABNORMAL HIGH (ref 11.5–15.5)
RDW: 15.6 % — ABNORMAL HIGH (ref 11.5–15.5)
WBC: 12.1 10*3/uL — AB (ref 4.0–10.5)
WBC: 15.3 10*3/uL — AB (ref 4.0–10.5)

## 2013-10-21 LAB — POCT I-STAT, CHEM 8
BUN: 10 mg/dL (ref 6–23)
CREATININE: 0.9 mg/dL (ref 0.50–1.10)
Calcium, Ion: 1.21 mmol/L (ref 1.12–1.23)
Chloride: 101 mEq/L (ref 96–112)
Glucose, Bld: 135 mg/dL — ABNORMAL HIGH (ref 70–99)
HEMATOCRIT: 30 % — AB (ref 36.0–46.0)
HEMOGLOBIN: 10.2 g/dL — AB (ref 12.0–15.0)
POTASSIUM: 4.2 meq/L (ref 3.7–5.3)
SODIUM: 134 meq/L — AB (ref 137–147)
TCO2: 25 mmol/L (ref 0–100)

## 2013-10-21 LAB — MAGNESIUM
MAGNESIUM: 2.4 mg/dL (ref 1.5–2.5)
Magnesium: 2.3 mg/dL (ref 1.5–2.5)

## 2013-10-21 LAB — BASIC METABOLIC PANEL
BUN: 12 mg/dL (ref 6–23)
CHLORIDE: 107 meq/L (ref 96–112)
CO2: 23 mEq/L (ref 19–32)
Calcium: 7.8 mg/dL — ABNORMAL LOW (ref 8.4–10.5)
Creatinine, Ser: 0.82 mg/dL (ref 0.50–1.10)
GFR calc Af Amer: >90 mL/min (ref >90)
GFR, EST NON AFRICAN AMERICAN: 78 mL/min — AB (ref >90)
Glucose, Bld: 134 mg/dL — ABNORMAL HIGH (ref 70–99)
Potassium: 4.2 mEq/L (ref 3.7–5.3)
Sodium: 138 mEq/L (ref 137–147)

## 2013-10-21 MED ORDER — INSULIN DETEMIR 100 UNIT/ML ~~LOC~~ SOLN: 8.0000 [IU] | Freq: Once | SUBCUTANEOUS | Status: DC

## 2013-10-21 MED ORDER — INSULIN ASPART 100 UNIT/ML ~~LOC~~ SOLN
0.0000 [IU] | SUBCUTANEOUS | Status: DC
  Administered 2013-10-21 – 2013-10-22 (×3): 2 [IU] via SUBCUTANEOUS

## 2013-10-21 MED ORDER — TRAMADOL HCL 50 MG PO TABS
50.0000 mg | ORAL_TABLET | Freq: Four times a day (QID) | ORAL | Status: DC | PRN
Start: 2013-10-21 — End: 2013-10-25
  Administered 2013-10-21 (×2): 50 mg via ORAL
  Filled 2013-10-21 (×2): qty 1

## 2013-10-21 MED ORDER — FUROSEMIDE 10 MG/ML IJ SOLN
20.0000 mg | Freq: Every day | INTRAMUSCULAR | Status: DC
  Administered 2013-10-21 – 2013-10-22 (×2): 20 mg via INTRAVENOUS
  Filled 2013-10-21 (×4): qty 2

## 2013-10-21 MED FILL — Magnesium Sulfate Inj 50%: INTRAMUSCULAR | Qty: 10 | Status: AC

## 2013-10-21 MED FILL — Potassium Chloride Inj 2 mEq/ML: INTRAVENOUS | Qty: 40 | Status: AC

## 2013-10-21 MED FILL — Heparin Sodium (Porcine) Inj 1000 Unit/ML: INTRAMUSCULAR | Qty: 30 | Status: AC

## 2013-10-21 NOTE — Progress Notes (Signed)
1 Day Post-Op Procedure(s) (LRB): CORONARY ARTERY BYPASS GRAFTING (CABG) (N/A) INTRAOPERATIVE TRANSESOPHAGEAL ECHOCARDIOGRAM (N/A) Subjective: A-paced Resting comfortably Ready to mobilize OOB Objective: Vital signs in last 24 hours: Temp:  [96.3 F (35.7 C)-99.3 F (37.4 C)] 98.6 F (37 C) (05/12 0721) Pulse Rate:  [79-93] 88 (05/12 0721) Cardiac Rhythm:  [-] Atrial paced (05/12 0700) Resp:  [10-27] 18 (05/12 0721) BP: (71-136)/(43-84) 96/61 mmHg (05/12 0700) SpO2:  [97 %-100 %] 100 % (05/12 0721) Arterial Line BP: (70-119)/(50-71) 114/62 mmHg (05/12 0721) FiO2 (%):  [40 %-50 %] 40 % (05/11 1745) Weight:  [225 lb 1.4 oz (102.1 kg)] 225 lb 1.4 oz (102.1 kg) (05/12 0600)  Hemodynamic parameters for last 24 hours: PAP: (23-36)/(11-24) 36/19 mmHg CO:  [3.8 L/min-5.9 L/min] 5.9 L/min CI:  [2.5 L/min/m2-3.8 L/min/m2] 3.8 L/min/m2  Intake/Output from previous day: 05/11 0701 - 05/12 0700 In: 4604.6 [I.V.:2930.6; Blood:374; IV Piggyback:1300] Out: 63 [Urine:5380; Blood:725; Chest Tube:305] Intake/Output this shift:    Neuro intact extrem warm CXR wet  Lab Results:  Recent Labs  10/20/13 1850 10/20/13 1854 10/21/13 0342  WBC 10.3  --  12.1*  HGB 10.5* 11.2* 9.9*  HCT 32.1* 33.0* 30.3*  PLT 154  --  173   BMET:  Recent Labs  10/20/13 0355  10/20/13 1854 10/21/13 0342  NA 142  < > 143 138  K 3.8  < > 4.1 4.2  CL 101  --  107 107  CO2 28  --   --  23  GLUCOSE 87  < > 131* 134*  BUN 13  --  7 12  CREATININE 0.99  < > 0.70 0.82  CALCIUM 9.6  --   --  7.8*  < > = values in this interval not displayed.  PT/INR:  Recent Labs  10/20/13 1330  LABPROT 17.9*  INR 1.52*   ABG    Component Value Date/Time   PHART 7.378 10/20/2013 1850   HCO3 23.5 10/20/2013 1850   TCO2 23 10/20/2013 1854   ACIDBASEDEF 2.0 10/20/2013 1850   O2SAT 94.0 10/20/2013 1850   CBG (last 3)   Recent Labs  10/20/13 1914 10/20/13 2324 10/21/13 0400  GLUCAP 118* 117* 124*     Assessment/Plan: S/P Procedure(s) (LRB): CORONARY ARTERY BYPASS GRAFTING (CABG) (N/A) INTRAOPERATIVE TRANSESOPHAGEAL ECHOCARDIOGRAM (N/A) See progression orders Start bid lasix  LOS: 6 days    Tharon Aquas [facility] 10/21/2013

## 2013-10-21 NOTE — Op Note (Signed)
[name], Wong NO.:  [id number]  MEDICAL RECORD NO.:  [id number]  LOCATION:  2S01C                        FACILITY:  [facility]  PHYSICIAN:  Ivin Poot, M.D.  DATE OF BIRTH:  Jan 06, 1956  DATE OF PROCEDURE:  10/20/2013 DATE OF DISCHARGE:                              OPERATIVE REPORT   OPERATION: 1. Coronary artery bypass grafting x3 (left internal mammary artery to     left anterior descending, saphenous vein graft to obtuse marginal,     saphenous vein graft to diagonal). 2. Endoscopic harvest of right leg greater saphenous vein.  PREOPERATIVE DIAGNOSIS:  Unstable angina with 80% to 90% left main stenosis.  POSTOPERATIVE DIAGNOSIS:  Unstable angina with 80% to 90% left main stenosis.  SURGEON:  Ivin Poot, M.D.  ASSISTANT:  Lars Pinks, PA.  INDICATIONS:  The patient is a 58 year old obese remote smoker, who presented to the hospital with symptoms of unstable angina.  Cardiac catheterization demonstrated a significant 80% to 90% left main stenosis and surgical revascularization was recommended.  Prior to surgery, I examined the patient in her hospital room and reviewed results of the cardiac catheterization with the patient and family.  I discussed the indications and expected benefits of multivessel CABG for treatment of her left main coronary artery disease.  I discussed the major aspects of surgery including location of the surgical incisions, use of general anesthesia and cardiopulmonary bypass, and expected postoperative hospital recovery.  I discussed with the patient the risks to her of the operation including risks of MI, stroke, bleeding, blood transfusion requirement, postoperative arrhythmias, postoperative pacemaker requirement, postoperative pleural effusions, postoperative infections, and death.  After reviewing these issues, she demonstrated her understanding and agreed to proceed with surgery under what I felt was an  informed consent.  OPERATIVE FINDINGS: 1. Obese body habitus making exposure of the heart difficult. 2. Adequate conduit. 3. Small, but adequate targets. 4. No blood products required for this operation.  OPERATIVE PROCEDURE:  The patient was brought to the operating room and placed supine on the operating room table where general anesthesia was induced under invasive hemodynamic monitoring.  The chest, abdomen, and legs were prepped with Betadine and draped as a sterile field.  A sternal incision was made as the saphenous vein was harvested endoscopically from the right leg.  The left internal mammary artery was harvested as a pedicle graft from its origin at the subclavian vessels. It was a 1.5 mm vessel and after infusion with papaverine had excellent flow.  A sternal retractor was then placed and the pericardium was opened.  Pursestrings were placed in the ascending aorta and right atrium and after heparin was administered and the ACT documented as being therapeutic, the patient was cannulated and placed on cardiopulmonary bypass.  The coronary arteries were identified for grafting and the mammary artery and vein grafts were prepared for the distal anastomoses. Cardioplegia cannulas were placed for both antegrade and retrograde cold blood cardioplegia and the patient was cooled to 32 degrees.  The aortic crossclamp was applied.  900 mL of cold blood cardioplegia was delivered in split doses between the antegrade aortic and retrograde coronary sinus catheters.  There was good cardioplegic arrest and septal temperature dropped to  less than 12 degrees.  Cardioplegia was delivered every 20 minutes.  The distal coronary anastomoses were then performed.  The first distal anastomosis was to the OM branch of the left coronary.  This was a 1.5- mm vessel with proximal 80% to 90% left main stenosis.  A reverse saphenous vein was sewn end-to-side with running 7-0 Prolene with good flow  through the graft.  The second distal anastomosis was from the diagonal branch to the LAD. This had an ostial 70% stenosis at the LAD origin.  A reverse saphenous vein was sewn end-to-side with running 7-0 Prolene with good flow through the graft.  Cardioplegia was redosed.  The third distal anastomosis was to the mid LAD.  This was a 1.5-mm vessel with proximal 80% to 90% left main stenosis.  The left IMA pedicle was brought through an opening created in the left lateral pericardium, was brought down onto the LAD and sewn end-to-side with running 8-0 Prolene.  There was good flow through the anastomosis after briefly releasing the pedicle bulldog and the mammary artery.  The bulldog was reapplied and the pedicle was secured to the epicardium with 6-0 Prolene sutures.  Cardioplegia was redosed.  While the crossclamp was still in place, 2 proximal vein anastomoses were performed using a 4.5 mm punch running 6-0 Prolene.  Prior to tying down the final proximal anastomosis, air was vented from the coronaries with a dose of retrograde warm blood cardioplegia.  The crossclamp was removed.  The heart resumed a spontaneous rhythm.  The vein grafts were de-aired and opened, each had good flow and hemostasis was documented at the proximal and distal anastomoses.  The patient was rewarmed and reperfused.  Temporary pacing wires were applied.  The lungs re-expanded and the ventilator was resumed.  The patient was then weaned off cardiopulmonary bypass without pressors or inotropes.  Blood pressure and cardiac output were stable.  Echo showed good LV global function.  The cannulas were removed after protamine was administered.  There was no adverse reaction to the protamine.  mediastinum was irrigated with warm saline.  The superior pericardial fat was closed over the aorta.  The anterior mediastinal and left pleural chest tube were placed and brought out through separate incisions.  The  sternum was closed with a wire.  The pectoralis fascia was closed with running #1 Vicryl.  The subcutaneous and skin layers were closed in running Vicryl and sterile dressings were applied.  Total cardiopulmonary bypass time was 111 minutes.     Ivin Poot, M.D.     PV/MEDQ  D:  10/20/2013  T:  10/21/2013  Job:  [id number]  cc:   Shelva Majestic, M.D. Marcelino Duster, M.D.

## 2013-10-21 NOTE — Progress Notes (Signed)
[facility] BRIEF SICU PROGRESS NOTE  1 Day Post-Op  S/P Procedure(s) (LRB): CORONARY ARTERY BYPASS GRAFTING (CABG) (N/A) INTRAOPERATIVE TRANSESOPHAGEAL ECHOCARDIOGRAM (N/A)   Stable day AAI paced w/ stable BP on low dose Neo drip O2 sats 93% on RA UOP adequate  Plan: Continue current plan  Rexene Alberts 10/21/2013 4:44 PM

## 2013-10-22 ENCOUNTER — Inpatient Hospital Stay (HOSPITAL_COMMUNITY): Payer: Medicaid Other

## 2013-10-22 LAB — CBC
HCT: 27.9 % — ABNORMAL LOW (ref 36.0–46.0)
Hemoglobin: 8.9 g/dL — ABNORMAL LOW (ref 12.0–15.0)
MCH: 28.5 pg (ref 26.0–34.0)
MCHC: 31.9 g/dL (ref 30.0–36.0)
MCV: 89.4 fL (ref 78.0–100.0)
Platelets: 169 10*3/uL (ref 150–400)
RBC: 3.12 MIL/uL — ABNORMAL LOW (ref 3.87–5.11)
RDW: 16.2 % — ABNORMAL HIGH (ref 11.5–15.5)
WBC: 13.6 10*3/uL — ABNORMAL HIGH (ref 4.0–10.5)

## 2013-10-22 LAB — GLUCOSE, CAPILLARY
GLUCOSE-CAPILLARY: 93 mg/dL (ref 70–99)
Glucose-Capillary: 103 mg/dL — ABNORMAL HIGH (ref 70–99)
Glucose-Capillary: 101 mg/dL — ABNORMAL HIGH (ref 70–99)
Glucose-Capillary: 104 mg/dL — ABNORMAL HIGH (ref 70–99)
Glucose-Capillary: 111 mg/dL — ABNORMAL HIGH (ref 70–99)

## 2013-10-22 LAB — BASIC METABOLIC PANEL
BUN: 10 mg/dL (ref 6–23)
CO2: 28 mEq/L (ref 19–32)
Calcium: 8.4 mg/dL (ref 8.4–10.5)
Chloride: 104 mEq/L (ref 96–112)
Creatinine, Ser: 0.82 mg/dL (ref 0.50–1.10)
GFR calc Af Amer: >90 mL/min (ref >90)
GFR calc non Af Amer: 78 mL/min — ABNORMAL LOW (ref >90)
Glucose, Bld: 102 mg/dL — ABNORMAL HIGH (ref 70–99)
Potassium: 3.9 mEq/L (ref 3.7–5.3)
Sodium: 139 mEq/L (ref 137–147)

## 2013-10-22 MED ORDER — FE FUMARATE-B12-VIT C-FA-IFC PO CAPS
1.0000 | ORAL_CAPSULE | Freq: Three times a day (TID) | ORAL | Status: DC
  Administered 2013-10-22 – 2013-10-25 (×9): 1 via ORAL
  Filled 2013-10-22 (×12): qty 1

## 2013-10-22 MED ORDER — SODIUM CHLORIDE 0.9 % IV SOLN: 250.0000 mL | INTRAVENOUS | Status: DC | PRN

## 2013-10-22 MED ORDER — POTASSIUM CHLORIDE CRYS ER 20 MEQ PO TBCR
20.0000 meq | EXTENDED_RELEASE_TABLET | Freq: Two times a day (BID) | ORAL | Status: DC
  Administered 2013-10-22 (×2): 20 meq via ORAL
  Filled 2013-10-22 (×4): qty 1

## 2013-10-22 MED ORDER — SODIUM CHLORIDE 0.9 % IJ SOLN
3.0000 mL | Freq: Two times a day (BID) | INTRAMUSCULAR | Status: DC
  Administered 2013-10-22 – 2013-10-24 (×2): 3 mL via INTRAVENOUS

## 2013-10-22 MED ORDER — BUDESONIDE-FORMOTEROL FUMARATE 80-4.5 MCG/ACT IN AERO
2.0000 | INHALATION_SPRAY | Freq: Two times a day (BID) | RESPIRATORY_TRACT | Status: DC
  Administered 2013-10-22 – 2013-10-25 (×7): 2 via RESPIRATORY_TRACT
  Filled 2013-10-22: qty 6.9

## 2013-10-22 MED ORDER — INSULIN ASPART 100 UNIT/ML ~~LOC~~ SOLN: 0.0000 [IU] | Freq: Three times a day (TID) | SUBCUTANEOUS | Status: DC

## 2013-10-22 MED ORDER — SODIUM CHLORIDE 0.9 % IJ SOLN: 3.0000 mL | INTRAMUSCULAR | Status: DC | PRN

## 2013-10-22 MED ORDER — MAGNESIUM HYDROXIDE 400 MG/5ML PO SUSP
30.0000 mL | Freq: Every day | ORAL | Status: DC | PRN
Start: 2013-10-22 — End: 2013-10-25

## 2013-10-22 MED ORDER — FUROSEMIDE 40 MG PO TABS
40.0000 mg | ORAL_TABLET | Freq: Every day | ORAL | Status: DC
  Filled 2013-10-22: qty 1

## 2013-10-22 MED ORDER — MOVING RIGHT ALONG BOOK
Freq: Once | Status: AC
  Administered 2013-10-22: 17:00:00
  Filled 2013-10-22: qty 1

## 2013-10-22 MED ORDER — MOMETASONE FURO-FORMOTEROL FUM 100-5 MCG/ACT IN AERO: 2.0000 | INHALATION_SPRAY | Freq: Two times a day (BID) | RESPIRATORY_TRACT | Status: DC

## 2013-10-22 NOTE — Progress Notes (Signed)
Pt with complaints of on going pain from pm to am. Pain maintained at 6-7 with 50 mcgs fentanyl every 1 to 2 hours. Pt with incremental relief from pain but pain management seems to be an continuing issue for this pt.

## 2013-10-22 NOTE — Progress Notes (Signed)
Patient transferred with belongings, medications, and chart to room [place] [facility] with nurse. Attached to monitor and new nurse at bedside. No new problems at this time.

## 2013-10-22 NOTE — Progress Notes (Addendum)
      [address]       [place] [address]             [phone]      2 Days Post-Op Procedure(s) (LRB): CORONARY ARTERY BYPASS GRAFTING (CABG) (N/A) INTRAOPERATIVE TRANSESOPHAGEAL ECHOCARDIOGRAM (N/A)  Subjective:  :Ms. Ousmanealhassan states she wants to go home.  She is feeling pretty good, other than some incisional soreness.    Objective: Vital signs in last 24 hours: Temp:  [98.1 F (36.7 C)-99.1 F (37.3 C)] 98.5 F (36.9 C) (05/13 0722) Pulse Rate:  [79-101] 86 (05/13 0700) Cardiac Rhythm:  [-] Normal sinus rhythm (05/12 2000) Resp:  [8-27] 19 (05/13 0700) BP: (78-133)/(50-114) 111/62 mmHg (05/13 0700) SpO2:  [91 %-100 %] 93 % (05/13 0700) Arterial Line BP: (71-111)/(45-73) 89/54 mmHg (05/12 1630) Weight:  [241 lb 6.5 oz (109.5 kg)] 241 lb 6.5 oz (109.5 kg) (05/13 0600)  Hemodynamic parameters for last 24 hours: PAP: (25-41)/(10-22) 30/12 mmHg  Intake/Output from previous day: 05/12 0701 - 05/13 0700 In: 2951.6 [P.O.:1530; I.V.:1271.6; IV Piggyback:150] Out: 3025 [Urine:2945; Chest Tube:80]  General appearance: alert, cooperative and no distress Heart: regular rate and rhythm Lungs: diminished breath sounds bibasilar Abdomen: soft, non-tender; bowel sounds normal; no masses,  no organomegaly Extremities: edema trace Wound: clean and dry EVH sites, Aquacel remains in place over sternum  Lab Results:  Recent Labs  10/21/13 1640 10/21/13 1641 10/22/13 0430  WBC 15.3*  --  13.6*  HGB 9.9* 10.2* 8.9*  HCT 29.7* 30.0* 27.9*  PLT 179  --  169   BMET:  Recent Labs  10/21/13 0342  10/21/13 1641 10/22/13 0430  NA 138  --  134* 139  K 4.2  --  4.2 3.9  CL 107  --  101 104  CO2 23  --   --  28  GLUCOSE 134*  --  135* 102*  BUN 12  --  10 10  CREATININE 0.82  < > 0.90 0.82  CALCIUM 7.8*  --   --  8.4  < > = values in this interval not displayed.  PT/INR:  Recent Labs  10/20/13 1330  LABPROT 17.9*  INR 1.52*   ABG      Component Value Date/Time   PHART 7.378 10/20/2013 1850   HCO3 23.5 10/20/2013 1850   TCO2 25 10/21/2013 1641   ACIDBASEDEF 2.0 10/20/2013 1850   O2SAT 94.0 10/20/2013 1850   CBG (last 3)   Recent Labs  10/21/13 1914 10/21/13 2323 10/22/13 0338  GLUCAP 132* 122* 103*    Assessment/Plan: S/P Procedure(s) (LRB): CORONARY ARTERY BYPASS GRAFTING (CABG) (N/A) INTRAOPERATIVE TRANSESOPHAGEAL ECHOCARDIOGRAM (N/A)  1. CV- NSR, overriding pacer, remains on Neo at 10- will wean as tolerated 2. Pulm- increasing bibasilar atelectasis with possible small pleural effusions, wean oxygen as tolerated, will add flutter valve 3. Renal- creatinine WNL, + hypervolemia, will continue diuresis, add potassium supplementation 4. Expected blood loss anemia- Hgb down to 8.9, will monitor 5. CBGs controlled, patient is not a diabetic, will continue SSIP for now 6. Dispo- patient looks great, wishes to have foley removed, wean NEO as tolerated, repeat labs in AM, to 2000 once stable off gtts   LOS: 7 days    Ellwood Handler 10/22/2013  Transfer to stepdown 2 [place]  patient examined and medical record reviewed,agree with above note. Tharon Aquas Tr[place] 10/22/2013

## 2013-10-23 ENCOUNTER — Inpatient Hospital Stay (HOSPITAL_COMMUNITY): Payer: Medicaid Other

## 2013-10-23 LAB — TYPE AND SCREEN
ABO/RH(D): AB POS
Antibody Screen: NEGATIVE
Unit division: 0
Unit division: 0

## 2013-10-23 LAB — BASIC METABOLIC PANEL
BUN: 12 mg/dL (ref 6–23)
CO2: 24 mEq/L (ref 19–32)
Calcium: 8.6 mg/dL (ref 8.4–10.5)
Chloride: 102 mEq/L (ref 96–112)
Creatinine, Ser: 0.84 mg/dL (ref 0.50–1.10)
GFR calc Af Amer: 88 mL/min — ABNORMAL LOW (ref >90)
GFR calc non Af Amer: 76 mL/min — ABNORMAL LOW (ref >90)
Glucose, Bld: 89 mg/dL (ref 70–99)
Potassium: 5.2 mEq/L (ref 3.7–5.3)
Sodium: 137 mEq/L (ref 137–147)

## 2013-10-23 LAB — CBC
HCT: 27.2 % — ABNORMAL LOW (ref 36.0–46.0)
Hemoglobin: 8.8 g/dL — ABNORMAL LOW (ref 12.0–15.0)
MCH: 29.1 pg (ref 26.0–34.0)
MCHC: 32.4 g/dL (ref 30.0–36.0)
MCV: 90.1 fL (ref 78.0–100.0)
Platelets: 172 10*3/uL (ref 150–400)
RBC: 3.02 MIL/uL — ABNORMAL LOW (ref 3.87–5.11)
RDW: 16.1 % — ABNORMAL HIGH (ref 11.5–15.5)
WBC: 11.9 10*3/uL — ABNORMAL HIGH (ref 4.0–10.5)

## 2013-10-23 LAB — GLUCOSE, CAPILLARY
GLUCOSE-CAPILLARY: 87 mg/dL (ref 70–99)
Glucose-Capillary: 75 mg/dL (ref 70–99)
Glucose-Capillary: 87 mg/dL (ref 70–99)

## 2013-10-23 LAB — SPIROMETRY WITH GRAPH
FEF 25-75 Pre: 2.97 L/sec
FEF2575-%Pred-Pre: 120 %
FEV1-%Pred-Pre: 97 %
FEV1-Pre: 2.44 L
FEV1FVC-%Pred-Pre: 103 %
FEV6-%Pred-Pre: 94 %
FEV6-Pre: 2.93 L
FEV6FVC-%Pred-Pre: 102 %
FVC-%Pred-Pre: 92 %
FVC-Pre: 2.96 L
Pre FEV1/FVC ratio: 83 %
Pre FEV6/FVC Ratio: 99 %

## 2013-10-23 MED ORDER — FUROSEMIDE 10 MG/ML IJ SOLN
40.0000 mg | Freq: Once | INTRAMUSCULAR | Status: AC
  Administered 2013-10-23: 40 mg via INTRAVENOUS
  Filled 2013-10-23: qty 4

## 2013-10-23 MED ORDER — FUROSEMIDE 40 MG PO TABS
40.0000 mg | ORAL_TABLET | Freq: Every day | ORAL | Status: DC
  Administered 2013-10-24 – 2013-10-25 (×2): 40 mg via ORAL
  Filled 2013-10-23 (×2): qty 1

## 2013-10-23 MED ORDER — LACTULOSE 10 GM/15ML PO SOLN
20.0000 g | Freq: Once | ORAL | Status: AC
Start: 2013-10-23 — End: 2013-10-23
  Administered 2013-10-23: 20 g via ORAL
  Filled 2013-10-23: qty 30

## 2013-10-23 NOTE — Progress Notes (Addendum)
      [address]       [place] [address]             [phone]        3 Days Post-Op Procedure(s) (LRB): CORONARY ARTERY BYPASS GRAFTING (CABG) (N/A) INTRAOPERATIVE TRANSESOPHAGEAL ECHOCARDIOGRAM (N/A)  Subjective: Patient with some incisional pain. Is passing flatus but no bowel movement yet.  Objective: Vital signs in last 24 hours: Temp:  [98.2 F (36.8 C)-98.5 F (36.9 C)] 98.5 F (36.9 C) (05/13 2024) Pulse Rate:  [80-114] 80 (05/14 0500) Cardiac Rhythm:  [-] Normal sinus rhythm (05/14 0500) Resp:  [14-23] 18 (05/14 0500) BP: (91-136)/(49-95) 100/70 mmHg (05/14 0500) SpO2:  [88 %-100 %] 93 % (05/14 0500) Weight:  [241 lb 6.5 oz (109.5 kg)] 241 lb 6.5 oz (109.5 kg) (05/14 0500)  Pre op weight 104 kg Current Weight  10/23/13 241 lb 6.5 oz (109.5 kg)      Intake/Output from previous day: 05/13 0701 - 05/14 0700 In: 1503.8 [P.O.:1380; I.V.:123.8] Out: 1550 [Urine:1550]   Physical Exam:  Cardiovascular: RRR. Pulmonary: Clear to auscultation bilaterally; no rales, wheezes, or rhonchi. Abdomen: Soft, non tender, bowel sounds present. Extremities: Mild bilateral lower extremity edema. Wounds: A cell removed. Sternal wound is clean and dry. RLE extremity wound is clean and dry.  No erythema or signs of infection.  Lab Results: CBC: Recent Labs  10/22/13 0430 10/23/13 0315  WBC 13.6* 11.9*  HGB 8.9* 8.8*  HCT 27.9* 27.2*  PLT 169 172   BMET:  Recent Labs  10/22/13 0430 10/23/13 0315  NA 139 137  K 3.9 5.2  CL 104 102  CO2 28 24  GLUCOSE 102* 89  BUN 10 12  CREATININE 0.82 0.84  CALCIUM 8.4 8.6    PT/INR:  Lab Results  Component Value Date   INR 1.52* 10/20/2013   INR 1.12 10/15/2013   INR 1.1 12/28/2008   ABG:  INR: Will add last result for INR, ABG once components are confirmed Will add last 4 CBG results once components are confirmed  Assessment/Plan:  1. CV - SR in the 80's. On Lopressor 12.5 bid. 2.  Pulmonary  - CXR appears to show no pneumothorax, bibasilar atelectasis, small effusions. On 2 liters of oxygen via [place]. Encourage incentive spirometer and flutter valve 3. Volume Overload - On Lasix 40 daily. Will give IV this am 4.  Acute blood loss anemia - H and H stable at 8.8 and 27.2. Continue Trinsicon 5. CBGs  104/111/75. Pre op HGA1C 5.9. Is pre diabetic. Will need further surveillance as an outpatient. 6. Potassium 5.2-no daily supplementation 7. LOC constipation 8. Remove EPW in am  Donielle M ZimmermanPA-C 10/23/2013,7:31 AM  patient examined and medical record reviewed,agree with above note. Tharon Aquas Trigt 10/23/2013

## 2013-10-23 NOTE — Progress Notes (Signed)
CARDIAC REHAB PHASE I   PRE:  Rate/Rhythm: 94 SR with PVCs    BP: sitting 102/79    SaO2: 92 2L  MODE:  Ambulation: 550 ft   POST:  Rate/Rhythm: 118 ST    BP: sitting 113/82     SaO2: 92 2L  Pt motivated to walk. Used RW and 2L O2 however toward end of walk pt able to walk without RW. Steady. Some SOB and fatigue, also c/o sternal pain. HR elevated. To recliner after walk. Will continue to follow. [id number]   [place], ACSM 10/23/2013 11:37 AM

## 2013-10-23 NOTE — Discharge Summary (Signed)
Physician Discharge Summary       [address]       [place] [address]             [phone]    Patient ID: Amber Wong MRN: [id number] DOB/AGE: [birth year] 58 y.o.  Admit date: 10/15/2013 Discharge date: 10/23/2013  Admission Diagnoses: 1. Multivessel CAD 2. History of hypertension 3. History of morbid obesity 4. History of hypercholesterolemia 5. History of OSA 6. History of tobacco abuse 7. History of asthma  Discharge Diagnoses:  1. Multivessel CAD 2. History of hypertension 3. History of morbid obesity 4. History of hypercholesterolemia 5. History of OSA 6. History of tobacco abuse 7. History of asthma 8. Anemia  Procedure (s):  1. Cardiac catheterization done by Dr. Claiborne Billings on 10/16/2013: ANGIOGRAPHY:  The left main coronary artery was a large-caliber vessel that had 80% focal distal stenosis prior to bifurcating into the bifurcated into the LAD and left circumflex coronary artery.  The LAD had diffuse 50% proximal areas of irregularity before and after the first septal and diagonal vessel. The vessel extended to the LV apex.  The left circumflex coronary artery was angiographically normal and gave rise to one major obtuse marginal branch.  The RCA had a superior takeoff and was angiographically normal and gave rise to a large PDA and PLA vessel.  Left ventriculography revealed hyperdynamic LV contractility without focal segmental wall motion abnormalities. There was no evidence for mitral regurgitation.  Ejection fraction is 65%.   2 Coronary artery bypass grafting x3 (left internal mammary artery to left anterior descending, saphenous vein graft to obtuse marginal, saphenous vein graft to diagonal). with endoscopic harvest of right leg greater saphenous vein by Dr. Prescott Gum on 10/20/2013.   History of Presenting Illness: This is a 58 year old African American obese female who presents with symptoms of unstable angina, which have been  progressive in frequency and intensity over the past 3 days. Some of the pain is related to her position. She has no prior history of coronary disease, cardiac arrhythmia but has a strong family history of CAD and MI. She is a reformed smoker and has a history of hyperlipidemia, hypertension, and sleep apnea. Although her initial troponin was negative, she was felt to have possible unstable angina and underwent nuclear myocardial perfusion scan. Results showed low risk with EF of 73%. Her chest pain persisted and she underwent cardiac catheterization by Dr. Georgina Peer on 10/16/2013. Results demonstrated an 80% left main stenosis and moderate proximal LAD stenosis. RCA was not significantly diseased. LVEF was normal. Because of her left main stenosis, as well as her symptoms of unstable angina, she is felt a candidate for CABG. Potential risks, complications, and benefits of the surgery were discussed and she agreed to proceed with surgery. Duplex carotid US showed no significant carotid artery stenosis bilaterally. She underwent a CABG x 3 on 10/20/2013.  Brief Hospital Course:  The patient was extubated the evening of surgery without difficulty. She remained afebrile and hemodynamically stable. She was initially A paced and required Neo synephrine. Gordy Councilman, a line, chest tubes, and foley were removed early in the post operative course. Lopressor was started and titrated accordingly. She was volume over loaded and diuresed. She was weaned off the insulin drip. The patient's HGA1C pre op was 5.9. The patient was felt surgically stable for transfer from the ICU to [facility] for further convalescence on 10/22/2012. She continues to progress with cardiac rehab. She was ambulating on room air. She  has been tolerating a diet and has had a bowel movement. Epicardial pacing wires and chest tube sutures will be removed prior to discharge. Provided the patient remains afebrile, hemodynamically stable, and pending morning round  evaluation, she will be surgically stable for discharge on 10/25/2012.   Latest Vital Signs: Blood pressure 102/79, pulse 97, temperature 98.5 F (36.9 C), temperature source Oral, resp. rate 18, height 5\' 8"  (1.727 m), weight 241 lb 6.5 oz (109.5 kg), SpO2 93.00%.  Physical Exam: Cardiovascular: RRR.  Pulmonary: Clear to auscultation bilaterally; no rales, wheezes, or rhonchi.  Abdomen: Soft, non tender, bowel sounds present.  Extremities: Mild bilateral lower extremity edema.  Wounds: A cell removed. Sternal wound is clean and dry. RLE extremity wound is clean and dry. No erythema or signs of infection.   Discharge Condition:Stable  Recent laboratory studies:  Lab Results  Component Value Date   WBC 11.9* 10/23/2013   HGB 8.8* 10/23/2013   HCT 27.2* 10/23/2013   MCV 90.1 10/23/2013   PLT 172 10/23/2013   Lab Results  Component Value Date   NA 137 10/23/2013   K 5.2 10/23/2013   CL 102 10/23/2013   CO2 24 10/23/2013   CREATININE 0.84 10/23/2013   GLUCOSE 89 10/23/2013      Diagnostic Studies: Dg Chest 2 View  10/23/2013   CLINICAL DATA:  CABG, some soreness  EXAM: CHEST  2 VIEW  COMPARISON:  DG CHEST 1V PORT dated 10/22/2013  FINDINGS: Lines and tubes have been removed. Status post CABG. Mild cardiac silhouette widening postoperatively. Small left pleural effusion with consolidation medial left lower lobe with air bronchograms. No pneumothorax. No pulmonary edema. Mild right lower lobe atelectasis.  IMPRESSION: Left lower lobe consolidation, likely atelectasis, and small effusion postoperatively. Mild right lower lobe atelectasis.   Electronically Signed   By: Skipper Cliche M.D.   On: 10/23/2013 07:55   Discharge Medications:   Medication List    STOP taking these medications       amLODipine-benazepril 5-20 MG per capsule  Commonly known as:  LOTREL     aspirin 81 MG chewable tablet  Replaced by:  aspirin 325 MG EC tablet     indomethacin 50 MG capsule  Commonly known as:   INDOCIN      TAKE these medications       acyclovir 200 MG capsule  Commonly known as:  ZOVIRAX  Take 200 mg by mouth every morning.     albuterol 108 (90 BASE) MCG/ACT inhaler  Commonly known as:  PROVENTIL HFA;VENTOLIN HFA  Inhale 2 puffs into the lungs every 6 (six) hours as needed for wheezing.     aspirin 325 MG EC tablet  Take 1 tablet (325 mg total) by mouth daily.     atorvastatin 80 MG tablet  Commonly known as:  LIPITOR  Take 1 tablet (80 mg total) by mouth daily at 6 PM.     buPROPion 100 MG 12 hr tablet  Commonly known as:  WELLBUTRIN SR  Take 100 mg by mouth 2 (two) times daily.     cholecalciferol 1000 UNITS tablet  Commonly known as:  VITAMIN D  Take 1,000 Units by mouth every morning.     ferrous [id number] C-folic acid capsule  Commonly known as:  TRINSICON / FOLTRIN  Take 1 capsule by mouth 3 (three) times daily after meals.     Fluticasone-Salmeterol 100-50 MCG/DOSE Aepb  Commonly known as:  ADVAIR  Inhale 1 puff into the lungs every 12 (  twelve) hours.     lisinopril 10 MG tablet  Commonly known as:  PRINIVIL,ZESTRIL  Take 1 tablet (10 mg total) by mouth daily.     metoprolol tartrate 25 MG tablet  Commonly known as:  LOPRESSOR  Take 1 tablet (25 mg total) by mouth 2 (two) times daily.     montelukast 10 MG tablet  Commonly known as:  SINGULAIR  Take 10 mg by mouth at bedtime.     oxyCODONE 5 MG immediate release tablet  Commonly known as:  Oxy IR/ROXICODONE  Take 1-2 tablets (5-10 mg total) by mouth every 4 (four) hours as needed for moderate pain.     pantoprazole 40 MG tablet  Commonly known as:  PROTONIX  Take 40 mg by mouth every morning.     PARoxetine 20 MG tablet  Commonly known as:  PAXIL  Take 20 mg by mouth every morning.     sodium chloride 0.65 % Soln nasal spray  Commonly known as:  OCEAN  Place 1 spray into both nostrils 4 (four) times daily as needed for congestion.     vitamin E 400 UNIT capsule  Take 400  Units by mouth daily.        The patient has been discharged on:   1.Beta Blocker:  Yes [x   ]                              No   [   ]                              If No, reason:  2.Ace Inhibitor/ARB: Yes Blue.Reese  ]                                     No  [    ]                                     If No, reason:  3.Statin:   Yes [ x  ]                  No  [   ]                  If No, reason:  4.Ecasa:  Yes  [  x ]                  No   [   ]                  If No, reason:  Follow Up Appointments: Follow-up Information   Follow up with VAN Wilber Oliphant, MD On 11/19/2013. (PA/LAT CXR to be taken (at [facility] which is in the same building as Dr. Lucianne Lei Trigt's office) on 11/19/2013 at 11:00 am;Appointment time is at 12:00 pm)    Specialty:  Cardiothoracic Surgery   Contact information:   [place] [address] [phone]       Follow up with Tarri Fuller, PA-C On 11/11/2013. (Appointment time is at 8:00 am)    Specialty:  Physician Assistant   Contact information:   [address] [place] [address] [phone]  Follow up with Millsaps, Luane School, NP. (Call for a follow up appointment regarding further)    Contact information:   [facility] [place] [address] [phone]       Signed: Sharalyn Ink [facility] 10/23/2013, 12:20 PM

## 2013-10-23 NOTE — Progress Notes (Signed)
Clinical Social Work Department BRIEF PSYCHOSOCIAL ASSESSMENT 10/23/2013  Patient:  NEETI, KNUDTSON     Account Number:  [id number]     Admit date:  10/15/2013  Clinical Social Worker:  Freeman Caldron  Date/Time:  10/23/2013 03:19 PM  Referred by:  Physician  Date Referred:  10/23/2013 Referred for  SNF Placement   Other Referral:   Interview type:  Patient Other interview type:    PSYCHOSOCIAL DATA Living Status:  FAMILY Admitted from facility:   Level of care:   Primary support name:  Annye Rusk [phone] Primary support relationship to patient:  CHILD, ADULT Degree of support available:   Good--pt has support from adult children.    CURRENT CONCERNS Current Concerns  Post-Acute Placement   Other Concerns:    SOCIAL WORK ASSESSMENT / PLAN CSW introduced self and asked pt if she would be interested in SNF as this is what is being recommended. Pt refused SNF, states she is okay with home health, but she plans to return home with her son. CSW informed RNCM. CSW signing off.   Assessment/plan status:  No Further Intervention Required Other assessment/ plan:   Information/referral to community resources:   Pt refusing SNF, will go home with home health    PATIENT'S/FAMILY'S RESPONSE TO PLAN OF CARE: Good--pt friendly and participated in conversation with CSW. Pt adamant that she will go home with family.       Ky Barban, MSW, [facility] Clinical Social Worker [phone]

## 2013-10-23 NOTE — Progress Notes (Signed)
CPAP applied for patient on auto with min pressure of 4cmH2O and max pressure of 20 cmH2O.  Pt is tolerating CPAP at this time and vital signs are WNL.  RT will continue to monitor.

## 2013-10-23 NOTE — Discharge Instructions (Signed)
Activity: 1.May walk up steps                2.No lifting more than ten pounds for four weeks.                 3.No driving for four weeks.                4.Stop any activity that causes chest pain, shortness of breath, dizziness, sweating or excessive weakness.                5.Avoid straining.                6.Continue with your breathing exercises daily.  Diet: Diabetic diet and Low fat, Low salt diet  Wound Care: May shower.  Clean wounds with mild soap and water daily. Contact the office at [phone] if any problems arise.  Coronary Artery Bypass Grafting, Care After Refer to this sheet in the next few weeks. These instructions provide you with information on caring for yourself after your procedure. Your health care provider may also give you more specific instructions. Your treatment has been planned according to current medical practices, but problems sometimes occur. Call your health care provider if you have any problems or questions after your procedure. WHAT TO EXPECT AFTER THE PROCEDURE Recovery from surgery will be different for everyone. Some people feel well after 3 or 4 weeks, while for others it takes longer. After your procedure, it is typical to have the following:  Nausea and a lack of appetite.   Constipation.  Weakness and fatigue.   Depression or irritability.   Pain or discomfort at your incision site. HOME CARE INSTRUCTIONS  Only take over-the-counter or prescription medicines as directed by your health care provider. Take all medicines exactly as directed. Do not stop taking medicines or start any new medicines without first checking with your health care provider.   Take your pulse as directed by your health care provider.  Perform deep breathing as directed by your health care provider. If you were given a device called an incentive spirometer, use it to practice deep breathing several times a day. Support your chest with a pillow or your arms when  you take deep breaths or cough.  Keep incision areas clean, dry, and protected. Remove or change any bandages (dressings) only as directed by your health care provider. You may have skin adhesive strips over the incision areas. Do not take the strips off. They will fall off on their own.  Check incision areas daily for any swelling, redness, or drainage.  If incisions were made in your legs, do the following:  Avoid crossing your legs.   Avoid sitting for long periods of time. Change positions every 30 minutes.   Elevate your legs when you are sitting.   Wear compression stockings as directed by your health care provider. These stockings help keep blood clots from forming in your legs.  Take showers once your health care provider approves. Until then, only take sponge baths. Pat incisions dry. Do not rub incisions with a washcloth or towel. Do not take tub baths or go swimming until your health care provider approves.  Eat foods that are high in fiber, such as raw fruits and vegetables, whole grains, beans, and nuts. Meats should be lean cut. Avoid canned, processed, and fried foods.  Drink enough fluids to keep your urine clear or pale yellow.  Weigh yourself every day. This helps identify if you are  retaining fluid that may make your heart and lungs work harder.   Rest and limit activity as directed by your health care provider. You may be instructed to:  Stop any activity at once if you have chest pain, shortness of breath, irregular heartbeats, or dizziness. Get help right away if you have any of these symptoms.  Move around frequently for short periods or take short walks as directed by your health care provider. Increase your activities gradually. You may need physical therapy or cardiac rehabilitation to help strengthen your muscles and build your endurance.  Avoid lifting, pushing, or pulling anything heavier than 10 lb (4.5 kg) for at least 6 weeks after surgery.  Do not  drive until your health care provider approves.  Ask your health care provider when you may return to work and resume sexual activity.  Follow up with your health care provider as directed.  SEEK MEDICAL CARE IF:  You have swelling, redness, increasing pain, or drainage at the site of an incision.   You develop a fever.   You have swelling in your ankles or legs.   You have pain in your legs.   You have weight gain of 2 or more pounds a day.  You are nauseous or vomit.  You have diarrhea. SEEK IMMEDIATE MEDICAL CARE IF:  You have chest pain that goes to your jaw or arms.  You have shortness of breath.   You have a fast or irregular heartbeat.   You notice a "clicking" in your breastbone (sternum) when you move.   You have numbness or weakness in your arms or legs.  You feel dizzy or lightheaded.  MAKE SURE YOU:  Understand these instructions.  Will watch your condition.  Will get help right away if you are not doing well or get worse. Document Released: 12/16/2004 Document Revised: 01/29/2013 Document Reviewed: 11/05/2012 [facility] Patient Information 2014 [place].  Diabetes Meal Planning Guide The diabetes meal planning guide is a tool to help you plan your meals and snacks. It is important for people with diabetes to manage their blood glucose (sugar) levels. Choosing the right foods and the right amounts throughout your day will help control your blood glucose. Eating right can even help you improve your blood pressure and reach or maintain a healthy weight. CARBOHYDRATE COUNTING MADE EASY When you eat carbohydrates, they turn to sugar. This raises your blood glucose level. Counting carbohydrates can help you control this level so you feel better. When you plan your meals by counting carbohydrates, you can have more flexibility in what you eat and balance your medicine with your food intake. Carbohydrate counting simply means adding up the total  amount of carbohydrate grams in your meals and snacks. Try to eat about the same amount at each meal. Foods with carbohydrates are listed below. Each portion below is 1 carbohydrate serving or 15 grams of carbohydrates. Ask your dietician how many grams of carbohydrates you should eat at each meal or snack. Grains and Starches  1 slice bread.   English muffin or hotdog/hamburger bun.   cup cold cereal (unsweetened).   cup cooked pasta or rice.   cup starchy vegetables (corn, potatoes, peas, beans, winter squash).  1 tortilla (6 inches).   bagel.  1 waffle or pancake (size of a CD).   cup cooked cereal.  4 to 6 small crackers. *Whole grain is recommended. Fruit  1 cup fresh unsweetened berries, melon, papaya, pineapple.  1 small fresh fruit.  banana or mango.   cup fruit juice (4 oz unsweetened).   cup canned fruit in natural juice or water.  2 tbs dried fruit.  12 to 15 grapes or cherries. Milk and Yogurt  1 cup fat-free or 1% milk.  1 cup soy milk.  6 oz light yogurt with sugar-free sweetener.  6 oz low-fat soy yogurt.  6 oz plain yogurt. Vegetables  1 cup raw or  cup cooked is counted as 0 carbohydrates or a "free" food.  If you eat 3 or more servings at 1 meal, count them as 1 carbohydrate serving. Other Carbohydrates   oz chips or pretzels.   cup ice cream or frozen yogurt.   cup sherbet or sorbet.  2 inch square cake, no frosting.  1 tbs honey, sugar, jam, jelly, or syrup.  2 small cookies.  3 squares of graham crackers.  3 cups popcorn.  6 crackers.  1 cup broth-based soup.  Count 1 cup casserole or other mixed foods as 2 carbohydrate servings.  Foods with less than 20 calories in a serving may be counted as 0 carbohydrates or a "free" food. You may want to purchase a book or computer software that lists the carbohydrate gram counts of different foods. In addition, the nutrition facts panel on the labels of the foods  you eat are a good source of this information. The label will tell you how big the serving size is and the total number of carbohydrate grams you will be eating per serving. Divide this number by 15 to obtain the number of carbohydrate servings in a portion. Remember, 1 carbohydrate serving equals 15 grams of carbohydrate. SERVING SIZES Measuring foods and serving sizes helps you make sure you are getting the right amount of food. The list below tells how big or small some common serving sizes are.  1 oz.........4 stacked dice.  3 oz........Marland KitchenDeck of cards.  1 tsp.......Marland KitchenTip of little finger.  1 tbs......Marland KitchenMarland KitchenThumb.  2 tbs.......Marland KitchenGolf ball.   cup......Marland KitchenHalf of a fist.  1 cup.......Marland KitchenA fist. SAMPLE DIABETES MEAL PLAN Below is a sample meal plan that includes foods from the grain and starches, dairy, vegetable, fruit, and meat groups. A dietician can individualize a meal plan to fit your calorie needs and tell you the number of servings needed from each food group. However, controlling the total amount of carbohydrates in your meal or snack is more important than making sure you include all of the food groups at every meal. You may interchange carbohydrate containing foods (dairy, starches, and fruits). The meal plan below is an example of a 2000 calorie diet using carbohydrate counting. This meal plan has 17 carbohydrate servings. Breakfast  1 cup oatmeal (2 carb servings).   cup light yogurt (1 carb serving).  1 cup blueberries (1 carb serving).   cup almonds. Snack  1 large apple (2 carb servings).  1 low-fat string cheese stick. Lunch  Chicken breast salad.  1 cup spinach.   cup chopped tomatoes.  2 oz chicken breast, sliced.  2 tbs low-fat [place] dressing.  12 whole-wheat crackers (2 carb servings).  12 to 15 grapes (1 carb serving).  1 cup low-fat milk (1 carb serving). Snack  1 cup carrots.   cup hummus (1 carb serving). Dinner  3 oz broiled  salmon.  1 cup brown rice (3 carb servings). Snack  1  cups steamed broccoli (1 carb serving) drizzled with 1 tsp olive oil and lemon juice.  1 cup light pudding (2 carb  servings). DIABETES MEAL PLANNING WORKSHEET Your dietician can use this worksheet to help you decide how many servings of foods and what types of foods are right for you.  BREAKFAST Food Group and Servings / Carb Servings Grain/Starches __________________________________ Dairy __________________________________________ Vegetable ______________________________________ Fruit ___________________________________________ Meat __________________________________________ Fat ____________________________________________ LUNCH Food Group and Servings / Carb Servings Grain/Starches ___________________________________ Dairy ___________________________________________ Fruit ____________________________________________ Meat ___________________________________________ Fat _____________________________________________ Laural GoldenINNER Food Group and Servings / Carb Servings Grain/Starches ___________________________________ Dairy ___________________________________________ Fruit ____________________________________________ Meat ___________________________________________ Fat _____________________________________________ SNACKS Food Group and Servings / Carb Servings Grain/Starches ___________________________________ Dairy ___________________________________________ Vegetable _______________________________________ Fruit ____________________________________________ Meat ___________________________________________ Fat _____________________________________________ DAILY TOTALS Starches _________________________ Vegetable ________________________ Fruit ____________________________ Dairy ____________________________ Meat ____________________________ Fat ______________________________ Document Released: 02/23/2005 Document Revised:  08/21/2011 Document Reviewed: 01/04/2009 [employer] Patient Information 2014 [employer].

## 2013-10-24 LAB — GLUCOSE, CAPILLARY
GLUCOSE-CAPILLARY: 113 mg/dL — AB (ref 70–99)
Glucose-Capillary: 103 mg/dL — ABNORMAL HIGH (ref 70–99)
Glucose-Capillary: 107 mg/dL — ABNORMAL HIGH (ref 70–99)
Glucose-Capillary: 98 mg/dL (ref 70–99)

## 2013-10-24 LAB — BASIC METABOLIC PANEL
BUN: 11 mg/dL (ref 6–23)
CHLORIDE: 100 meq/L (ref 96–112)
CO2: 26 mEq/L (ref 19–32)
Calcium: 8.7 mg/dL (ref 8.4–10.5)
Creatinine, Ser: 0.79 mg/dL (ref 0.50–1.10)
GFR calc Af Amer: >90 mL/min (ref >90)
Glucose, Bld: 90 mg/dL (ref 70–99)
POTASSIUM: 4.3 meq/L (ref 3.7–5.3)
Sodium: 135 mEq/L — ABNORMAL LOW (ref 137–147)

## 2013-10-24 MED ORDER — POTASSIUM CHLORIDE CRYS ER 20 MEQ PO TBCR
20.0000 meq | EXTENDED_RELEASE_TABLET | Freq: Once | ORAL | Status: AC
  Administered 2013-10-24: 20 meq via ORAL
  Filled 2013-10-24: qty 1

## 2013-10-24 NOTE — Progress Notes (Addendum)
      [address]       [place] [address]             [phone]        4 Days Post-Op Procedure(s) (LRB): CORONARY ARTERY BYPASS GRAFTING (CABG) (N/A) INTRAOPERATIVE TRANSESOPHAGEAL ECHOCARDIOGRAM (N/A)  Subjective: Patient with some incisional pain.   Objective: Vital signs in last 24 hours: Temp:  [98 F (36.7 C)-98.4 F (36.9 C)] 98.4 F (36.9 C) (05/15 0424) Pulse Rate:  [79-97] 79 (05/15 0424) Cardiac Rhythm:  [-] Normal sinus rhythm (05/14 2033) Resp:  [18-20] 18 (05/15 0424) BP: (102-111)/(66-79) 102/73 mmHg (05/15 0424) SpO2:  [93 %-100 %] 100 % (05/15 0424) Weight:  [238 lb 1.6 oz (108.001 kg)] 238 lb 1.6 oz (108.001 kg) (05/15 0424)  Pre op weight 104 kg Current Weight  10/24/13 238 lb 1.6 oz (108.001 kg)      Intake/Output from previous day: 05/14 0701 - 05/15 0700 In: 360 [P.O.:360] Out: 1603 [Urine:1600; Stool:3]   Physical Exam:  Cardiovascular: RRR. Pulmonary: Clear to auscultation bilaterally; no rales, wheezes, or rhonchi. Abdomen: Soft, non tender, bowel sounds present. Extremities: Mild bilateral lower extremity edema. Wounds: A cell removed. Sternal wound is clean and dry. RLE extremity wound is clean and dry.  No erythema or signs of infection.  Lab Results: CBC:  Recent Labs  10/22/13 0430 10/23/13 0315  WBC 13.6* 11.9*  HGB 8.9* 8.8*  HCT 27.9* 27.2*  PLT 169 172   BMET:   Recent Labs  10/23/13 0315 10/24/13 0357  NA 137 135*  K 5.2 4.3  CL 102 100  CO2 24 26  GLUCOSE 89 90  BUN 12 11  CREATININE 0.84 0.79  CALCIUM 8.6 8.7    PT/INR:  Lab Results  Component Value Date   INR 1.52* 10/20/2013   INR 1.12 10/15/2013   INR 1.1 12/28/2008   ABG:  INR: Will add last result for INR, ABG once components are confirmed Will add last 4 CBG results once components are confirmed  Assessment/Plan:  1. CV - SR in the 80's. On Lopressor 12.5 bid. 2.  Pulmonary -  On 2 liters of oxygen via [place]-wean as  tolerates. Encourage incentive spirometer and flutter valve 3. Volume Overload - On Lasix 40 daily.  4.  Acute blood loss anemia - H and H stable at 8.8 and 27.2. Continue Trinsicon 5. Sodium 135-secondary to diuresis 6. Remove EPW  7. Remove sutures day of discharge 8.Possible discharge 1-2 days  Sharalyn Ink [facility] 10/24/2013,7:26 AM   Doing well POD # 4 Agree with plan as outlined above- cont lasix and wean O2 Patient examined and discharge instructions reviewed Will need HHN for restorative care poss ready for DC tomorrow May move to reg 2000 bed

## 2013-10-24 NOTE — Progress Notes (Signed)
EPW removed per MD orders and protocol; pt on bedrest for one hour; Q15 vitals for one hour; will continue to monitor; call bell within reach.  Carollee Sires, RN

## 2013-10-24 NOTE — Progress Notes (Signed)
Pt moved to room 2W15; reported to receiving RN; ambulated to room; pt tolerated walk well; pt is sitting in recliner with call bell within reach; tele box exchanged central tele called.  Carollee Sires, RN

## 2013-10-24 NOTE — Progress Notes (Signed)
[id number] Cardiac Rehab Completed discharge education with pt. She voices understanding. Pt agrees to [place]. CRP in [place], will send referral. Deon Pilling, RN 10/24/2013 3:51 PM

## 2013-10-24 NOTE — Progress Notes (Signed)
CARDIAC REHAB PHASE I   PRE:  Rate/Rhythm: 18 SR with PVC's  BP:  Supine:   Sitting: 121/81  Standing:    SaO2: 94 RA  MODE:  Ambulation: 550 ft   POST:  Rate/Rhythm: 99 SR with PVC's  BP:  Supine:   Sitting: 134/66  Standing:    SaO2: 94 RA 1015-1035 Assisted X 1 to ambulate. Gait steady with hand held assist. Pt able to walk 550 feet only c/o was of some SOB. RA sat in hall during walk 90%. She took two standing rest stops related to SOB. Pt back to recliner after walk with call light in reach.  Rodney Langton RN 10/24/2013 10:34 AM

## 2013-10-25 LAB — GLUCOSE, CAPILLARY: Glucose-Capillary: 89 mg/dL (ref 70–99)

## 2013-10-25 MED ORDER — FE FUMARATE-B12-VIT C-FA-IFC PO CAPS: 1.0000 | ORAL_CAPSULE | Freq: Three times a day (TID) | ORAL | Status: DC

## 2013-10-25 MED ORDER — METOPROLOL TARTRATE 25 MG PO TABS
25.0000 mg | ORAL_TABLET | Freq: Two times a day (BID) | ORAL | Status: DC
  Administered 2013-10-25: 25 mg via ORAL
  Filled 2013-10-25 (×2): qty 1

## 2013-10-25 MED ORDER — METOPROLOL TARTRATE 25 MG PO TABS: 25.0000 mg | ORAL_TABLET | Freq: Two times a day (BID) | ORAL | Status: DC

## 2013-10-25 MED ORDER — METOPROLOL TARTRATE 25 MG/10 ML ORAL SUSPENSION
12.5000 mg | Freq: Two times a day (BID) | ORAL | Status: DC
  Filled 2013-10-25 (×2): qty 5

## 2013-10-25 MED ORDER — LISINOPRIL 10 MG PO TABS
10.0000 mg | ORAL_TABLET | Freq: Every day | ORAL | Status: DC
  Administered 2013-10-25: 10 mg via ORAL
  Filled 2013-10-25: qty 1

## 2013-10-25 MED ORDER — ATORVASTATIN CALCIUM 80 MG PO TABS: 80.0000 mg | ORAL_TABLET | Freq: Every day | ORAL | Status: DC

## 2013-10-25 MED ORDER — OXYCODONE HCL 5 MG PO TABS: 5.0000 mg | ORAL_TABLET | ORAL | Status: DC | PRN

## 2013-10-25 MED ORDER — LISINOPRIL 10 MG PO TABS
10.0000 mg | ORAL_TABLET | Freq: Every day | ORAL | Status: DC
Start: 2013-10-25 — End: 2013-11-11

## 2013-10-25 MED ORDER — ASPIRIN 325 MG PO TBEC: 325.0000 mg | DELAYED_RELEASE_TABLET | Freq: Every day | ORAL | Status: DC

## 2013-10-25 NOTE — Progress Notes (Signed)
Late Entry:  @ 20:30  Patient refused CPAP. Prior to RT leaving room, patient stated she should wear CPAP and asked to be placed on at 22:00.  RT back @ 22:25 for CPAP placement, patient eating.  Patient stated to come back at midnight. At approximately 23:20 RT received phone call from RN and unit secretary stating patient refused CPAP tonight.

## 2013-10-25 NOTE — Progress Notes (Signed)
Removed CT sutures per MD order per hospital protocol. Applied benzoin and steri strips to site. Will continue to monitor closely. Glade Nurse, RN

## 2013-10-25 NOTE — Progress Notes (Signed)
[address]       [place] [address]             [phone]      5 Days Post-Op Procedure(s) (LRB): CORONARY ARTERY BYPASS GRAFTING (CABG) (N/A) INTRAOPERATIVE TRANSESOPHAGEAL ECHOCARDIOGRAM (N/A) Subjective: Feels quite well overall  Objective: Vital signs in last 24 hours: Temp:  [97.8 F (36.6 C)-98.7 F (37.1 C)] 98 F (36.7 C) (05/16 0406) Pulse Rate:  [74-90] 74 (05/16 0406) Cardiac Rhythm:  [-] Normal sinus rhythm (05/15 2026) Resp:  [18] 18 (05/16 0406) BP: (108-137)/(55-81) 127/73 mmHg (05/16 0406) SpO2:  [92 %-99 %] 94 % (05/16 0751) Weight:  [241 lb (109.317 kg)] 241 lb (109.317 kg) (05/16 0406)  Hemodynamic parameters for last 24 hours:    Intake/Output from previous day: 05/15 0701 - 05/16 0700 In: 720 [P.O.:720] Out: -  Intake/Output this shift:    General appearance: alert, cooperative and no distress Heart: regular rate and rhythm Lungs: mildly dim in bases Abdomen: benign Extremities: minor edema Wound: incis healing well  Lab Results:  Recent Labs  10/23/13 0315  WBC 11.9*  HGB 8.8*  HCT 27.2*  PLT 172   BMET:  Recent Labs  10/23/13 0315 10/24/13 0357  NA 137 135*  K 5.2 4.3  CL 102 100  CO2 24 26  GLUCOSE 89 90  BUN 12 11  CREATININE 0.84 0.79  CALCIUM 8.6 8.7    PT/INR: No results found for this basename: LABPROT, INR,  in the last 72 hours ABG    Component Value Date/Time   PHART 7.378 10/20/2013 1850   HCO3 23.5 10/20/2013 1850   TCO2 25 10/21/2013 1641   ACIDBASEDEF 2.0 10/20/2013 1850   O2SAT 94.0 10/20/2013 1850   CBG (last 3)   Recent Labs  10/24/13 2040 10/24/13 2359 10/25/13 0412  GLUCAP 107* 103* 89   Scheduled Meds: . acetaminophen  1,000 mg Oral 4 times per day   Or  . acetaminophen (TYLENOL) oral liquid 160 mg/5 mL  1,000 mg Per Tube 4 times per day  . acyclovir  200 mg Oral q morning - 10a  . aspirin EC  325 mg Oral Daily   Or  . aspirin  324 mg Per Tube Daily  .  atorvastatin  80 mg Oral q1800  . bisacodyl  10 mg Oral Daily   Or  . bisacodyl  10 mg Rectal Daily  . budesonide-formoterol  2 puff Inhalation BID  . buPROPion  100 mg Oral BID  . docusate sodium  200 mg Oral Daily  . ferrous [id number] C-folic acid  1 capsule Oral TID PC  . furosemide  40 mg Oral Daily  . levalbuterol  0.63 mg Nebulization TID  . metoprolol tartrate  12.5 mg Oral BID   Or  . metoprolol tartrate  12.5 mg Per Tube BID  . pantoprazole  40 mg Oral Daily  . PARoxetine  20 mg Oral Daily  . sodium chloride  3 mL Intravenous Q12H  . sodium chloride  3 mL Intravenous Q12H   Continuous Infusions: . sodium chloride Stopped (10/21/13 1200)  . sodium chloride Stopped (10/23/13 0817)  . sodium chloride    Dg Chest 2 View  10/23/2013   CLINICAL DATA:  CABG, some soreness  EXAM: CHEST  2 VIEW  COMPARISON:  DG CHEST 1V PORT dated 10/22/2013  FINDINGS: Lines and tubes have been removed. Status post CABG. Mild cardiac silhouette widening postoperatively. Small left pleural  effusion with consolidation medial left lower lobe with air bronchograms. No pneumothorax. No pulmonary edema. Mild right lower lobe atelectasis.  IMPRESSION: Left lower lobe consolidation, likely atelectasis, and small effusion postoperatively. Mild right lower lobe atelectasis.   Electronically Signed   By: Skipper Cliche M.D.   On: 10/23/2013 07:55   Dg Chest Port 1 View  10/22/2013   CLINICAL DATA:  postop cardiac surgery  EXAM: PORTABLE CHEST - 1 VIEW  COMPARISON:  DG CHEST 1V PORT dated 10/21/2013  FINDINGS: Interval removal of the Swan-Ganz catheter. The right IJ vascular sheath remains present. Median sternotomy. Low volumes are present with bilateral basilar atelectasis. The left thoracostomy tube has been removed. There is no left-sided pneumothorax. Retrocardiac density is present, likely representing atelectasis and small effusion.  IMPRESSION: Continuing removal of support apparatus with expected  postoperative appearance of the chest. Low volumes. Right IJ vascular sheath remains present.   Electronically Signed   By: Dereck Ligas M.D.   On: 10/22/2013 07:58   PRN Meds:.sodium chloride, magnesium hydroxide, metoprolol, ondansetron (ZOFRAN) IV, oxyCODONE, sodium chloride, sodium chloride, traMADol Assessment/Plan: S/P Procedure(s) (LRB): CORONARY ARTERY BYPASS GRAFTING (CABG) (N/A) INTRAOPERATIVE TRANSESOPHAGEAL ECHOCARDIOGRAM (N/A)  1 Having PVC's - will increase beta blocker, she is stable for discharge 2 will add low dose ace inhib    LOS: 10 days    John Giovanni 10/25/2013

## 2013-10-25 NOTE — Progress Notes (Signed)
CARDIAC REHAB PHASE I   RN in room with pt disconnecting monitor upon arrival.  Did not walk due to pt being discharged.  Asked pt if she had questions and re-discussed home exercise guidelines with pt.  Pt voiced understanding and did not have any further questions for cardiac rehab at this time.  0840  Lillia Dallas MS, ACSM RCEP 8:44 AM 10/25/2013

## 2013-10-31 NOTE — Discharge Summary (Signed)
patient examined and medical record reviewed,agree with above note. Amber Wong 10/31/2013

## 2013-11-05 DIAGNOSIS — Z48812 Encounter for surgical aftercare following surgery on the circulatory system: Secondary | ICD-10-CM

## 2013-11-05 DIAGNOSIS — I2510 Atherosclerotic heart disease of native coronary artery without angina pectoris: Secondary | ICD-10-CM

## 2013-11-11 ENCOUNTER — Encounter: Payer: Self-pay | Admitting: Physician Assistant

## 2013-11-11 ENCOUNTER — Ambulatory Visit (INDEPENDENT_AMBULATORY_CARE_PROVIDER_SITE_OTHER): Payer: Medicaid Other | Admitting: Physician Assistant

## 2013-11-11 VITALS — BP 100/60 | HR 67 | Ht 68.0 in | Wt 223.0 lb

## 2013-11-11 DIAGNOSIS — I2510 Atherosclerotic heart disease of native coronary artery without angina pectoris: Secondary | ICD-10-CM

## 2013-11-11 DIAGNOSIS — E669 Obesity, unspecified: Secondary | ICD-10-CM

## 2013-11-11 DIAGNOSIS — G4733 Obstructive sleep apnea (adult) (pediatric): Secondary | ICD-10-CM

## 2013-11-11 DIAGNOSIS — I10 Essential (primary) hypertension: Secondary | ICD-10-CM

## 2013-11-11 DIAGNOSIS — E785 Hyperlipidemia, unspecified: Secondary | ICD-10-CM

## 2013-11-11 MED ORDER — LISINOPRIL 10 MG PO TABS: 5.0000 mg | ORAL_TABLET | Freq: Every day | ORAL | Status: DC

## 2013-11-11 NOTE — Progress Notes (Signed)
Date:  11/11/2013   ID:  Amber Wong, DOB [birth year], MRN [id number]  PCP:  Imelda Pillow, NP  Primary Cardiologist:  Claiborne Billings     History of Present Illness: Amber Wong is a 58 y.o. female  This is a 58 year old African American obese female who presents with symptoms of unstable angina, which have been progressive in frequency and intensity over the past 3 days. Some of the pain is related to her position. She has no prior history of coronary disease, cardiac arrhythmia but has a strong family history of CAD and MI.  She is a reformed smoker and has a history of hyperlipidemia, hypertension, and sleep apnea. Although her initial troponin was negative, she was felt to have possible unstable angina and underwent nuclear myocardial perfusion scan. Results showed low risk with EF of 73%. Her chest pain persisted and she underwent cardiac catheterization by Dr. Georgina Peer on 10/16/2013. Results demonstrated an 80% left main stenosis and moderate proximal LAD stenosis. RCA was not significantly diseased. LVEF was normal. Because of her left main stenosis, as well as her symptoms of unstable angina, she is felt a candidate for CABG. Potential risks, complications, and benefits of the surgery were discussed and she agreed to proceed with surgery. Duplex carotid US showed no significant carotid artery stenosis bilaterally. She underwent a CABG x 3 on 10/20/2013(LIMA to LAD, SVG to OM, SVG to diag). [name] today for followup evaluation. She reports doing well and walking everyday basis. She does feel tired after walking.  She will be starting cardiac rehabilitation soon.  She also states that she's been trying to lose weight. The patient currently denies nausea, vomiting, fever, chest pain, shortness of breath, orthopnea, dizziness, PND, cough, congestion, abdominal pain, hematochezia, melena, lower extremity edema.  Wt Readings from Last 3 Encounters:  11/11/13 223 lb (101.152 kg)    10/25/13 241 lb (109.317 kg)  10/25/13 241 lb (109.317 kg)     Past Medical History  Diagnosis Date  . Asthma   . Hypertension   . Sleep apnea   . Nocturia   . Depression   . Morbid obesity   . OSA (obstructive sleep apnea)     w/ hypoventilation  . Hypercholesteremia   . Obstructive sleep apnea (adult) (pediatric) 12/27/2012    AHI 17.7 baseline, titrated to 6 cm , AHi now 2.4 - compliance     Current Outpatient Prescriptions  Medication Sig Dispense Refill  . acyclovir (ZOVIRAX) 200 MG capsule Take 200 mg by mouth every morning.      Marland Kitchen albuterol (PROVENTIL HFA;VENTOLIN HFA) 108 (90 BASE) MCG/ACT inhaler Inhale 2 puffs into the lungs every 6 (six) hours as needed for wheezing.       Marland Kitchen aspirin EC 325 MG EC tablet Take 1 tablet (325 mg total) by mouth daily.    0  . atorvastatin (LIPITOR) 80 MG tablet Take 1 tablet (80 mg total) by mouth daily at 6 PM.  30 tablet  1  . buPROPion (WELLBUTRIN SR) 100 MG 12 hr tablet Take 100 mg by mouth 2 (two) times daily.      . cholecalciferol (VITAMIN D) 1000 UNITS tablet Take 1,000 Units by mouth every morning.      . ferrous [id number] C-folic acid (TRINSICON / FOLTRIN) capsule Take 1 capsule by mouth 3 (three) times daily after meals.  90 capsule  0  . Fluticasone-Salmeterol (ADVAIR) 100-50 MCG/DOSE AEPB Inhale 1 puff into the lungs every 12 (twelve) hours.      Marland Kitchen  lisinopril (PRINIVIL,ZESTRIL) 10 MG tablet Take 0.5 tablets (5 mg total) by mouth daily.  30 tablet  1  . metoprolol tartrate (LOPRESSOR) 25 MG tablet Take 1 tablet (25 mg total) by mouth 2 (two) times daily.  60 tablet  1  . montelukast (SINGULAIR) 10 MG tablet Take 10 mg by mouth at bedtime.      Marland Kitchen oxyCODONE (OXY IR/ROXICODONE) 5 MG immediate release tablet Take 1-2 tablets (5-10 mg total) by mouth every 4 (four) hours as needed for moderate pain.  50 tablet  0  . pantoprazole (PROTONIX) 40 MG tablet Take 40 mg by mouth every morning.      Marland Kitchen PARoxetine (PAXIL) 20 MG tablet  Take 20 mg by mouth every morning.      . sodium chloride (OCEAN) 0.65 % SOLN nasal spray PT DOES NOT HAVE IT YET      . vitamin E 400 UNIT capsule Take 400 Units by mouth daily.       No current facility-administered medications for this visit.    Allergies:    Allergies  Allergen Reactions  . Iohexol Other (See Comments)     Code: HIVES, Desc: premedicated 2 hrs prior with 200mg  soulumedrol IV, no reaction s/p injection, Onset Date: [id number]   . Penicillins Other (See Comments)    REACTION: pruritis    Social History:  The patient  reports that she has quit smoking. Her smoking use included Cigarettes. She smoked 0.00 packs per day for 15 years. She has never used smokeless tobacco. She reports that she does not drink alcohol or use illicit drugs.   Family history:   Family History  Problem Relation Age of Onset  . Cancer Sister     double mastectomy  . Heart attack Sister     2 sisters in their 74's with MI/stents  . Heart attack Mother 50    died of MI  . Heart attack Father 65    died of MI    ROS:  Please see the history of present illness.  All other systems reviewed and negative.   PHYSICAL EXAM: VS:  BP 100/60  Pulse 67  Ht 5\' 8"  (1.727 m)  Wt 223 lb (101.152 kg)  BMI 33.91 kg/m2 Obese, well developed, in no acute distress HEENT: Pupils are equal round react to light accommodation extraocular movements are intact.  Neck: no JVDNo cervical lymphadenopathy. Cardiac: Regular rate and rhythm without murmurs rubs or gallops. Lungs:  clear to auscultation bilaterally, no wheezing, rhonchi or rales Abd: soft, nontender, positive bowel sounds all quadrants, no hepatosplenomegaly Ext: no lower extremity edema.  2+ radial and dorsalis pedis pulses. Skin: warm and dry, sternal incision appears to be healing well with no signs of infection Neuro:  Grossly normal  EKG:  Normal sinus rhythm a rate of 68 beats per minute     ASSESSMENT AND PLAN:  Problem List Items  Addressed This Visit   HYPERLIPIDEMIA     On statin    Relevant Medications      lisinopril (PRINIVIL,ZESTRIL) tablet   HYPERTENSION     Patient's blood pressures on low side. We'll drop her lisinopril to 5 mg daily.    Relevant Medications      lisinopril (PRINIVIL,ZESTRIL) tablet   Obstructive sleep apnea (adult) (pediatric)     Uses CPAP    Obesity, unspecified      She reports dietary changes eating more vegetables and fruits and has lost a little bit of weight  CAD (coronary artery disease)     The patient is status post coronary artery bypass grafting x3. She appears to be doing quite well. She is having some intermittent sharp pains here and there but overall has been able to do some walking.  She does get tired and then has to rest. She will start cardiac rehabilitation shortly and I asked her to not push too hard prior to starting the program.    Relevant Medications      lisinopril (PRINIVIL,ZESTRIL) tablet    Other Visit Diagnoses   HTN (hypertension)    -  Primary    Relevant Medications       lisinopril (PRINIVIL,ZESTRIL) tablet

## 2013-11-11 NOTE — Assessment & Plan Note (Signed)
The patient is status post coronary artery bypass grafting x3. She appears to be doing quite well. She is having some intermittent sharp pains here and there but overall has been able to do some walking.  She does get tired and then has to rest. She will start cardiac rehabilitation shortly and I asked her to not push too hard prior to starting the program.

## 2013-11-11 NOTE — Assessment & Plan Note (Signed)
Patient's blood pressures on low side. We'll drop her lisinopril to 5 mg daily.

## 2013-11-11 NOTE — Assessment & Plan Note (Signed)
On statin.

## 2013-11-11 NOTE — Assessment & Plan Note (Signed)
Uses CPAP 

## 2013-11-11 NOTE — Patient Instructions (Signed)
1.  Start taking lisinopril 5 mg daily.  You can cut your current pills in half. 2.  Follow up with Dr. Claiborne Billings

## 2013-11-11 NOTE — Assessment & Plan Note (Signed)
She reports dietary changes eating more vegetables and fruits and has lost a little bit of weight

## 2013-11-14 ENCOUNTER — Encounter: Payer: Self-pay | Admitting: Cardiovascular Disease

## 2013-11-14 ENCOUNTER — Other Ambulatory Visit: Payer: Self-pay | Admitting: *Deleted

## 2013-11-14 NOTE — Addendum Note (Signed)
Addended by: Dolan Amen on: 11/14/2013 04:15 PM   Modules accepted: Orders

## 2013-11-19 ENCOUNTER — Other Ambulatory Visit: Payer: Self-pay | Admitting: Cardiothoracic Surgery

## 2013-11-19 ENCOUNTER — Other Ambulatory Visit: Payer: Self-pay | Admitting: *Deleted

## 2013-11-19 ENCOUNTER — Ambulatory Visit
Admission: RE | Admit: 2013-11-19 | Discharge: 2013-11-19 | Disposition: A | Discharge status: Home / Self Care | Payer: Medicaid Other | Source: Ambulatory Visit | Attending: Cardiothoracic Surgery | Admitting: Cardiothoracic Surgery

## 2013-11-19 ENCOUNTER — Encounter: Payer: Self-pay | Admitting: Cardiothoracic Surgery

## 2013-11-19 ENCOUNTER — Ambulatory Visit (INDEPENDENT_AMBULATORY_CARE_PROVIDER_SITE_OTHER): Payer: Self-pay | Admitting: Cardiothoracic Surgery

## 2013-11-19 VITALS — BP 116/75 | HR 68 | Resp 16 | Ht 68.0 in | Wt 224.0 lb

## 2013-11-19 DIAGNOSIS — Z951 Presence of aortocoronary bypass graft: Secondary | ICD-10-CM

## 2013-11-19 DIAGNOSIS — I200 Unstable angina: Secondary | ICD-10-CM

## 2013-11-19 DIAGNOSIS — G8918 Other acute postprocedural pain: Secondary | ICD-10-CM

## 2013-11-19 DIAGNOSIS — I2510 Atherosclerotic heart disease of native coronary artery without angina pectoris: Secondary | ICD-10-CM

## 2013-11-19 MED ORDER — OXYCODONE HCL 5 MG PO TABS: 5.0000 mg | ORAL_TABLET | ORAL | Status: DC | PRN

## 2013-11-19 NOTE — Progress Notes (Signed)
PCP is Millsaps, Luane School, NP Referring Provider is Candee Furbish, MD  Chief Complaint  Patient presents with  . Routine Post Op    s/p CABG X 3 with a cxr    HPI: One returns for one month followup after urgent CABG x3 for 2 vessel CAD and unstable angina. She is done well. After living with her son per week she is now at home. She is walking minimally. She's had no recurrent angina. Surgical incisions are healing well. She's had no edema. She is been seen by the cardiology office. She plans on calling outpatient cardiac rehabilitation at the hospital.   Past Medical History  Diagnosis Date  . Asthma   . Hypertension   . Sleep apnea   . Nocturia   . Depression   . Morbid obesity   . OSA (obstructive sleep apnea)     w/ hypoventilation  . Hypercholesteremia   . Obstructive sleep apnea (adult) (pediatric) 12/27/2012    AHI 17.7 baseline, titrated to 6 cm , AHi now 2.4 - compliance     Past Surgical History  Procedure Laterality Date  . Partial hysterectomy    . Knee surgery      chronic pain and limping since  . Cystectomy    . Breast lumpectomy Right     benign at age 58  . Tonsillectomy    . Coronary artery bypass graft N/A 10/20/2013    Procedure: CORONARY ARTERY BYPASS GRAFTING (CABG);  Surgeon: Ivin Poot, MD;  Location: [facility];  Service: Open Heart Surgery;  Laterality: N/A;  CABG x 3  . Intraoperative transesophageal echocardiogram N/A 10/20/2013    Procedure: INTRAOPERATIVE TRANSESOPHAGEAL ECHOCARDIOGRAM;  Surgeon: Ivin Poot, MD;  Location: [facility];  Service: Open Heart Surgery;  Laterality: N/A;    Family History  Problem Relation Age of Onset  . Cancer Sister     double mastectomy  . Heart attack Sister     2 sisters in their 26's with MI/stents  . Heart attack Mother 22    died of MI  . Heart attack Father 33    died of MI    Social History History  Substance Use Topics  . Smoking status: Former Smoker -- 15 years    Types: Cigarettes  .  Smokeless tobacco: Never Used     Comment: Quit 2005  . Alcohol Use: No    Current Outpatient Prescriptions  Medication Sig Dispense Refill  . acyclovir (ZOVIRAX) 200 MG capsule Take 200 mg by mouth every morning.      Marland Kitchen albuterol (PROVENTIL HFA;VENTOLIN HFA) 108 (90 BASE) MCG/ACT inhaler Inhale 2 puffs into the lungs every 6 (six) hours as needed for wheezing.       Marland Kitchen aspirin EC 325 MG EC tablet Take 1 tablet (325 mg total) by mouth daily.    0  . atorvastatin (LIPITOR) 80 MG tablet Take 1 tablet (80 mg total) by mouth daily at 6 PM.  30 tablet  1  . buPROPion (WELLBUTRIN SR) 100 MG 12 hr tablet Take 100 mg by mouth 2 (two) times daily.      . cholecalciferol (VITAMIN D) 1000 UNITS tablet Take 1,000 Units by mouth every morning.      . ferrous [id number] C-folic acid (TRINSICON / FOLTRIN) capsule Take 1 capsule by mouth 3 (three) times daily after meals.  90 capsule  0  . Fluticasone-Salmeterol (ADVAIR) 100-50 MCG/DOSE AEPB Inhale 1 puff into the lungs every 12 (twelve) hours.      Marland Kitchen  lisinopril (PRINIVIL,ZESTRIL) 10 MG tablet Take 0.5 tablets (5 mg total) by mouth daily.  30 tablet  1  . metoprolol tartrate (LOPRESSOR) 25 MG tablet Take 1 tablet (25 mg total) by mouth 2 (two) times daily.  60 tablet  1  . montelukast (SINGULAIR) 10 MG tablet Take 10 mg by mouth at bedtime.      . pantoprazole (PROTONIX) 40 MG tablet Take 40 mg by mouth every morning.      Marland Kitchen PARoxetine (PAXIL) 20 MG tablet Take 20 mg by mouth every morning.      . sodium chloride (OCEAN) 0.65 % SOLN nasal spray PT DOES NOT HAVE IT YET      . vitamin E 400 UNIT capsule Take 400 Units by mouth daily.      Marland Kitchen oxyCODONE (OXY IR/ROXICODONE) 5 MG immediate release tablet Take 1-2 tablets (5-10 mg total) by mouth every 4 (four) hours as needed for moderate pain.  50 tablet  0   No current facility-administered medications for this visit.    Allergies  Allergen Reactions  . Iohexol Other (See Comments)     Code: HIVES,  Desc: premedicated 2 hrs prior with 200mg  soulumedrol IV, no reaction s/p injection, Onset Date: [id number]   . Penicillins Other (See Comments)    REACTION: pruritis    Review of Systems sleeping and appetite are improving. Overall strength is improving. She takes narcotic pain medication occasionally.  BP 116/75  Pulse 68  Resp 16  Ht 5\' 8"  (1.727 m)  Wt 224 lb (101.606 kg)  BMI 34.07 kg/m2  SpO2 95% Physical Exam Alert and comfortable Lungs clear Heart rate regular without murmur Extremities warm without edema Surgical incision is well-healed   Diagnostic Tests: Chest x-rays clear-no pleural effusion or edema  Impression: Doing well one month after surgery She can start driving in light activities-weight limit his 10 pounds until August 1 She was recommended to start outpatient cardiac rehabilitation  Plan: Return in a week for final assessment

## 2013-11-24 ENCOUNTER — Other Ambulatory Visit: Payer: Self-pay | Admitting: *Deleted

## 2013-11-24 MED ORDER — ASPIRIN 325 MG PO TBEC: 325.0000 mg | DELAYED_RELEASE_TABLET | Freq: Every day | ORAL | Status: DC

## 2013-11-24 NOTE — Telephone Encounter (Signed)
Rx refill sent to patient pharmacy   

## 2013-11-27 ENCOUNTER — Telehealth: Payer: Self-pay | Admitting: Cardiovascular Disease

## 2013-11-27 NOTE — Telephone Encounter (Signed)
Pt. Stated that Dr. Benna Dunks started her on  Her cholesterol medication, so i told the pt she would need to make an appt with her but if Dr. Benna Dunks thinks we need to see her to make an appt. To see [facility]

## 2013-11-27 NOTE — Telephone Encounter (Signed)
Patient wants to know why she is on Lipitor when it can cause diabetes and she is having cramps in her left arm and under her breast.

## 2013-11-30 ENCOUNTER — Encounter (HOSPITAL_COMMUNITY): Payer: Self-pay | Admitting: Emergency Medicine

## 2013-11-30 ENCOUNTER — Emergency Department (HOSPITAL_COMMUNITY)
Admission: EM | Admit: 2013-11-30 | Discharge: 2013-11-30 | Disposition: A | Discharge status: Home / Self Care | Payer: Medicaid Other | Attending: Emergency Medicine | Admitting: Emergency Medicine

## 2013-11-30 DIAGNOSIS — Z880 Allergy status to penicillin: Secondary | ICD-10-CM | POA: Insufficient documentation

## 2013-11-30 DIAGNOSIS — F3289 Other specified depressive episodes: Secondary | ICD-10-CM | POA: Insufficient documentation

## 2013-11-30 DIAGNOSIS — I10 Essential (primary) hypertension: Secondary | ICD-10-CM | POA: Insufficient documentation

## 2013-11-30 DIAGNOSIS — Z87891 Personal history of nicotine dependence: Secondary | ICD-10-CM | POA: Insufficient documentation

## 2013-11-30 DIAGNOSIS — Z4889 Encounter for other specified surgical aftercare: Secondary | ICD-10-CM

## 2013-11-30 DIAGNOSIS — IMO0002 Reserved for concepts with insufficient information to code with codable children: Secondary | ICD-10-CM | POA: Insufficient documentation

## 2013-11-30 DIAGNOSIS — J45909 Unspecified asthma, uncomplicated: Secondary | ICD-10-CM | POA: Insufficient documentation

## 2013-11-30 DIAGNOSIS — E7800 Pure hypercholesterolemia, unspecified: Secondary | ICD-10-CM | POA: Insufficient documentation

## 2013-11-30 DIAGNOSIS — Z4801 Encounter for change or removal of surgical wound dressing: Secondary | ICD-10-CM | POA: Insufficient documentation

## 2013-11-30 DIAGNOSIS — Z7982 Long term (current) use of aspirin: Secondary | ICD-10-CM | POA: Insufficient documentation

## 2013-11-30 DIAGNOSIS — F329 Major depressive disorder, single episode, unspecified: Secondary | ICD-10-CM | POA: Insufficient documentation

## 2013-11-30 DIAGNOSIS — Z79899 Other long term (current) drug therapy: Secondary | ICD-10-CM | POA: Insufficient documentation

## 2013-11-30 DIAGNOSIS — G4733 Obstructive sleep apnea (adult) (pediatric): Secondary | ICD-10-CM | POA: Insufficient documentation

## 2013-11-30 MED ORDER — CEPHALEXIN 500 MG PO CAPS: 500.0000 mg | ORAL_CAPSULE | Freq: Four times a day (QID) | ORAL | Status: DC

## 2013-11-30 NOTE — Discharge Instructions (Signed)
Incision Care  An incision is when a surgeon cuts into your body tissues. After surgery, the incision needs to be cared for properly to prevent infection.   HOME CARE INSTRUCTIONS    Take all medicine as directed by your caregiver. Only take over-the-counter or prescription medicines for pain, discomfort, or fever as directed by your caregiver.   Do not remove your bandage (dressing) or get your incision wet until your surgeon gives you permission. In the event that your dressing becomes wet, dirty, or starts to smell, change the dressing and call your surgeon for instructions as soon as possible.   Take showers. Do not take tub baths, swim, or do anything that may soak the wound until it is healed.   Resume your normal diet and activities as directed or allowed.   Avoid lifting any weight until you are instructed otherwise.   Use anti-itch antihistamine medicine as directed by your caregiver. The wound may itch when it is healing. Do not pick or scratch at the wound.   Follow up with your caregiver for stitch (suture) or staple removal as directed.   Drink enough fluids to keep your urine clear or pale yellow.  SEEK MEDICAL CARE IF:    You have redness, swelling, or increasing pain in the wound that is not controlled with medicine.   You have drainage, blood, or pus coming from the wound that lasts longer than 1 day.   You develop muscle aches, chills, or a general ill feeling.   You notice a bad smell coming from the wound or dressing.   Your wound edges separate after the sutures, staples, or skin adhesive strips have been removed.   You develop persistent nausea or vomiting.  SEEK IMMEDIATE MEDICAL CARE IF:    You have a fever.   You develop a rash.   You develop dizzy episodes or faint while standing.   You have difficulty breathing.   You develop any reaction or side effects to medicine given.  MAKE SURE YOU:    Understand these instructions.   Will watch your condition.   Will get help  right away if you are not doing well or get worse.  Document Released: 12/16/2004 Document Revised: 08/21/2011 Document Reviewed: 10/02/2010  ExitCare Patient Information 2015 [employer]. This information is not intended to replace advice given to you by your health care provider. Make sure you discuss any questions you have with your health care provider.

## 2013-11-30 NOTE — ED Provider Notes (Addendum)
CSN: [id number]     Arrival date & time 11/30/13  0086 History   First MD Initiated Contact with Patient 11/30/13 1107     Chief Complaint  Patient presents with  . Wound Check     (Consider location/radiation/quality/duration/timing/severity/associated sxs/prior Treatment) Patient is a 58 y.o. female presenting with wound check. The history is provided by the patient.  Wound Check This is a new problem. The current episode started yesterday. The problem occurs constantly. The problem has not changed since onset.Associated symptoms comments: Noticed yesterday a small opening and clear drainage from her sternotomy incision.  Has not changed today.  No new pain, redness or other concerns.. Nothing aggravates the symptoms. Nothing relieves the symptoms. She has tried nothing for the symptoms. The treatment provided no relief.    Past Medical History  Diagnosis Date  . Asthma   . Hypertension   . Sleep apnea   . Nocturia   . Depression   . Morbid obesity   . OSA (obstructive sleep apnea)     w/ hypoventilation  . Hypercholesteremia   . Obstructive sleep apnea (adult) (pediatric) 12/27/2012    AHI 17.7 baseline, titrated to 6 cm , AHi now 2.4 - compliance    Past Surgical History  Procedure Laterality Date  . Partial hysterectomy    . Knee surgery      chronic pain and limping since  . Cystectomy    . Breast lumpectomy Right     benign at age 41  . Tonsillectomy    . Coronary artery bypass graft N/A 10/20/2013    Procedure: CORONARY ARTERY BYPASS GRAFTING (CABG);  Surgeon: Ivin Poot, MD;  Location: [facility];  Service: Open Heart Surgery;  Laterality: N/A;  CABG x 3  . Intraoperative transesophageal echocardiogram N/A 10/20/2013    Procedure: INTRAOPERATIVE TRANSESOPHAGEAL ECHOCARDIOGRAM;  Surgeon: Ivin Poot, MD;  Location: [facility];  Service: Open Heart Surgery;  Laterality: N/A;   Family History  Problem Relation Age of Onset  . Cancer Sister     double mastectomy  .  Heart attack Sister     2 sisters in their 41's with MI/stents  . Heart attack Mother 85    died of MI  . Heart attack Father 64    died of MI   History  Substance Use Topics  . Smoking status: Former Smoker -- 15 years    Types: Cigarettes  . Smokeless tobacco: Never Used     Comment: Quit 2005  . Alcohol Use: No   OB History   Grav Para Term Preterm Abortions TAB SAB Ect Mult Living                 Review of Systems  All other systems reviewed and are negative.     Allergies  Iohexol and Penicillins  Home Medications   Prior to Admission medications   Medication Sig Start Date End Date Taking? Authorizing Provider  acyclovir (ZOVIRAX) 200 MG capsule Take 200 mg by mouth every morning.    Historical Provider, MD  albuterol (PROVENTIL HFA;VENTOLIN HFA) 108 (90 BASE) MCG/ACT inhaler Inhale 2 puffs into the lungs every 6 (six) hours as needed for wheezing.     Historical Provider, MD  aspirin 325 MG EC tablet Take 1 tablet (325 mg total) by mouth daily. 11/24/13   Troy Sine, MD  atorvastatin (LIPITOR) 80 MG tablet Take 1 tablet (80 mg total) by mouth daily at 6 PM. 10/25/13   Wilder Glade  Gold, PA-C  buPROPion (WELLBUTRIN SR) 100 MG 12 hr tablet Take 100 mg by mouth 2 (two) times daily.    Historical Provider, MD  cholecalciferol (VITAMIN D) 1000 UNITS tablet Take 1,000 Units by mouth every morning.    Historical Provider, MD  ferrous [id number] C-folic acid (TRINSICON / FOLTRIN) capsule Take 1 capsule by mouth 3 (three) times daily after meals. 10/25/13   Wayne E Gold, PA-C  Fluticasone-Salmeterol (ADVAIR) 100-50 MCG/DOSE AEPB Inhale 1 puff into the lungs every 12 (twelve) hours.    Historical Provider, MD  lisinopril (PRINIVIL,ZESTRIL) 10 MG tablet Take 0.5 tablets (5 mg total) by mouth daily. 11/11/13   Tarri Fuller, PA-C  metoprolol tartrate (LOPRESSOR) 25 MG tablet Take 1 tablet (25 mg total) by mouth 2 (two) times daily. 10/25/13   Wayne E Gold, PA-C  montelukast  (SINGULAIR) 10 MG tablet Take 10 mg by mouth at bedtime.    Historical Provider, MD  oxyCODONE (OXY IR/ROXICODONE) 5 MG immediate release tablet Take 1-2 tablets (5-10 mg total) by mouth every 4 (four) hours as needed for moderate pain. 11/19/13   Ivin Poot, MD  pantoprazole (PROTONIX) 40 MG tablet Take 40 mg by mouth every morning.    Historical Provider, MD  PARoxetine (PAXIL) 20 MG tablet Take 20 mg by mouth every morning.    Historical Provider, MD  sodium chloride (OCEAN) 0.65 % SOLN nasal spray PT DOES NOT HAVE IT YET    Historical Provider, MD  vitamin E 400 UNIT capsule Take 400 Units by mouth daily.    Historical Provider, MD   BP 136/90  Pulse 72  Temp(Src) 98.2 F (36.8 C) (Oral)  Resp 18  Ht 5\' 6"  (1.676 m)  Wt 225 lb (102.059 kg)  BMI 36.33 kg/m2  SpO2 97% Physical Exam  Nursing note and vitals reviewed. Constitutional: She is oriented to person, place, and time. She appears well-developed and well-nourished. No distress.  HENT:  Head: Normocephalic and atraumatic.  Eyes: EOM are normal. Pupils are equal, round, and reactive to light.  Cardiovascular: Normal rate.   Pulmonary/Chest: Effort normal. She exhibits no tenderness.    Neurological: She is alert and oriented to person, place, and time. No cranial nerve deficit. Coordination normal.  Skin: Skin is warm and dry. No rash noted. No erythema.  Psychiatric: She has a normal mood and affect. Her behavior is normal.    ED Course  Procedures (including critical care time) Labs Review Labs Reviewed - No data to display  Imaging Review No results found.   EKG Interpretation None      MDM   Final diagnoses:  Encounter for post surgical wound check    Pt with stitch that eroded through sternotomy incision.  No surrounding erythema or purulent drainage.  Stitch removed and only small pin hole on scar.  Will use bacitracin twice a day and follow up with Dr. Evette Georges office for any problems.  Given kelfex  only to start is she starts to develop redness or change in drainage.    Blanchie Dessert, MD 11/30/13 1124  Blanchie Dessert, MD 11/30/13 1126

## 2013-11-30 NOTE — ED Notes (Signed)
Pt presents to department for evaluation of wound check. States CABG on 5/11. Now states redness and irritation to incision site. No drainage noted from site at the time. Pt is alert and oriented x4. NAD. Denies chest pain. Respirations unlabored.

## 2013-11-30 NOTE — ED Notes (Signed)
Dr. Maryan Rued at bedside; removed spit stitch, bacitracin applied to small opening. Covered with telfa and occlusive dressing.

## 2013-12-03 ENCOUNTER — Telehealth: Payer: Self-pay | Admitting: Cardiovascular Disease

## 2013-12-03 NOTE — Telephone Encounter (Signed)
Please call her on Friday when Dr Claiborne Billings is here.

## 2013-12-03 NOTE — Telephone Encounter (Signed)
RN spoke with Joann with Cardiac Rehab. Cardiac rehab orders need to be addressed regarding exercise capacity and intensity. Fax was received and given to nurse working with Dr. Claiborne Billings.

## 2013-12-03 NOTE — Telephone Encounter (Signed)
Please call her today,I thought Dr Claiborne Billings was only here Friday.

## 2013-12-03 NOTE — Telephone Encounter (Signed)
Dr. Claiborne Billings - please advise.   Cardiac Rehab orders on cart.

## 2013-12-03 NOTE — Telephone Encounter (Signed)
Please call today,concerning her Cardiac Rehab order.

## 2013-12-09 ENCOUNTER — Telehealth: Payer: Self-pay | Admitting: *Deleted

## 2013-12-09 NOTE — Telephone Encounter (Signed)
Returned  phase II cardiac rehab order form.

## 2013-12-10 NOTE — Telephone Encounter (Signed)
Stephanine faxed phase 2 cardiac rehab on 6/30

## 2013-12-15 ENCOUNTER — Telehealth: Payer: Self-pay | Admitting: Cardiovascular Disease

## 2013-12-15 NOTE — Telephone Encounter (Signed)
Wants to know the status of her Cardiac Rehab.  Was supposed to be approved by Dr Claiborne Billings.  Please call

## 2013-12-15 NOTE — Telephone Encounter (Signed)
Spoke with pt, aware we have already faxed the order back to rehab. Rehab telephone number given to pt.

## 2013-12-16 ENCOUNTER — Telehealth: Payer: Self-pay | Admitting: Cardiovascular Disease

## 2013-12-16 NOTE — Telephone Encounter (Signed)
RN informed patient.  Spoke to  [place] at cardiac rehab.  She asked if order could be re-faxed. RN spoke to [facility]- she still had copy of cardiac rehab order.  Refaxed order -Thayer Headings states she received order. Notified patient. She wanted to know when next class starts. RN stated she did not know ,but  Rehab will give the patient a call. She verbalized understanding.

## 2013-12-16 NOTE — Telephone Encounter (Signed)
Pt said she talked to Rehab this morning and said they could not find the fax.

## 2013-12-16 NOTE — Telephone Encounter (Signed)
Was on another call when RN called.  Will await a call back tomorrow.

## 2013-12-17 ENCOUNTER — Telehealth (HOSPITAL_COMMUNITY): Payer: Self-pay | Admitting: Cardiac Rehabilitation

## 2013-12-17 NOTE — Telephone Encounter (Signed)
pc to pt to inform we are awaiting completion of medicaid order form from Dr. Claiborne Billings prior to enrolling in cardiac rehab.  PC to Dr Evette Georges nurse and inbasket message sent to Dr. Claiborne Billings.  Pt verbalized understanding.

## 2013-12-22 ENCOUNTER — Other Ambulatory Visit: Payer: Self-pay

## 2013-12-22 DIAGNOSIS — I10 Essential (primary) hypertension: Secondary | ICD-10-CM

## 2013-12-22 MED ORDER — LISINOPRIL 10 MG PO TABS: 5.0000 mg | ORAL_TABLET | Freq: Every day | ORAL | Status: DC

## 2013-12-22 MED ORDER — METOPROLOL TARTRATE 25 MG PO TABS: 25.0000 mg | ORAL_TABLET | Freq: Two times a day (BID) | ORAL | Status: DC

## 2013-12-22 NOTE — Addendum Note (Signed)
Addended by: Diana Eves on: 12/22/2013 05:28 PM   Modules accepted: Orders

## 2013-12-22 NOTE — Telephone Encounter (Signed)
Rx was sent to pharmacy electronically. 

## 2013-12-29 ENCOUNTER — Telehealth: Payer: Self-pay | Admitting: Cardiovascular Disease

## 2013-12-29 NOTE — Telephone Encounter (Signed)
Spoke with cardiac rehab, aware dr Claiborne Billings has been on vacation and there is quite a lot for him to sign. Will send as soon as available. Will make Neil, dr Evette Georges cma, aware

## 2013-12-29 NOTE — Telephone Encounter (Signed)
Please call,concerning her order for Rehab.

## 2013-12-31 ENCOUNTER — Telehealth: Payer: Self-pay | Admitting: *Deleted

## 2013-12-31 NOTE — Telephone Encounter (Signed)
Ms. Amber Wong calls with concerns of a new pain that she has never experienced.  It is under her left breast and to the side.  It is an aching type pain not associated with diaphoresis or chest pain.  It lasted a few minutes, but has not recurred.  She seems a little anxious.  I suggested that if it is a recurring pain she should see her PCP and she agreed.  I felt like it was not related to her CABG of 10/20/13.

## 2014-01-06 ENCOUNTER — Telehealth: Payer: Self-pay | Admitting: Cardiovascular Disease

## 2014-01-06 NOTE — Telephone Encounter (Signed)
Patient wanted to know when she can return to work. She states she goes to vocational rehab  On Jan 12 2014  [place] -1). awaiting on order to be sign. 2) awaiting on medicaid-tosee if they will pay.  Next appointment is with Dr Claiborne Billings  Sept 8 ,2015  Patient also would like to know if she can take supplement shakes to loss weight. RN informed patient that Dr Claiborne Billings will not want her take them- change food intake and increase diet

## 2014-01-06 NOTE — Telephone Encounter (Signed)
Has questions about returning to work.

## 2014-01-12 NOTE — Telephone Encounter (Signed)
S/p CABG on 5/11; ok to resume work with limitation on lifting initially < 20 lbs. Light duty initially; then progress as tolerates.

## 2014-01-13 NOTE — Telephone Encounter (Signed)
Currently is not employeed.  Vocational rehab is planning on working with her to help get her a job.  Told that Dr. Claiborne Billings states she can resume work but not to lift >20 lbs initially and gradually increase work load as tolerated.  Voiced understanding.

## 2014-01-23 ENCOUNTER — Other Ambulatory Visit: Payer: Self-pay | Admitting: *Deleted

## 2014-01-23 ENCOUNTER — Other Ambulatory Visit: Payer: Self-pay | Admitting: Physician Assistant

## 2014-01-23 DIAGNOSIS — G4733 Obstructive sleep apnea (adult) (pediatric): Secondary | ICD-10-CM

## 2014-01-23 NOTE — Telephone Encounter (Signed)
Refilled #15 tablets with 11 refills on 12/22/2013  Requested refill too soon.

## 2014-01-26 ENCOUNTER — Telehealth: Payer: Self-pay | Admitting: Cardiovascular Disease

## 2014-01-26 NOTE — Telephone Encounter (Signed)
Ms. Chanise called in stating that she has not been contacted yet to set up cardiac rehab. Please call  Thanks

## 2014-01-26 NOTE — Telephone Encounter (Signed)
Returned call to patient I sent a message to Cardiac Rehab and told them you have not been contacted about cardiac rehab.Advised to call back if she does not get a call.

## 2014-01-27 ENCOUNTER — Telehealth: Payer: Self-pay | Admitting: *Deleted

## 2014-01-27 NOTE — Telephone Encounter (Signed)
Called Mechele Claude to discuss patient's cardiac rehab prescription. She was not there. Neither was Verdis Frederickson. I will call again later.

## 2014-01-28 ENCOUNTER — Emergency Department (HOSPITAL_COMMUNITY): Payer: Medicaid Other

## 2014-01-28 ENCOUNTER — Emergency Department (HOSPITAL_COMMUNITY)
Admission: EM | Admit: 2014-01-28 | Discharge: 2014-01-28 | Disposition: A | Discharge status: Home / Self Care | Payer: Medicaid Other | Attending: Emergency Medicine | Admitting: Emergency Medicine

## 2014-01-28 ENCOUNTER — Encounter (HOSPITAL_COMMUNITY): Payer: Self-pay | Admitting: Emergency Medicine

## 2014-01-28 DIAGNOSIS — F329 Major depressive disorder, single episode, unspecified: Secondary | ICD-10-CM | POA: Insufficient documentation

## 2014-01-28 DIAGNOSIS — J45909 Unspecified asthma, uncomplicated: Secondary | ICD-10-CM | POA: Diagnosis not present

## 2014-01-28 DIAGNOSIS — F3289 Other specified depressive episodes: Secondary | ICD-10-CM | POA: Diagnosis not present

## 2014-01-28 DIAGNOSIS — Z79899 Other long term (current) drug therapy: Secondary | ICD-10-CM | POA: Diagnosis not present

## 2014-01-28 DIAGNOSIS — E7800 Pure hypercholesterolemia, unspecified: Secondary | ICD-10-CM | POA: Insufficient documentation

## 2014-01-28 DIAGNOSIS — Z791 Long term (current) use of non-steroidal anti-inflammatories (NSAID): Secondary | ICD-10-CM | POA: Insufficient documentation

## 2014-01-28 DIAGNOSIS — R05 Cough: Secondary | ICD-10-CM | POA: Insufficient documentation

## 2014-01-28 DIAGNOSIS — Z951 Presence of aortocoronary bypass graft: Secondary | ICD-10-CM | POA: Diagnosis not present

## 2014-01-28 DIAGNOSIS — J3489 Other specified disorders of nose and nasal sinuses: Secondary | ICD-10-CM | POA: Insufficient documentation

## 2014-01-28 DIAGNOSIS — I10 Essential (primary) hypertension: Secondary | ICD-10-CM | POA: Insufficient documentation

## 2014-01-28 DIAGNOSIS — Z880 Allergy status to penicillin: Secondary | ICD-10-CM | POA: Diagnosis not present

## 2014-01-28 DIAGNOSIS — Z87891 Personal history of nicotine dependence: Secondary | ICD-10-CM | POA: Insufficient documentation

## 2014-01-28 DIAGNOSIS — Z8669 Personal history of other diseases of the nervous system and sense organs: Secondary | ICD-10-CM | POA: Insufficient documentation

## 2014-01-28 DIAGNOSIS — R059 Cough, unspecified: Secondary | ICD-10-CM | POA: Diagnosis not present

## 2014-01-28 DIAGNOSIS — Z7982 Long term (current) use of aspirin: Secondary | ICD-10-CM | POA: Diagnosis not present

## 2014-01-28 DIAGNOSIS — R079 Chest pain, unspecified: Secondary | ICD-10-CM | POA: Insufficient documentation

## 2014-01-28 LAB — CBC
HEMATOCRIT: 40.4 % (ref 36.0–46.0)
HEMOGLOBIN: 13.6 g/dL (ref 12.0–15.0)
MCH: 27.9 pg (ref 26.0–34.0)
MCHC: 33.7 g/dL (ref 30.0–36.0)
MCV: 82.8 fL (ref 78.0–100.0)
PLATELETS: 274 10*3/uL (ref 150–400)
RBC: 4.88 MIL/uL (ref 3.87–5.11)
RDW: 15.6 % — AB (ref 11.5–15.5)
WBC: 6.4 10*3/uL (ref 4.0–10.5)

## 2014-01-28 LAB — BASIC METABOLIC PANEL
ANION GAP: 15 (ref 5–15)
BUN: 10 mg/dL (ref 6–23)
CO2: 25 mEq/L (ref 19–32)
Calcium: 9.2 mg/dL (ref 8.4–10.5)
Chloride: 102 mEq/L (ref 96–112)
Creatinine, Ser: 0.71 mg/dL (ref 0.50–1.10)
GFR calc Af Amer: >90 mL/min (ref >90)
Glucose, Bld: 110 mg/dL — ABNORMAL HIGH (ref 70–99)
Potassium: 3.3 mEq/L — ABNORMAL LOW (ref 3.7–5.3)
SODIUM: 142 meq/L (ref 137–147)

## 2014-01-28 LAB — I-STAT TROPONIN, ED
TROPONIN I, POC: 0 ng/mL (ref 0.00–0.08)
Troponin i, poc: 0 ng/mL (ref 0.00–0.08)

## 2014-01-28 NOTE — ED Provider Notes (Signed)
CSN: [id number]     Arrival date & time 01/28/14  5027 History   First MD Initiated Contact with Patient 01/28/14 [phone]     Chief Complaint  Patient presents with  . Chest Pain     (Consider location/radiation/quality/duration/timing/severity/associated sxs/prior Treatment) Patient is a 58 y.o. female presenting with chest pain. The history is provided by the patient.  Chest Pain Associated symptoms: cough   Associated symptoms: no abdominal pain, no back pain, no headache, no nausea, no numbness, no shortness of breath, not vomiting and no weakness    patient presents with left-sided chest pain for a couple days. It is somewhat dull last treatment the time. She states it feels as if it goes down her left chest were no colon up to her arm. It is not on exertion, although she states that she does not do much exertion. She states she started vacuuming without difficulty. She did have a recent CABG and has not been able to start cardiac rehabilitation yet. She states she's had some mild soreness in her throat. She states she's had a little cough. She states she feels as if she is getting a cold.  Past Medical History  Diagnosis Date  . Asthma   . Hypertension   . Sleep apnea   . Nocturia   . Depression   . Morbid obesity   . OSA (obstructive sleep apnea)     w/ hypoventilation  . Hypercholesteremia   . Obstructive sleep apnea (adult) (pediatric) 12/27/2012    AHI 17.7 baseline, titrated to 6 cm , AHi now 2.4 - compliance    Past Surgical History  Procedure Laterality Date  . Partial hysterectomy    . Knee surgery      chronic pain and limping since  . Cystectomy    . Breast lumpectomy Right     benign at age 83  . Tonsillectomy    . Coronary artery bypass graft N/A 10/20/2013    Procedure: CORONARY ARTERY BYPASS GRAFTING (CABG);  Surgeon: Ivin Poot, MD;  Location: [facility];  Service: Open Heart Surgery;  Laterality: N/A;  CABG x 3  . Intraoperative transesophageal echocardiogram  N/A 10/20/2013    Procedure: INTRAOPERATIVE TRANSESOPHAGEAL ECHOCARDIOGRAM;  Surgeon: Ivin Poot, MD;  Location: [facility];  Service: Open Heart Surgery;  Laterality: N/A;   Family History  Problem Relation Age of Onset  . Cancer Sister     double mastectomy  . Heart attack Sister     2 sisters in their 46's with MI/stents  . Heart attack Mother 58    died of MI  . Heart attack Father 71    died of MI   History  Substance Use Topics  . Smoking status: Former Smoker -- 15 years    Types: Cigarettes  . Smokeless tobacco: Never Used     Comment: Quit 2005  . Alcohol Use: No   OB History   Grav Para Term Preterm Abortions TAB SAB Ect Mult Living                 Review of Systems  Constitutional: Negative for activity change and appetite change.  HENT: Positive for congestion.   Eyes: Negative for pain.  Respiratory: Positive for cough. Negative for chest tightness and shortness of breath.   Cardiovascular: Positive for chest pain. Negative for leg swelling.  Gastrointestinal: Negative for nausea, vomiting, abdominal pain and diarrhea.  Genitourinary: Negative for flank pain.  Musculoskeletal: Negative for back pain and neck  stiffness.  Skin: Negative for rash.  Neurological: Negative for weakness, numbness and headaches.  Psychiatric/Behavioral: Negative for behavioral problems.      Allergies  Iohexol; Penicillins; and Shellfish allergy  Home Medications   Prior to Admission medications   Medication Sig Start Date End Date Taking? Authorizing Provider  acyclovir (ZOVIRAX) 200 MG capsule Take 200 mg by mouth every morning.   Yes Historical Provider, MD  albuterol (PROVENTIL HFA;VENTOLIN HFA) 108 (90 BASE) MCG/ACT inhaler Inhale 2 puffs into the lungs every 6 (six) hours as needed for wheezing or shortness of breath.   Yes Historical Provider, MD  aspirin 325 MG EC tablet Take 1 tablet (325 mg total) by mouth daily. 11/24/13  Yes Troy Sine, MD  buPROPion  [facility] SR) 100 MG 12 hr tablet Take 100 mg by mouth 2 (two) times daily.   Yes Historical Provider, MD  cholecalciferol (VITAMIN D) 1000 UNITS tablet Take 1,000 Units by mouth daily.   Yes Historical Provider, MD  Fluticasone-Salmeterol (ADVAIR) 100-50 MCG/DOSE AEPB Inhale 1 puff into the lungs daily as needed (for shortness of breath).   Yes Historical Provider, MD  indomethacin (INDOCIN) 50 MG capsule Take 50 mg by mouth daily.    Yes Historical Provider, MD  Ketotifen Fumarate (ALAWAY OP) Place 1 drop into both eyes daily as needed (for dry eyes).   Yes Historical Provider, MD  lisinopril (PRINIVIL,ZESTRIL) 10 MG tablet Take 0.5 tablets (5 mg total) by mouth daily. 12/22/13  Yes Troy Sine, MD  metoprolol tartrate (LOPRESSOR) 25 MG tablet Take 1 tablet (25 mg total) by mouth 2 (two) times daily. 12/22/13  Yes Troy Sine, MD  metroNIDAZOLE (METROGEL) 0.75 % vaginal gel Place 1 Applicatorful vaginally at bedtime as needed (for PH balance).   Yes Historical Provider, MD  naphazoline-pheniramine (NAPHCON-A) 0.025-0.3 % ophthalmic solution Place 1 drop into both eyes 4 (four) times daily as needed for irritation.   Yes Historical Provider, MD  oxyCODONE (OXY IR/ROXICODONE) 5 MG immediate release tablet Take 5 mg by mouth every 4 (four) hours as needed for moderate pain.   Yes Historical Provider, MD  pantoprazole (PROTONIX) 40 MG tablet Take 40 mg by mouth every morning.   Yes Historical Provider, MD  PARoxetine (PAXIL) 20 MG tablet Take 20 mg by mouth every morning.   Yes Historical Provider, MD  vitamin E 400 UNIT capsule Take 400 Units by mouth daily.   Yes Historical Provider, MD   BP 132/77  Pulse 78  Temp(Src) 98.5 F (36.9 C) (Oral)  Resp 23  SpO2 94% Physical Exam  Nursing note and vitals reviewed. Constitutional: She is oriented to person, place, and time. She appears well-developed and well-nourished.  HENT:  Head: Normocephalic and atraumatic.  Eyes: EOM are normal.  Pupils are equal, round, and reactive to light.  Neck: Normal range of motion. Neck supple.  Cardiovascular: Normal rate, regular rhythm and normal heart sounds.   No murmur heard. Pulmonary/Chest: Effort normal and breath sounds normal. No respiratory distress. She has no wheezes. She has no rales.  Well-healed scar from CABG. Mild tenderness to left lateral chest. No large breast masses. No nipple drainage  Abdominal: Soft. Bowel sounds are normal. She exhibits no distension. There is no tenderness. There is no rebound and no guarding.  Musculoskeletal: Normal range of motion.  Neurological: She is alert and oriented to person, place, and time. No cranial nerve deficit.  Skin: Skin is warm and dry.  Psychiatric: She has a  normal mood and affect. Her speech is normal.    ED Course  Procedures (including critical care time) Labs Review Labs Reviewed  CBC - Abnormal; Notable for the following:    RDW 15.6 (*)    All other components within normal limits  BASIC METABOLIC PANEL - Abnormal; Notable for the following:    Potassium 3.3 (*)    Glucose, Bld 110 (*)    All other components within normal limits  I-STAT TROPOININ, ED  Randolm Idol, ED    Imaging Review Dg Chest 2 View  01/28/2014   CLINICAL DATA:  Chest pain and dizziness.  History of CABG.  EXAM: CHEST - 2 VIEW  COMPARISON:  11/19/2013  FINDINGS: Normal heart size and stable mediastinal contours status post CABG. There is no evidence of pulmonary edema, consolidation, pneumothorax, nodule or pleural fluid. The visualized skeletal structures are unremarkable.  IMPRESSION: No active disease.   Electronically Signed   By: Aletta Edouard M.D.   On: 01/28/2014 09:24     EKG Interpretation   Date/Time:  Wednesday January 28 2014 08:33:54 EDT Ventricular Rate:  77 PR Interval:  158 QRS Duration: 76 QT Interval:  364 QTC Calculation: 411 R Axis:   -8 Text Interpretation:  Normal sinus rhythm Possible Anterior infarct ,  age  undetermined Abnormal ECG Confirmed by Alvino Chapel  MD, Avereigh Spainhower [phone]) on  01/28/2014 8:54:22 AM      MDM   Final diagnoses:  Chest pain, unspecified chest pain type    Patient with chest pain. Left-sided. Also sore throat. Doubt cardiac cause for his recent CABG. EKG reassuring. May be early viral syndrome. Will discharge home. Enzymes negative x2.    Jasper Riling. Alvino Chapel, MD 01/28/14 1349

## 2014-01-28 NOTE — ED Notes (Signed)
Pt c/o left sided CP with radiation down left arm x 2 days; pt sts some SOB

## 2014-01-28 NOTE — ED Notes (Signed)
Meal tray ordered for patient.

## 2014-01-28 NOTE — Discharge Instructions (Signed)

## 2014-01-28 NOTE — ED Notes (Signed)
Pt informed to call when she was finished eating so we could get her blood and place her back on the BP and pulse ox.

## 2014-01-28 NOTE — ED Notes (Signed)
Pt states she needed to use the restroom. Stopped in fast track before coming to the room.

## 2014-01-28 NOTE — ED Notes (Signed)
Dr. Pickering at the bedside.  

## 2014-02-04 ENCOUNTER — Ambulatory Visit (INDEPENDENT_AMBULATORY_CARE_PROVIDER_SITE_OTHER): Payer: Medicaid Other | Admitting: Cardiothoracic Surgery

## 2014-02-04 ENCOUNTER — Encounter: Payer: Self-pay | Admitting: Cardiothoracic Surgery

## 2014-02-04 ENCOUNTER — Other Ambulatory Visit: Payer: Self-pay | Admitting: Cardiothoracic Surgery

## 2014-02-04 VITALS — BP 148/80 | HR 73 | Resp 20 | Ht 67.0 in | Wt 228.0 lb

## 2014-02-04 DIAGNOSIS — Z951 Presence of aortocoronary bypass graft: Secondary | ICD-10-CM

## 2014-02-04 DIAGNOSIS — I2510 Atherosclerotic heart disease of native coronary artery without angina pectoris: Secondary | ICD-10-CM

## 2014-02-04 NOTE — Progress Notes (Signed)
PCP is Millsaps, Luane School, NP Referring Provider is Candee Furbish, MD  Chief Complaint  Patient presents with  . F/U CARDIAC    NO CXR, CABG x3 10/20/13, Requesting referral to Cardiac Rehab    HPI: The patient presents for final postop office visit 2 months after urgent CABG x2. She admits she is been quite anxious. The patient has also been frustrated that she cannot start cardiac rehabilitation. She is been walking without difficulty. She has some residual musculoskeletal pain. She was evaluated in the ED for some chest wall tenderness with normal chest x-ray, negative cardiac enzymes x2, felt to be musculoskeletal versus viral syndrome. We will proceed with referring her to the phase II cardiac rehabilitation program at [facility]  We had a discussion about returning to [facility] finding a job. She understands that her cardiac condition is stable and she could go back to work but she has sternal restrictions on lifting and should not lift more than 20 pounds until October 1. Past Medical History  Diagnosis Date  . Asthma   . Hypertension   . Sleep apnea   . Nocturia   . Depression   . Morbid obesity   . OSA (obstructive sleep apnea)     w/ hypoventilation  . Hypercholesteremia   . Obstructive sleep apnea (adult) (pediatric) 12/27/2012    AHI 17.7 baseline, titrated to 6 cm , AHi now 2.4 - compliance     Past Surgical History  Procedure Laterality Date  . Partial hysterectomy    . Knee surgery      chronic pain and limping since  . Cystectomy    . Breast lumpectomy Right     benign at age 4  . Tonsillectomy    . Coronary artery bypass graft N/A 10/20/2013    Procedure: CORONARY ARTERY BYPASS GRAFTING (CABG);  Surgeon: Ivin Poot, MD;  Location: [facility];  Service: Open Heart Surgery;  Laterality: N/A;  CABG x 3  . Intraoperative transesophageal echocardiogram N/A 10/20/2013    Procedure: INTRAOPERATIVE TRANSESOPHAGEAL ECHOCARDIOGRAM;  Surgeon: Ivin Poot, MD;   Location: [place];  Service: Open Heart Surgery;  Laterality: N/A;    Family History  Problem Relation Age of Onset  . Cancer Sister     double mastectomy  . Heart attack Sister     2 sisters in their 92's with MI/stents  . Heart attack Mother 33    died of MI  . Heart attack Father 83    died of MI    Social History History  Substance Use Topics  . Smoking status: Former Smoker -- 15 years    Types: Cigarettes  . Smokeless tobacco: Never Used     Comment: Quit 2005  . Alcohol Use: No    Current Outpatient Prescriptions  Medication Sig Dispense Refill  . acyclovir (ZOVIRAX) 200 MG capsule Take 200 mg by mouth every morning.      Marland Kitchen aspirin 325 MG EC tablet Take 1 tablet (325 mg total) by mouth daily.  30 tablet  6  . buPROPion (WELLBUTRIN SR) 100 MG 12 hr tablet Take 100 mg by mouth 2 (two) times daily.      . indomethacin (INDOCIN) 50 MG capsule Take 50 mg by mouth daily.       Marland Kitchen Ketotifen Fumarate (ALAWAY OP) Place 1 drop into both eyes daily as needed (for dry eyes).      Marland Kitchen lisinopril (PRINIVIL,ZESTRIL) 10 MG tablet Take 0.5 tablets (5 mg total) by  mouth daily.  15 tablet  11  . metoprolol tartrate (LOPRESSOR) 25 MG tablet Take 1 tablet (25 mg total) by mouth 2 (two) times daily.  60 tablet  11  . metroNIDAZOLE (METROGEL) 0.75 % vaginal gel Place 1 Applicatorful vaginally at bedtime as needed (for PH balance).      . naphazoline-pheniramine (NAPHCON-A) 0.025-0.3 % ophthalmic solution Place 1 drop into both eyes 4 (four) times daily as needed for irritation.      . pantoprazole (PROTONIX) 40 MG tablet Take 40 mg by mouth every morning.      Marland Kitchen PARoxetine (PAXIL) 20 MG tablet Take 20 mg by mouth every morning.       No current facility-administered medications for this visit.    Allergies  Allergen Reactions  . Iohexol Other (See Comments)     Code: HIVES, Desc: premedicated 2 hrs prior with 200mg  soulumedrol IV, no reaction s/p injection, Onset Date: 16606004   .  Penicillins Other (See Comments)    REACTION: pruritis  . Shellfish Allergy Other (See Comments)    Cannot have because of gout    Review of Systems very anxious, difficulty sleeping,  divide worried about going back to work, finding a job Admits to eating some fast food-denies smoking  BP 148/80  Pulse 73  Resp 20  Ht 5\' 7"  (1.702 m)  Wt 228 lb (103.42 kg)  BMI 35.70 kg/m2  SpO2 96% Physical Exam Alert anxious Lungs clear Heart rhythm regular without murmur or gallop Sternal incision well-healed Right leg incision well-healed No pedal edema  Diagnostic Tests: Chest x-ray from at [facility] reviewed and shows normal post CABG findings  Impression: Doing well 2 months after urgent CABG Plan: Start cardiac rehabilitation-we will make referral Sternal precautions-avoid lifting more than 20 pounds until October 1 Return as needed to this office Commit to  to a heart healthy diet and regular daily exercise program

## 2014-02-17 ENCOUNTER — Ambulatory Visit: Payer: Medicaid Other | Admitting: Cardiovascular Disease

## 2014-02-24 ENCOUNTER — Telehealth (HOSPITAL_COMMUNITY): Payer: Self-pay | Admitting: Cardiac Rehabilitation

## 2014-02-24 ENCOUNTER — Telehealth: Payer: Self-pay | Admitting: Cardiovascular Disease

## 2014-02-24 NOTE — Telephone Encounter (Signed)
Returned another call to patient. Explained to her that the criteria is not the type of exercise she does. I am referring to certain high risk category requirements that medicaid requires that she has before they will reimburse the hospital. After giving her some examples that is on the sheet I again explained to her that according to Dr. Claiborne Billings she does not have any of the things listed on the sheet. Recommended that she keeps her scheduled appointment with Gaspar Bidding and he will tell her what she can or cannot do as far as returning to work. Patient agreed with this plan.

## 2014-02-24 NOTE — Telephone Encounter (Signed)
Phone call received from pt questioning when she can start cardiac rehab.  Pt advised we are waiting for medicaid order to be signed by Dr. Claiborne Billings.  Pt instructed to keep scheduled appt with Dr. Claiborne Billings for him to assess her eligibility to participate in cardiac rehab.  At pt request, heart healthy living nutrition information mailed and vocational rehab packet mailed to pt home address.  Pt also instructed to walk on her own with goal of 30 minutes daily 5-7 days per week.  Pt verbalized understanding.

## 2014-02-24 NOTE — Telephone Encounter (Signed)
Wants to know why Dr. Claiborne Billings will not signed off on the criteria for her to get into Cardiac Rehab. She asked what was the criteria which is walking and riding the bike. Wants to go back to work .Marland Kitchen Please call

## 2014-02-24 NOTE — Telephone Encounter (Signed)
Pt called in with concerns about beginning cardiac rehab.She stated that she is still having a lot of SOB . She doesn't understand why Dr.Kelly is clearing her to go work and lift 20 lbs but will not clear her for cadiac rehab. Please call  Thanks

## 2014-02-24 NOTE — Telephone Encounter (Signed)
Returned a call to patient. Informed patient that Dr. Claiborne Billings has signed the order for her to go to cardiac rehab multiple times, however medicaid has certain criteria she must meet before they will pay for the service and per Dr. Claiborne Billings she does not meet their criteria. This however does not mean that  feels that she does not need to go. The hospital is just trying to get this covered by medicaid. The patient then requested for me to call a Ms. Myers @ [employer] to see what they will require from Dr. Claiborne Billings so that she can qualify for their service to begin a job. She says that Dr. Claiborne Billings has cleared her previously to work just not to lift anything greater than 20 lbs.

## 2014-02-26 ENCOUNTER — Telehealth: Payer: Self-pay | Admitting: Physician Assistant

## 2014-02-26 ENCOUNTER — Encounter: Payer: Self-pay | Admitting: Physician Assistant

## 2014-02-27 NOTE — Telephone Encounter (Signed)
Close encounter 

## 2014-03-13 ENCOUNTER — Ambulatory Visit: Payer: Medicaid Other | Admitting: Physician Assistant

## 2014-03-18 ENCOUNTER — Ambulatory Visit (INDEPENDENT_AMBULATORY_CARE_PROVIDER_SITE_OTHER): Payer: Medicaid Other | Admitting: Physician Assistant

## 2014-03-18 ENCOUNTER — Ambulatory Visit: Payer: Medicaid Other | Admitting: Physician Assistant

## 2014-03-18 ENCOUNTER — Encounter: Payer: Self-pay | Admitting: Physician Assistant

## 2014-03-18 VITALS — BP 128/88 | HR 59 | Ht 68.0 in | Wt 225.8 lb

## 2014-03-18 DIAGNOSIS — G4733 Obstructive sleep apnea (adult) (pediatric): Secondary | ICD-10-CM

## 2014-03-18 DIAGNOSIS — I5032 Chronic diastolic (congestive) heart failure: Secondary | ICD-10-CM

## 2014-03-18 DIAGNOSIS — E785 Hyperlipidemia, unspecified: Secondary | ICD-10-CM

## 2014-03-18 DIAGNOSIS — I2510 Atherosclerotic heart disease of native coronary artery without angina pectoris: Secondary | ICD-10-CM

## 2014-03-18 DIAGNOSIS — R079 Chest pain, unspecified: Secondary | ICD-10-CM

## 2014-03-18 DIAGNOSIS — E669 Obesity, unspecified: Secondary | ICD-10-CM

## 2014-03-18 DIAGNOSIS — I10 Essential (primary) hypertension: Secondary | ICD-10-CM

## 2014-03-18 MED ORDER — ATORVASTATIN CALCIUM 20 MG PO TABS: 20.0000 mg | ORAL_TABLET | Freq: Every day | ORAL | Status: DC

## 2014-03-18 NOTE — Assessment & Plan Note (Signed)
She reported an episode of CP which has no

## 2014-03-18 NOTE — Progress Notes (Signed)
Date:  03/18/2014   ID:  Amber Wong, DOB 11-Mar-1956, MRN [id number]  PCP:  Imelda Pillow, NP  Primary Cardiologist:  Claiborne Billings    History of Present Illness: Amber Wong is a 58 y.o. female  Amber Wong is a 58 y.o. African American obese female who underwent cardiac catheterization by Dr. Georgina Peer on 10/16/2013. Results demonstrated an 80% left main stenosis and moderate proximal LAD stenosis. RCA was not significantly diseased. LVEF was normal. Because of her left main stenosis, as well as her symptoms of unstable angina she underwent a CABG x 3 on 10/20/2013(LIMA to LAD, SVG to OM, SVG to diag).  Amber Wong today for followup evaluation. She reports doing well and walking everyday.  She apparently cannot do cardiac rehabilitation because she does not qualify based on the Medicaid requirements.  She is ready to start working and asked for a note.  She is ok to lift more than 20# as of Oct 1 per Dr. Prescott Gum.  She reports an episode of CP once and no reoccurrence.  Unfortunately she is not doing enough when it comes to weight loss.   She is eating a lot of carbs.   The patient currently denies nausea, vomiting, fever, chest pain, shortness of breath, orthopnea, dizziness, PND, cough, congestion, abdominal pain, hematochezia, melena, lower extremity edema, claudication.  Wt Readings from Last 3 Encounters:  03/18/14 225 lb 12.8 oz (102.422 kg)  02/04/14 228 lb (103.42 kg)  11/30/13 225 lb (102.059 kg)     Past Medical History  Diagnosis Date  . Asthma   . Hypertension   . Sleep apnea   . Nocturia   . Depression   . Morbid obesity   . OSA (obstructive sleep apnea)     w/ hypoventilation  . Hypercholesteremia   . Obstructive sleep apnea (adult) (pediatric) 12/27/2012    AHI 17.7 baseline, titrated to 6 cm , AHi now 2.4 - compliance     Current Outpatient Prescriptions  Medication Sig Dispense Refill  . acyclovir (ZOVIRAX) 200 MG capsule Take 200  mg by mouth every morning.      Marland Kitchen albuterol (PROVENTIL HFA;VENTOLIN HFA) 108 (90 BASE) MCG/ACT inhaler Inhale 1 puff into the lungs 2 (two) times daily.      Marland Kitchen aspirin 325 MG EC tablet Take 1 tablet (325 mg total) by mouth daily.  30 tablet  6  . buPROPion (WELLBUTRIN SR) 100 MG 12 hr tablet Take 100 mg by mouth 2 (two) times daily.      . indomethacin (INDOCIN) 50 MG capsule Take 50 mg by mouth daily.       Marland Kitchen Ketotifen Fumarate (ALAWAY OP) Place 1 drop into both eyes daily as needed (for dry eyes).      Marland Kitchen lisinopril (PRINIVIL,ZESTRIL) 10 MG tablet Take 0.5 tablets (5 mg total) by mouth daily.  15 tablet  11  . metoprolol tartrate (LOPRESSOR) 25 MG tablet Take 1 tablet (25 mg total) by mouth 2 (two) times daily.  60 tablet  11  . metroNIDAZOLE (METROGEL) 0.75 % vaginal gel Place 1 Applicatorful vaginally at bedtime as needed (for PH balance).      . naphazoline-pheniramine (NAPHCON-A) 0.025-0.3 % ophthalmic solution Place 1 drop into both eyes 4 (four) times daily as needed for irritation.      . pantoprazole (PROTONIX) 40 MG tablet Take 40 mg by mouth every morning.      Marland Kitchen PARoxetine (PAXIL) 20 MG tablet Take 20 mg by mouth  every morning.      Marland Kitchen atorvastatin (LIPITOR) 20 MG tablet Take 1 tablet (20 mg total) by mouth daily.  30 tablet  6   No current facility-administered medications for this visit.    Allergies:    Allergies  Allergen Reactions  . Iohexol Other (See Comments)     Code: HIVES, Desc: premedicated 2 hrs prior with 200mg  soulumedrol IV, no reaction s/p injection, Onset Date: [id number]   . Penicillins Other (See Comments)    REACTION: pruritis  . Shellfish Allergy Other (See Comments)    Cannot have because of gout    Social History:  The patient  reports that she has quit smoking. Her smoking use included Cigarettes. She smoked 0.00 packs per day for 15 years. She has never used smokeless tobacco. She reports that she does not drink alcohol or use illicit drugs.   Family  history:   Family History  Problem Relation Age of Onset  . Cancer Sister     double mastectomy  . Heart attack Sister     2 sisters in their 72's with MI/stents  . Heart attack Mother 26    died of MI  . Heart attack Father 8    died of MI    ROS:  Please see the history of present illness.  All other systems reviewed and negative.   PHYSICAL EXAM: VS:  BP 128/88  Pulse 59  Ht 5\' 8"  (1.727 m)  Wt 225 lb 12.8 oz (102.422 kg)  BMI 34.34 kg/m2 Obese, well developed, in no acute distress HEENT: Pupils are equal round react to light accommodation extraocular movements are intact.  Neck: no JVDNo cervical lymphadenopathy. Cardiac: Regular rate and rhythm without murmurs rubs or gallops. Lungs:  clear to auscultation bilaterally, no wheezing, rhonchi or rales Abd: soft, nontender, positive bowel sounds all quadrants Ext: no lower extremity edema.  2+ radial and dorsalis pedis pulses. Skin: warm and dry Neuro:  Grossly normal   EKG:  Sinus brady 59 BPM.     ASSESSMENT AND PLAN:  Problem List Items Addressed This Visit   CAD (coronary artery disease)     PS CABG x 3 on 10/20/2013(LIMA to LAD, SVG to OM, SVG to diag).  Doing well.  No Angina.. She does not qualify for Phase II CR based on the Medicaid criteria(at least for them to pay for it).  She apparently is walking regularly.  We did a note for her to start working.      Relevant Medications      atorvastatin (LIPITOR) tablet   Chest pain - Primary     She reported an episode of CP which has no    Relevant Orders      EKG [id number]   Diastolic CHF     Grade 1 diastolic dysfunction. She appears euvolemic.  Not on a diuretic    Relevant Medications      atorvastatin (LIPITOR) tablet   Essential hypertension     Blood pressure is well controlled    Relevant Medications      atorvastatin (LIPITOR) tablet   HLD (hyperlipidemia)      Lipid Panel     Component Value Date/Time   CHOL 180 10/17/2013 2244   TRIG 98  10/17/2013 2244   HDL 52 10/17/2013 2244   CHOLHDL 3.5 10/17/2013 2244   VLDL 20 10/17/2013 2244   LDLCALC 108* 10/17/2013 2244   She stopped her lipitor because the TV told her it caused diabetes.  We will restart her on 20mg .      Relevant Medications      atorvastatin (LIPITOR) tablet   Obesity     Discussed reduction of carbohydrates.    Obstructive sleep apnea

## 2014-03-18 NOTE — Patient Instructions (Signed)
Your physician recommends that you schedule a follow-up appointment in: 3 Months with Dr Claiborne Billings  Your physician has recommended you make the following change in your medication: Start Lipitor (Atorvastation) 20 mg daily

## 2014-03-18 NOTE — Assessment & Plan Note (Signed)
Discussed reduction of carbohydrates.

## 2014-03-18 NOTE — Progress Notes (Deleted)
Date:  03/18/2014   ID:  Amber Wong, DOB 12/07/1955, MRN [id number]  PCP:  Imelda Pillow, NP  Primary Cardiologist:  Claiborne Billings    History of Present Illness: Amber Wong is a 57 y.o.  African American obese female who underwent cardiac catheterization by Dr. Georgina Peer on 10/16/2013. Results demonstrated an 80% left main stenosis and moderate proximal LAD stenosis. RCA was not significantly diseased. LVEF was normal. Because of her left main stenosis, as well as her symptoms of unstable angina, she is felt a candidate for CABG. Potential risks, complications, and benefits of the surgery were discussed and she agreed to proceed with surgery. Duplex carotid US showed no significant carotid artery stenosis bilaterally. She underwent a CABG x 3 on 10/20/2013(LIMA to LAD, SVG to OM, SVG to diag).  [name] today for followup evaluation. She reports doing well and walking everyday basis. She does feel tired after walking. She will be starting cardiac rehabilitation soon. She also states that she's been trying to lose weight. The patient currently denies nausea, vomiting, fever, chest pain, shortness of breath, orthopnea, dizziness, PND, cough, congestion, abdominal pain, hematochezia, melena, lower extremity edema  The patient currently denies nausea, vomiting, fever, chest pain, shortness of breath, orthopnea, dizziness, PND, cough, congestion, abdominal pain, hematochezia, melena, lower extremity edema, claudication.  Wt Readings from Last 3 Encounters:  03/18/14 225 lb 12.8 oz (102.422 kg)  02/04/14 228 lb (103.42 kg)  11/30/13 225 lb (102.059 kg)     Past Medical History  Diagnosis Date  . Asthma   . Hypertension   . Sleep apnea   . Nocturia   . Depression   . Morbid obesity   . OSA (obstructive sleep apnea)     w/ hypoventilation  . Hypercholesteremia   . Obstructive sleep apnea (adult) (pediatric) 12/27/2012    AHI 17.7 baseline, titrated to 6 cm , AHi now 2.4  - compliance     Current Outpatient Prescriptions  Medication Sig Dispense Refill  . acyclovir (ZOVIRAX) 200 MG capsule Take 200 mg by mouth every morning.      Marland Kitchen albuterol (PROVENTIL HFA;VENTOLIN HFA) 108 (90 BASE) MCG/ACT inhaler Inhale 1 puff into the lungs 2 (two) times daily.      Marland Kitchen aspirin 325 MG EC tablet Take 1 tablet (325 mg total) by mouth daily.  30 tablet  6  . buPROPion (WELLBUTRIN SR) 100 MG 12 hr tablet Take 100 mg by mouth 2 (two) times daily.      . indomethacin (INDOCIN) 50 MG capsule Take 50 mg by mouth daily.       Marland Kitchen Ketotifen Fumarate (ALAWAY OP) Place 1 drop into both eyes daily as needed (for dry eyes).      Marland Kitchen lisinopril (PRINIVIL,ZESTRIL) 10 MG tablet Take 0.5 tablets (5 mg total) by mouth daily.  15 tablet  11  . metoprolol tartrate (LOPRESSOR) 25 MG tablet Take 1 tablet (25 mg total) by mouth 2 (two) times daily.  60 tablet  11  . metroNIDAZOLE (METROGEL) 0.75 % vaginal gel Place 1 Applicatorful vaginally at bedtime as needed (for PH balance).      . naphazoline-pheniramine (NAPHCON-A) 0.025-0.3 % ophthalmic solution Place 1 drop into both eyes 4 (four) times daily as needed for irritation.      . pantoprazole (PROTONIX) 40 MG tablet Take 40 mg by mouth every morning.      Marland Kitchen PARoxetine (PAXIL) 20 MG tablet Take 20 mg by mouth every morning.  No current facility-administered medications for this visit.    Allergies:    Allergies  Allergen Reactions  . Iohexol Other (See Comments)     Code: HIVES, Desc: premedicated 2 hrs prior with 200mg  soulumedrol IV, no reaction s/p injection, Onset Date: [id number]   . Penicillins Other (See Comments)    REACTION: pruritis  . Shellfish Allergy Other (See Comments)    Cannot have because of gout    Social History:  The patient  reports that she has quit smoking. Her smoking use included Cigarettes. She smoked 0.00 packs per day for 15 years. She has never used smokeless tobacco. She reports that she does not drink  alcohol or use illicit drugs.   Family history:   Family History  Problem Relation Age of Onset  . Cancer Sister     double mastectomy  . Heart attack Sister     2 sisters in their 8's with MI/stents  . Heart attack Mother 3    died of MI  . Heart attack Father 28    died of MI    ROS:  Please see the history of present illness.  All other systems reviewed and negative.   PHYSICAL EXAM: VS:  Pulse 59  Ht 5\' 8"  (1.727 m)  Wt 225 lb 12.8 oz (102.422 kg)  BMI 34.34 kg/m2 Well nourished, well developed, in no acute distress HEENT: Pupils are equal round react to light accommodation extraocular movements are intact.  Neck: no JVDNo cervical lymphadenopathy. Cardiac: Regular rate and rhythm without murmurs rubs or gallops. Lungs:  clear to auscultation bilaterally, no wheezing, rhonchi or rales Abd: soft, nontender, positive bowel sounds all quadrants, no hepatosplenomegaly Ext: no lower extremity edema.  2+ radial and dorsalis pedis pulses. Skin: warm and dry Neuro:  Grossly normal  EKG:  ***     ASSESSMENT AND PLAN:  Problem List Items Addressed This Visit   None

## 2014-03-18 NOTE — Assessment & Plan Note (Signed)
Grade 1 diastolic dysfunction. She appears euvolemic.  Not on a diuretic

## 2014-03-18 NOTE — Assessment & Plan Note (Addendum)
Lipid Panel     Component Value Date/Time   CHOL 180 10/17/2013 2244   TRIG 98 10/17/2013 2244   HDL 52 10/17/2013 2244   CHOLHDL 3.5 10/17/2013 2244   VLDL 20 10/17/2013 2244   LDLCALC 108* 10/17/2013 2244   She stopped her lipitor because the TV told her it caused diabetes.  We will restart her on 20mg .

## 2014-03-18 NOTE — Assessment & Plan Note (Signed)
Lipid Panel     Component Value Date/Time   CHOL 180 10/17/2013 2244   TRIG 98 10/17/2013 2244   HDL 52 10/17/2013 2244   CHOLHDL 3.5 10/17/2013 2244   VLDL 20 10/17/2013 2244   LDLCALC 108* 10/17/2013 2244

## 2014-03-18 NOTE — Assessment & Plan Note (Signed)
Blood pressure is well controlled 

## 2014-03-18 NOTE — Assessment & Plan Note (Signed)
PS CABG x 3 on 10/20/2013(LIMA to LAD, SVG to OM, SVG to diag).  Doing well.  No Angina.. She does not qualify for Phase II CR based on the Medicaid criteria(at least for them to pay for it).  She apparently is walking regularly.  We did a note for her to start working.

## 2014-03-24 ENCOUNTER — Telehealth: Payer: Self-pay | Admitting: Cardiovascular Disease

## 2014-03-24 NOTE — Telephone Encounter (Signed)
Please call,having pain in her breast and left leg.

## 2014-03-24 NOTE — Telephone Encounter (Signed)
Returned a call to patient. She complains about having leg pain. Fears it may be a blood clot. Denies feeling warm or swelling. Just pain. Has history of "cartledge removal" in the leg. Nurse at PCP told her she may have blood clot. Appointment  Given for evaluation tomorrow with Mickel Baas.

## 2014-03-25 ENCOUNTER — Ambulatory Visit (HOSPITAL_COMMUNITY)
Admission: RE | Admit: 2014-03-25 | Discharge: 2014-03-25 | Disposition: A | Discharge status: Home / Self Care | Payer: Medicaid Other | Source: Ambulatory Visit | Attending: Cardiology | Admitting: Cardiology

## 2014-03-25 ENCOUNTER — Ambulatory Visit (INDEPENDENT_AMBULATORY_CARE_PROVIDER_SITE_OTHER): Payer: Medicaid Other | Admitting: Cardiology

## 2014-03-25 ENCOUNTER — Encounter: Payer: Self-pay | Admitting: Cardiology

## 2014-03-25 VITALS — BP 157/94 | HR 72 | Ht 68.0 in | Wt 224.7 lb

## 2014-03-25 DIAGNOSIS — M79605 Pain in left leg: Secondary | ICD-10-CM | POA: Insufficient documentation

## 2014-03-25 DIAGNOSIS — M7989 Other specified soft tissue disorders: Secondary | ICD-10-CM | POA: Diagnosis present

## 2014-03-25 DIAGNOSIS — M10062 Idiopathic gout, left knee: Secondary | ICD-10-CM

## 2014-03-25 DIAGNOSIS — I2510 Atherosclerotic heart disease of native coronary artery without angina pectoris: Secondary | ICD-10-CM

## 2014-03-25 DIAGNOSIS — M109 Gout, unspecified: Secondary | ICD-10-CM | POA: Insufficient documentation

## 2014-03-25 MED ORDER — ALLOPURINOL 100 MG PO TABS
100.0000 mg | ORAL_TABLET | Freq: Every day | ORAL | Status: DC
Start: 2014-03-25 — End: 2014-06-15

## 2014-03-25 NOTE — Patient Instructions (Signed)
Your physician recommends that you schedule a follow-up appointment in: 3 Months with Dr Claiborne Billings

## 2014-03-25 NOTE — Assessment & Plan Note (Signed)
I have asked her to take the indocin after meals 3 X day for 5 days, I have added allopurinol  To her meds to prevent gout

## 2014-03-25 NOTE — Progress Notes (Signed)
Lower Extremity Venous Duplex Completed. °Brianna L Mazza,RVT °

## 2014-03-25 NOTE — Progress Notes (Signed)
03/25/2014   PCP: Imelda Pillow, NP   Chief Complaint  Patient presents with  . Follow-up    pt c/o left leg pain    Primary Cardiologist: Dr. Corky Downs   HPI: Amber Wong is a 58 y.o. African American obese female who underwent cardiac catheterization by Dr. Georgina Peer on 10/16/2013. Results demonstrated an 80% left main stenosis and moderate proximal LAD stenosis. RCA was not significantly diseased. LVEF was normal. Because of her left main stenosis, as well as her symptoms of unstable angina she underwent a CABG x 3 on 10/20/2013(LIMA to LAD, SVG to OM, SVG to diag).  .  She presents today for Lt leg pain, Lt knee and lt great toe.  She is not taking her indocin due to stomach upset.  She is on medicaid and the cost of colchicine is prohibitive.  She can hardly walk due to the knee pain that is into upper calf.  She has had hx of baker's cyst.   She was able to walk in today,  But she is concerned that this is a large problem, tylenol did help the knee pain.    Allergies  Allergen Reactions  . Iohexol Other (See Comments)     Code: HIVES, Desc: premedicated 2 hrs prior with 200mg  soulumedrol IV, no reaction s/p injection, Onset Date: [id number]   . Penicillins Other (See Comments)    REACTION: pruritis  . Shellfish Allergy Other (See Comments)    Cannot have because of gout    Current Outpatient Prescriptions  Medication Sig Dispense Refill  . acyclovir (ZOVIRAX) 200 MG capsule Take 200 mg by mouth every morning.      Marland Kitchen albuterol (PROVENTIL HFA;VENTOLIN HFA) 108 (90 BASE) MCG/ACT inhaler Inhale 1 puff into the lungs 2 (two) times daily.      Marland Kitchen aspirin 325 MG EC tablet Take 1 tablet (325 mg total) by mouth daily.  30 tablet  6  . atorvastatin (LIPITOR) 20 MG tablet Take 1 tablet (20 mg total) by mouth daily.  30 tablet  6  . buPROPion (WELLBUTRIN SR) 100 MG 12 hr tablet Take 100 mg by mouth 2 (two) times daily.      . indomethacin (INDOCIN) 50 MG  capsule Take 50 mg by mouth daily.       Marland Kitchen Ketotifen Fumarate (ALAWAY OP) Place 1 drop into both eyes daily as needed (for dry eyes).      Marland Kitchen lisinopril (PRINIVIL,ZESTRIL) 10 MG tablet Take 0.5 tablets (5 mg total) by mouth daily.  15 tablet  11  . metoprolol tartrate (LOPRESSOR) 25 MG tablet Take 1 tablet (25 mg total) by mouth 2 (two) times daily.  60 tablet  11  . metroNIDAZOLE (METROGEL) 0.75 % vaginal gel Place 1 Applicatorful vaginally at bedtime as needed (for PH balance).      . naphazoline-pheniramine (NAPHCON-A) 0.025-0.3 % ophthalmic solution Place 1 drop into both eyes 4 (four) times daily as needed for irritation.      . pantoprazole (PROTONIX) 40 MG tablet Take 40 mg by mouth every morning.      Marland Kitchen PARoxetine (PAXIL) 20 MG tablet Take 20 mg by mouth every morning.      Marland Kitchen allopurinol (ZYLOPRIM) 100 MG tablet Take 1 tablet (100 mg total) by mouth daily.  30 tablet  6   No current facility-administered medications for this visit.    Past Medical History  Diagnosis Date  . Asthma   .  Hypertension   . Sleep apnea   . Nocturia   . Depression   . Morbid obesity   . OSA (obstructive sleep apnea)     w/ hypoventilation  . Hypercholesteremia   . Obstructive sleep apnea (adult) (pediatric) 12/27/2012    AHI 17.7 baseline, titrated to 6 cm , AHi now 2.4 - compliance     Past Surgical History  Procedure Laterality Date  . Partial hysterectomy    . Knee surgery      chronic pain and limping since  . Cystectomy    . Breast lumpectomy Right     benign at age 38  . Tonsillectomy    . Coronary artery bypass graft N/A 10/20/2013    Procedure: CORONARY ARTERY BYPASS GRAFTING (CABG);  Surgeon: Ivin Poot, MD;  Location: [facility];  Service: Open Heart Surgery;  Laterality: N/A;  CABG x 3  . Intraoperative transesophageal echocardiogram N/A 10/20/2013    Procedure: INTRAOPERATIVE TRANSESOPHAGEAL ECHOCARDIOGRAM;  Surgeon: Ivin Poot, MD;  Location: [place];  Service: Open Heart Surgery;   Laterality: N/A;    EGB:TDVVOHY:WV colds or fevers, no weight changes Skin:no rashes or ulcers HEENT:no blurred vision, no congestion CV:see HPI PUL:see HPI GI:no diarrhea constipation or melena, no indigestion GU:no hematuria, no dysuria MS:+ lt knee and calf pain and lt great toe., no claudication Neuro:no syncope, no lightheadedness Endo:no diabetes, no thyroid disease  Wt Readings from Last 3 Encounters:  03/25/14 224 lb 11.2 oz (101.923 kg)  03/18/14 225 lb 12.8 oz (102.422 kg)  02/04/14 228 lb (103.42 kg)    PHYSICAL EXAM BP 157/94  Pulse 72  Ht 5\' 8"  (1.727 m)  Wt 224 lb 11.2 oz (101.923 kg)  BMI 34.17 kg/m2 General:Pleasant affect, NAD Skin:Warm and dry, brisk capillary refill HEENT:normocephalic, sclera clear, mucus membranes moist Heart:S1S2 RRR without murmur, gallup, rub or click Lungs:clear without rales, rhonchi, or wheezes PXT:GGYI, non tender, + BS, do not palpate liver spleen or masses Ext:no lower ext edema except mild at lt knee, 2+ pedal pulses, 2+ radial pulses, Lt gt toe tender to touch but not erythremic  . Neuro:alert and oriented X 3, MAE, follows commands, + facial symmetry Psych: increased anxiety  RSW:NIOE  ASSESSMENT AND PLAN Left leg pain She complains of knee pain and into upper lt calf, she also complains of Lt great toe pain.  Will check venous doppler of Lt leg rule out DVT or Baker's cyst.  Gout- Lt great toe I have asked her to take the indocin after meals 3 X day for 5 days, I have added allopurinol  To her meds to prevent gout  CAD (coronary artery disease) Hx CABG, stable no chest pain.   The venous study was negative for DVT or Baker's cyst.  She will need follow up with Dr. Corky Downs in 3 months

## 2014-03-25 NOTE — Assessment & Plan Note (Signed)
Hx CABG, stable no chest pain.

## 2014-03-25 NOTE — Assessment & Plan Note (Signed)
She complains of knee pain and into upper lt calf, she also complains of Lt great toe pain.  Will check venous doppler of Lt leg rule out DVT or Baker's cyst.

## 2014-04-01 ENCOUNTER — Ambulatory Visit: Payer: Medicaid Other | Admitting: Cardiovascular Disease

## 2014-04-02 ENCOUNTER — Telehealth: Payer: Self-pay | Admitting: *Deleted

## 2014-04-02 NOTE — Telephone Encounter (Signed)
Returned cardiac phase II cardiac rehab prescription.

## 2014-04-09 ENCOUNTER — Other Ambulatory Visit: Payer: Self-pay | Admitting: *Deleted

## 2014-04-09 DIAGNOSIS — N644 Mastodynia: Secondary | ICD-10-CM

## 2014-04-17 ENCOUNTER — Telehealth: Payer: Self-pay | Admitting: Cardiovascular Disease

## 2014-04-17 NOTE — Telephone Encounter (Signed)
Returned call to patient. She states she was told by PCP that her spur may have came from some trauma and she was concerned about this and didn't recall trauma to her leg during her heart surgery in May. She states Mickel Baas, NP rx'ed allopurinol for gout along with indocin and I advised her it could be related to gout, and she should contact PCP to have lab tests done, as allopurinol dose may be increased or the labs could rule out if her leg and foot pain is from gout or not. She voiced understanding.

## 2014-04-17 NOTE — Telephone Encounter (Signed)
Returning your call. °

## 2014-04-17 NOTE — Telephone Encounter (Signed)
Patient states that she had open heart surgery in May of this year.  Sh states she started having pain in her left foot in July.  She was here recently and had an ultrasound that was negative.  She states she saw her PCP last week and had a w-ray of her L. Foot that showed a spur.  She is wondering if this could be a result of something that happened during her surgery in May.

## 2014-04-17 NOTE — Telephone Encounter (Signed)
LMTCB

## 2014-04-21 ENCOUNTER — Ambulatory Visit
Admission: RE | Admit: 2014-04-21 | Discharge: 2014-04-21 | Disposition: A | Discharge status: Home / Self Care | Payer: Medicaid Other | Source: Ambulatory Visit | Attending: *Deleted | Admitting: *Deleted

## 2014-04-21 DIAGNOSIS — N644 Mastodynia: Secondary | ICD-10-CM

## 2014-05-21 ENCOUNTER — Encounter (HOSPITAL_COMMUNITY): Payer: Self-pay | Admitting: Cardiovascular Disease

## 2014-06-15 ENCOUNTER — Other Ambulatory Visit: Payer: Self-pay | Admitting: Cardiology

## 2014-06-15 ENCOUNTER — Other Ambulatory Visit: Payer: Self-pay | Admitting: Cardiovascular Disease

## 2014-06-17 NOTE — Telephone Encounter (Signed)
agree

## 2014-06-18 NOTE — Telephone Encounter (Signed)
Rx(s) sent to pharmacy electronically.  

## 2014-06-26 ENCOUNTER — Encounter (HOSPITAL_COMMUNITY): Payer: Self-pay | Admitting: Emergency Medicine

## 2014-06-26 ENCOUNTER — Emergency Department (HOSPITAL_COMMUNITY): Payer: Medicaid Other

## 2014-06-26 ENCOUNTER — Emergency Department (HOSPITAL_COMMUNITY)
Admission: EM | Admit: 2014-06-26 | Discharge: 2014-06-26 | Disposition: A | Discharge status: Home / Self Care | Payer: Medicaid Other | Attending: Emergency Medicine | Admitting: Emergency Medicine

## 2014-06-26 DIAGNOSIS — F329 Major depressive disorder, single episode, unspecified: Secondary | ICD-10-CM | POA: Diagnosis not present

## 2014-06-26 DIAGNOSIS — I10 Essential (primary) hypertension: Secondary | ICD-10-CM | POA: Diagnosis not present

## 2014-06-26 DIAGNOSIS — Z7982 Long term (current) use of aspirin: Secondary | ICD-10-CM | POA: Insufficient documentation

## 2014-06-26 DIAGNOSIS — Z8669 Personal history of other diseases of the nervous system and sense organs: Secondary | ICD-10-CM | POA: Diagnosis not present

## 2014-06-26 DIAGNOSIS — H9210 Otorrhea, unspecified ear: Secondary | ICD-10-CM | POA: Insufficient documentation

## 2014-06-26 DIAGNOSIS — R079 Chest pain, unspecified: Secondary | ICD-10-CM | POA: Diagnosis present

## 2014-06-26 DIAGNOSIS — Z880 Allergy status to penicillin: Secondary | ICD-10-CM | POA: Insufficient documentation

## 2014-06-26 DIAGNOSIS — E785 Hyperlipidemia, unspecified: Secondary | ICD-10-CM | POA: Insufficient documentation

## 2014-06-26 DIAGNOSIS — M542 Cervicalgia: Secondary | ICD-10-CM | POA: Insufficient documentation

## 2014-06-26 DIAGNOSIS — Z87891 Personal history of nicotine dependence: Secondary | ICD-10-CM | POA: Insufficient documentation

## 2014-06-26 DIAGNOSIS — Z79899 Other long term (current) drug therapy: Secondary | ICD-10-CM | POA: Insufficient documentation

## 2014-06-26 DIAGNOSIS — J45909 Unspecified asthma, uncomplicated: Secondary | ICD-10-CM | POA: Diagnosis not present

## 2014-06-26 LAB — CBC
HEMATOCRIT: 43 % (ref 36.0–46.0)
Hemoglobin: 14.5 g/dL (ref 12.0–15.0)
MCH: 30 pg (ref 26.0–34.0)
MCHC: 33.7 g/dL (ref 30.0–36.0)
MCV: 89 fL (ref 78.0–100.0)
PLATELETS: 281 10*3/uL (ref 150–400)
RBC: 4.83 MIL/uL (ref 3.87–5.11)
RDW: 14 % (ref 11.5–15.5)
WBC: 8 10*3/uL (ref 4.0–10.5)

## 2014-06-26 LAB — I-STAT TROPONIN, ED
Troponin i, poc: 0 ng/mL (ref 0.00–0.08)
Troponin i, poc: 0 ng/mL (ref 0.00–0.08)

## 2014-06-26 LAB — URINALYSIS, ROUTINE W REFLEX MICROSCOPIC
Bilirubin Urine: NEGATIVE
GLUCOSE, UA: NEGATIVE mg/dL
Ketones, ur: NEGATIVE mg/dL
NITRITE: NEGATIVE
PH: 6 (ref 5.0–8.0)
Protein, ur: NEGATIVE mg/dL
Specific Gravity, Urine: 1.015 (ref 1.005–1.030)
Urobilinogen, UA: 0.2 mg/dL (ref 0.0–1.0)

## 2014-06-26 LAB — BASIC METABOLIC PANEL
Anion gap: 10 (ref 5–15)
BUN: 9 mg/dL (ref 6–23)
CO2: 27 mmol/L (ref 19–32)
Calcium: 9 mg/dL (ref 8.4–10.5)
Chloride: 101 mEq/L (ref 96–112)
Creatinine, Ser: 0.79 mg/dL (ref 0.50–1.10)
GFR calc Af Amer: >90 mL/min (ref >90)
GFR calc non Af Amer: >90 mL/min (ref >90)
Glucose, Bld: 93 mg/dL (ref 70–99)
Potassium: 3 mmol/L — ABNORMAL LOW (ref 3.5–5.1)
Sodium: 138 mmol/L (ref 135–145)

## 2014-06-26 LAB — URINE MICROSCOPIC-ADD ON

## 2014-06-26 NOTE — Discharge Instructions (Signed)

## 2014-06-26 NOTE — ED Notes (Signed)
Pt states she had open heart surgery in May   Pt states since then she has been having pain in her left arm  Pt states these pains come and go  Pt states today the pain ran into her back and up into her chest  Pt states for the past few days she has had some light headedness

## 2014-06-26 NOTE — ED Provider Notes (Signed)
CSN: [id number]     Arrival date & time 06/26/14  0302 History   First MD Initiated Contact with Patient 06/26/14 [phone]     Chief Complaint  Patient presents with  . Arm Pain  . Chest Pain     (Consider location/radiation/quality/duration/timing/severity/associated sxs/prior Treatment) HPI Comments: Pt presents to ED c/o L sided CP with radiation through to the back and into L arm and neck. She has been having a recurring L arm pain that she states has been an ongoing issue that may be related to her cholesterol medication per her MD. At 0100 today she laid down to go to sleep and began having L chest pain radiating through to the back. The pain is intermittent lasting approx 37min and resolves without intervention. Pain worsens and is reproducible with cough. She describes the pain as a pressure in her chest and more stabbing in nature in the back. The pain is 7/10 at worst, though she is pain free throughout exam. She states the pain "is different but the same" as the pain she experienced prior to CABG last year. When asked why she came to the ED she states "I thought I was having a heart attack. Her history is also significant for URI symptoms, cough and rhinorrhea, x2d.  Patient is a 59 y.o. female presenting with arm pain and chest pain. The history is provided by the patient. No language interpreter was used.  Arm Pain The problem occurs intermittently. Associated symptoms include chest pain, coughing and neck pain. Pertinent negatives include no abdominal pain, chills, congestion, diaphoresis, fever, myalgias, sore throat or vomiting. The symptoms are aggravated by coughing. She has tried nothing for the symptoms.  Chest Pain Associated symptoms: cough   Associated symptoms: no abdominal pain, no diaphoresis, no dizziness, no dysphagia, no fever, no shortness of breath and not vomiting     Past Medical History  Diagnosis Date  . Asthma   . Hypertension   . Sleep apnea   . Nocturia   .  Depression   . Morbid obesity   . OSA (obstructive sleep apnea)     w/ hypoventilation  . Hypercholesteremia   . Obstructive sleep apnea (adult) (pediatric) 12/27/2012    AHI 17.7 baseline, titrated to 6 cm , AHi now 2.4 - compliance    Past Surgical History  Procedure Laterality Date  . Partial hysterectomy    . Knee surgery      chronic pain and limping since  . Cystectomy    . Breast lumpectomy Right     benign at age 21  . Tonsillectomy    . Coronary artery bypass graft N/A 10/20/2013    Procedure: CORONARY ARTERY BYPASS GRAFTING (CABG);  Surgeon: Ivin Poot, MD;  Location: [facility];  Service: Open Heart Surgery;  Laterality: N/A;  CABG x 3  . Intraoperative transesophageal echocardiogram N/A 10/20/2013    Procedure: INTRAOPERATIVE TRANSESOPHAGEAL ECHOCARDIOGRAM;  Surgeon: Ivin Poot, MD;  Location: [facility];  Service: Open Heart Surgery;  Laterality: N/A;  . Left heart catheterization with coronary angiogram N/A 10/16/2013    Procedure: LEFT HEART CATHETERIZATION WITH CORONARY ANGIOGRAM;  Surgeon: Troy Sine, MD;  Location: [facility];  Service: Cardiovascular;  Laterality: N/A;   Family History  Problem Relation Age of Onset  . Cancer Sister     double mastectomy  . Heart attack Sister     2 sisters in their 24's with MI/stents  . Heart attack Mother 79  died of MI  . Heart attack Father 76    died of MI   History  Substance Use Topics  . Smoking status: Former Smoker -- 15 years    Types: Cigarettes  . Smokeless tobacco: Never Used     Comment: Quit 2005  . Alcohol Use: No   OB History    No data available     Review of Systems  Constitutional: Negative for fever, chills and diaphoresis.  HENT: Positive for ear discharge and rhinorrhea. Negative for congestion, sinus pressure, sore throat and trouble swallowing.   Respiratory: Positive for cough. Negative for shortness of breath.   Cardiovascular: Positive for chest pain. Negative for leg swelling.   Gastrointestinal: Negative for vomiting, abdominal pain and diarrhea.  Genitourinary: Negative.  Negative for dysuria and frequency.  Musculoskeletal: Positive for neck pain. Negative for myalgias.  Skin: Negative for color change.  Neurological: Negative for dizziness, syncope and light-headedness.      Allergies  Iohexol; Penicillins; and Shellfish allergy  Home Medications   Prior to Admission medications   Medication Sig Start Date End Date Taking? Authorizing Provider  acyclovir (ZOVIRAX) 200 MG capsule Take 200 mg by mouth every morning.   Yes Historical Provider, MD  albuterol (PROVENTIL HFA;VENTOLIN HFA) 108 (90 BASE) MCG/ACT inhaler Inhale 1 puff into the lungs 2 (two) times daily.   Yes Historical Provider, MD  allopurinol (ZYLOPRIM) 100 MG tablet TAKE 1 TABLET (100 MG TOTAL) BY MOUTH DAILY. 06/18/14  Yes Isaiah Serge, NP  aspirin 81 MG tablet Take 81 mg by mouth once.   Yes Historical Provider, MD  aspirin EC 325 MG tablet TAKE 1 TABLET (325 MG TOTAL) BY MOUTH DAILY. 06/15/14  Yes Troy Sine, MD  atorvastatin (LIPITOR) 20 MG tablet Take 1 tablet (20 mg total) by mouth daily. 03/18/14  Yes Brett Canales, PA-C  buPROPion (WELLBUTRIN SR) 100 MG 12 hr tablet Take 100 mg by mouth 2 (two) times daily.   Yes Historical Provider, MD  indomethacin (INDOCIN) 50 MG capsule Take 50 mg by mouth daily.    Yes Historical Provider, MD  Ketotifen Fumarate (ALAWAY OP) Place 1 drop into both eyes daily as needed (for dry eyes).   Yes Historical Provider, MD  lisinopril (PRINIVIL,ZESTRIL) 10 MG tablet TAKE 0.5 TABLETS (5 MG TOTAL) BY MOUTH DAILY. 06/15/14  Yes Troy Sine, MD  Menthol, Topical Analgesic, (BENGAY EX) Apply 1 application topically daily.   Yes Historical Provider, MD  metoprolol tartrate (LOPRESSOR) 25 MG tablet Take 1 tablet (25 mg total) by mouth 2 (two) times daily. 12/22/13  Yes Troy Sine, MD  naphazoline-pheniramine (NAPHCON-A) 0.025-0.3 % ophthalmic solution Place 1  drop into both eyes 4 (four) times daily as needed for irritation.   Yes Historical Provider, MD  pantoprazole (PROTONIX) 40 MG tablet Take 40 mg by mouth every morning.   Yes Historical Provider, MD  PARoxetine (PAXIL) 20 MG tablet Take 20 mg by mouth every morning.   Yes Historical Provider, MD  Prenatal Vit-Fe Fumarate-FA (PRENATAL MULTIVITAMIN) TABS tablet Take 1 tablet by mouth daily at 12 noon.   Yes Historical Provider, MD  triamcinolone cream (KENALOG) 0.1 % Apply 1 application topically 2 (two) times daily.   Yes Historical Provider, MD  metoprolol tartrate (LOPRESSOR) 25 MG tablet TAKE 1 TABLET BY MOUTH TWICE A DAY Patient not taking: Reported on 06/26/2014 06/15/14   Troy Sine, MD  metroNIDAZOLE (METROGEL) 0.75 % vaginal gel Place 1 Applicatorful vaginally  at bedtime as needed (for PH balance).    Historical Provider, MD   BP 140/87 mmHg  Pulse 71  Temp(Src) 98.1 F (36.7 C) (Oral)  Resp 20  SpO2 96% Physical Exam  Constitutional: She is oriented to person, place, and time. She appears well-developed and well-nourished. No distress.  HENT:  Head: Normocephalic and atraumatic.  Right Ear: Tympanic membrane normal.  Left Ear: Tympanic membrane normal.  Eyes: Conjunctivae and EOM are normal. Pupils are equal, round, and reactive to light.  Neck: Normal range of motion. Neck supple.  Cardiovascular: Normal rate and regular rhythm.   No murmur heard. Pulmonary/Chest: Effort normal.  CTA b/l, no wheezing, rales, or rhonci noted  Abdominal: Soft. There is no tenderness.  Soft, non-tender to palp x4  Musculoskeletal: Normal range of motion.  Lymphadenopathy:    She has no cervical adenopathy.  Neurological: She is alert and oriented to person, place, and time.  CN III-XII grossly intact  Skin: Skin is warm and dry.  Psychiatric: She has a normal mood and affect.    ED Course  Procedures (including critical care time) Labs Review Labs Reviewed  BASIC METABOLIC PANEL -  Abnormal; Notable for the following:    Potassium 3.0 (*)    All other components within normal limits  CBC  URINALYSIS, ROUTINE W REFLEX MICROSCOPIC  I-STAT TROPOININ, ED   Results for orders placed or performed during the hospital encounter of 06/26/14  CBC  Result Value Ref Range   WBC 8.0 4.0 - 10.5 K/uL   RBC 4.83 3.87 - 5.11 MIL/uL   Hemoglobin 14.5 12.0 - 15.0 g/dL   HCT 43.0 36.0 - 46.0 %   MCV 89.0 78.0 - 100.0 fL   MCH 30.0 26.0 - 34.0 pg   MCHC 33.7 30.0 - 36.0 g/dL   RDW 14.0 11.5 - 15.5 %   Platelets 281 150 - 400 K/uL  Basic metabolic panel  Result Value Ref Range   Sodium 138 135 - 145 mmol/L   Potassium 3.0 (L) 3.5 - 5.1 mmol/L   Chloride 101 96 - 112 mEq/L   CO2 27 19 - 32 mmol/L   Glucose, Bld 93 70 - 99 mg/dL   BUN 9 6 - 23 mg/dL   Creatinine, Ser 0.79 0.50 - 1.10 mg/dL   Calcium 9.0 8.4 - 10.5 mg/dL   GFR calc non Af Amer >90 >90 mL/min   GFR calc Af Amer >90 >90 mL/min   Anion gap 10 5 - 15  Urinalysis, Routine w reflex microscopic  Result Value Ref Range   Color, Urine YELLOW YELLOW   APPearance CLOUDY (A) CLEAR   Specific Gravity, Urine 1.015 1.005 - 1.030   pH 6.0 5.0 - 8.0   Glucose, UA NEGATIVE NEGATIVE mg/dL   Hgb urine dipstick MODERATE (A) NEGATIVE   Bilirubin Urine NEGATIVE NEGATIVE   Ketones, ur NEGATIVE NEGATIVE mg/dL   Protein, ur NEGATIVE NEGATIVE mg/dL   Urobilinogen, UA 0.2 0.0 - 1.0 mg/dL   Nitrite NEGATIVE NEGATIVE   Leukocytes, UA TRACE (A) NEGATIVE  Urine microscopic-add on  Result Value Ref Range   Squamous Epithelial / LPF FEW (A) RARE   WBC, UA 3-6 <3 WBC/hpf   RBC / HPF 3-6 <3 RBC/hpf   Bacteria, UA MANY (A) RARE   Crystals CA OXALATE CRYSTALS (A) NEGATIVE  I-stat troponin, ED (not at [facility])  Result Value Ref Range   Troponin i, poc 0.00 0.00 - 0.08 ng/mL   Comment 3  Imaging Review No results found.   EKG Interpretation   Date/Time:  Friday June 26 2014 03:19:13 EST Ventricular Rate:  68 PR  Interval:  171 QRS Duration: 77 QT Interval:  409 QTC Calculation: 435 R Axis:   -8 Text Interpretation:  Sinus rhythm Borderline abnrm T, anterolateral leads  Baseline wander in lead(s) III V6 No significant change since last tracing  Confirmed by OTTER  MD, OLGA ([id number]) on 06/26/2014 3:22:46 AM      MDM   Final diagnoses:  None   1. Chest pain  She is pain free in ED, after having intermittent chest, left arm and neck discomfort. She is a poor historian, unable to provide reliable details. Feels this may be anginal pain. Will obtain delta troponin (first set negative) and reassess but anticipate discharge home.     Dewaine Oats, PA-C 06/26/14 2052  Kalman Drape, MD 07/06/14 Einar Crow

## 2014-07-16 ENCOUNTER — Telehealth: Payer: Self-pay | Admitting: Cardiovascular Disease

## 2014-07-16 NOTE — Telephone Encounter (Signed)
Returned call to patient she stated she has been feeling bad.Stated she went to ER last week with chest pain,left arm pain.Stated was told she was ok.Stated she is having frequent headaches,nausea,sob.Stated no chest pain at present.Stated she would like to see Dr.Kelly sooner than 07/31/14.Appointment scheduled with [facility] 07/20/14 at 4:15 pm.

## 2014-07-16 NOTE — Telephone Encounter (Signed)
Patient states that she had heart bypass surgery in May and is not due to see Dr. Claiborne Billings for a while.  She states she has been having headaches and some of the same symptoms she had before she had her surgery.  Please call.

## 2014-07-20 ENCOUNTER — Ambulatory Visit (INDEPENDENT_AMBULATORY_CARE_PROVIDER_SITE_OTHER): Payer: Medicaid Other | Admitting: Cardiovascular Disease

## 2014-07-20 ENCOUNTER — Encounter: Payer: Self-pay | Admitting: Cardiovascular Disease

## 2014-07-20 VITALS — BP 124/84 | HR 65 | Ht 67.0 in | Wt 232.8 lb

## 2014-07-20 DIAGNOSIS — I2510 Atherosclerotic heart disease of native coronary artery without angina pectoris: Secondary | ICD-10-CM

## 2014-07-20 DIAGNOSIS — E785 Hyperlipidemia, unspecified: Secondary | ICD-10-CM

## 2014-07-20 DIAGNOSIS — Z79899 Other long term (current) drug therapy: Secondary | ICD-10-CM

## 2014-07-20 NOTE — Patient Instructions (Signed)
Your physician recommends that you return for lab work in: fasting.   Your physician wants you to follow-up in: 6 months or sooner if needed with Dr. Claiborne Billings. You will receive a reminder letter in the mail two months in advance. If you don't receive a letter, please call our office to schedule the follow-up appointment.

## 2014-07-20 NOTE — Progress Notes (Signed)
Patient ID: Amber Wong, female   DOB: 1955/07/05, 59 y.o.   MRN: [id number]     HPI: Amber Wong is a 59 y.o. female who presents to the office today for a cardiology evaluation.  I have not seen her since her hospitalization in May 2015.  Amber Wong has a history of morbid obesity, hypertension, hyperlipidemia, and strong family history for CAD.  She was admitted to [facility] hospital in May 2015 with chest pain described as a "elephant "sitting on her chest.  I performed cardiac catheterization on 10/16/2013 and she was found to have hyperdynamic LV function and evidence for severe 80% distal left main stenosis with 50% diffuse proximal LAD stenosis and otherwise normal circumflex and RCA vessels.  She underwent CABG x3 revascularization surgery by Dr. Myles Lipps and had a LIMA placed to her LAD, SVG to obtuse marginal, and SVG to her diagonal vessel with Endo vascular vein harvesting from her right thigh.  She never participated in cardiac rehabilitation.  She was seen by Tarri Fuller in October 2015 with complaints involving her left leg, knee, and great toe.  Presently, she notes marked improvement in her previous chest pain symptomatology.  She has not had laboratory recently.  She has been on Lipitor 20 mg for hyperlipidemia.  She has been taking aspirin 325 mg daily.  She also is on metoprolol, tartrate 25 mg twice a day in addition to lisinopril 5 mg for hypertension.  She takes Protonix 40 mg daily for GERD.  There is a history of gout for which he takes a repeat urine.  All 100 mg daily.  She has a history of obstructive sleep apnea on CPAP therapy.  Past Medical History  Diagnosis Date  . Asthma   . Hypertension   . Sleep apnea   . Nocturia   . Depression   . Morbid obesity   . OSA (obstructive sleep apnea)     w/ hypoventilation  . Hypercholesteremia   . Obstructive sleep apnea (adult) (pediatric) 12/27/2012    AHI 17.7 baseline, titrated to 6 cm  , AHi now 2.4 - compliance     Past Surgical History  Procedure Laterality Date  . Partial hysterectomy    . Knee surgery      chronic pain and limping since  . Cystectomy    . Breast lumpectomy Right     benign at age 26  . Tonsillectomy    . Coronary artery bypass graft N/A 10/20/2013    Procedure: CORONARY ARTERY BYPASS GRAFTING (CABG);  Surgeon: Ivin Poot, MD;  Location: [place];  Service: Open Heart Surgery;  Laterality: N/A;  CABG x 3  . Intraoperative transesophageal echocardiogram N/A 10/20/2013    Procedure: INTRAOPERATIVE TRANSESOPHAGEAL ECHOCARDIOGRAM;  Surgeon: Ivin Poot, MD;  Location: [place];  Service: Open Heart Surgery;  Laterality: N/A;  . Left heart catheterization with coronary angiogram N/A 10/16/2013    Procedure: LEFT HEART CATHETERIZATION WITH CORONARY ANGIOGRAM;  Surgeon: Troy Sine, MD;  Location: [facility] CATH LAB;  Service: Cardiovascular;  Laterality: N/A;    Allergies  Allergen Reactions  . Iohexol Other (See Comments)     Code: HIVES, Desc: premedicated 2 hrs prior with 28m soulumedrol IV, no reaction s/p injection, Onset Date: [id number]  . Penicillins Other (See Comments)    REACTION: pruritis  . Shellfish Allergy Other (See Comments)    Cannot have because of gout    Current Outpatient Prescriptions  Medication Sig Dispense Refill  . acyclovir (  ZOVIRAX) 200 MG capsule Take 200 mg by mouth every morning.    Marland Kitchen albuterol (PROVENTIL HFA;VENTOLIN HFA) 108 (90 BASE) MCG/ACT inhaler Inhale 1 puff into the lungs 2 (two) times daily.    Marland Kitchen allopurinol (ZYLOPRIM) 100 MG tablet TAKE 1 TABLET (100 MG TOTAL) BY MOUTH DAILY. 30 tablet 5  . aspirin 81 MG tablet Take 81 mg by mouth once.    Marland Kitchen aspirin 81 MG tablet Take 81 mg by mouth daily.    Marland Kitchen atorvastatin (LIPITOR) 20 MG tablet Take 1 tablet (20 mg total) by mouth daily. 30 tablet 6  . buPROPion (WELLBUTRIN SR) 100 MG 12 hr tablet Take 100 mg by mouth 2 (two) times daily.    . indomethacin (INDOCIN) 50 MG  capsule Take 50 mg by mouth daily.     Marland Kitchen Ketotifen Fumarate (ALAWAY OP) Place 1 drop into both eyes daily as needed (for dry eyes).    Marland Kitchen lisinopril (PRINIVIL,ZESTRIL) 10 MG tablet TAKE 0.5 TABLETS (5 MG TOTAL) BY MOUTH DAILY. 15 tablet 11  . Menthol, Topical Analgesic, (BENGAY EX) Apply 1 application topically daily.    . metoprolol tartrate (LOPRESSOR) 25 MG tablet Take 1 tablet (25 mg total) by mouth 2 (two) times daily. 60 tablet 11  . metoprolol tartrate (LOPRESSOR) 25 MG tablet TAKE 1 TABLET BY MOUTH TWICE A DAY 60 tablet 11  . naphazoline-pheniramine (NAPHCON-A) 0.025-0.3 % ophthalmic solution Place 1 drop into both eyes 4 (four) times daily as needed for irritation.    . pantoprazole (PROTONIX) 40 MG tablet Take 40 mg by mouth every morning.    Marland Kitchen PARoxetine (PAXIL) 20 MG tablet Take 20 mg by mouth every morning.    . Prenatal Vit-Fe Fumarate-FA (PRENATAL MULTIVITAMIN) TABS tablet Take 1 tablet by mouth daily at 12 noon.    . triamcinolone cream (KENALOG) 0.1 % Apply 1 application topically 2 (two) times daily.     No current facility-administered medications for this visit.    History   Social History  . Marital Status: Legally Separated    Spouse Name: N/A    Number of Children: N/A  . Years of Education: 12   Occupational History  .      history of shift work   Social History Main Topics  . Smoking status: Former Smoker -- 15 years    Types: Cigarettes  . Smokeless tobacco: Never Used     Comment: Quit 2005  . Alcohol Use: No  . Drug Use: No  . Sexual Activity: No   Other Topics Concern  . Not on file   Social History Narrative   Patient lives at home alone.     Family History  Problem Relation Age of Onset  . Cancer Sister     double mastectomy  . Heart attack Sister     2 sisters in their 19's with MI/stents  . Heart attack Mother 7    died of MI  . Heart attack Father 61    died of MI    ROS General: Negative; No fevers, chills, or night sweats;  positive for obesity HEENT: Negative; No changes in vision or hearing, sinus congestion, difficulty swallowing Pulmonary: Negative; No cough, wheezing, shortness of breath, hemoptysis Cardiovascular: See HPI: No chest pain, presyncope, syncope, palpatations GI: Negative; No nausea, vomiting, diarrhea, or abdominal pain GU: Negative; No dysuria, hematuria, or difficulty voiding Musculoskeletal: Negative; no myalgias, joint pain, or weakness Hematologic: Negative; no easy bruising, bleeding Endocrine: Negative; no heat/cold intolerance; no  diabetes, Neuro: Negative; no changes in balance, headaches Skin: Negative; No rashes or skin lesions Psychiatric: Negative; No behavioral problems, depression Sleep: Positive for obstructive sleep apnea No snoring,  daytime sleepiness, hypersomnolence, bruxism, restless legs, hypnogognic hallucinations. Other comprehensive 14 point system review is negative   Physical Exam BP 124/84 mmHg  Pulse 65  Ht _0  (1.702 m)  Wt 232 lb 12.8 oz (105.597 kg)  BMI 36.45 kg/m2 General: Alert, oriented, no distress.  Skin: normal turgor, no rashes, warm and dry HEENT: Normocephalic, atraumatic. Pupils equal round and reactive to light; sclera anicteric; extraocular muscles intact, No lid lag; Nose without nasal septal hypertrophy; Mouth/Parynx benign; Mallinpatti scale 3 Neck: No JVD, no carotid bruits; normal carotid upstroke Lungs: clear to ausculatation and percussion bilaterally; no wheezing or rales, normal inspiratory and expiratory effort Chest wall: without tenderness to palpitation; prominent keloid scarring Heart: PMI not displaced, RRR, s1 s2 normal, faint 1/6 systolic murmur, No diastolic murmur, no rubs, gallops, thrills, or heaves Abdomen: soft, nontender; no hepatosplenomehaly, BS+; abdominal aorta nontender and not dilated by palpation. Back: no CVA tenderness Pulses: 2+  Musculoskeletal: full range of motion, normal strength, no joint  deformities Extremities: Pulses 2+, no clubbing cyanosis or edema, Homan's sign negative  Neurologic: grossly nonfocal; Cranial nerves grossly wnl Psychologic: Normal mood and affect   ECG (independently read by me): Normal sinus rhythm at 65 bpm.  Nonspecific T-wave changes V1 and V2.  Normal intervals.  LABS:  BMP Latest Ref Rng 06/26/2014 01/28/2014 10/24/2013  Glucose 70 - 99 mg/dL 93 110(H) 90  BUN 6 - 23 mg/dL _1 Creatinine 0.50 - 1.10 mg/dL 0.79 0.71 0.79  Sodium 135 - 145 mmol/L 138 142 135(L)  Potassium 3.5 - 5.1 mmol/L 3.0(L) 3.3(L) 4.3  Chloride 96 - 112 mEq/L 101 102 100  CO2 19 - 32 mmol/L _2 Calcium 8.4 - 10.5 mg/dL 9.0 9.2 8.7    Hepatic Function Latest Ref Rng 02/18/2010 12/17/2009 09/01/2009  Total Protein 6.0-8.3 g/dL 7.3 7.3 7.0  Albumin 3.5-5.2 g/dL 3.9 4.0 3.9  AST 0-37 units/L 40(H) 28 16  ALT 0-35 units/L 42(H) 30 19  Alk Phosphatase 39-117 units/L 162(H) 135(H) 140(H)  Total Bilirubin 0.3-1.2 mg/dL 0.3 0.4 0.2(L)     CBC Latest Ref Rng 06/26/2014 01/28/2014 10/23/2013  WBC 4.0 - 10.5 K/uL 8.0 6.4 11.9(H)  Hemoglobin 12.0 - 15.0 g/dL 14.5 13.6 8.8(L)  Hematocrit 36.0 - 46.0 % 43.0 40.4 27.2(L)  Platelets 150 - 400 K/uL 281 274 172     BNP No results found for: BNP  ProBNP    Component Value Date/Time   PROBNP 41.4 10/15/2013 0802     Lipid Panel     Component Value Date/Time   CHOL 180 10/17/2013 2244   TRIG 98 10/17/2013 2244   HDL 52 10/17/2013 2244   CHOLHDL 3.5 10/17/2013 2244   VLDL 20 10/17/2013 2244   LDLCALC 108* 10/17/2013 2244     RADIOLOGY: Dg Chest 2 View  06/26/2014   CLINICAL DATA:  Acute onset of mid to left-sided chest pain and left arm pain. Initial encounter.  EXAM: CHEST  2 VIEW  COMPARISON:  Chest radiograph performed 01/28/2014  FINDINGS: The lungs are well-aerated and clear. There is no evidence of focal opacification, pleural effusion or pneumothorax.  The heart is borderline normal in size. The patient is  status post median sternotomy, with evidence of prior CABG. No acute osseous abnormalities are seen.  IMPRESSION: No  acute cardiopulmonary process seen. No displaced rib fracture identified.   Electronically Signed   By: Garald Balding M.D.   On: 06/26/2014 06:17      ASSESSMENT AND PLAN: Amber Wong is a 59 year old female who was found to have severe coronary obstructive disease at diagnostic catheterization in May 2015.  She underwent CABG revascularization surgery 32.  Her left coronary system.  Her RCA was not bypassed.  Her blood pressure today is stable on her regimen consisting of metoprolol, tartrate 25 Mill grams twice a day in addition to lisinopril 5 mg.  In May 2015.  Her cholesterol was 180 and LDL 108.  She has been on atorvastatin but has not had any subsequent laboratory.  Prior laboratory has shown hypokalemia.  This will need to be reassessed and if necessary, she may require potassium supplementation or further evaluation as to its etiology not on diuretic regimen.  She has moderate obesity with a body mass index of 36.45.  I discussed the importance of weight loss.  I discussed exercise prescription with walking at least 5 days per week for 30 minutes at a time.  I reduced her aspirin to 81 mg instead of 325 mg.  She was questioning about if she can travel to [place].  She also inquired about resumption of sexual activity and I have recommended that this can be pursued.  I am recommending a complete set of repeat laboratory be obtained.  I will notify her regarding the results and adjustments will be made for medical regimen if necessary.  As long as she remains stable, I will see her in 6 months for cardiology reevaluation.     Troy Sine, MD, [facility]  07/20/2014 6:06 PM

## 2014-07-24 LAB — LIPID PANEL
Cholesterol: 144 mg/dL (ref 0–200)
HDL: 51 mg/dL (ref >39)
LDL CALC: 78 mg/dL (ref 0–99)
Total CHOL/HDL Ratio: 2.8 Ratio
Triglycerides: 73 mg/dL (ref <150)
VLDL: 15 mg/dL (ref 0–40)

## 2014-07-24 LAB — CBC
HCT: 42.1 % (ref 36.0–46.0)
Hemoglobin: 14.4 g/dL (ref 12.0–15.0)
MCH: 29.5 pg (ref 26.0–34.0)
MCHC: 34.2 g/dL (ref 30.0–36.0)
MCV: 86.3 fL (ref 78.0–100.0)
MPV: 6.2 fL — ABNORMAL LOW (ref 8.6–12.4)
PLATELETS: 295 10*3/uL (ref 150–400)
RBC: 4.88 MIL/uL (ref 3.87–5.11)
RDW: 14.2 % (ref 11.5–15.5)
WBC: 6.4 10*3/uL (ref 4.0–10.5)

## 2014-07-24 LAB — COMPREHENSIVE METABOLIC PANEL
ALK PHOS: 163 U/L — AB (ref 39–117)
ALT: 18 U/L (ref 0–35)
AST: 20 U/L (ref 0–37)
Albumin: 3.9 g/dL (ref 3.5–5.2)
BILIRUBIN TOTAL: 0.4 mg/dL (ref 0.2–1.2)
BUN: 10 mg/dL (ref 6–23)
CO2: 29 mEq/L (ref 19–32)
CREATININE: 0.78 mg/dL (ref 0.50–1.10)
Calcium: 9.2 mg/dL (ref 8.4–10.5)
Chloride: 103 mEq/L (ref 96–112)
Glucose, Bld: 73 mg/dL (ref 70–99)
Potassium: 3.4 mEq/L — ABNORMAL LOW (ref 3.5–5.3)
Sodium: 143 mEq/L (ref 135–145)
Total Protein: 7.2 g/dL (ref 6.0–8.3)

## 2014-07-24 LAB — TSH: TSH: 0.819 u[IU]/mL (ref 0.350–4.500)

## 2014-07-28 ENCOUNTER — Telehealth: Payer: Self-pay | Admitting: Cardiovascular Disease

## 2014-07-28 NOTE — Telephone Encounter (Signed)
Pt called in stating that she has some blood work done on 2/12 and she wanted to know if her test results were in. Please f/u with pt  Thanks

## 2014-07-28 NOTE — Telephone Encounter (Signed)
Jadah is calling about her lab results please call .Marland Kitchen Thanks

## 2014-07-28 NOTE — Telephone Encounter (Signed)
Called and informed patient of labs results. Also told patient results were released into my chart for her review.

## 2014-07-28 NOTE — Telephone Encounter (Signed)
Returned call to patient Dr.Kelly reviewed 07/26/14 lab and results ok.Advised to continue same medications.

## 2014-07-29 ENCOUNTER — Telehealth: Payer: Self-pay | Admitting: Cardiovascular Disease

## 2014-07-29 NOTE — Telephone Encounter (Signed)
Pt called in stating that [employer] will be faxing over a form that needs to be signed by Dr. Claiborne Billings as soon as possible and she also wanted to note that once she does return back to work, her form of transportation will be [employer].   Thanks

## 2014-07-30 NOTE — Telephone Encounter (Signed)
A form was signed the day of her office visit. Will wait to receive the mentioned fax to see if it is the same paper.

## 2014-07-31 ENCOUNTER — Ambulatory Visit: Payer: Medicaid Other | Admitting: Cardiovascular Disease

## 2014-08-18 ENCOUNTER — Telehealth: Payer: Self-pay | Admitting: Cardiovascular Disease

## 2014-08-18 NOTE — Telephone Encounter (Signed)
Pt states she wants a note for [employer] transportation because she doesn't want people riding the city bus bumping up against her chest. No other reasons specified. Told her I can let Dr. Claiborne Billings review medical necessity but that we would need to get a form from [place] faxed. Provided number for our fax line.  She has a separate form that was filled out last month for medical service transport, she clarified that this was not the same thing.   Advised I will route to Dr. Evette Georges nurse.

## 2014-08-18 NOTE — Telephone Encounter (Signed)
Please call,she need a note for [employer].She is not able to ride the city bus,she need to ride the [employer] transportation. She will give you more details.

## 2014-08-21 ENCOUNTER — Telehealth: Payer: Self-pay | Admitting: Cardiovascular Disease

## 2014-08-21 NOTE — Telephone Encounter (Signed)
Talked to pt. And told her there was no mention of Diabetes in her chart and I asked her if she had any symptoms and she denied all symptoms

## 2014-08-21 NOTE — Telephone Encounter (Signed)
She wants a nurse to call her and reassure her that she does not have diabetes. She found out the other day she have cataracts, she is very concerned.

## 2014-12-25 ENCOUNTER — Telehealth: Payer: Self-pay | Admitting: Cardiovascular Disease

## 2014-12-25 NOTE — Telephone Encounter (Signed)
Pt is > 1 yrr s/p CABG. OK to work without restrictions

## 2014-12-25 NOTE — Telephone Encounter (Signed)
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 She indicates that they have sent [place] a faxed form to be filled out. Will send to Barry Brunner to review.

## 2014-12-25 NOTE — Telephone Encounter (Signed)
Form completed and left for Charlotte Sanes RN to get signature from Dr. Claiborne Billings and return to [facility] as I will be on Vacation until 01/07/15

## 2014-12-25 NOTE — Telephone Encounter (Signed)
Left msg for pt to call. 

## 2014-12-25 NOTE — Telephone Encounter (Signed)
Pt said [facility] is waiting hear from Dr Claiborne Billings to see what kind of work she can do.

## 2014-12-25 NOTE — Telephone Encounter (Signed)
Amber Wong was calling back to give you a number  [phone]

## 2014-12-31 ENCOUNTER — Telehealth: Payer: Self-pay | Admitting: *Deleted

## 2014-12-31 ENCOUNTER — Telehealth: Payer: Self-pay | Admitting: Cardiovascular Disease

## 2014-12-31 NOTE — Telephone Encounter (Signed)
Mariann Laster completed [facility] form for patient as patient requested. Dr. Claiborne Billings signed today.  Left message instructing patient that she is able to pick up form for [employer] at front desk of our office; to call us if any questions.

## 2014-12-31 NOTE — Telephone Encounter (Signed)
Address verified by Rollene Fare, placed pt form in outgoing mail - envelope addressed to pt home.

## 2014-12-31 NOTE — Telephone Encounter (Signed)
Pt called in wanting to know if she has any restrictions when returning back to work. She also asked if the paperwork that Dr. Claiborne Billings completed could be mailed to her since she has limited transportation. Please call  Thanks

## 2015-01-05 ENCOUNTER — Telehealth: Payer: Self-pay | Admitting: Cardiovascular Disease

## 2015-01-05 NOTE — Telephone Encounter (Signed)
Pt is calling in to see why she had not received her letter in the mail that Ovid Curd was suppose to send out last week in regards to her returning to work and her work restrictions. Please f/u with her   Thanks

## 2015-01-05 NOTE — Telephone Encounter (Signed)
Left message on voicemail  letter was sent out 12/31/14 or 01/01/15-should be there in a few days if not received by Friday call office.

## 2015-03-17 ENCOUNTER — Ambulatory Visit (INDEPENDENT_AMBULATORY_CARE_PROVIDER_SITE_OTHER): Payer: Medicaid Other | Admitting: Cardiovascular Disease

## 2015-03-17 VITALS — BP 142/98 | HR 60 | Ht 68.75 in | Wt 233.8 lb

## 2015-03-17 DIAGNOSIS — I10 Essential (primary) hypertension: Secondary | ICD-10-CM

## 2015-03-17 DIAGNOSIS — Z79899 Other long term (current) drug therapy: Secondary | ICD-10-CM

## 2015-03-17 DIAGNOSIS — I25810 Atherosclerosis of coronary artery bypass graft(s) without angina pectoris: Secondary | ICD-10-CM | POA: Diagnosis not present

## 2015-03-17 DIAGNOSIS — R0789 Other chest pain: Secondary | ICD-10-CM

## 2015-03-17 DIAGNOSIS — E785 Hyperlipidemia, unspecified: Secondary | ICD-10-CM | POA: Diagnosis not present

## 2015-03-17 MED ORDER — ISOSORBIDE MONONITRATE ER 30 MG PO TB24: 30.0000 mg | ORAL_TABLET | Freq: Every day | ORAL | Status: DC

## 2015-03-17 MED ORDER — NITROGLYCERIN 0.4 MG SL SUBL: 0.4000 mg | SUBLINGUAL_TABLET | SUBLINGUAL | Status: DC | PRN

## 2015-03-17 NOTE — Patient Instructions (Signed)
Your physician has requested that you have a lexiscan myoview. For further information please visit [email or web address]. Please follow instruction sheet, as given.   Your physician recommends that you return for lab work fasting.  Your physician has recommended you make the following change in your medication: start new prescription for isosorbide mg 30 mg daily. A prescription has been sent to your pharmacy.  Your physician recommends that you schedule a follow-up appointment in: 6 weeks.

## 2015-03-19 ENCOUNTER — Encounter: Payer: Self-pay | Admitting: Cardiovascular Disease

## 2015-03-19 DIAGNOSIS — E785 Hyperlipidemia, unspecified: Secondary | ICD-10-CM | POA: Insufficient documentation

## 2015-03-19 NOTE — Progress Notes (Signed)
Patient ID: Amber Wong, female   DOB: [birth year], 59 y.o.   MRN: [id number]     HPI: Amber Wong is a 59 y.o. female who presents to the office today for an 8 month cardiology evaluation.   Amber Wong has a history of morbid obesity, hypertension, hyperlipidemia, and strong family history for CAD.  She was admitted to [facility] hospital in May 2015 with chest pain described as a "elephant "sitting on her chest.  I performed cardiac catheterization on 10/16/2013 and she was found to have hyperdynamic LV function and evidence for severe 80% distal left main stenosis with 50% diffuse proximal LAD stenosis and otherwise normal circumflex and RCA vessels.  She underwent CABG x3 revascularization surgery by Dr. Myles Lipps and had a LIMA placed to her LAD, SVG to obtuse marginal, and SVG to her diagonal vessel with Endo vascular vein harvesting from her right thigh.   She was seen by Tarri Fuller in October 2015 with complaints involving her left leg, knee, and great toe.  When I saw her in February 2016 she noted marked improvement in her previous chest pain symptomatology.  She has a history of hypertension and has been on Lopressor 25 g twice a day and lisinopril 10 mg daily.  She is on how you Purinol for gout.  She has GERD which is been controlled with Protonix 40 mg daily.  She has been on Lipitor 20 mg for hyperlipidemia.  Recently, she has begun to notice left-sided chest and arm discomfort.  At times she feels it may be somewhat similar to prior to her surgery.  She has not tried nitroglycerin.  She presents for evaluation.  Past Medical History  Diagnosis Date  . Asthma   . Hypertension   . Sleep apnea   . Nocturia   . Depression   . Morbid obesity ([place])   . OSA (obstructive sleep apnea)     w/ hypoventilation  . Hypercholesteremia   . Obstructive sleep apnea (adult) (pediatric) 12/27/2012    AHI 17.7 baseline, titrated to 6 cm , AHi now 2.4 -  compliance     Past Surgical History  Procedure Laterality Date  . Partial hysterectomy    . Knee surgery      chronic pain and limping since  . Cystectomy    . Breast lumpectomy Right     benign at age 51  . Tonsillectomy    . Coronary artery bypass graft N/A 10/20/2013    Procedure: CORONARY ARTERY BYPASS GRAFTING (CABG);  Surgeon: Ivin Poot, MD;  Location: [place];  Service: Open Heart Surgery;  Laterality: N/A;  CABG x 3  . Intraoperative transesophageal echocardiogram N/A 10/20/2013    Procedure: INTRAOPERATIVE TRANSESOPHAGEAL ECHOCARDIOGRAM;  Surgeon: Ivin Poot, MD;  Location: [place];  Service: Open Heart Surgery;  Laterality: N/A;  . Left heart catheterization with coronary angiogram N/A 10/16/2013    Procedure: LEFT HEART CATHETERIZATION WITH CORONARY ANGIOGRAM;  Surgeon: Troy Sine, MD;  Location: [facility] CATH LAB;  Service: Cardiovascular;  Laterality: N/A;    Allergies  Allergen Reactions  . Iohexol Other (See Comments)     Code: HIVES, Desc: premedicated 2 hrs prior with $RemoveBe'200mg'TcNTzoTSc$  soulumedrol IV, no reaction s/p injection, Onset Date: [id number]   . Penicillins Other (See Comments)    REACTION: pruritis  . Shellfish Allergy Other (See Comments)    Cannot have because of gout    Current Outpatient Prescriptions  Medication Sig Dispense Refill  . acyclovir (ZOVIRAX) 200  MG capsule Take 200 mg by mouth every morning.    Marland Kitchen albuterol (PROVENTIL HFA;VENTOLIN HFA) 108 (90 BASE) MCG/ACT inhaler Inhale 1 puff into the lungs 2 (two) times daily.    Marland Kitchen allopurinol (ZYLOPRIM) 100 MG tablet TAKE 1 TABLET (100 MG TOTAL) BY MOUTH DAILY. 30 tablet 5  . aspirin 81 MG tablet Take 81 mg by mouth once.    Marland Kitchen atorvastatin (LIPITOR) 20 MG tablet Take 1 tablet (20 mg total) by mouth daily. 30 tablet 6  . buPROPion (WELLBUTRIN SR) 100 MG 12 hr tablet Take 100 mg by mouth 2 (two) times daily.    Marland Kitchen Ketotifen Fumarate (ALAWAY OP) Place 1 drop into both eyes daily as needed (for dry eyes).    Marland Kitchen  lisinopril (PRINIVIL,ZESTRIL) 10 MG tablet TAKE 0.5 TABLETS (5 MG TOTAL) BY MOUTH DAILY. 15 tablet 11  . meloxicam (MOBIC) 15 MG tablet Take 15 mg by mouth as needed.  2  . Menthol, Topical Analgesic, (BENGAY EX) Apply 1 application topically daily.    . metoprolol tartrate (LOPRESSOR) 25 MG tablet TAKE 1 TABLET BY MOUTH TWICE A DAY 60 tablet 11  . naphazoline-pheniramine (NAPHCON-A) 0.025-0.3 % ophthalmic solution Place 1 drop into both eyes 4 (four) times daily as needed for irritation.    . pantoprazole (PROTONIX) 40 MG tablet Take 40 mg by mouth every morning.    Marland Kitchen PARoxetine (PAXIL) 20 MG tablet Take 20 mg by mouth every morning.    . Prenatal Vit-Fe Fumarate-FA (PRENATAL MULTIVITAMIN) TABS tablet Take 1 tablet by mouth daily at 12 noon.    . triamcinolone cream (KENALOG) 0.1 % Apply 1 application topically 2 (two) times daily.    . isosorbide mononitrate (IMDUR) 30 MG 24 hr tablet Take 1 tablet (30 mg total) by mouth daily. 30 tablet 6  . nitroGLYCERIN (NITROSTAT) 0.4 MG SL tablet Place 1 tablet (0.4 mg total) under the tongue every 5 (five) minutes as needed for chest pain. 30 tablet 3   No current facility-administered medications for this visit.    Social History   Social History  . Marital Status: Legally Separated    Spouse Name: N/A  . Number of Children: N/A  . Years of Education: 12   Occupational History  .      history of shift work   Social History Main Topics  . Smoking status: Former Smoker -- 15 years    Types: Cigarettes  . Smokeless tobacco: Never Used     Comment: Quit 2005  . Alcohol Use: No  . Drug Use: No  . Sexual Activity: No   Other Topics Concern  . Not on file   Social History Narrative   Patient lives at home alone.     Family History  Problem Relation Age of Onset  . Cancer Sister     double mastectomy  . Heart attack Sister     2 sisters in their 38's with MI/stents  . Heart attack Mother 58    died of MI  . Heart attack Father 41      died of MI    ROS General: Negative; No fevers, chills, or night sweats; positive for obesity HEENT: Negative; No changes in vision or hearing, sinus congestion, difficulty swallowing Pulmonary: Negative; No cough, wheezing, shortness of breath, hemoptysis Cardiovascular: See HPI:  GI: Negative; No nausea, vomiting, diarrhea, or abdominal pain GU: Negative; No dysuria, hematuria, or difficulty voiding Musculoskeletal: Negative; no myalgias, joint pain, or weakness Hematologic: Negative; no easy  bruising, bleeding Rheumatologic: Positive for gout Endocrine: Negative; no heat/cold intolerance; no diabetes, Neuro: Negative; no changes in balance, headaches Skin: Negative; No rashes or skin lesions Psychiatric: Negative; No behavioral problems, depression Sleep: Positive for obstructive sleep apnea No snoring,  daytime sleepiness, hypersomnolence, bruxism, restless legs, hypnogognic hallucinations. Other comprehensive 14 point system review is negative   Physical Exam BP 142/98 mmHg  Pulse 60  Ht 5' 8.75" (1.746 m)  Wt 233 lb 12.8 oz (106.051 kg)  BMI 34.79 kg/m2   Wt Readings from Last 3 Encounters:  03/17/15 233 lb 12.8 oz (106.051 kg)  07/20/14 232 lb 12.8 oz (105.597 kg)  03/25/14 224 lb 11.2 oz (101.923 kg)   General: Alert, oriented, no distress.  Skin: normal turgor, no rashes, warm and dry HEENT: Normocephalic, atraumatic. Pupils equal round and reactive to light; sclera anicteric; extraocular muscles intact, No lid lag; Nose without nasal septal hypertrophy; Mouth/Parynx benign; Mallinpatti scale 3 Neck: No JVD, no carotid bruits; normal carotid upstroke Lungs: clear to ausculatation and percussion bilaterally; no wheezing or rales, normal inspiratory and expiratory effort Chest wall: without tenderness to palpitation; prominent keloid scarring Heart: PMI not displaced, RRR, s1 s2 normal,  1/6 systolic murmur, No diastolic murmur, no rubs, gallops, thrills, or  heaves Abdomen: soft, nontender; no hepatosplenomehaly, BS+; abdominal aorta nontender and not dilated by palpation. Back: no CVA tenderness Pulses: 2+  Musculoskeletal: full range of motion, normal strength, no joint deformities Extremities: Pulses 2+, no clubbing cyanosis or edema, Homan's sign negative  Neurologic: grossly nonfocal; Cranial nerves grossly wnl Psychologic: Normal mood and affect  ECG (independently read by me): Normal sinus rhythm at 60 bpm.  Nonspecific T changes.   ECG (independently read by me): Normal sinus rhythm at 65 bpm.  Nonspecific T-wave changes V1 and V2.  Normal intervals.  LABS:  BMP Latest Ref Rng 07/24/2014 06/26/2014 01/28/2014  Glucose 70 - 99 mg/dL 73 93 110(H)  BUN 6 - 23 mg/dL 10 9 10   Creatinine 0.50 - 1.10 mg/dL 0.78 0.79 0.71  Sodium 135 - 145 mEq/L 143 138 142  Potassium 3.5 - 5.3 mEq/L 3.4(L) 3.0(L) 3.3(L)  Chloride 96 - 112 mEq/L 103 101 102  CO2 19 - 32 mEq/L 29 27 25   Calcium 8.4 - 10.5 mg/dL 9.2 9.0 9.2   Hepatic Function Latest Ref Rng 07/24/2014 02/18/2010 12/17/2009  Total Protein 6.0 - 8.3 g/dL 7.2 7.3 7.3  Albumin 3.5 - 5.2 g/dL 3.9 3.9 4.0  AST 0 - 37 U/L 20 40(H) 28  ALT 0 - 35 U/L 18 42(H) 30  Alk Phosphatase 39 - 117 U/L 163(H) 162(H) 135(H)  Total Bilirubin 0.2 - 1.2 mg/dL 0.4 0.3 0.4    CBC Latest Ref Rng 07/24/2014 06/26/2014 01/28/2014  WBC 4.0 - 10.5 K/uL 6.4 8.0 6.4  Hemoglobin 12.0 - 15.0 g/dL 14.4 14.5 13.6  Hematocrit 36.0 - 46.0 % 42.1 43.0 40.4  Platelets 150 - 400 K/uL 295 281 274    BNP No results found for: BNP  ProBNP    Component Value Date/Time   PROBNP 41.4 10/15/2013 0802   Lipid Panel     Component Value Date/Time   CHOL 144 07/24/2014 0922   TRIG 73 07/24/2014 0922   HDL 51 07/24/2014 0922   CHOLHDL 2.8 07/24/2014 0922   VLDL 15 07/24/2014 0922   LDLCALC 78 07/24/2014 0922     RADIOLOGY: No results found.    ASSESSMENT AND PLAN: Amber Wong is a 59 year old female who was  found  to have severe coronary obstructive disease at diagnostic catheterization in May 2015.  She underwent CABG revascularization surgery 3 to her left coronary system.  Her RCA was not bypassed.  She recently has developed left-sided and chest discomfort.  In some instances was concerned that this may be similar to her prior discomfort.  I am starting her on him.  Isosorbide mononitrate 30 mg and given her prescription for some little nitroglycerin.  I will schedule her for Shindler study to assess for potential ischemia.  Her blood pressure today was mildly elevated on lisinopril and metoprolol tartrate.  This may improve with the addition of nitrates to her medical regimen but if her blood pressure remains elevated, further titration of lisinopril will be necessary.  Laboratory in February revealed an LDL cholesterol at 78 on her current regimen of atorvastatin 20 mg.  Fasting laboratory will be obtained.  She has moderate obesity and weight loss was discussed.  I will see her in follow-up of her nuclear perfusion study and further recommendations will be made at that time.  Time spent: 25 minutes  Troy Sine, MD, [facility]  03/19/2015 10:39 PM

## 2015-03-23 ENCOUNTER — Telehealth (HOSPITAL_COMMUNITY): Payer: Self-pay

## 2015-03-23 NOTE — Telephone Encounter (Signed)
Encounter complete. 

## 2015-03-25 ENCOUNTER — Ambulatory Visit (HOSPITAL_COMMUNITY)
Admission: RE | Admit: 2015-03-25 | Discharge: 2015-03-25 | Disposition: A | Discharge status: Home / Self Care | Payer: Medicaid Other | Source: Ambulatory Visit | Attending: Cardiovascular Disease | Admitting: Cardiovascular Disease

## 2015-03-25 ENCOUNTER — Telehealth (HOSPITAL_COMMUNITY): Payer: Self-pay

## 2015-03-25 DIAGNOSIS — M542 Cervicalgia: Secondary | ICD-10-CM | POA: Diagnosis not present

## 2015-03-25 DIAGNOSIS — I10 Essential (primary) hypertension: Secondary | ICD-10-CM | POA: Diagnosis not present

## 2015-03-25 DIAGNOSIS — Z87891 Personal history of nicotine dependence: Secondary | ICD-10-CM | POA: Diagnosis not present

## 2015-03-25 DIAGNOSIS — R0602 Shortness of breath: Secondary | ICD-10-CM | POA: Diagnosis not present

## 2015-03-25 DIAGNOSIS — E669 Obesity, unspecified: Secondary | ICD-10-CM | POA: Diagnosis not present

## 2015-03-25 DIAGNOSIS — R5383 Other fatigue: Secondary | ICD-10-CM | POA: Insufficient documentation

## 2015-03-25 DIAGNOSIS — Z8249 Family history of ischemic heart disease and other diseases of the circulatory system: Secondary | ICD-10-CM | POA: Insufficient documentation

## 2015-03-25 DIAGNOSIS — R0609 Other forms of dyspnea: Secondary | ICD-10-CM | POA: Insufficient documentation

## 2015-03-25 DIAGNOSIS — Z6835 Body mass index (BMI) 35.0-35.9, adult: Secondary | ICD-10-CM | POA: Diagnosis not present

## 2015-03-25 DIAGNOSIS — R0789 Other chest pain: Secondary | ICD-10-CM | POA: Diagnosis not present

## 2015-03-25 DIAGNOSIS — R42 Dizziness and giddiness: Secondary | ICD-10-CM | POA: Insufficient documentation

## 2015-03-25 DIAGNOSIS — I25810 Atherosclerosis of coronary artery bypass graft(s) without angina pectoris: Secondary | ICD-10-CM

## 2015-03-25 DIAGNOSIS — M79602 Pain in left arm: Secondary | ICD-10-CM | POA: Diagnosis not present

## 2015-03-25 DIAGNOSIS — G4733 Obstructive sleep apnea (adult) (pediatric): Secondary | ICD-10-CM | POA: Insufficient documentation

## 2015-03-25 LAB — CBC
HEMATOCRIT: 41.6 % (ref 36.0–46.0)
Hemoglobin: 14 g/dL (ref 12.0–15.0)
MCH: 29.7 pg (ref 26.0–34.0)
MCHC: 33.7 g/dL (ref 30.0–36.0)
MCV: 88.3 fL (ref 78.0–100.0)
MPV: 10.3 fL (ref 8.6–12.4)
PLATELETS: 275 10*3/uL (ref 150–400)
RBC: 4.71 MIL/uL (ref 3.87–5.11)
RDW: 14.6 % (ref 11.5–15.5)
WBC: 5.7 10*3/uL (ref 4.0–10.5)

## 2015-03-25 MED ORDER — TECHNETIUM TC 99M SESTAMIBI GENERIC - CARDIOLITE
30.1000 | Freq: Once | INTRAVENOUS | Status: AC | PRN
  Administered 2015-03-25: 30.1 via INTRAVENOUS

## 2015-03-25 MED ORDER — AMINOPHYLLINE 25 MG/ML IV SOLN
100.0000 mg | Freq: Once | INTRAVENOUS | Status: AC
  Administered 2015-03-25: 100 mg via INTRAVENOUS

## 2015-03-25 MED ORDER — REGADENOSON 0.4 MG/5ML IV SOLN
0.4000 mg | Freq: Once | INTRAVENOUS | Status: AC
  Administered 2015-03-25: 0.4 mg via INTRAVENOUS

## 2015-03-25 NOTE — Telephone Encounter (Signed)
Encounter complete. 

## 2015-03-26 ENCOUNTER — Ambulatory Visit (HOSPITAL_COMMUNITY)
Admission: RE | Admit: 2015-03-26 | Discharge: 2015-03-26 | Disposition: A | Discharge status: Home / Self Care | Payer: Medicaid Other | Source: Ambulatory Visit | Attending: Cardiology | Admitting: Cardiology

## 2015-03-26 LAB — MYOCARDIAL PERFUSION IMAGING
CHL CUP NUCLEAR SSS: 10
CHL CUP RESTING HR STRESS: 58 {beats}/min
LV sys vol: 13 mL
LVDIAVOL: 41 mL
Peak HR: 96 {beats}/min
SDS: 4
SRS: 6
TID: 0.61

## 2015-03-26 LAB — COMPREHENSIVE METABOLIC PANEL
ALT: 18 U/L (ref 6–29)
AST: 20 U/L (ref 10–35)
Albumin: 3.8 g/dL (ref 3.6–5.1)
Alkaline Phosphatase: 145 U/L — ABNORMAL HIGH (ref 33–130)
BUN: 10 mg/dL (ref 7–25)
CALCIUM: 9.1 mg/dL (ref 8.6–10.4)
CO2: 30 mmol/L (ref 20–31)
Chloride: 103 mmol/L (ref 98–110)
Creat: 0.77 mg/dL (ref 0.50–1.05)
GLUCOSE: 80 mg/dL (ref 65–99)
Potassium: 3.8 mmol/L (ref 3.5–5.3)
Sodium: 141 mmol/L (ref 135–146)
Total Bilirubin: 0.4 mg/dL (ref 0.2–1.2)
Total Protein: 6.8 g/dL (ref 6.1–8.1)

## 2015-03-26 LAB — LIPID PANEL
CHOL/HDL RATIO: 2.8 ratio (ref <=5.0)
Cholesterol: 143 mg/dL (ref 125–200)
HDL: 52 mg/dL (ref >=46)
LDL CALC: 76 mg/dL (ref <130)
Triglycerides: 74 mg/dL (ref <150)
VLDL: 15 mg/dL (ref <30)

## 2015-03-26 LAB — TSH: TSH: 0.99 u[IU]/mL (ref 0.350–4.500)

## 2015-03-26 MED ORDER — TECHNETIUM TC 99M SESTAMIBI GENERIC - CARDIOLITE
28.2000 | Freq: Once | INTRAVENOUS | Status: AC | PRN
  Administered 2015-03-26: 28.2 via INTRAVENOUS

## 2015-04-19 ENCOUNTER — Telehealth: Payer: Self-pay | Admitting: Cardiovascular Disease

## 2015-04-19 NOTE — Telephone Encounter (Signed)
Amber Wong is returning your call Amber Wong , Please call   Thanks

## 2015-04-19 NOTE — Telephone Encounter (Signed)
Returned call to patient. Informed her of normal stress test results.

## 2015-04-19 NOTE — Telephone Encounter (Signed)
Returned call - no answer when dialed. Note that results were released in Mychart to patient.

## 2015-04-19 NOTE — Telephone Encounter (Signed)
Pt left message on my voicemail last Tuesday. I was out the entire week. She wants her stress test results from 03-26-15 please.

## 2015-04-28 ENCOUNTER — Ambulatory Visit (INDEPENDENT_AMBULATORY_CARE_PROVIDER_SITE_OTHER): Payer: Medicaid Other | Admitting: Cardiovascular Disease

## 2015-04-28 VITALS — BP 142/90 | HR 68 | Ht 68.75 in | Wt 236.0 lb

## 2015-04-28 DIAGNOSIS — R079 Chest pain, unspecified: Secondary | ICD-10-CM

## 2015-04-28 DIAGNOSIS — E785 Hyperlipidemia, unspecified: Secondary | ICD-10-CM

## 2015-04-28 DIAGNOSIS — I2510 Atherosclerotic heart disease of native coronary artery without angina pectoris: Secondary | ICD-10-CM

## 2015-04-28 DIAGNOSIS — I2583 Coronary atherosclerosis due to lipid rich plaque: Secondary | ICD-10-CM

## 2015-04-28 NOTE — Patient Instructions (Signed)
Your physician wants you to follow-up in: 1 year or sooner if needed. You will receive a reminder letter in the mail two months in advance. If you don't receive a letter, please call our office to schedule the follow-up appointment.   If you need a refill on your cardiac medications before your next appointment, please call your pharmacy.   

## 2015-04-30 ENCOUNTER — Encounter: Payer: Self-pay | Admitting: Cardiovascular Disease

## 2015-04-30 NOTE — Progress Notes (Signed)
Patient ID: Amber Wong, female   DOB: 1956/06/11, 59 y.o.   MRN: [id number]     HPI: Amber Wong is a 59 y.o. female who presents to the office today for an 2 month cardiology evaluation.   Amber Wong has a history of morbid obesity, hypertension, hyperlipidemia, and strong family history for CAD.  She was admitted to [facility] hospital in May 2015 with chest pain described as a "elephant "sitting on her chest.  I performed cardiac catheterization on 10/16/2013 and she was found to have hyperdynamic LV function and evidence for severe 80% distal left main stenosis with 50% diffuse proximal LAD stenosis and otherwise normal circumflex and RCA vessels.  She underwent CABG x3 revascularization surgery by Dr. Myles Lipps and had a LIMA placed to her LAD, SVG to obtuse marginal, and SVG to her diagonal vessel with Endo vascular vein harvesting from her right thigh.   She was seen by Amber Wong in October 2015 with complaints involving her left leg, knee, and great toe.  When I saw her in February 2016 she noted marked improvement in her previous chest pain symptomatology.  She has a history of hypertension and has been on Lopressor 25 g twice a day and lisinopril 10 mg daily.  She is on how you Purinol for gout.  She has GERD which is been controlled with Protonix 40 mg daily.  She has been on Lipitor 20 mg for hyperlipidemia.   When I last saw her she had begun to notice left-sided chest and arm discomfort  And felt that it may have had some similar characteristics than her chest pain prior to surgery. She underwent a nuclear perfusion study on 03/25/2015 which revealed normal perfusion.  Ejection fraction was 67%.  Her chest pain has resolved.  She underwent recent laboratory  as shown below and I reviewed these in detail with her. She presents for evaluation  Past Medical History  Diagnosis Date  . Asthma   . Hypertension   . Sleep apnea   . Nocturia   .  Depression   . Morbid obesity ([place])   . OSA (obstructive sleep apnea)     w/ hypoventilation  . Hypercholesteremia   . Obstructive sleep apnea (adult) (pediatric) 12/27/2012    AHI 17.7 baseline, titrated to 6 cm , AHi now 2.4 - compliance     Past Surgical History  Procedure Laterality Date  . Partial hysterectomy    . Knee surgery      chronic pain and limping since  . Cystectomy    . Breast lumpectomy Right     benign at age 1  . Tonsillectomy    . Coronary artery bypass graft N/A 10/20/2013    Procedure: CORONARY ARTERY BYPASS GRAFTING (CABG);  Surgeon: Ivin Poot, MD;  Location: [place];  Service: Open Heart Surgery;  Laterality: N/A;  CABG x 3  . Intraoperative transesophageal echocardiogram N/A 10/20/2013    Procedure: INTRAOPERATIVE TRANSESOPHAGEAL ECHOCARDIOGRAM;  Surgeon: Ivin Poot, MD;  Location: [place];  Service: Open Heart Surgery;  Laterality: N/A;  . Left heart catheterization with coronary angiogram N/A 10/16/2013    Procedure: LEFT HEART CATHETERIZATION WITH CORONARY ANGIOGRAM;  Surgeon: Troy Sine, MD;  Location: [facility] CATH LAB;  Service: Cardiovascular;  Laterality: N/A;    Allergies  Allergen Reactions  . Iohexol Other (See Comments)     Code: HIVES, Desc: premedicated 2 hrs prior with $RemoveBe'200mg'[id number]$  soulumedrol IV, no reaction s/p injection, Onset Date: 75300511   .  Penicillins Other (See Comments)    REACTION: pruritis  . Shellfish Allergy Other (See Comments)    Cannot have because of gout    Current Outpatient Prescriptions  Medication Sig Dispense Refill  . acyclovir (ZOVIRAX) 200 MG capsule Take 200 mg by mouth every morning.    Marland Kitchen albuterol (PROVENTIL HFA;VENTOLIN HFA) 108 (90 BASE) MCG/ACT inhaler Inhale 1 puff into the lungs 2 (two) times daily.    Marland Kitchen allopurinol (ZYLOPRIM) 100 MG tablet TAKE 1 TABLET (100 MG TOTAL) BY MOUTH DAILY. 30 tablet 5  . aspirin 81 MG tablet Take 81 mg by mouth once.    Marland Kitchen atorvastatin (LIPITOR) 20 MG tablet Take 1 tablet (20  mg total) by mouth daily. 30 tablet 6  . buPROPion (WELLBUTRIN SR) 100 MG 12 hr tablet Take 100 mg by mouth 2 (two) times daily.    . isosorbide mononitrate (IMDUR) 30 MG 24 hr tablet Take 1 tablet (30 mg total) by mouth daily. 30 tablet 6  . Ketotifen Fumarate (ALAWAY OP) Place 1 drop into both eyes daily as needed (for dry eyes).    Marland Kitchen lisinopril (PRINIVIL,ZESTRIL) 10 MG tablet TAKE 0.5 TABLETS (5 MG TOTAL) BY MOUTH DAILY. 15 tablet 11  . meloxicam (MOBIC) 15 MG tablet Take 15 mg by mouth as needed.  2  . metoprolol tartrate (LOPRESSOR) 25 MG tablet TAKE 1 TABLET BY MOUTH TWICE A DAY 60 tablet 11  . nitroGLYCERIN (NITROSTAT) 0.4 MG SL tablet Place 1 tablet (0.4 mg total) under the tongue every 5 (five) minutes as needed for chest pain. 30 tablet 3  . pantoprazole (PROTONIX) 40 MG tablet Take 40 mg by mouth every morning.    Marland Kitchen PARoxetine (PAXIL) 20 MG tablet Take 20 mg by mouth every morning.    . Prenatal Vit-Fe Fumarate-FA (PRENATAL MULTIVITAMIN) TABS tablet Take 1 tablet by mouth daily at 12 noon.    . triamcinolone cream (KENALOG) 0.1 % Apply 1 application topically 2 (two) times daily.     No current facility-administered medications for this visit.    Social History   Social History  . Marital Status: Legally Separated    Spouse Name: N/A  . Number of Children: N/A  . Years of Education: 12   Occupational History  .      history of shift work   Social History Main Topics  . Smoking status: Former Smoker -- 15 years    Types: Cigarettes  . Smokeless tobacco: Never Used     Comment: Quit 2005  . Alcohol Use: No  . Drug Use: No  . Sexual Activity: No   Other Topics Concern  . Not on file   Social History Narrative   Patient lives at home alone.     Family History  Problem Relation Age of Onset  . Cancer Sister     double mastectomy  . Heart attack Sister     2 sisters in their 70's with MI/stents  . Heart attack Mother 31    died of MI  . Heart attack Father 14      died of MI    ROS General: Negative; No fevers, chills, or night sweats; positive for obesity HEENT: Negative; No changes in vision or hearing, sinus congestion, difficulty swallowing Pulmonary: Negative; No cough, wheezing, shortness of breath, hemoptysis Cardiovascular: See HPI:  GI: Negative; No nausea, vomiting, diarrhea, or abdominal pain GU: Negative; No dysuria, hematuria, or difficulty voiding Musculoskeletal: Negative; no myalgias, joint pain, or weakness Hematologic: Negative;  no easy bruising, bleeding Rheumatologic: Positive for gout Endocrine: Negative; no heat/cold intolerance; no diabetes, Neuro: Negative; no changes in balance, headaches Skin: Negative; No rashes or skin lesions Psychiatric: Negative; No behavioral problems, depression Sleep: Positive for obstructive sleep apnea No snoring,  daytime sleepiness, hypersomnolence, bruxism, restless legs, hypnogognic hallucinations. Other comprehensive 14 point system review is negative   Physical Exam BP 142/90 mmHg  Pulse 68  Ht 5' 8.75" (1.746 m)  Wt 236 lb (107.049 kg)  BMI 35.12 kg/m2   Wt Readings from Last 3 Encounters:  04/28/15 236 lb (107.049 kg)  03/25/15 233 lb (105.688 kg)  03/17/15 233 lb 12.8 oz (106.051 kg)   General: Alert, oriented, no distress.  Skin: normal turgor, no rashes, warm and dry HEENT: Normocephalic, atraumatic. Pupils equal round and reactive to light; sclera anicteric; extraocular muscles intact, No lid lag; Nose without nasal septal hypertrophy; Mouth/Parynx benign; Mallinpatti scale 3 Neck: No JVD, no carotid bruits; normal carotid upstroke Lungs: clear to ausculatation and percussion bilaterally; no wheezing or rales, normal inspiratory and expiratory effort Chest wall: without tenderness to palpitation; prominent keloid scarring Heart: PMI not displaced, RRR, s1 s2 normal,  1/6 systolic murmur, No diastolic murmur, no rubs, gallops, thrills, or heaves Abdomen: soft,  nontender; no hepatosplenomehaly, BS+; abdominal aorta nontender and not dilated by palpation. Back: no CVA tenderness Pulses: 2+  Musculoskeletal: full range of motion, normal strength, no joint deformities Extremities: Pulses 2+, no clubbing cyanosis or edema, Homan's sign negative  Neurologic: grossly nonfocal; Cranial nerves grossly wnl Psychologic: Normal mood and affect  March 19 2015 ECG (independently read by me): Normal sinus rhythm at 60 bpm.  Nonspecific T changes.   PriorECG (independently read by me): Normal sinus rhythm at 65 bpm.  Nonspecific T-wave changes V1 and V2.  Normal intervals.  LABS:  BMP Latest Ref Rng 03/25/2015 07/24/2014 06/26/2014  Glucose 65 - 99 mg/dL 80 73 93  BUN 7 - 25 mg/dL 10 10 9   Creatinine 0.50 - 1.05 mg/dL 0.77 0.78 0.79  Sodium 135 - 146 mmol/L 141 143 138  Potassium 3.5 - 5.3 mmol/L 3.8 3.4(L) 3.0(L)  Chloride 98 - 110 mmol/L 103 103 101  CO2 20 - 31 mmol/L 30 29 27   Calcium 8.6 - 10.4 mg/dL 9.1 9.2 9.0   Hepatic Function Latest Ref Rng 03/25/2015 07/24/2014 02/18/2010  Total Protein 6.1 - 8.1 g/dL 6.8 7.2 7.3  Albumin 3.6 - 5.1 g/dL 3.8 3.9 3.9  AST 10 - 35 U/L 20 20 40(H)  ALT 6 - 29 U/L 18 18 42(H)  Alk Phosphatase 33 - 130 U/L 145(H) 163(H) 162(H)  Total Bilirubin 0.2 - 1.2 mg/dL 0.4 0.4 0.3    CBC Latest Ref Rng 03/25/2015 07/24/2014 06/26/2014  WBC 4.0 - 10.5 K/uL 5.7 6.4 8.0  Hemoglobin 12.0 - 15.0 g/dL 14.0 14.4 14.5  Hematocrit 36.0 - 46.0 % 41.6 42.1 43.0  Platelets 150 - 400 K/uL 275 295 281    BNP No results found for: BNP  ProBNP    Component Value Date/Time   PROBNP 41.4 10/15/2013 0802   Lipid Panel     Component Value Date/Time   CHOL 143 03/25/2015 0847   TRIG 74 03/25/2015 0847   HDL 52 03/25/2015 0847   CHOLHDL 2.8 03/25/2015 0847   VLDL 15 03/25/2015 0847   LDLCALC 76 03/25/2015 0847     RADIOLOGY: No results found.    ASSESSMENT AND PLAN: Ms.Ousmanealhassan is a 59 year old female who was found  to  have severe coronary obstructive disease at diagnostic catheterization in May 2015 and underwent CABG revascularization surgery 3 to her left coronary system.  Her RCA was not bypassed.  She recently had developed left-sided and chest discomfort and  was concerned that this may be similar to her prior discomfort.  When I last saw her, I I starting her on Isosorbide mononitrate 30 mg and given her prescription for sl nitroglycerin.   Her Lexiscan continue to show normal perfusion without scar or ischemia. Her blood pressure today on follow-up was improved at 136/80, and I believe this may also benefit from the medication adjustment at her last office visit. Her most reecent lipid panel shows an LDL of 76 on her current dose of atorvastatin 20 mg. She's not having any palpitations and is tolerating metoprolol, tartrate 25 mg twice a day in addition to lisinopril 5 mg for blood pressure control.  Her GERD is stable with Protonix.  She is now seeing Dr. Brett Albino, for primary care.  I will see her in one year for cardiology reevaluation or sooner if problems arise.  Time spent: 25 minutes  Troy Sine, MD, [facility]  04/30/2015 3:06 PM

## 2015-06-01 ENCOUNTER — Telehealth: Payer: Self-pay

## 2015-06-01 NOTE — Telephone Encounter (Signed)
Received a prior approval/CMN request from [facility] for pt's cpap. We have not seen the pt since 2014, therefore she needs a cpap follow up appt. Left a message asking the pt to call us back. If pt calls back, please make her a 30 minute office visit or follow up appt. Thank you!

## 2015-06-22 ENCOUNTER — Ambulatory Visit: Payer: Medicaid Other | Admitting: Cardiovascular Disease

## 2015-06-25 ENCOUNTER — Encounter: Payer: Self-pay | Admitting: Physician Assistant

## 2015-06-25 ENCOUNTER — Ambulatory Visit (INDEPENDENT_AMBULATORY_CARE_PROVIDER_SITE_OTHER): Payer: Medicaid Other | Admitting: Physician Assistant

## 2015-06-25 VITALS — BP 150/90 | HR 66 | Ht 68.0 in | Wt 243.2 lb

## 2015-06-25 DIAGNOSIS — Z79899 Other long term (current) drug therapy: Secondary | ICD-10-CM

## 2015-06-25 DIAGNOSIS — E785 Hyperlipidemia, unspecified: Secondary | ICD-10-CM

## 2015-06-25 DIAGNOSIS — I2510 Atherosclerotic heart disease of native coronary artery without angina pectoris: Secondary | ICD-10-CM | POA: Diagnosis not present

## 2015-06-25 DIAGNOSIS — I2583 Coronary atherosclerosis due to lipid rich plaque: Principal | ICD-10-CM

## 2015-06-25 LAB — HEPATIC FUNCTION PANEL
ALT: 18 U/L (ref 6–29)
AST: 21 U/L (ref 10–35)
Albumin: 4 g/dL (ref 3.6–5.1)
Alkaline Phosphatase: 158 U/L — ABNORMAL HIGH (ref 33–130)
Bilirubin, Direct: 0.1 mg/dL (ref <=0.2)
Indirect Bilirubin: 0.3 mg/dL (ref 0.2–1.2)
Total Bilirubin: 0.4 mg/dL (ref 0.2–1.2)
Total Protein: 6.9 g/dL (ref 6.1–8.1)

## 2015-06-25 LAB — LIPID PANEL
CHOLESTEROL: 140 mg/dL (ref 125–200)
HDL: 52 mg/dL (ref >=46)
LDL Cholesterol: 73 mg/dL (ref <130)
Total CHOL/HDL Ratio: 2.7 Ratio (ref <=5.0)
Triglycerides: 73 mg/dL (ref <150)
VLDL: 15 mg/dL (ref <30)

## 2015-06-25 MED ORDER — ATORVASTATIN CALCIUM 20 MG PO TABS: 20.0000 mg | ORAL_TABLET | Freq: Every day | ORAL | Status: DC

## 2015-06-25 NOTE — Patient Instructions (Signed)
Your physician recommends that you return for fasting lab work .  Your physician recommends that you schedule a follow-up appointment in: 1 year with Dr.kelly as previously planned.

## 2015-06-25 NOTE — Progress Notes (Signed)
Cardiology Office Note   Date:  06/25/2015   ID:  Amber Wong, DOB May 20, 1956, MRN [id number]  PCP:  Imelda Pillow, NP  Cardiologist:  Dr Meredeth Ide, PA-C   History of Present Illness: Amber Wong is a 60 y.o. 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 Amber Wong had an episode of chest pain and is concerned about it. The pain was on her left side in the mid rib area. She wonders if it is her lymph nodes. The pain was sharp and was brief. There were no aggravating or alleviating factors. She did not take any medications for it. It resolved spontaneously. It was not exertional and has not returned.   She walks around the house and walks to her sister's house, but rarely gets out to exercise. She has dyspnea on exertion that has been there since surgery and has not changed recently. There is no significant lower extremity edema, no orthopnea and no PND.  She was to know if there are any cardiac limitations towards her going back to school and getting into the health care profession. She is also interested in doing a mission trip to [place] and wants to know if there are any cardiac limitations to her doing this.  She is interested in attending cardiac rehabilitation. She has no transportation and Medicaid would not cover it previously. If she does not qualify for cardiac rehabilitation by Medicaid standards, she wonders if [facility]   Past Medical History  Diagnosis Date  . Asthma   . Hypertension   . Sleep apnea   . Nocturia   . Depression   . Morbid obesity ([place])   . OSA (obstructive sleep apnea)     w/ hypoventilation  . Hypercholesteremia   . Obstructive sleep apnea (adult) (pediatric) 12/27/2012    AHI 17.7 baseline, titrated to 6 cm , AHi now  2.4 - compliance     Past Surgical History  Procedure Laterality Date  . Partial hysterectomy    . Knee surgery      chronic pain and limping since  . Cystectomy    . Breast lumpectomy Right     benign at age 66  . Tonsillectomy    . Coronary artery bypass graft N/A 10/20/2013    Procedure: CORONARY ARTERY BYPASS GRAFTING (CABG);  Surgeon: Ivin Poot, MD;  Location: [facility];  Service: Open Heart Surgery;  Laterality: N/A;  CABG x 3  . Intraoperative transesophageal echocardiogram N/A 10/20/2013    Procedure: INTRAOPERATIVE TRANSESOPHAGEAL ECHOCARDIOGRAM;  Surgeon: Ivin Poot, MD;  Location: [facility];  Service: Open Heart Surgery;  Laterality: N/A;  . Left heart catheterization with coronary angiogram N/A 10/16/2013    Procedure: LEFT HEART CATHETERIZATION WITH CORONARY ANGIOGRAM;  Surgeon: Troy Sine, MD;  [facility] C[facility] LAB;  Service: Cardiovascular;  Laterality: N/A;    Current Outpatient Prescriptions  Medication Sig Dispense Refill  . acyclovir (ZOVIRAX) 200 MG capsule Take 200 mg by mouth every morning.    Marland Kitchen albuterol (PROVENTIL HFA;VENTOLIN HFA) 108 (90 BASE) MCG/ACT inhaler Inhale 1 puff into the lungs 2 (two) times daily.    Marland Kitchen allopurinol (ZYLOPRIM) 100 MG tablet TAKE 1 TABLET (100 MG TOTAL) BY MOUTH DAILY. 30 tablet 5  . aspirin 81  MG tablet Take 81 mg by mouth daily.     Marland Kitchen atorvastatin (LIPITOR) 20 MG tablet Take 1 tablet (20 mg total) by mouth daily. 30 tablet 6  . buPROPion (WELLBUTRIN SR) 100 MG 12 hr tablet Take 100 mg by mouth 2 (two) times daily.    . cromolyn (OPTICROM) 4 % ophthalmic solution Place 1 drop into both eyes 4 (four) times daily.  2  . DUREZOL 0.05 % EMUL Place 1 drop into both eyes 2 (two) times daily.  1  . isosorbide mononitrate (IMDUR) 30 MG 24 hr tablet Take 1 tablet (30 mg total) by mouth daily. 30 tablet 6  . Ketotifen Fumarate (ALAWAY OP) Place 1 drop into both eyes daily as needed (for dry eyes).    Marland Kitchen lisinopril (PRINIVIL,ZESTRIL) 10 MG  tablet Take 10 mg by mouth daily.    . meloxicam (MOBIC) 15 MG tablet Take 15 mg by mouth daily as needed for pain.   2  . metoprolol tartrate (LOPRESSOR) 25 MG tablet TAKE 1 TABLET BY MOUTH TWICE A DAY 60 tablet 11  . nitroGLYCERIN (NITROSTAT) 0.4 MG SL tablet Place 1 tablet (0.4 mg total) under the tongue every 5 (five) minutes as needed for chest pain. 30 tablet 3  . pantoprazole (PROTONIX) 40 MG tablet Take 40 mg by mouth every morning.    Marland Kitchen PARoxetine (PAXIL) 20 MG tablet Take 20 mg by mouth every morning.    . Prenatal Vit-Fe Fumarate-FA (PRENATAL MULTIVITAMIN) TABS tablet Take 1 tablet by mouth daily at 12 noon.    . triamcinolone cream (KENALOG) 0.1 % Apply 1 application topically 2 (two) times daily.     No current facility-administered medications for this visit.    Allergies:   Iohexol; Penicillins; and Shellfish allergy    Social History:  The patient  reports that she has quit smoking. Her smoking use included Cigarettes. She quit after 15 years of use. She has never used smokeless tobacco. She reports that she does not drink alcohol or use illicit drugs.   Family History:  The patient's family history includes Cancer in her sister; Heart attack in her sister; Heart attack (age of onset: 65) in her mother; Heart attack (age of onset: 61) in her father.    ROS:  Please see the history of present illness. All other systems are reviewed and negative.    PHYSICAL EXAM: VS:  BP 150/90 mmHg  Pulse 66  Ht 5\' 8"  (1.727 m)  Wt 243 lb 3.2 oz (110.315 kg)  BMI 36.99 kg/m2 , BMI Body mass index is 36.99 kg/(m^2). GEN: Well nourished, well developed, female in no acute distress HEENT: normal for age  Neck: no JVD, no carotid bruit, no masses Cardiac: RRR; no murmur, no rubs, or gallops Respiratory:  clear to auscultation bilaterally, normal work of breathing.  No enlarged lymph nodes are noted in the clavicular or axillary areas. An area of her lateral ribs has some mild tenderness  to palpation but there is no crepitus noted. GI: soft, nontender, nondistended, + BS MS: no deformity or atrophy; no edema; distal pulses are 2+ in all 4 extremities  Skin: warm and dry, no rash; midline incision is well-healed Neuro:  Strength and sensation are intact Psych: euthymic mood, full affect   EKG:  EKG is ordered today. The ekg ordered today demonstrates sinus rhythm, rate 66 with no acute ischemic changes, normal intervals   Recent Labs: 03/25/2015: ALT 18; BUN 10; Creat 0.77; Hemoglobin 14.0; Platelets 275;  Potassium 3.8; Sodium 141; TSH 0.990    Lipid Panel    Component Value Date/Time   CHOL 143 03/25/2015 0847   TRIG 74 03/25/2015 0847   HDL 52 03/25/2015 0847   CHOLHDL 2.8 03/25/2015 0847   VLDL 15 03/25/2015 0847   LDLCALC 76 03/25/2015 0847     Wt Readings from Last 3 Encounters:  06/25/15 243 lb 3.2 oz (110.315 kg)  04/28/15 236 lb (107.049 kg)  03/25/15 233 lb (105.688 kg)     Other studies Reviewed: Additional studies/ records that were reviewed today include: Hospital records and office notes.  ASSESSMENT AND PLAN:  1.  Chest pain: Her symptoms are atypical and there is some chest wall tenderness. Tylenol as appropriate for the pain. No further cardiac testing is needed. There are no swollen lymph nodes in the area.  2. CAD: She does not meet Medicaid criteria for cardiac rehabilitation. The patient was advised that [facility] will transport to to the doctor's offices or for treatments, but will not take her to the gym. The patient is encouraged to increase her activity locally as she is able. I advised her that I felt she had some deconditioning, but there were no cardiac limitations on her activity as long as she increased gradually.  In regards to her going back to school, she is encouraged to do so. In regards to her going on a medical mission to [place], she is advised to make sure that she gets all recommended vaccinations and if she has any questions  or concerns about them, be sure and follow-up with [place] first.  3. Hyperlipidemia: She is to continue the statin and is encouraged to return for fasting liver function tests and a lipid profile prior to her appointment with Dr. Claiborne Billings.   Current medicines are reviewed at length with the patient today.  The patient does not have concerns regarding medicines.  The following changes have been made:  no change  Labs/ tests ordered today include:   Orders Placed This Encounter  Procedures  . Lipid panel  . Hepatic function panel  . EKG 12-Lead     Disposition:   FU with Dr. Claiborne Billings  Signed, Lenoard Aden  06/25/2015 11:14 AM    [facility] [address], [place], [place]  [address] Phone: [phone]; Fax: [phone]

## 2015-06-30 ENCOUNTER — Encounter: Payer: Self-pay | Admitting: *Deleted

## 2015-10-07 ENCOUNTER — Other Ambulatory Visit: Payer: Self-pay | Admitting: Cardiovascular Disease

## 2015-10-07 NOTE — Telephone Encounter (Signed)
Rx refill sent to pharmacy. 

## 2015-11-02 ENCOUNTER — Telehealth: Payer: Self-pay | Admitting: Neurology

## 2015-11-02 NOTE — Telephone Encounter (Signed)
I have not contacted pt since December.  I called pt to discuss this. If pt calls back, please advise pt that our office has not called her since December, but she is due for her yearly office visit with Dr. Brett Fairy and make that appt with the pt.  No answer, left a message asking pt to call me back.

## 2015-11-02 NOTE — Telephone Encounter (Addendum)
Patient is calling in to see why she received a call from Dr. Brett Fairy.  (did not see a call documented since December) Please call her.

## 2015-11-22 ENCOUNTER — Telehealth: Payer: Self-pay | Admitting: Cardiovascular Disease

## 2015-11-22 NOTE — Telephone Encounter (Signed)
Received incoming call from pt.  Pt c/o pain in the area of left upper chest above her breast. It has been occuring for the past 2 weeks off and on, once or twice a day, lasting less than 5 minutes. She took nitro last week and it went away for a couple days. She reports feeling a constant "heartburn" feeling. At this time she denies the CP, SOB, palpitations, n/v or sweating. She denies feeling a heaviness on her chest, denies left arm pain or jaw pain. She states she is taking her medications as prescribed. It was hard to get consistent information from the pt.  Pt scheduled with [facility] at [address] tomorrow, 11/23/15. Pt verbalized understanding to go to the emergency room or call 911 if the chest pain worsens and he develops other symptoms, such as SOB, nausea, vomiting, or sweating.  Will route to Dr Claiborne Billings as a Juluis Rainier.

## 2015-11-22 NOTE — Telephone Encounter (Signed)
Pt have been having chest pains and heart burn off and on for the last 2 weeks.. She takes Nitro,it goes away,but it keeps coming back. Pt says she needs to be seen please.Pt says she is having some now.

## 2015-11-22 NOTE — Progress Notes (Signed)
Cardiology Office Note    Date:  11/23/2015   ID:  Amber Wong, DOB September 23, 1955, MRN [id number]  PCP:  Amber Pillow, NP  Cardiologist:  Dr. Claiborne Billings  CC: Chest pain  History of Present Illness:  Amber Wong is a 60 y.o. female with a history of morbid obesity, HTN, HLD, CAD s/p CABG 10/2013, gout, GERD who presents to clinic for evaluation of chest pain.   She has a history of CAD s/p CABG (10/2013) w/ LIMA-LAD, SVG-OM, SVG-D1. She had a myoview in 07/2014 w/ no ischemia EF 67%.  She was seen by Rosaria Ferries PA-C on 06/25/15 for evaluation of chest pain. It was felt to be atypical as there was tenderness to palpation. No further ischemic evaluation was performed. Lipid panel was performed which showed LDL 73.   Today she presents to clinic for evaluation of chest pain. She has been chest pain x2 weeks. She has been taking SL NTG which does help but pain returns. SHe has been having chest pain almost everyday. Sometimes after she eats. Not related to exertion. This is different than the pain she had prior to CABG in 2015 which was like an "elephant sitting on her chest." No associated SOB. Sometimes it radiates into her jaw and shoulders. No LE edema, orthopnea or PND. She does notice some dizziness from time to time when she gets up real fast but no syncope. She does have some blood in her urine, which is chronic? Marland Kitchen No blood in stool.    Past Medical History  Diagnosis Date  . Asthma   . Hypertension   . Sleep apnea   . Nocturia   . Depression   . Morbid obesity ([place])   . OSA (obstructive sleep apnea)     w/ hypoventilation  . Hypercholesteremia   . Obstructive sleep apnea (adult) (pediatric) 12/27/2012    AHI 17.7 baseline, titrated to 6 cm , AHi now 2.4 - compliance     Past Surgical History  Procedure Laterality Date  . Partial hysterectomy    . Knee surgery      chronic pain and limping since  . Cystectomy    . Breast lumpectomy Right    benign at age 83  . Tonsillectomy    . Coronary artery bypass graft N/A 10/20/2013    Procedure: CORONARY ARTERY BYPASS GRAFTING (CABG);  Surgeon: Ivin Poot, MD;  Location: [place];  Service: Open Heart Surgery;  Laterality: N/A;  CABG x 3  . Intraoperative transesophageal echocardiogram N/A 10/20/2013    Procedure: INTRAOPERATIVE TRANSESOPHAGEAL ECHOCARDIOGRAM;  Surgeon: Ivin Poot, MD;  Location: [place];  Service: Open Heart Surgery;  Laterality: N/A;  . Left heart catheterization with coronary angiogram N/A 10/16/2013    Procedure: LEFT HEART CATHETERIZATION WITH CORONARY ANGIOGRAM;  Surgeon: Troy Sine, MD;  Location: [facility] CATH LAB;  Service: Cardiovascular;  Laterality: N/A;    Current Medications: Outpatient Prescriptions Prior to Visit  Medication Sig Dispense Refill  . acyclovir (ZOVIRAX) 200 MG capsule Take 200 mg by mouth every morning.    Marland Kitchen albuterol (PROVENTIL HFA;VENTOLIN HFA) 108 (90 BASE) MCG/ACT inhaler Inhale 1 puff into the lungs 2 (two) times daily.    Marland Kitchen allopurinol (ZYLOPRIM) 100 MG tablet TAKE 1 TABLET (100 MG TOTAL) BY MOUTH DAILY. 30 tablet 5  . aspirin 81 MG tablet Take 81 mg by mouth daily.     Marland Kitchen atorvastatin (LIPITOR) 20 MG tablet Take 1 tablet (20 mg total) by mouth daily.  30 tablet 6  . buPROPion (WELLBUTRIN SR) 100 MG 12 hr tablet Take 100 mg by mouth 2 (two) times daily.    . cromolyn (OPTICROM) 4 % ophthalmic solution Place 1 drop into both eyes 4 (four) times daily.  2  . DUREZOL 0.05 % EMUL Place 1 drop into both eyes 2 (two) times daily.  1  . isosorbide mononitrate (IMDUR) 30 MG 24 hr tablet TAKE 1 TABLET (30 MG TOTAL) BY MOUTH DAILY. 30 tablet 6  . Ketotifen Fumarate (ALAWAY OP) Place 1 drop into both eyes daily as needed (for dry eyes).    Marland Kitchen lisinopril (PRINIVIL,ZESTRIL) 10 MG tablet Take 10 mg by mouth daily.    . meloxicam (MOBIC) 15 MG tablet Take 15 mg by mouth daily as needed for pain.   2  . metoprolol tartrate (LOPRESSOR) 25 MG tablet TAKE 1  TABLET BY MOUTH TWICE A DAY 60 tablet 11  . nitroGLYCERIN (NITROSTAT) 0.4 MG SL tablet Place 1 tablet (0.4 mg total) under the tongue every 5 (five) minutes as needed for chest pain. 30 tablet 3  . PARoxetine (PAXIL) 20 MG tablet Take 20 mg by mouth every morning.    . Prenatal Vit-Fe Fumarate-FA (PRENATAL MULTIVITAMIN) TABS tablet Take 1 tablet by mouth daily at 12 noon.    . triamcinolone cream (KENALOG) 0.1 % Apply 1 application topically 2 (two) times daily.    . pantoprazole (PROTONIX) 40 MG tablet Take 40 mg by mouth every morning.     No facility-administered medications prior to visit.     Allergies:   Iohexol; Penicillins; and Shellfish allergy   Social History   Social History  . Marital Status: Legally Separated    Spouse Name: N/A  . Number of Children: N/A  . Years of Education: 12   Occupational History  .      history of shift work   Social History Main Topics  . Smoking status: Former Smoker -- 15 years    Types: Cigarettes  . Smokeless tobacco: Never Used     Comment: Quit 2005  . Alcohol Use: No  . Drug Use: No  . Sexual Activity: No   Other Topics Concern  . None   Social History Narrative   Patient lives at home alone.      Family History:  The patient's family history includes Cancer in her sister; Heart attack in her sister; Heart attack (age of onset: 38) in her mother; Heart attack (age of onset: 12) in her father.   ROS:   Please see the history of present illness.    ROS All other systems reviewed and are negative.   PHYSICAL EXAM:   VS:  BP 138/84 mmHg  Pulse 67  Ht 5\' 8"  (1.727 m)  Wt 241 lb 6.4 oz (109.498 kg)  BMI 36.71 kg/m2  SpO2 96%   GEN: Well nourished, well developed, in no acute distressobese HEENT: normal Neck: no JVD, carotid bruits, or masses Cardiac: RRR; no murmurs, rubs, or gallops,no edema  Respiratory:  clear to auscultation bilaterally, normal work of breathing GI: soft, nontender, nondistended, + BS MS: no  deformity or atrophy Skin: warm and dry, no rash Neuro:  Alert and Oriented x 3, Strength and sensation are intact Psych: euthymic mood, full affect  Wt Readings from Last 3 Encounters:  11/23/15 241 lb 6.4 oz (109.498 kg)  06/25/15 243 lb 3.2 oz (110.315 kg)  04/28/15 236 lb (107.049 kg)      Studies/Labs Reviewed:  EKG:  EKG is ordered today.  The ekg ordered today demonstrates NSR, low voltage QRS HR 67  Recent Labs: 03/25/2015: BUN 10; Creat 0.77; Hemoglobin 14.0; Platelets 275; Potassium 3.8; Sodium 141; TSH 0.990 06/25/2015: ALT 18   Lipid Panel    Component Value Date/Time   CHOL 140 06/25/2015 0958   TRIG 73 06/25/2015 0958   HDL 52 06/25/2015 0958   CHOLHDL 2.7 06/25/2015 0958   VLDL 15 06/25/2015 0958   LDLCALC 73 06/25/2015 0958    Additional studies/ records that were reviewed today include:  2D ECHO: 10/17/2013 LV EF: 55% -  60% Study Conclusions - Left ventricle: The cavity size was normal. Wall thickness was normal. Systolic function was normal. The estimated ejection fraction was in the range of 55% to 60%. Wall motion was normal; there were no regional wall motion abnormalities. Doppler parameters are consistent with abnormal left ventricular relaxation (grade 1 diastolic dysfunction). - Pulmonary arteries: PA peak pressure: 10mm Hg (S).  Myoview 03/2015 Study Highlights     Nuclear stress EF: 67%.  The left ventricular ejection fraction is hyperdynamic (>65%).  There was no ST segment deviation noted during stress.  The study is normal.  Normal stress nuclear study with no ischemia or infarction; EF 67 with normal wall motion.      ASSESSMENT & PLAN:   Chest pain: atypical but is relieved by SL NTG. Will plan for lexiscan myoview. Also sometimes related to meals. Will increase protonix to 40mg  BID  CAD s/p CABG x3V (2015): continue ASA, statin and BB as well as imdur 30mg  daily  HLD: recent lipid panel w/ LDL close  to goal ~70  HTN: BP well controlled on current regimen  Gout: continue allopurinol  GERD: continue protonix but will increase to BID dosing as above  Medication Adjustments/Labs and Tests Ordered: Current medicines are reviewed at length with the patient today.  Concerns regarding medicines are outlined above.  Medication changes, Labs and Tests ordered today are listed in the Patient Instructions below. Patient Instructions  Medication Instructions:  Your physician has recommended you make the following change in your medication:  1.  INCREASE the Protonix to 40 mg taking 1 tablet twice a day   Labwork: None ordered  Testing/Procedures: Your physician has requested that you have a lexiscan myoview. For further information please visit [email or web address]. Please follow instruction sheet, as given.    Follow-Up: Your physician recommends that you schedule a follow-up appointment in: AS PLANNED WITH DR. Claiborne Billings   Any Other Special Instructions Will Be Listed Below (If Applicable).  Pharmacologic Stress Electrocardiogram A pharmacologic stress electrocardiogram is a heart (cardiac) test that uses nuclear imaging to evaluate the blood supply to your heart. This test may also be called a pharmacologic stress electrocardiography. Pharmacologic means that a medicine is used to increase your heart rate and blood pressure.  This stress test is done to find areas of poor blood flow to the heart by determining the extent of coronary artery disease (CAD). Some people exercise on a treadmill, which naturally increases the blood flow to the heart. For those people unable to exercise on a treadmill, a medicine is used. This medicine stimulates your heart and will cause your heart to beat harder and more quickly, as if you were exercising.  Pharmacologic stress tests can help determine:  The adequacy of blood flow to your heart during increased levels of activity in order to clear you for  discharge home.  The extent of coronary  artery blockage caused by CAD.  Your prognosis if you have suffered a heart attack.  The effectiveness of cardiac procedures done, such as an angioplasty, which can increase the circulation in your coronary arteries.  Causes of chest pain or pressure. LET [facility] CARE PROVIDER KNOW ABOUT:  Any allergies you have.  All medicines you are taking, including vitamins, herbs, eye drops, creams, and over-the-counter medicines.  Previous problems you or members of your family have had with the use of anesthetics.  Any blood disorders you have.  Previous surgeries you have had.  Medical conditions you have.  Possibility of pregnancy, if this applies.  If you are currently breastfeeding. RISKS AND COMPLICATIONS Generally, this is a safe procedure. However, as with any procedure, complications can occur. Possible complications include:  You develop pain or pressure in the following areas:  Chest.  Jaw or neck.  Between your shoulder blades.  Radiating down your left arm.  Headache.  Dizziness or light-headedness.  Shortness of breath.  Increased or irregular heartbeat.  Low blood pressure.  Nausea or vomiting.  Flushing.  Redness going up the arm and slight pain during injection of medicine.  Heart attack (rare). BEFORE THE PROCEDURE   Avoid all forms of caffeine for 24 hours before your test or as directed by your health care provider. This includes coffee, tea (even decaffeinated tea), caffeinated sodas, chocolate, cocoa, and certain pain medicines.  Follow your health care provider's instructions regarding eating and drinking before the test.  Take your medicines as directed at regular times with water unless instructed otherwise. Exceptions may include:  If you have diabetes, ask how you are to take your insulin or pills. It is common to adjust insulin dosing the morning of the test.  If you are taking beta-blocker  medicines, it is important to talk to your health care provider about these medicines well before the date of your test. Taking beta-blocker medicines may interfere with the test. In some cases, these medicines need to be changed or stopped 24 hours or more before the test.  If you wear a nitroglycerin patch, it may need to be removed prior to the test. Ask your health care provider if the patch should be removed before the test.  If you use an inhaler for any breathing condition, bring it with you to the test.  If you are an outpatient, bring a snack so you can eat right after the stress phase of the test.  Do not smoke for 4 hours prior to the test or as directed by your health care provider.  Do not apply lotions, powders, creams, or oils on your chest prior to the test.  Wear comfortable shoes and clothing. Let your health care provider know if you were unable to complete or follow the preparations for your test. PROCEDURE   Multiple patches (electrodes) will be put on your chest. If needed, small areas of your chest may be shaved to get better contact with the electrodes. Once the electrodes are attached to your body, multiple wires will be attached to the electrodes, and your heart rate will be monitored.  An IV access will be started. A nuclear trace (isotope) is given. The isotope may be given intravenously, or it may be swallowed. Nuclear refers to several types of radioactive isotopes, and the nuclear isotope lights up the arteries so that the nuclear images are clear. The isotope is absorbed by your body. This results in low radiation exposure.  A resting nuclear  image is taken to show how your heart functions at rest.  A medicine is given through the IV access.  A second scan is done about 1 hour after the medicine injection and determines how your heart functions under stress.  During this stress phase, you will be connected to an electrocardiogram machine. Your blood pressure  and oxygen levels will be monitored. AFTER THE PROCEDURE   Your heart rate and blood pressure will be monitored after the test.  You may return to your normal schedule, including diet,activities, and medicines, unless your health care provider tells you otherwise.   This information is not intended to replace advice given to you by your health care provider. Make sure you discuss any questions you have with your health care provider.   Document Released: 10/15/2008 Document Revised: 06/03/2013 Document Reviewed: 02/03/2013 Elsevier Interactive Patient Education [employer].    If you need a refill on your cardiac medications before your next appointment, please call your pharmacy.       Signed, Angelena Form, PA-C  11/23/2015 11:18 AM    [facility] [place], [place], [place]  [address] Phone: [phone]; Fax: [phone]

## 2015-11-23 ENCOUNTER — Ambulatory Visit (INDEPENDENT_AMBULATORY_CARE_PROVIDER_SITE_OTHER): Payer: Medicaid Other | Admitting: Physician Assistant

## 2015-11-23 ENCOUNTER — Encounter: Payer: Self-pay | Admitting: Physician Assistant

## 2015-11-23 VITALS — BP 138/84 | HR 67 | Ht 68.0 in | Wt 241.4 lb

## 2015-11-23 DIAGNOSIS — I10 Essential (primary) hypertension: Secondary | ICD-10-CM

## 2015-11-23 DIAGNOSIS — I2583 Coronary atherosclerosis due to lipid rich plaque: Principal | ICD-10-CM

## 2015-11-23 DIAGNOSIS — E785 Hyperlipidemia, unspecified: Secondary | ICD-10-CM | POA: Diagnosis not present

## 2015-11-23 DIAGNOSIS — I2510 Atherosclerotic heart disease of native coronary artery without angina pectoris: Secondary | ICD-10-CM | POA: Diagnosis not present

## 2015-11-23 DIAGNOSIS — R079 Chest pain, unspecified: Secondary | ICD-10-CM | POA: Diagnosis not present

## 2015-11-23 MED ORDER — PANTOPRAZOLE SODIUM 40 MG PO TBEC: 40.0000 mg | DELAYED_RELEASE_TABLET | Freq: Two times a day (BID) | ORAL | Status: DC

## 2015-11-23 NOTE — Patient Instructions (Signed)
Medication Instructions:  Your physician has recommended you make the following change in your medication:  1.  INCREASE the Protonix to 40 mg taking 1 tablet twice a day   Labwork: None ordered  Testing/Procedures: Your physician has requested that you have a lexiscan myoview. For further information please visit [email or web address]. Please follow instruction sheet, as given.    Follow-Up: Your physician recommends that you schedule a follow-up appointment in: AS PLANNED WITH DR. Claiborne Billings   Any Other Special Instructions Will Be Listed Below (If Applicable).  Pharmacologic Stress Electrocardiogram A pharmacologic stress electrocardiogram is a heart (cardiac) test that uses nuclear imaging to evaluate the blood supply to your heart. This test may also be called a pharmacologic stress electrocardiography. Pharmacologic means that a medicine is used to increase your heart rate and blood pressure.  This stress test is done to find areas of poor blood flow to the heart by determining the extent of coronary artery disease (CAD). Some people exercise on a treadmill, which naturally increases the blood flow to the heart. For those people unable to exercise on a treadmill, a medicine is used. This medicine stimulates your heart and will cause your heart to beat harder and more quickly, as if you were exercising.  Pharmacologic stress tests can help determine:  The adequacy of blood flow to your heart during increased levels of activity in order to clear you for discharge home.  The extent of coronary artery blockage caused by CAD.  Your prognosis if you have suffered a heart attack.  The effectiveness of cardiac procedures done, such as an angioplasty, which can increase the circulation in your coronary arteries.  Causes of chest pain or pressure. LET [facility] CARE PROVIDER KNOW ABOUT:  Any allergies you have.  All medicines you are taking, including vitamins, herbs, eye drops, creams,  and over-the-counter medicines.  Previous problems you or members of your family have had with the use of anesthetics.  Any blood disorders you have.  Previous surgeries you have had.  Medical conditions you have.  Possibility of pregnancy, if this applies.  If you are currently breastfeeding. RISKS AND COMPLICATIONS Generally, this is a safe procedure. However, as with any procedure, complications can occur. Possible complications include:  You develop pain or pressure in the following areas:  Chest.  Jaw or neck.  Between your shoulder blades.  Radiating down your left arm.  Headache.  Dizziness or light-headedness.  Shortness of breath.  Increased or irregular heartbeat.  Low blood pressure.  Nausea or vomiting.  Flushing.  Redness going up the arm and slight pain during injection of medicine.  Heart attack (rare). BEFORE THE PROCEDURE   Avoid all forms of caffeine for 24 hours before your test or as directed by your health care provider. This includes coffee, tea (even decaffeinated tea), caffeinated sodas, chocolate, cocoa, and certain pain medicines.  Follow your health care provider's instructions regarding eating and drinking before the test.  Take your medicines as directed at regular times with water unless instructed otherwise. Exceptions may include:  If you have diabetes, ask how you are to take your insulin or pills. It is common to adjust insulin dosing the morning of the test.  If you are taking beta-blocker medicines, it is important to talk to your health care provider about these medicines well before the date of your test. Taking beta-blocker medicines may interfere with the test. In some cases, these medicines need to be changed or stopped 24 hours or more  before the test.  If you wear a nitroglycerin patch, it may need to be removed prior to the test. Ask your health care provider if the patch should be removed before the test.  If you  use an inhaler for any breathing condition, bring it with you to the test.  If you are an outpatient, bring a snack so you can eat right after the stress phase of the test.  Do not smoke for 4 hours prior to the test or as directed by your health care provider.  Do not apply lotions, powders, creams, or oils on your chest prior to the test.  Wear comfortable shoes and clothing. Let your health care provider know if you were unable to complete or follow the preparations for your test. PROCEDURE   Multiple patches (electrodes) will be put on your chest. If needed, small areas of your chest may be shaved to get better contact with the electrodes. Once the electrodes are attached to your body, multiple wires will be attached to the electrodes, and your heart rate will be monitored.  An IV access will be started. A nuclear trace (isotope) is given. The isotope may be given intravenously, or it may be swallowed. Nuclear refers to several types of radioactive isotopes, and the nuclear isotope lights up the arteries so that the nuclear images are clear. The isotope is absorbed by your body. This results in low radiation exposure.  A resting nuclear image is taken to show how your heart functions at rest.  A medicine is given through the IV access.  A second scan is done about 1 hour after the medicine injection and determines how your heart functions under stress.  During this stress phase, you will be connected to an electrocardiogram machine. Your blood pressure and oxygen levels will be monitored. AFTER THE PROCEDURE   Your heart rate and blood pressure will be monitored after the test.  You may return to your normal schedule, including diet,activities, and medicines, unless your health care provider tells you otherwise.   This information is not intended to replace advice given to you by your health care provider. Make sure you discuss any questions you have with your health care  provider.   Document Released: 10/15/2008 Document Revised: 06/03/2013 Document Reviewed: 02/03/2013 Elsevier Interactive Patient Education [employer].    If you need a refill on your cardiac medications before your next appointment, please call your pharmacy.

## 2015-12-03 ENCOUNTER — Ambulatory Visit: Payer: Medicaid Other | Admitting: Physician Assistant

## 2015-12-06 ENCOUNTER — Telehealth (HOSPITAL_COMMUNITY): Payer: Self-pay | Admitting: *Deleted

## 2015-12-06 NOTE — Telephone Encounter (Signed)
Attempted to call patient regarding upcoming appointment- no answer. Oneil Behney J Curtis Uriarte, RN 

## 2015-12-08 ENCOUNTER — Ambulatory Visit (HOSPITAL_COMMUNITY): Payer: Medicaid Other

## 2015-12-09 ENCOUNTER — Ambulatory Visit (HOSPITAL_COMMUNITY): Payer: Medicaid Other

## 2016-01-10 ENCOUNTER — Telehealth (HOSPITAL_COMMUNITY): Payer: Self-pay | Admitting: *Deleted

## 2016-01-10 NOTE — Telephone Encounter (Signed)
Left message on voicemail per DPR in reference to upcoming appointment scheduled on 01/12/16 at 12:30 with detailed instructions given per Myocardial Perfusion Study Information Sheet for the test. LM to arrive 15 minutes early, and that it is imperative to arrive on time for appointment to keep from having the test rescheduled. If you need to cancel or reschedule your appointment, please call the office within 24 hours of your appointment. Failure to do so may result in a cancellation of your appointment, and a $50 no show fee. Phone number given for call back for any questions. Amber Wong

## 2016-01-12 ENCOUNTER — Ambulatory Visit (HOSPITAL_COMMUNITY): Payer: Medicaid Other | Attending: Cardiovascular Disease

## 2016-01-12 DIAGNOSIS — R0789 Other chest pain: Secondary | ICD-10-CM | POA: Diagnosis not present

## 2016-01-12 DIAGNOSIS — R42 Dizziness and giddiness: Secondary | ICD-10-CM | POA: Diagnosis not present

## 2016-01-12 DIAGNOSIS — I10 Essential (primary) hypertension: Secondary | ICD-10-CM | POA: Diagnosis not present

## 2016-01-12 DIAGNOSIS — R0609 Other forms of dyspnea: Secondary | ICD-10-CM | POA: Insufficient documentation

## 2016-01-12 DIAGNOSIS — R079 Chest pain, unspecified: Secondary | ICD-10-CM | POA: Diagnosis not present

## 2016-01-12 MED ORDER — REGADENOSON 0.4 MG/5ML IV SOLN
0.4000 mg | Freq: Once | INTRAVENOUS | Status: AC
  Administered 2016-01-12: 0.4 mg via INTRAVENOUS

## 2016-01-12 MED ORDER — TECHNETIUM TC 99M TETROFOSMIN IV KIT
32.9000 | PACK | Freq: Once | INTRAVENOUS | Status: AC | PRN
  Administered 2016-01-12: 32.9 via INTRAVENOUS
  Filled 2016-01-12: qty 33

## 2016-01-13 ENCOUNTER — Ambulatory Visit (HOSPITAL_COMMUNITY): Payer: Medicaid Other | Attending: Cardiology

## 2016-01-13 LAB — MYOCARDIAL PERFUSION IMAGING
CHL CUP NUCLEAR SDS: 3
CSEPPHR: 94 {beats}/min
LVDIAVOL: 71 mL (ref 46–106)
LVSYSVOL: 23 mL
RATE: 0.27
Rest HR: 64 {beats}/min
SRS: 5
SSS: 8
TID: 0.92

## 2016-01-13 MED ORDER — TECHNETIUM TC 99M TETROFOSMIN IV KIT
31.6000 | PACK | Freq: Once | INTRAVENOUS | Status: AC | PRN
Start: 2016-01-13 — End: 2016-01-13
  Administered 2016-01-13: 32 via INTRAVENOUS
  Filled 2016-01-13: qty 32

## 2016-01-24 ENCOUNTER — Telehealth: Payer: Self-pay | Admitting: Cardiovascular Disease

## 2016-01-24 NOTE — Telephone Encounter (Signed)
Returned patient call. Advised no restrictions on exercise per provider recommendation.  Informed her that if [employer] or chosen gym needs to get a statement of clearance from [place] they may call or fax. I have given her fax number. Pt voiced understanding and thanks.

## 2016-01-24 NOTE — Telephone Encounter (Signed)
Returned call to patient. She voiced thanks for results of recent stress test. Also asked if she can participate in any exercise programs - states she needs guidelines on what she can do besides walking. She had expressed interest in joining gym [facility]) and had filled out application with them but never heard back. States she may need clearance by cardiology to participate in exercise programs. Will route for recommendations.

## 2016-01-24 NOTE — Telephone Encounter (Signed)
Please let her know she can work out at [employer] with no restrictions.

## 2016-01-24 NOTE — Telephone Encounter (Signed)
New message      Pt wants the results of her stress test. Please call.

## 2016-02-18 ENCOUNTER — Emergency Department (HOSPITAL_COMMUNITY)
Admission: EM | Admit: 2016-02-18 | Discharge: 2016-02-18 | Disposition: A | Discharge status: Home / Self Care | Payer: Medicaid Other | Attending: Emergency Medicine | Admitting: Emergency Medicine

## 2016-02-18 ENCOUNTER — Emergency Department (HOSPITAL_COMMUNITY): Payer: Medicaid Other

## 2016-02-18 ENCOUNTER — Encounter (HOSPITAL_COMMUNITY): Payer: Self-pay | Admitting: Emergency Medicine

## 2016-02-18 DIAGNOSIS — R0789 Other chest pain: Secondary | ICD-10-CM | POA: Diagnosis present

## 2016-02-18 DIAGNOSIS — E876 Hypokalemia: Secondary | ICD-10-CM | POA: Diagnosis not present

## 2016-02-18 DIAGNOSIS — Z7982 Long term (current) use of aspirin: Secondary | ICD-10-CM | POA: Diagnosis not present

## 2016-02-18 DIAGNOSIS — J449 Chronic obstructive pulmonary disease, unspecified: Secondary | ICD-10-CM | POA: Insufficient documentation

## 2016-02-18 DIAGNOSIS — I5030 Unspecified diastolic (congestive) heart failure: Secondary | ICD-10-CM | POA: Insufficient documentation

## 2016-02-18 DIAGNOSIS — Z7951 Long term (current) use of inhaled steroids: Secondary | ICD-10-CM | POA: Insufficient documentation

## 2016-02-18 DIAGNOSIS — J45909 Unspecified asthma, uncomplicated: Secondary | ICD-10-CM | POA: Diagnosis not present

## 2016-02-18 DIAGNOSIS — Z79899 Other long term (current) drug therapy: Secondary | ICD-10-CM | POA: Insufficient documentation

## 2016-02-18 DIAGNOSIS — I110 Hypertensive heart disease with heart failure: Secondary | ICD-10-CM | POA: Insufficient documentation

## 2016-02-18 DIAGNOSIS — I2510 Atherosclerotic heart disease of native coronary artery without angina pectoris: Secondary | ICD-10-CM | POA: Insufficient documentation

## 2016-02-18 DIAGNOSIS — Z87891 Personal history of nicotine dependence: Secondary | ICD-10-CM | POA: Diagnosis not present

## 2016-02-18 LAB — BASIC METABOLIC PANEL
ANION GAP: 8 (ref 5–15)
BUN: 11 mg/dL (ref 6–20)
CO2: 27 mmol/L (ref 22–32)
Calcium: 9.3 mg/dL (ref 8.9–10.3)
Chloride: 105 mmol/L (ref 101–111)
Creatinine, Ser: 1.04 mg/dL — ABNORMAL HIGH (ref 0.44–1.00)
GFR calc Af Amer: >60 mL/min (ref >60)
GFR calc non Af Amer: 58 mL/min — ABNORMAL LOW (ref >60)
GLUCOSE: 81 mg/dL (ref 65–99)
Potassium: 3.1 mmol/L — ABNORMAL LOW (ref 3.5–5.1)
Sodium: 140 mmol/L (ref 135–145)

## 2016-02-18 LAB — I-STAT TROPONIN, ED
TROPONIN I, POC: 0 ng/mL (ref 0.00–0.08)
Troponin i, poc: 0 ng/mL (ref 0.00–0.08)

## 2016-02-18 LAB — CBC
HEMATOCRIT: 43.5 % (ref 36.0–46.0)
HEMOGLOBIN: 15 g/dL (ref 12.0–15.0)
MCH: 30.4 pg (ref 26.0–34.0)
MCHC: 34.5 g/dL (ref 30.0–36.0)
MCV: 88.1 fL (ref 78.0–100.0)
Platelets: 262 10*3/uL (ref 150–400)
RBC: 4.94 MIL/uL (ref 3.87–5.11)
RDW: 14.6 % (ref 11.5–15.5)
WBC: 7 10*3/uL (ref 4.0–10.5)

## 2016-02-18 MED ORDER — POTASSIUM CHLORIDE CRYS ER 20 MEQ PO TBCR
40.0000 meq | EXTENDED_RELEASE_TABLET | Freq: Once | ORAL | Status: AC
  Administered 2016-02-18: 40 meq via ORAL
  Filled 2016-02-18: qty 2

## 2016-02-18 NOTE — ED Triage Notes (Signed)
Pt presents to the ED with complaints of chest and jaw pain. Pt states the jaw pain started a week ago and the chest pain started a few days ago. Pt states she took a nitroglycerin night before last and woke up the next morning feeling numbness on both sides of her face. Pt states she is currently feeling chest pain but denies taking nitroglycerin today. Has hx of hypertension. Non smoker. Reports pain in chest, jaw, head, and left arm. Rating pain 6/10.

## 2016-02-18 NOTE — ED Provider Notes (Signed)
[facility] DEPT Provider Note   CSN: [id number] Arrival date & time: 02/18/16  1434     History   Chief Complaint Chief Complaint  Patient presents with  . Chest Pain    HPI Amber Wong is a 60 y.o. female.complains of chest pain onset 3 days ago pain intermittent last 15 minutes at a time she gets pain 2 or 3 times per day. Pain is sometimes at left anterior chest sometimes at right posterior chest no associated nausea shortness of breath or sweatiness or lightheadedness.she feels the pain is brought on by stress. She has been able to walk 5 blocks over the past 3 days without any chest discomfort or jaw discomfort She is presently asymptomatic. She's also had jaw pain onset 2 days ago. Jaw pain does not occur + time as chest pain. She's treated herself with ublingualnitroglycerin and with drinking vinegar with partial relief.presently asymptomatic.patient had normal resting and stress perfusion nuclear stress test 01/12/2016 with ejection fraction 67%  HPI  Past Medical History:  Diagnosis Date  . Asthma   . Depression   . Hypercholesteremia   . Hypertension   . Morbid obesity ([place])   . Nocturia   . Obstructive sleep apnea (adult) (pediatric) 12/27/2012   AHI 17.7 baseline, titrated to 6 cm , AHi now 2.4 - compliance   . OSA (obstructive sleep apnea)    w/ hypoventilation  . Sleep apnea     Patient Active Problem List   Diagnosis Date Noted  . Hyperlipidemia with target LDL less than 70 03/19/2015  . Hyperlipidemia LDL goal <70 03/19/2015  . Left leg pain 03/25/2014  . Gout- Lt great toe 03/25/2014  . CAD (coronary artery disease) 10/20/2013  . Diastolic CHF ([place]) 99991111  . COPD exacerbation ([place]) 10/17/2013  . HLD (hyperlipidemia) 10/17/2013  . Unstable angina ([place]) 10/16/2013  . Obesity 10/16/2013  . Hypokalemia 10/16/2013  . Chest pain 10/15/2013  . Nocturnal hypoxemia 03/28/2013  . Obstructive sleep apnea 12/27/2012  . VITAMIN D DEFICIENCY  02/21/2010  . ELEVATED SEDIMENTATION RATE 02/21/2010  . FATTY LIVER DISEASE 01/14/2010  . FATIGUE 12/17/2009  . PLEURAL EFFUSION 09/02/2009  . BENIGN NEOPLASM OF ADRENAL GLAND 09/01/2009  . HEMATURIA UNSPECIFIED 09/01/2009  . Nonspecific (abnormal) findings on radiological and other examination of body structure 09/01/2009  . Personal history of other endocrine, metabolic, and immunity disorders 09/01/2009  . CT, CHEST, ABNORMAL 09/01/2009  . ACUTE BRONCHITIS 04/27/2009  . ACUTE CYSTITIS 03/24/2009  . COUGH 03/24/2009  . ULNAR NEUROPATHY, BILATERAL 01/04/2009  . HEADACHE 01/04/2009  . CHEST PAIN UNSPECIFIED 01/04/2009  . MUSCLE SPASM 11/16/2008  . DENTAL CARIES 11/06/2007  . HYPERLIPIDEMIA 09/01/2007  . DYSPNEA ON EXERTION 08/27/2007  . CARPAL TUNNEL SYNDROME 05/14/2007  . SLEEP APNEA 04/09/2007  . ANXIETY 02/11/2007  . ABUSE, ALCOHOL, IN REMISSION 02/11/2007  . PERIPHERAL NEUROPATHY 02/11/2007  . ALLERGIC RHINITIS 02/11/2007  . GERD 02/11/2007  . OSTEOPENIA 02/11/2007  . HSV 02/05/2007  . DEPRESSION 02/05/2007  . Essential hypertension 02/05/2007  . HEMATURIA 02/05/2007    Past Surgical History:  Procedure Laterality Date  . BREAST LUMPECTOMY Right    benign at age 20  . CORONARY ARTERY BYPASS GRAFT N/A 10/20/2013   Procedure: CORONARY ARTERY BYPASS GRAFTING (CABG);  Surgeon: Ivin Poot, MD;  Location: [facility];  Service: Open Heart Surgery;  Laterality: N/A;  CABG x 3  . CYSTECTOMY    . INTRAOPERATIVE TRANSESOPHAGEAL ECHOCARDIOGRAM N/A 10/20/2013   Procedure: INTRAOPERATIVE TRANSESOPHAGEAL ECHOCARDIOGRAM;  Surgeon:  Ivin Poot, MD;  Location: [facility];  Service: Open Heart Surgery;  Laterality: N/A;  . KNEE SURGERY     chronic pain and limping since  . LEFT HEART CATHETERIZATION WITH CORONARY ANGIOGRAM N/A 10/16/2013   Procedure: LEFT HEART CATHETERIZATION WITH CORONARY ANGIOGRAM;  Surgeon: Troy Sine, MD;  Location: [facility] C[facility] LAB;  Service: Cardiovascular;   Laterality: N/A;  . PARTIAL HYSTERECTOMY    . TONSILLECTOMY      OB History    No data available       Home Medications    Prior to Admission medications   Medication Sig Start Date End Date Taking? Authorizing Provider  acyclovir (ZOVIRAX) 200 MG capsule Take 200 mg by mouth every morning.    Historical Provider, MD  albuterol (PROVENTIL HFA;VENTOLIN HFA) 108 (90 BASE) MCG/ACT inhaler Inhale 1 puff into the lungs 2 (two) times daily.    Historical Provider, MD  allopurinol (ZYLOPRIM) 100 MG tablet TAKE 1 TABLET (100 MG TOTAL) BY MOUTH DAILY. 06/18/14   Isaiah Serge, NP  aspirin 81 MG tablet Take 81 mg by mouth daily.     Historical Provider, MD  atorvastatin (LIPITOR) 20 MG tablet Take 1 tablet (20 mg total) by mouth daily. 06/25/15   Troy Sine, MD  buPROPion (WELLBUTRIN SR) 100 MG 12 hr tablet Take 100 mg by mouth 2 (two) times daily.    Historical Provider, MD  buPROPion (WELLBUTRIN) 100 MG tablet Take 100 mg by mouth 2 (two) times daily. 10/24/15   Historical Provider, MD  cromolyn (OPTICROM) 4 % ophthalmic solution Place 1 drop into both eyes 4 (four) times daily. 06/16/15   Historical Provider, MD  DUREZOL 0.05 % EMUL Place 1 drop into both eyes 2 (two) times daily. 06/04/15   Historical Provider, MD  fluconazole (DIFLUCAN) 150 MG tablet Take 1 tablet by mouth daily as needed. When yeast infection is present 09/17/15   Historical Provider, MD  isosorbide mononitrate (IMDUR) 30 MG 24 hr tablet TAKE 1 TABLET (30 MG TOTAL) BY MOUTH DAILY. 10/07/15   Troy Sine, MD  Ketotifen Fumarate (ALAWAY OP) Place 1 drop into both eyes daily as needed (for dry eyes).    Historical Provider, MD  lisinopril (PRINIVIL,ZESTRIL) 10 MG tablet Take 10 mg by mouth daily.    Historical Provider, MD  meloxicam (MOBIC) 15 MG tablet Take 15 mg by mouth daily as needed for pain.  02/19/15   Historical Provider, MD  metoprolol tartrate (LOPRESSOR) 25 MG tablet TAKE 1 TABLET BY MOUTH TWICE A DAY 06/15/14   Troy Sine, MD  metroNIDAZOLE (FLAGYL) 500 MG tablet Take 1 tablet by mouth daily. 09/17/15   Historical Provider, MD  nitroGLYCERIN (NITROSTAT) 0.4 MG SL tablet Place 1 tablet (0.4 mg total) under the tongue every 5 (five) minutes as needed for chest pain. 03/17/15   Troy Sine, MD  pantoprazole (PROTONIX) 40 MG tablet Take 1 tablet (40 mg total) by mouth 2 (two) times daily. 11/23/15   Eileen Stanford, PA-C  PARoxetine (PAXIL) 20 MG tablet Take 20 mg by mouth every morning.    Historical Provider, MD  Prenatal Vit-Fe Fumarate-FA (PRENATAL MULTIVITAMIN) TABS tablet Take 1 tablet by mouth daily at 12 noon.    Historical Provider, MD  triamcinolone cream (KENALOG) 0.1 % Apply 1 application topically 2 (two) times daily.    Historical Provider, MD    Family History Family History  Problem Relation Age of Onset  . Heart  attack Mother 75    died of MI  . Heart attack Father 25    died of MI  . Cancer Sister     double mastectomy  . Heart attack Sister     2 sisters in their 32's with MI/stents    Social History Social History  Substance Use Topics  . Smoking status: Former Smoker    Years: 15.00    Types: Cigarettes  . Smokeless tobacco: Never Used     Comment: Quit 2005  . Alcohol use No  no illicit drug use  Allergies   Iohexol; Penicillins; and Shellfish allergy   Review of Systems Review of Systems  Constitutional: Negative.   HENT: Negative.        JAw pain  Respiratory: Negative.   Cardiovascular: Positive for chest pain.  Gastrointestinal: Negative.   Musculoskeletal: Positive for gait problem.       Walks with cane  Skin: Negative.   Psychiatric/Behavioral: Negative.   All other systems reviewed and are negative.    Physical Exam Updated Vital Signs BP 141/81 (BP Location: Left Arm)   Pulse 60   Temp 98.3 F (36.8 C) (Oral)   Resp 20   Ht 5\' 9"  (1.753 m)   Wt 240 lb (108.9 kg)   SpO2 96%   BMI 35.44 kg/m   Physical Exam  Constitutional: She  appears well-developed and well-nourished. No distress.  HENT:  Head: Normocephalic and atraumatic.  Eyes: Conjunctivae are normal. Pupils are equal, round, and reactive to light.  Neck: Neck supple. No tracheal deviation present. No thyromegaly present.  Cardiovascular: Normal rate and regular rhythm.   No murmur heard. Pulmonary/Chest: Effort normal and breath sounds normal.  Abdominal: Soft. Bowel sounds are normal. She exhibits no distension. There is no tenderness.  obese  Musculoskeletal: Normal range of motion. She exhibits no edema or tenderness.  Neurological: She is alert. Coordination normal.  Skin: Skin is warm and dry. No rash noted.  Psychiatric: She has a normal mood and affect.  Nursing note and vitals reviewed.    ED Treatments / Results  Labs (all labs ordered are listed, but only abnormal results are displayed) Labs Reviewed  BASIC METABOLIC PANEL - Abnormal; Notable for the following:       Result Value   Potassium 3.1 (*)    Creatinine, Ser 1.04 (*)    GFR calc non Af Amer 58 (*)    All other components within normal limits  CBC  I-STAT TROPOININ, ED  I-STAT TROPOININ, ED    EKG  EKG Interpretation  Date/Time:  Friday February 18 2016 14:51:14 EDT Ventricular Rate:  65 PR Interval:    QRS Duration: 92 QT Interval:  407 QTC Calculation: 424 R Axis:   28 Text Interpretation:  Sinus rhythm Low voltage, extremity and precordial leads Nonspecific T abnrm, anterolateral leads , new since last tracing baseline artifact Confirmed by KNAPP  MD-J, JON [id number]) on 02/18/2016 3:26:06 PM       Radiology Dg Chest 2 View  Result Date: 02/18/2016 CLINICAL DATA:  Acute presentation with chest in jaw pain beginning 1 week ago an worsening. EXAM: CHEST  2 VIEW COMPARISON:  06/26/2014 FINDINGS: Previous median sternotomy and CABG. Heart size is normal. Tortuous aorta. Pulmonary vascularity is normal. The lungs are clear. No effusions. Ordinary mild degenerative  changes affect the spine. IMPRESSION: Previous CABG.  No active disease. Electronically Signed   By: Nelson Chimes M.D.   On: 02/18/2016 15:18  Procedures Procedures (including critical care time)  Medications Ordered in ED Medications  potassium chloride SA (K-DUR,KLOR-CON) CR tablet 40 mEq (not administered)    Chest x-ray viewed by me Results for orders placed or performed during the hospital encounter of [id number]  Basic metabolic panel  Result Value Ref Range   Sodium 140 135 - 145 mmol/L   Potassium 3.1 (L) 3.5 - 5.1 mmol/L   Chloride 105 101 - 111 mmol/L   CO2 27 22 - 32 mmol/L   Glucose, Bld 81 65 - 99 mg/dL   BUN 11 6 - 20 mg/dL   Creatinine, Ser 1.04 (H) 0.44 - 1.00 mg/dL   Calcium 9.3 8.9 - 10.3 mg/dL   GFR calc non Af Amer 58 (L) >60 mL/min   GFR calc Af Amer >60 >60 mL/min   Anion gap 8 5 - 15  CBC  Result Value Ref Range   WBC 7.0 4.0 - 10.5 K/uL   RBC 4.94 3.87 - 5.11 MIL/uL   Hemoglobin 15.0 12.0 - 15.0 g/dL   HCT 43.5 36.0 - 46.0 %   MCV 88.1 78.0 - 100.0 fL   MCH 30.4 26.0 - 34.0 pg   MCHC 34.5 30.0 - 36.0 g/dL   RDW 14.6 11.5 - 15.5 %   Platelets 262 150 - 400 K/uL  I-stat troponin, ED  Result Value Ref Range   Troponin i, poc 0.00 0.00 - 0.08 ng/mL   Comment 3          I-stat troponin, ED  Result Value Ref Range   Troponin i, poc 0.00 0.00 - 0.08 ng/mL   Comment 3           Dg Chest 2 View  Result Date: 02/18/2016 CLINICAL DATA:  Acute presentation with chest in jaw pain beginning 1 week ago an worsening. EXAM: CHEST  2 VIEW COMPARISON:  06/26/2014 FINDINGS: Previous median sternotomy and CABG. Heart size is normal. Tortuous aorta. Pulmonary vascularity is normal. The lungs are clear. No effusions. Ordinary mild degenerative changes affect the spine. IMPRESSION: Previous CABG.  No active disease. Electronically Signed   By: Nelson Chimes M.D.   On: 02/18/2016 15:18   Initial Impression / Assessment and Plan / ED Course  I have reviewed the triage  vital signs and the nursing notes.  Pertinent labs & imaging results that were available during my care of the patient were reviewed by me and considered in my medical decision making (see chart for details).  Clinical Course  8:45 PM remains asymptomatic.  Symptoms highly atypical for acute coronary syndrome. She does have EKG changes howevermay be attributable to hypokalemia.Spoke with Dr Ina Kick plan call cardiology office next week for follow-up. She should have her potassium rrechecked at her PCP office or at cardiology office within the next week or 2 Heart score equals 4 based on risk factors EKG and age however symptoms highly atypical and negativenuclearstress test 1 month ago Final Clinical Impressions(s) / ED Diagnoses  diagnosis #1 atypical chest pain #2 hypokalemia Final diagnoses:  None    New Prescriptions New Prescriptions   No medications on file     Orlie Dakin, MD 02/18/16 2052

## 2016-02-18 NOTE — Discharge Instructions (Signed)
Your blood potassium level was low today at 3.1. You weregiven potassium tablets here. Your blood potassium should be rechecked within the next 1 or 2 weeksat Dr. Evette Georges office or Dr. Louis Meckel office. Call Dr. Lucy Chris office on Monday, 02/21/2016 to arrange to be seen within the next few weeks.

## 2016-04-02 ENCOUNTER — Other Ambulatory Visit: Payer: Self-pay | Admitting: Cardiovascular Disease

## 2016-04-03 ENCOUNTER — Other Ambulatory Visit: Payer: Self-pay

## 2016-04-03 MED ORDER — NITROGLYCERIN 0.4 MG SL SUBL: 0.4000 mg | SUBLINGUAL_TABLET | SUBLINGUAL | 3 refills | Status: DC | PRN

## 2016-04-25 ENCOUNTER — Encounter: Payer: Self-pay | Admitting: *Deleted

## 2016-05-20 ENCOUNTER — Other Ambulatory Visit: Payer: Self-pay | Admitting: Cardiovascular Disease

## 2016-05-22 NOTE — Telephone Encounter (Signed)
Rx(s) sent to pharmacy electronically.  

## 2016-05-26 ENCOUNTER — Other Ambulatory Visit: Payer: Self-pay | Admitting: Cardiovascular Disease

## 2016-06-19 ENCOUNTER — Ambulatory Visit (INDEPENDENT_AMBULATORY_CARE_PROVIDER_SITE_OTHER): Payer: Medicaid Other | Admitting: Cardiovascular Disease

## 2016-06-19 ENCOUNTER — Encounter: Payer: Self-pay | Admitting: Cardiovascular Disease

## 2016-06-19 VITALS — BP 158/96 | HR 63 | Ht 68.0 in | Wt 244.0 lb

## 2016-06-19 DIAGNOSIS — E785 Hyperlipidemia, unspecified: Secondary | ICD-10-CM

## 2016-06-19 DIAGNOSIS — E6609 Other obesity due to excess calories: Secondary | ICD-10-CM

## 2016-06-19 DIAGNOSIS — I10 Essential (primary) hypertension: Secondary | ICD-10-CM | POA: Diagnosis not present

## 2016-06-19 DIAGNOSIS — Z79899 Other long term (current) drug therapy: Secondary | ICD-10-CM | POA: Diagnosis not present

## 2016-06-19 DIAGNOSIS — I2510 Atherosclerotic heart disease of native coronary artery without angina pectoris: Secondary | ICD-10-CM | POA: Diagnosis not present

## 2016-06-19 DIAGNOSIS — Z6837 Body mass index (BMI) 37.0-37.9, adult: Secondary | ICD-10-CM

## 2016-06-19 DIAGNOSIS — I2583 Coronary atherosclerosis due to lipid rich plaque: Secondary | ICD-10-CM

## 2016-06-19 MED ORDER — METOPROLOL TARTRATE 25 MG PO TABS: ORAL_TABLET | ORAL | 11 refills | Status: DC

## 2016-06-19 NOTE — Patient Instructions (Signed)
Your physician has recommended you make the following change in your medication:    1.) STOP the isosorbide.  2.) the lopressor has been increased to 2 tablets in the morning and 1 tablet at night.  3.) the lisinopril has been increased to 1.5 tablets daily ( 15 mg)  Your physician recommends that you return for lab work in: 1 week.  Your physician recommends that you schedule a follow-up appointment in: 4 months. With Dr Claiborne Billings

## 2016-06-21 NOTE — Progress Notes (Signed)
Patient ID: Amber Wong, female   DOB: March 01, 1956, 61 y.o.   MRN: [id number]     HPI: Amber Wong is a 61 y.o. female who presents to the office today for a 7 month cardiology evaluation.   Amber Wong has a history of morbid obesity, hypertension, hyperlipidemia, and strong family history for CAD.  She was admitted to [facility] hospital in May 2015 with chest pain described as a "elephant "sitting on her chest.  I performed cardiac catheterization on 10/16/2013 and she was found to have hyperdynamic LV function and evidence for severe 80% distal left main stenosis with 50% diffuse proximal LAD stenosis and otherwise normal circumflex and RCA vessels.  She underwent CABG x3 revascularization surgery by Dr. Myles Lipps and had a LIMA placed to her LAD, SVG to obtuse marginal, and SVG to her diagonal vessel with Endo vascular vein harvesting from her right thigh.   She was seen by Tarri Fuller in October 2015 with complaints involving her left leg, knee, and great toe.  When I saw her in February 2016 she noted marked improvement in her previous chest pain symptomatology.  She has a history of hypertension and has been on Lopressor 25 g twice a day and lisinopril 10 mg daily.  She is on how you Purinol for gout.  She has GERD which is been controlled with Protonix 40 mg daily.  She has been on Lipitor 20 mg for hyperlipidemia.  When I  saw her in 2016 she had begun to notice left-sided chest and arm discomfort  And felt that it may have had some similar characteristics than her chest pain prior to surgery. She underwent a nuclear perfusion study on 03/25/2015 which revealed normal perfusion.  Ejection fraction was 67%.  Her chest pain  resolved.    Since I last saw her, she admits to sharp, intermittent episodes of chest discomfort.  She admits to exertional fatigue without chest tightness.  She had seen Nell Range in June 2017.  Because of recurrent somewhat atypical  chest pain.  She underwent a repeat nuclear stress test which was done on 01/12/2016 and remained normal.  She has continued to be on atorvastatin 20 mg for hyperlipidemia.  She has been taking Lopressor 25 mrem twice a day, lisinopril 10 mg daily and I saw her mononitrate 30 mg daily for blood pressure in her chest pain.  She presents for cardiology reevaluation.  Past Medical History:  Diagnosis Date  . Asthma   . Depression   . Hypercholesteremia   . Hypertension   . Morbid obesity ([place])   . Nocturia   . Obstructive sleep apnea (adult) (pediatric) 12/27/2012   AHI 17.7 baseline, titrated to 6 cm , AHi now 2.4 - compliance   . OSA (obstructive sleep apnea)    w/ hypoventilation  . Sleep apnea     Past Surgical History:  Procedure Laterality Date  . BREAST LUMPECTOMY Right    benign at age 12  . CORONARY ARTERY BYPASS GRAFT N/A 10/20/2013   Procedure: CORONARY ARTERY BYPASS GRAFTING (CABG);  Surgeon: Ivin Poot, MD;  Location: [place];  Service: Open Heart Surgery;  Laterality: N/A;  CABG x 3  . CYSTECTOMY    . INTRAOPERATIVE TRANSESOPHAGEAL ECHOCARDIOGRAM N/A 10/20/2013   Procedure: INTRAOPERATIVE TRANSESOPHAGEAL ECHOCARDIOGRAM;  Surgeon: Ivin Poot, MD;  Location: [place];  Service: Open Heart Surgery;  Laterality: N/A;  . KNEE SURGERY     chronic pain and limping since  . LEFT HEART  CATHETERIZATION WITH CORONARY ANGIOGRAM N/A 10/16/2013   Procedure: LEFT HEART CATHETERIZATION WITH CORONARY ANGIOGRAM;  Surgeon: Troy Sine, MD;  Location: [facility] CATH LAB;  Service: Cardiovascular;  Laterality: N/A;  . PARTIAL HYSTERECTOMY    . TONSILLECTOMY      Allergies  Allergen Reactions  . Iohexol Other (See Comments)     Code: HIVES, Desc: premedicated 2 hrs prior with 251m soulumedrol IV, no reaction s/p injection, Onset Date: [id number]  . Penicillins Other (See Comments)    REACTION: pruritis  . Shellfish Allergy Other (See Comments)    Cannot have because of gout    Current  Outpatient Prescriptions  Medication Sig Dispense Refill  . acyclovir (ZOVIRAX) 200 MG capsule Take 200 mg by mouth every morning.    .Marland Kitchenalbuterol (PROVENTIL HFA;VENTOLIN HFA) 108 (90 BASE) MCG/ACT inhaler Inhale 1 puff into the lungs 2 (two) times daily.    .Marland Kitchenallopurinol (ZYLOPRIM) 100 MG tablet TAKE 1 TABLET (100 MG TOTAL) BY MOUTH DAILY. 30 tablet 5  . aspirin 81 MG tablet Take 81 mg by mouth daily.     .Marland Kitchenatorvastatin (LIPITOR) 20 MG tablet Take 1 tablet (20 mg total) by mouth daily. 30 tablet 6  . buPROPion (WELLBUTRIN SR) 100 MG 12 hr tablet Take 100 mg by mouth 2 (two) times daily.    .Marland KitchenbuPROPion (WELLBUTRIN) 100 MG tablet Take 100 mg by mouth 2 (two) times daily.  0  . cromolyn (OPTICROM) 4 % ophthalmic solution Place 1 drop into both eyes 4 (four) times daily.  2  . DUREZOL 0.05 % EMUL Place 1 drop into both eyes 2 (two) times daily.  1  . fluconazole (DIFLUCAN) 150 MG tablet Take 1 tablet by mouth daily as needed. When yeast infection is present  1  . Ketotifen Fumarate (ALAWAY OP) Place 1 drop into both eyes daily as needed (for dry eyes).    .Marland Kitchenlisinopril (PRINIVIL,ZESTRIL) 10 MG tablet Take 15 mg by mouth daily.    . meloxicam (MOBIC) 15 MG tablet Take 15 mg by mouth daily as needed for pain.   2  . metoprolol tartrate (LOPRESSOR) 25 MG tablet Take 2 tablets in the morning and 1 tablet at night 90 tablet 11  . metroNIDAZOLE (FLAGYL) 500 MG tablet Take 1 tablet by mouth daily.  0  . nitroGLYCERIN (NITROSTAT) 0.4 MG SL tablet Place 1 tablet (0.4 mg total) under the tongue every 5 (five) minutes as needed for chest pain (Max 3 doses in 15 minutes). 25 tablet 3  . pantoprazole (PROTONIX) 40 MG tablet Take 1 tablet (40 mg total) by mouth 2 (two) times daily. 60 tablet 11  . PARoxetine (PAXIL) 20 MG tablet Take 20 mg by mouth every morning.    . Prenatal Vit-Fe Fumarate-FA (PRENATAL MULTIVITAMIN) TABS tablet Take 1 tablet by mouth daily at 12 noon.    . triamcinolone cream (KENALOG) 0.1 %  Apply 1 application topically 2 (two) times daily.     No current facility-administered medications for this visit.     Social History   Social History  . Marital status: Legally Separated    Spouse name: N/A  . Number of children: N/A  . Years of education: 194  Occupational History  .  Unemployed    history of shift work   Social History Main Topics  . Smoking status: Former Smoker    Years: 15.00    Types: Cigarettes  . Smokeless tobacco: Never Used  Comment: Quit 2005  . Alcohol use No  . Drug use: No  . Sexual activity: No   Other Topics Concern  . Not on file   Social History Narrative   Patient lives at home alone.     Family History  Problem Relation Age of Onset  . Heart attack Mother 75    died of MI  . Heart attack Father 19    died of MI  . Cancer Sister     double mastectomy  . Heart attack Sister     2 sisters in their 56's with MI/stents    ROS General: Negative; No fevers, chills, or night sweats; positive for obesity HEENT: Negative; No changes in vision or hearing, sinus congestion, difficulty swallowing Pulmonary: Negative; No cough, wheezing, shortness of breath, hemoptysis Cardiovascular: See HPI:  GI: Negative; No nausea, vomiting, diarrhea, or abdominal pain GU: Negative; No dysuria, hematuria, or difficulty voiding Musculoskeletal: Negative; no myalgias, joint pain, or weakness Hematologic: Negative; no easy bruising, bleeding Rheumatologic: Positive for gout Endocrine: Negative; no heat/cold intolerance; no diabetes, Neuro: Negative; no changes in balance, headaches Skin: Negative; No rashes or skin lesions Psychiatric: Negative; No behavioral problems, depression Sleep: Positive for obstructive sleep apnea No snoring,  daytime sleepiness, hypersomnolence, bruxism, restless legs, hypnogognic hallucinations. Other comprehensive 14 point system review is negative   Physical Exam BP (!) 158/96   Pulse 63   Ht 5' 8"  (1.727 m)    Wt 244 lb (110.7 kg)   BMI 37.10 kg/m    Wt Readings from Last 3 Encounters:  06/19/16 244 lb (110.7 kg)  02/18/16 240 lb (108.9 kg)  01/12/16 241 lb (109.3 kg)   General: Alert, oriented, no distress.  Skin: normal turgor, no rashes, warm and dry HEENT: Normocephalic, atraumatic. Pupils equal round and reactive to light; sclera anicteric; extraocular muscles intact, No lid lag; Nose without nasal septal hypertrophy; Mouth/Parynx benign; Mallinpatti scale 3 Neck: No JVD, no carotid bruits; normal carotid upstroke Lungs: clear to ausculatation and percussion bilaterally; no wheezing or rales, normal inspiratory and expiratory effort Chest wall: without tenderness to palpitation; prominent keloid scarring Heart: PMI not displaced, RRR, s1 s2 normal,  1/6 systolic murmur, No diastolic murmur, no rubs, gallops, thrills, or heaves Abdomen: soft, nontender; no hepatosplenomehaly, BS+; abdominal aorta nontender and not dilated by palpation. Back: no CVA tenderness Pulses: 2+  Musculoskeletal: full range of motion, normal strength, no joint deformities Extremities: Pulses 2+, no clubbing cyanosis or edema, Homan's sign negative  Neurologic: grossly nonfocal; Cranial nerves grossly wnl Psychologic: Normal mood and affect  ECG (independently read by me): Normal sinus rhythm at 63 bpm.  No ectopy.  Nonspecific T changes.  Poor anterior R-wave progression.  March 19 2015 ECG (independently read by me): Normal sinus rhythm at 60 bpm.  Nonspecific T changes.   PriorECG (independently read by me): Normal sinus rhythm at 65 bpm.  Nonspecific T-wave changes V1 and V2.  Normal intervals.  LABS:  BMP Latest Ref Rng & Units 02/18/2016 03/25/2015 07/24/2014  Glucose 65 - 99 mg/dL 81 80 73  BUN 6 - 20 mg/dL 11 10 10   Creatinine 0.44 - 1.00 mg/dL 1.04(H) 0.77 0.78  Sodium 135 - 145 mmol/L 140 141 143  Potassium 3.5 - 5.1 mmol/L 3.1(L) 3.8 3.4(L)  Chloride 101 - 111 mmol/L 105 103 103  CO2 22 - 32  mmol/L 27 30 29   Calcium 8.9 - 10.3 mg/dL 9.3 9.1 9.2   Hepatic Function Latest Ref Rng &  Units 06/25/2015 03/25/2015 07/24/2014  Total Protein 6.1 - 8.1 g/dL 6.9 6.8 7.2  Albumin 3.6 - 5.1 g/dL 4.0 3.8 3.9  AST 10 - 35 U/L 21 20 20   ALT 6 - 29 U/L 18 18 18   Alk Phosphatase 33 - 130 U/L 158(H) 145(H) 163(H)  Total Bilirubin 0.2 - 1.2 mg/dL 0.4 0.4 0.4  Bilirubin, Direct <=0.2 mg/dL 0.1 - -    CBC Latest Ref Rng & Units 02/18/2016 03/25/2015 07/24/2014  WBC 4.0 - 10.5 K/uL 7.0 5.7 6.4  Hemoglobin 12.0 - 15.0 g/dL 15.0 14.0 14.4  Hematocrit 36.0 - 46.0 % 43.5 41.6 42.1  Platelets 150 - 400 K/uL 262 275 295    BNP No results found for: BNP  ProBNP    Component Value Date/Time   PROBNP 41.4 10/15/2013 0802   Lipid Panel     Component Value Date/Time   CHOL 140 06/25/2015 0958   TRIG 73 06/25/2015 0958   HDL 52 06/25/2015 0958   CHOLHDL 2.7 06/25/2015 0958   VLDL 15 06/25/2015 0958   LDLCALC 73 06/25/2015 0958     RADIOLOGY: No results found.   IMPRESSION:  1. Hyperlipidemia, unspecified hyperlipidemia type   2. Hyperlipidemia LDL goal <70   3. Essential hypertension   4. Coronary artery disease due to lipid rich plaque   5. Drug therapy   6. Class 2 obesity due to excess calories with body mass index (BMI) of 37.0 to 37.9 in adult, unspecified whether serious comorbidity present      ASSESSMENT AND PLAN: [name] is a 62 year-old female who was found to have severe coronary obstructive disease at diagnostic catheterization in May 2015 and underwent CABG revascularization surgery 3 to her left coronary system.  Her RCA was not bypassed.  She developed left-sided and chest discomfort and  was concerned that this may be similar to her prior discomfor in 2016.   Her Lexiscan Myoview study revealed normal perfusion.  She underwent an additional nuclear study for recurrent chest pain in August 2017 which again remained normal.  Her blood pressure today is elevated.   I have recommended titration of lisinopril to 15 mg and increase metoprolol to 50 g in the morning and 25 mg at night.  I suggested that she can discontinue isosorbide.  Fasting laboratory will be obtained in one week.  Her BMI is 37.1 and is compatible with moderate obesity.  Weight loss was recommended.  I strongly encouraged walking and exercising at least 5 days per week for 30 minutes.  She continues to be on Protonix for GERD.  I will see her in 4 months for reevaluation.    Time spent: 25 minutes  Troy Sine, MD, [facility]  06/21/2016 7:26 PM

## 2016-06-22 ENCOUNTER — Other Ambulatory Visit: Payer: Self-pay

## 2016-06-22 MED ORDER — LISINOPRIL 10 MG PO TABS: 15.0000 mg | ORAL_TABLET | Freq: Every day | ORAL | 11 refills | Status: DC

## 2016-07-08 LAB — COMPREHENSIVE METABOLIC PANEL
ALT: 18 U/L (ref 6–29)
AST: 22 U/L (ref 10–35)
Albumin: 3.9 g/dL (ref 3.6–5.1)
Alkaline Phosphatase: 144 U/L — ABNORMAL HIGH (ref 33–130)
BUN: 9 mg/dL (ref 7–25)
CALCIUM: 9 mg/dL (ref 8.6–10.4)
CO2: 30 mmol/L (ref 20–31)
Chloride: 103 mmol/L (ref 98–110)
Creat: 0.78 mg/dL (ref 0.50–0.99)
GLUCOSE: 74 mg/dL (ref 65–99)
POTASSIUM: 3.5 mmol/L (ref 3.5–5.3)
Sodium: 141 mmol/L (ref 135–146)
Total Bilirubin: 0.5 mg/dL (ref 0.2–1.2)
Total Protein: 6.7 g/dL (ref 6.1–8.1)

## 2016-07-08 LAB — CBC
HEMATOCRIT: 41.6 % (ref 35.0–45.0)
Hemoglobin: 14.2 g/dL (ref 11.7–15.5)
MCH: 29.9 pg (ref 27.0–33.0)
MCHC: 34.1 g/dL (ref 32.0–36.0)
MCV: 87.6 fL (ref 80.0–100.0)
MPV: 10.7 fL (ref 7.5–12.5)
PLATELETS: 266 10*3/uL (ref 140–400)
RBC: 4.75 MIL/uL (ref 3.80–5.10)
RDW: 14.3 % (ref 11.0–15.0)
WBC: 6.1 10*3/uL (ref 3.8–10.8)

## 2016-07-08 LAB — LIPID PANEL
CHOL/HDL RATIO: 2.8 ratio (ref <5.0)
Cholesterol: 139 mg/dL (ref <200)
HDL: 49 mg/dL — AB (ref >50)
LDL CALC: 74 mg/dL (ref <100)
Triglycerides: 82 mg/dL (ref <150)
VLDL: 16 mg/dL (ref <30)

## 2016-07-08 LAB — TSH: TSH: 0.79 m[IU]/L

## 2016-10-12 ENCOUNTER — Telehealth: Payer: Self-pay | Admitting: Cardiovascular Disease

## 2016-10-12 NOTE — Telephone Encounter (Signed)
Order signed today by Dr Claiborne Billings and faxed back to [facility] @[phone].

## 2016-10-12 NOTE — Telephone Encounter (Signed)
Patient states he CPAP equipment is not working properly and we should be receiving an updated order for signature from the company.  She has been since within the year and wants to know if Dr. Claiborne Billings will sign this without another appointment.

## 2016-10-26 ENCOUNTER — Ambulatory Visit (INDEPENDENT_AMBULATORY_CARE_PROVIDER_SITE_OTHER): Payer: Medicaid Other | Admitting: Cardiovascular Disease

## 2016-10-26 ENCOUNTER — Encounter: Payer: Self-pay | Admitting: Cardiovascular Disease

## 2016-10-26 VITALS — BP 104/80 | HR 65 | Ht 68.75 in | Wt 243.0 lb

## 2016-10-26 DIAGNOSIS — I10 Essential (primary) hypertension: Secondary | ICD-10-CM | POA: Diagnosis not present

## 2016-10-26 DIAGNOSIS — R61 Generalized hyperhidrosis: Secondary | ICD-10-CM

## 2016-10-26 DIAGNOSIS — I2583 Coronary atherosclerosis due to lipid rich plaque: Secondary | ICD-10-CM

## 2016-10-26 DIAGNOSIS — G4733 Obstructive sleep apnea (adult) (pediatric): Secondary | ICD-10-CM | POA: Diagnosis not present

## 2016-10-26 DIAGNOSIS — E668 Other obesity: Secondary | ICD-10-CM | POA: Diagnosis not present

## 2016-10-26 DIAGNOSIS — I2510 Atherosclerotic heart disease of native coronary artery without angina pectoris: Secondary | ICD-10-CM

## 2016-10-26 DIAGNOSIS — J45909 Unspecified asthma, uncomplicated: Secondary | ICD-10-CM

## 2016-10-26 DIAGNOSIS — E669 Obesity, unspecified: Secondary | ICD-10-CM

## 2016-10-26 LAB — T3, FREE: T3, Free: 2.9 pg/mL (ref 2.3–4.2)

## 2016-10-26 LAB — TSH: TSH: 1.25 m[IU]/L

## 2016-10-26 LAB — T4, FREE: FREE T4: 1.2 ng/dL (ref 0.8–1.8)

## 2016-10-26 NOTE — Patient Instructions (Addendum)
Your physician recommends that you return for lab work. Lab slips provided to you today.  You will receive your test results via mail, phone call, or by My chart.   Your physician wants you to follow-up in: 6 months or sooner if needed. You will receive a reminder letter in the mail two months in advance. If you don't receive a letter, please call our office to schedule the follow-up appointment.  If you need a refill on your cardiac medications before your next appointment, please call your pharmacy.

## 2016-10-27 NOTE — Progress Notes (Signed)
Patient ID: Suleika Donavan, female   DOB: 1955/08/18, 61 y.o.   MRN: [id number]     HPI: Valborg Friar is a 61 y.o. female who presents to the office today for a 4 month cardiology evaluation.   Ms.Marieanne Uriostegui has a history of morbid obesity, hypertension, hyperlipidemia, and strong family history for CAD.  She was admitted to [facility] in May 2015 with chest pain described as a "elephant "sitting on her chest.  I performed cardiac catheterization on 10/16/2013 and she was found to have hyperdynamic LV function and evidence for severe 80% distal left main stenosis with 50% diffuse proximal LAD stenosis and otherwise normal circumflex and RCA vessels.  She underwent CABG x3 revascularization surgery by Dr. Myles Lipps and had a LIMA placed to her LAD, SVG to obtuse marginal, and SVG to her diagonal vessel with Endo vascular vein harvesting from her right thigh.   She was seen by Tarri Fuller in October 2015 with complaints involving her left leg, knee, and great toe.  When I saw her in February 2016 she noted marked improvement in her previous chest pain symptomatology.  She has a history of hypertension and has been on Lopressor 25 g twice a day and lisinopril 10 mg daily.  She is on how you Purinol for gout.  She has GERD which is been controlled with Protonix 40 mg daily.  She has been on Lipitor 20 mg for hyperlipidemia.  When I  saw her in 2016 she had begun to notice left-sided chest and arm discomfort which may have had some similar characteristics than her chest pain prior to surgery. She underwent a nuclear perfusion study on 03/25/2015 which revealed normal perfusion.  Ejection fraction was 67%.  Her chest pain  resolved.    She saw Nell Range in June 2017 and because of recurrent somewhat atypical chest pain.  She underwent a repeat nuclear stress test which was done on 01/12/2016 and remained normal.  Since I last saw her.  She denies recurrent episodes  of chest pain.  She admits to being more fatigued. She is waking up 2-3 times per night.  She has a history of asthma and is was started on Advair.  She also has a history of obstructive sleep apnea and [employer] as her DME company.  Her last CPAP machine was in 2014.  She is in need for supplies.  She was told she needed a face-to-face evaluation.  She states that she uses CPAP with 100% compliance and is averaging 6 hours per night.  She has not been successful with weight loss.  She admits to increased sweating.  She states when she gets nervous she typically eats.  She has continued to be on atorvastatin 20 mg for hyperlipidemia.  She has been taking Lopressor 50 mg in the morning and 25 mg at night, lisinopril 15 mg daily for hypertension.  She is on pantoprazole 40 mg twice a day for GERD.  She continues to be on Wellbutrin and Paxil.  She presents for evaluation.  Past Medical History:  Diagnosis Date  . Asthma   . Depression   . Hypercholesteremia   . Hypertension   . Morbid obesity ([place])   . Nocturia   . Obstructive sleep apnea (adult) (pediatric) 12/27/2012   AHI 17.7 baseline, titrated to 6 cm , AHi now 2.4 - compliance   . OSA (obstructive sleep apnea)    w/ hypoventilation  . Sleep apnea     Past  Surgical History:  Procedure Laterality Date  . BREAST LUMPECTOMY Right    benign at age 36  . CORONARY ARTERY BYPASS GRAFT N/A 10/20/2013   Procedure: CORONARY ARTERY BYPASS GRAFTING (CABG);  Surgeon: Ivin Poot, MD;  Location: [place];  Service: Open Heart Surgery;  Laterality: N/A;  CABG x 3  . CYSTECTOMY    . INTRAOPERATIVE TRANSESOPHAGEAL ECHOCARDIOGRAM N/A 10/20/2013   Procedure: INTRAOPERATIVE TRANSESOPHAGEAL ECHOCARDIOGRAM;  Surgeon: Ivin Poot, MD;  Location: [place];  Service: Open Heart Surgery;  Laterality: N/A;  . KNEE SURGERY     chronic pain and limping since  . LEFT HEART CATHETERIZATION WITH CORONARY ANGIOGRAM N/A 10/16/2013   Procedure: LEFT HEART  CATHETERIZATION WITH CORONARY ANGIOGRAM;  Surgeon: Troy Sine, MD;  Location[facility] LAB;  Service: Cardiovascular;  Laterality: N/A;  . PARTIAL HYSTERECTOMY    . TONSILLECTOMY      Allergies  Allergen Reactions  . Iohexol Other (See Comments)     Code: HIVES, Desc: premedicated 2 hrs prior with 263m soulumedrol IV, no reaction s/p injection, Onset Date: [id number]  . Penicillins Other (See Comments)    REACTION: pruritis  . Shellfish Allergy Other (See Comments)    Cannot have because of gout    Current Outpatient Prescriptions  Medication Sig Dispense Refill  . acyclovir (ZOVIRAX) 200 MG capsule Take 200 mg by mouth every morning.    .Marland Kitchenalbuterol (PROVENTIL HFA;VENTOLIN HFA) 108 (90 BASE) MCG/ACT inhaler Inhale 1 puff into the lungs 2 (two) times daily.    .Marland Kitchenallopurinol (ZYLOPRIM) 100 MG tablet Take 100 mg by mouth 3 (three) times daily.    .Marland Kitchenaspirin 81 MG tablet Take 81 mg by mouth daily.     .Marland Kitchenatorvastatin (LIPITOR) 20 MG tablet Take 1 tablet (20 mg total) by mouth daily. 30 tablet 6  . buPROPion (WELLBUTRIN) 100 MG tablet Take 100 mg by mouth 2 (two) times daily.  0  . cromolyn (OPTICROM) 4 % ophthalmic solution Place 1 drop into both eyes 4 (four) times daily.  2  . fluconazole (DIFLUCAN) 150 MG tablet Take 1 tablet by mouth daily as needed. When yeast infection is present  1  . Fluticasone-Salmeterol (ADVAIR) 250-50 MCG/DOSE AEPB Inhale 1 puff into the lungs 2 (two) times daily.    .Marland KitchenKetotifen Fumarate (ALAWAY OP) Place 1 drop into both eyes daily as needed (for dry eyes).    .Marland Kitchenlisinopril (PRINIVIL,ZESTRIL) 10 MG tablet Take 1.5 tablets (15 mg total) by mouth daily. 45 tablet 11  . meloxicam (MOBIC) 15 MG tablet Take 15 mg by mouth daily as needed for pain.   2  . metoprolol tartrate (LOPRESSOR) 25 MG tablet Take 2 tablets in the morning and 1 tablet at night 90 tablet 11  . nitroGLYCERIN (NITROSTAT) 0.4 MG SL tablet Place 1 tablet (0.4 mg total) under the tongue every 5  (five) minutes as needed for chest pain (Max 3 doses in 15 minutes). 25 tablet 3  . pantoprazole (PROTONIX) 40 MG tablet Take 1 tablet (40 mg total) by mouth 2 (two) times daily. 60 tablet 11  . PARoxetine (PAXIL) 20 MG tablet Take 20 mg by mouth every morning.    . Prenatal Vit-Fe Fumarate-FA (PRENATAL MULTIVITAMIN) TABS tablet Take 1 tablet by mouth daily at 12 noon.    . triamcinolone cream (KENALOG) 0.1 % Apply 1 application topically 2 (two) times daily.     No current facility-administered medications for this visit.  Social History   Social History  . Marital status: Legally Separated    Spouse name: N/A  . Number of children: N/A  . Years of education: 67   Occupational History  .  Unemployed    history of shift work   Social History Main Topics  . Smoking status: Former Smoker    Years: 15.00    Types: Cigarettes  . Smokeless tobacco: Never Used     Comment: Quit 2005  . Alcohol use No  . Drug use: No  . Sexual activity: No   Other Topics Concern  . Not on file   Social History Narrative   Patient lives at home alone.     Family History  Problem Relation Age of Onset  . Heart attack Mother 36       died of MI  . Heart attack Father 59       died of MI  . Cancer Sister        double mastectomy  . Heart attack Sister        2 sisters in their 48's with MI/stents    ROS General: Negative; No fevers, chills, or night sweats; positive for obesity HEENT: Negative; No changes in vision or hearing, sinus congestion, difficulty swallowing Pulmonary: Negative; No cough, wheezing, shortness of breath, hemoptysis Cardiovascular: See HPI:  GI: Negative; No nausea, vomiting, diarrhea, or abdominal pain GU: Negative; No dysuria, hematuria, or difficulty voiding Musculoskeletal: Negative; no myalgias, joint pain, or weakness Hematologic: Negative; no easy bruising, bleeding Rheumatologic: Positive for gout Endocrine: Negative; no heat/cold intolerance; no  diabetes, Neuro: Negative; no changes in balance, headaches Skin: Negative; No rashes or skin lesions Psychiatric: Negative; No behavioral problems, depression Sleep: Positive for obstructive sleep apnea No snoring,  daytime sleepiness, hypersomnolence, bruxism, restless legs, hypnogognic hallucinations. Other comprehensive 14 point system review is negative   Physical Exam BP 104/80   Pulse 65   Ht 5' 8.75" (1.746 m)   Wt 243 lb (110.2 kg)   BMI 36.15 kg/m    Wt Readings from Last 3 Encounters:  10/26/16 243 lb (110.2 kg)  06/19/16 244 lb (110.7 kg)  02/18/16 240 lb (108.9 kg)    General: Alert, oriented, no distress.  Skin: normal turgor, no rashes, warm and dry HEENT: Normocephalic, atraumatic. Pupils equal round and reactive to light; sclera anicteric; extraocular muscles intact;  Nose without nasal septal hypertrophy Mouth/Parynx benign; Mallinpatti scale 3 Neck: No JVD, no carotid bruits; normal carotid upstroke Lungs: clear to ausculatation and percussion; no wheezing or rales Chest wall: without tenderness to palpitation; keloid scarring Heart: PMI not displaced, RRR, s1 s2 normal, 1/6 systolic murmur, no diastolic murmur, no rubs, gallops, thrills, or heaves Abdomen: soft, nontender; no hepatosplenomehaly, BS+; abdominal aorta nontender and not dilated by palpation. Back: no CVA tenderness Pulses 2+ Musculoskeletal: full range of motion, normal strength, no joint deformities Extremities: no clubbing cyanosis or edema, Homan's sign negative  Neurologic: grossly nonfocal; Cranial nerves grossly wnl Psychologic: Normal mood and affect   ECG (independently read by me): normal sinus rhythm at 65 bpm.  Nonspecific T changes.  Normal intervals.  No ectopy.  January 2018 ECG (independently read by me): Normal sinus rhythm at 63 bpm.  No ectopy.  Nonspecific T changes.  Poor anterior R-wave progression.  March 19 2015 ECG (independently read by me): Normal sinus rhythm at  60 bpm.  Nonspecific T changes.   PriorECG (independently read by me): Normal sinus rhythm at 65 bpm.  Nonspecific T-wave changes V1 and V2.  Normal intervals.  LABS:  BMP Latest Ref Rng & Units 07/07/2016 02/18/2016 03/25/2015  Glucose 65 - 99 mg/dL 74 81 80  BUN 7 - 25 mg/dL 9 11 10   Creatinine 0.50 - 0.99 mg/dL 0.78 1.04(H) 0.77  Sodium 135 - 146 mmol/L 141 140 141  Potassium 3.5 - 5.3 mmol/L 3.5 3.1(L) 3.8  Chloride 98 - 110 mmol/L 103 105 103  CO2 20 - 31 mmol/L 30 27 30   Calcium 8.6 - 10.4 mg/dL 9.0 9.3 9.1   Hepatic Function Latest Ref Rng & Units 07/07/2016 06/25/2015 03/25/2015  Total Protein 6.1 - 8.1 g/dL 6.7 6.9 6.8  Albumin 3.6 - 5.1 g/dL 3.9 4.0 3.8  AST 10 - 35 U/L 22 21 20   ALT 6 - 29 U/L 18 18 18   Alk Phosphatase 33 - 130 U/L 144(H) 158(H) 145(H)  Total Bilirubin 0.2 - 1.2 mg/dL 0.5 0.4 0.4  Bilirubin, Direct <=0.2 mg/dL - 0.1 -    CBC Latest Ref Rng & Units 07/07/2016 02/18/2016 03/25/2015  WBC 3.8 - 10.8 K/uL 6.1 7.0 5.7  Hemoglobin 11.7 - 15.5 g/dL 14.2 15.0 14.0  Hematocrit 35.0 - 45.0 % 41.6 43.5 41.6  Platelets 140 - 400 K/uL 266 262 275    BNP No results found for: BNP  ProBNP    Component Value Date/Time   PROBNP 41.4 10/15/2013 0802   Lipid Panel     Component Value Date/Time   CHOL 139 07/07/2016 1501   TRIG 82 07/07/2016 1501   HDL 49 (L) 07/07/2016 1501   CHOLHDL 2.8 07/07/2016 1501   VLDL 16 07/07/2016 1501   LDLCALC 74 07/07/2016 1501     RADIOLOGY: No results found.   IMPRESSION:  1. Coronary artery disease due to lipid rich plaque   2. Essential hypertension   3. Excessive sweating   4. OSA (obstructive sleep apnea)   5. Mild asthma without complication, unspecified whether persistent   6. Moderate obesity      ASSESSMENT AND PLAN: Ms.Ousmanealhassan is a 61 year-old female who was found to have severe coronary obstructive disease at diagnostic catheterization in May 2015 and underwent CABG revascularization surgery 3 to  her left coronary system.  Her RCA was not bypassed.  She developed left-sided and chest discomfort and  was concerned that this may be similar to her prior discomfor in 2016.   Her Lexiscan Myoview study revealed normal perfusion.  She underwent an additional nuclear study for recurrent chest pain in August 2017 which again remained normal.  When I last saw her, I further titrated her lisinopril dose to 15 mg and she continues to take metoprolol 50 g in the morning and 25 mg at night.  Her blood pressure today is normal when checked by me and was 120/80 supine and 112/80 standing.  She has experienced increased sweating episodes and I am recommending follow-up thyroid function studies be checked including a TSH, free T4, free T3 and TI.  She has noticed more fatigue recently and is waking up 2 times per night with nocturia.  She goes to bed at 10 and wakes up at 6 AM and is sleeping adequate duration.  I have recommended a download of her CPAP unit be obtained.  Her BMI is 36.  Weight loss is necessary.  We discussed improved exercise at least 5 days per week for 30 minutes.  He has asthma.  Currently there is no wheezing and she is now on Advair.  I will see her in 6 months for reevaluation    Time spent: 25 minutes  Troy Sine, MD, [facility]  10/28/2016 7:28 PM

## 2016-11-01 ENCOUNTER — Telehealth: Payer: Self-pay | Admitting: Cardiovascular Disease

## 2016-11-01 NOTE — Telephone Encounter (Signed)
Received a call from Amber Wong with [facility].He stated he has a order for a free thyroxine index.That test is only done in a panel with T3 uptake,T4 total.He will do that panel.He just wanted to make [place] aware.

## 2016-11-09 ENCOUNTER — Telehealth: Payer: Self-pay | Admitting: *Deleted

## 2016-11-09 DIAGNOSIS — R61 Generalized hyperhidrosis: Secondary | ICD-10-CM

## 2016-11-09 NOTE — Telephone Encounter (Signed)
Received a call from [facility] lab regarding the Thyroxine index that was ordered did not cross over so it was not done with her labs. Order placed and faxed to [facility] and they will call patient to have her come back to get lab test done.

## 2016-11-17 ENCOUNTER — Telehealth: Payer: Self-pay | Admitting: Cardiovascular Disease

## 2016-11-17 NOTE — Telephone Encounter (Signed)
New Message  Pt call requesting to speak with RN about thyroid results. Please call back to discuss

## 2016-11-17 NOTE — Telephone Encounter (Signed)
Waiting for TK results- preliminary result WNL  Pt notified-waiting for TK

## 2016-11-24 NOTE — Telephone Encounter (Signed)
wnl

## 2016-11-24 NOTE — Telephone Encounter (Signed)
Pt.notified

## 2016-11-30 ENCOUNTER — Emergency Department (HOSPITAL_COMMUNITY)
Admission: EM | Admit: 2016-11-30 | Discharge: 2016-11-30 | Disposition: A | Discharge status: Home / Self Care | Payer: Medicaid Other | Attending: Emergency Medicine | Admitting: Emergency Medicine

## 2016-11-30 ENCOUNTER — Emergency Department (HOSPITAL_COMMUNITY): Payer: Medicaid Other

## 2016-11-30 ENCOUNTER — Encounter (HOSPITAL_COMMUNITY): Payer: Self-pay

## 2016-11-30 DIAGNOSIS — R0789 Other chest pain: Secondary | ICD-10-CM

## 2016-11-30 DIAGNOSIS — R070 Pain in throat: Secondary | ICD-10-CM | POA: Insufficient documentation

## 2016-11-30 DIAGNOSIS — Z79899 Other long term (current) drug therapy: Secondary | ICD-10-CM | POA: Diagnosis not present

## 2016-11-30 DIAGNOSIS — I110 Hypertensive heart disease with heart failure: Secondary | ICD-10-CM | POA: Insufficient documentation

## 2016-11-30 DIAGNOSIS — J45909 Unspecified asthma, uncomplicated: Secondary | ICD-10-CM | POA: Diagnosis not present

## 2016-11-30 DIAGNOSIS — I2510 Atherosclerotic heart disease of native coronary artery without angina pectoris: Secondary | ICD-10-CM | POA: Insufficient documentation

## 2016-11-30 DIAGNOSIS — Z7982 Long term (current) use of aspirin: Secondary | ICD-10-CM | POA: Insufficient documentation

## 2016-11-30 DIAGNOSIS — Z87891 Personal history of nicotine dependence: Secondary | ICD-10-CM | POA: Diagnosis not present

## 2016-11-30 DIAGNOSIS — R42 Dizziness and giddiness: Secondary | ICD-10-CM | POA: Diagnosis present

## 2016-11-30 DIAGNOSIS — M25512 Pain in left shoulder: Secondary | ICD-10-CM | POA: Diagnosis not present

## 2016-11-30 DIAGNOSIS — I5032 Chronic diastolic (congestive) heart failure: Secondary | ICD-10-CM | POA: Diagnosis not present

## 2016-11-30 DIAGNOSIS — Z951 Presence of aortocoronary bypass graft: Secondary | ICD-10-CM | POA: Diagnosis not present

## 2016-11-30 LAB — I-STAT TROPONIN, ED: TROPONIN I, POC: 0.01 ng/mL (ref 0.00–0.08)

## 2016-11-30 LAB — BASIC METABOLIC PANEL
ANION GAP: 8 (ref 5–15)
BUN: 9 mg/dL (ref 6–20)
CALCIUM: 9.1 mg/dL (ref 8.9–10.3)
CO2: 26 mmol/L (ref 22–32)
CREATININE: 0.77 mg/dL (ref 0.44–1.00)
Chloride: 106 mmol/L (ref 101–111)
GFR calc non Af Amer: >60 mL/min (ref >60)
Glucose, Bld: 62 mg/dL — ABNORMAL LOW (ref 65–99)
Potassium: 3.2 mmol/L — ABNORMAL LOW (ref 3.5–5.1)
SODIUM: 140 mmol/L (ref 135–145)

## 2016-11-30 LAB — CBC
HCT: 41.4 % (ref 36.0–46.0)
HEMOGLOBIN: 14 g/dL (ref 12.0–15.0)
MCH: 29.9 pg (ref 26.0–34.0)
MCHC: 33.8 g/dL (ref 30.0–36.0)
MCV: 88.5 fL (ref 78.0–100.0)
PLATELETS: 245 10*3/uL (ref 150–400)
RBC: 4.68 MIL/uL (ref 3.87–5.11)
RDW: 14.4 % (ref 11.5–15.5)
WBC: 7.5 10*3/uL (ref 4.0–10.5)

## 2016-11-30 LAB — BRAIN NATRIURETIC PEPTIDE: B NATRIURETIC PEPTIDE 5: 148.4 pg/mL — AB (ref 0.0–100.0)

## 2016-11-30 NOTE — ED Notes (Signed)
Patient transported to X-ray 

## 2016-11-30 NOTE — Discharge Instructions (Signed)
As discussed, your evaluation today has been largely reassuring.  But, it is important that you monitor your condition carefully, and do not hesitate to return to the ED if you develop new, or concerning changes in your condition. ? ?Otherwise, please follow-up with your physician for appropriate ongoing care. ? ?

## 2016-11-30 NOTE — ED Triage Notes (Signed)
Patient c/o intermittent left chest pain that radiates into the back and dizziness. Patient states she feels tired a lot.

## 2016-11-30 NOTE — ED Provider Notes (Signed)
[facility] DEPT Provider Note   CSN: [id number] Arrival date & time: 11/30/16  1659     History   Chief Complaint Chief Complaint  Patient presents with  . Chest Pain  . Dizziness    HPI Amber Wong is a 61 y.o. female.  HPI  Patient presents with concern of ongoing left upper chest and shoulder pain as well as throat discomfort. Patient does have history of coronary disease, with bypass 3 years ago. She notes that over the past week, without clear precipitant she has had persistent soreness throughout the left upper chest and left shoulder No new dyspnea, no new cough, no syncope, no fever, no chills. Patient also has some discomfort in her throat, notes that she has had this possibly for longer, and she attributes this to her nocturnal CPAP use. No recent medication changes, diet changes, activity changes.  Past Medical History:  Diagnosis Date  . Asthma   . Depression   . Hypercholesteremia   . Hypertension   . Morbid obesity ([place])   . Nocturia   . Obstructive sleep apnea (adult) (pediatric) 12/27/2012   AHI 17.7 baseline, titrated to 6 cm , AHi now 2.4 - compliance   . OSA (obstructive sleep apnea)    w/ hypoventilation  . Sleep apnea     Patient Active Problem List   Diagnosis Date Noted  . Hyperlipidemia with target LDL less than 70 03/19/2015  . Hyperlipidemia LDL goal <70 03/19/2015  . Left leg pain 03/25/2014  . Gout- Lt great toe 03/25/2014  . CAD (coronary artery disease) 10/20/2013  . Diastolic CHF ([place]) 14/43/1540  . COPD exacerbation ([place]) 10/17/2013  . HLD (hyperlipidemia) 10/17/2013  . Unstable angina ([place]) 10/16/2013  . Obesity 10/16/2013  . Hypokalemia 10/16/2013  . Chest pain 10/15/2013  . Nocturnal hypoxemia 03/28/2013  . Obstructive sleep apnea 12/27/2012  . VITAMIN D DEFICIENCY 02/21/2010  . ELEVATED SEDIMENTATION RATE 02/21/2010  . FATTY LIVER DISEASE 01/14/2010  . FATIGUE 12/17/2009  . PLEURAL EFFUSION 09/02/2009  .  BENIGN NEOPLASM OF ADRENAL GLAND 09/01/2009  . HEMATURIA UNSPECIFIED 09/01/2009  . Nonspecific (abnormal) findings on radiological and other examination of body structure 09/01/2009  . Personal history of other endocrine, metabolic, and immunity disorders 09/01/2009  . CT, CHEST, ABNORMAL 09/01/2009  . ACUTE BRONCHITIS 04/27/2009  . ACUTE CYSTITIS 03/24/2009  . COUGH 03/24/2009  . ULNAR NEUROPATHY, BILATERAL 01/04/2009  . HEADACHE 01/04/2009  . CHEST PAIN UNSPECIFIED 01/04/2009  . MUSCLE SPASM 11/16/2008  . DENTAL CARIES 11/06/2007  . HYPERLIPIDEMIA 09/01/2007  . DYSPNEA ON EXERTION 08/27/2007  . CARPAL TUNNEL SYNDROME 05/14/2007  . SLEEP APNEA 04/09/2007  . ANXIETY 02/11/2007  . ABUSE, ALCOHOL, IN REMISSION 02/11/2007  . PERIPHERAL NEUROPATHY 02/11/2007  . ALLERGIC RHINITIS 02/11/2007  . GERD 02/11/2007  . OSTEOPENIA 02/11/2007  . HSV 02/05/2007  . DEPRESSION 02/05/2007  . Essential hypertension 02/05/2007  . HEMATURIA 02/05/2007    Past Surgical History:  Procedure Laterality Date  . BREAST LUMPECTOMY Right    benign at age 55  . CORONARY ARTERY BYPASS GRAFT N/A 10/20/2013   Procedure: CORONARY ARTERY BYPASS GRAFTING (CABG);  Surgeon: Ivin Poot, MD;  Location: [facility];  Service: Open Heart Surgery;  Laterality: N/A;  CABG x 3  . CYSTECTOMY    . INTRAOPERATIVE TRANSESOPHAGEAL ECHOCARDIOGRAM N/A 10/20/2013   Procedure: INTRAOPERATIVE TRANSESOPHAGEAL ECHOCARDIOGRAM;  Surgeon: Ivin Poot, MD;  Location: [facility];  Service: Open Heart Surgery;  Laterality: N/A;  . KNEE SURGERY  chronic pain and limping since  . LEFT HEART CATHETERIZATION WITH CORONARY ANGIOGRAM N/A 10/16/2013   Procedure: LEFT HEART CATHETERIZATION WITH CORONARY ANGIOGRAM;  Surgeon: Troy Sine, MD;  Location: [facility] CATH LAB;  Service: Cardiovascular;  Laterality: N/A;  . PARTIAL HYSTERECTOMY    . TONSILLECTOMY      OB History    No data available       Home Medications    Prior to Admission  medications   Medication Sig Start Date End Date Taking? Authorizing Provider  acyclovir (ZOVIRAX) 200 MG capsule Take 200 mg by mouth every morning.   Yes [provider]  albuterol (PROVENTIL HFA;VENTOLIN HFA) 108 (90 BASE) MCG/ACT inhaler Inhale 1 puff into the lungs 2 (two) times daily.   Yes [provider]  allopurinol (ZYLOPRIM) 100 MG tablet Take 100 mg by mouth 3 (three) times daily.   Yes [provider]  aspirin 81 MG tablet Take 81 mg by mouth daily.    Yes [provider]  atorvastatin (LIPITOR) 20 MG tablet Take 1 tablet (20 mg total) by mouth daily. 06/25/15  Yes Troy Sine, MD  buPROPion (WELLBUTRIN) 100 MG tablet Take 100 mg by mouth 2 (two) times daily. 10/24/15  Yes [provider]  cholecalciferol (VITAMIN D) 1000 units tablet Take 1,000 Units by mouth daily.   Yes [provider]  cromolyn (OPTICROM) 4 % ophthalmic solution Place 1 drop into both eyes 4 (four) times daily. 06/16/15  Yes [provider]  fluconazole (DIFLUCAN) 150 MG tablet Take 1 tablet by mouth daily as needed. When yeast infection is present 09/17/15  Yes [provider]  Fluticasone-Salmeterol (ADVAIR) 250-50 MCG/DOSE AEPB Inhale 1 puff into the lungs 2 (two) times daily.   Yes [provider]  lisinopril (PRINIVIL,ZESTRIL) 10 MG tablet Take 1.5 tablets (15 mg total) by mouth daily. 06/22/16  Yes Troy Sine, MD  meloxicam (MOBIC) 15 MG tablet Take 15 mg by mouth daily as needed for pain.  02/19/15  Yes [provider]  metoprolol tartrate (LOPRESSOR) 25 MG tablet Take 2 tablets in the morning and 1 tablet at night 06/19/16  Yes Troy Sine, MD  nitroGLYCERIN (NITROSTAT) 0.4 MG SL tablet Place 1 tablet (0.4 mg total) under the tongue every 5 (five) minutes as needed for chest pain (Max 3 doses in 15 minutes). 04/03/16  Yes Troy Sine, MD  pantoprazole (PROTONIX) 40 MG tablet Take 1 tablet (40 mg total) by mouth 2  (two) times daily. 11/23/15  Yes Eileen Stanford, PA-C  PARoxetine (PAXIL) 20 MG tablet Take 20 mg by mouth every morning.   Yes [provider]  Prenatal Vit-Fe Fumarate-FA (PRENATAL MULTIVITAMIN) TABS tablet Take 1 tablet by mouth daily at 12 noon.   Yes [provider]  triamcinolone cream (KENALOG) 0.1 % Apply 1 application topically 2 (two) times daily.   Yes [provider]    Family History Family History  Problem Relation Age of Onset  . Heart attack Mother 78       died of MI  . Heart attack Father 68       died of MI  . Cancer Sister        double mastectomy  . Heart attack Sister        2 sisters in their 29's with MI/stents    Social History Social History  Substance Use Topics  . Smoking status: Former Smoker    Years: 15.00  Types: Cigarettes  . Smokeless tobacco: Never Used     Comment: Quit 2005  . Alcohol use No     Allergies   Iohexol; Penicillins; and Shellfish allergy   Review of Systems Review of Systems  Constitutional:       Per HPI, otherwise negative  HENT:       Per HPI, otherwise negative  Respiratory:       Per HPI, otherwise negative  Cardiovascular:       Per HPI, otherwise negative  Gastrointestinal: Negative for vomiting.  Endocrine:       Negative aside from HPI  Genitourinary:       Neg aside from HPI   Musculoskeletal:       Per HPI, otherwise negative  Skin: Negative.   Neurological: Negative for syncope.     Physical Exam Updated Vital Signs BP (!) 147/92   Pulse 61   Temp 98.4 F (36.9 C) (Oral)   Resp 17   Ht 5' 8.75" (1.746 m)   Wt 109.8 kg (242 lb)   SpO2 94%   BMI 36.00 kg/m   Physical Exam  Constitutional: She is oriented to person, place, and time. She appears well-developed and well-nourished. No distress.  HENT:  Head: Normocephalic and atraumatic.  Eyes: Conjunctivae and EOM are normal.  Cardiovascular: Normal rate and regular rhythm.   Pulmonary/Chest: Effort  normal and breath sounds normal. No stridor. No respiratory distress.  Abdominal: She exhibits no distension. There is no tenderness. There is no rebound and no guarding.  Musculoskeletal: She exhibits no edema.  Neurological: She is alert and oriented to person, place, and time. No cranial nerve deficit.  Skin: Skin is warm and dry.  Psychiatric: She has a normal mood and affect.  Nursing note and vitals reviewed.    ED Treatments / Results  Labs (all labs ordered are listed, but only abnormal results are displayed) Labs Reviewed  BASIC METABOLIC PANEL - Abnormal; Notable for the following:       Result Value   Potassium 3.2 (*)    Glucose, Bld 62 (*)    All other components within normal limits  BRAIN NATRIURETIC PEPTIDE - Abnormal; Notable for the following:    B Natriuretic Peptide 148.4 (*)    All other components within normal limits  CBC  I-STAT TROPOININ, ED    EKG  EKG Interpretation  Date/Time:  Thursday November 30 2016 17:14:50 EDT Ventricular Rate:  63 PR Interval:    QRS Duration: 91 QT Interval:  421 QTC Calculation: 431 R Axis:   4 Text Interpretation:  Sinus rhythm Nonspecific T abnormalities, inferior leads Artifact Abnormal ekg Confirmed by Carmin Muskrat [phone]) on 11/30/2016 5:25:25 PM       Radiology Dg Chest 2 View  Result Date: 11/30/2016 CLINICAL DATA:  Intermittent left chest pain EXAM: CHEST  2 VIEW COMPARISON:  February 18, 2016 FINDINGS: The heart size and mediastinal contours are stable. Patient status post prior median sternotomy and CABG. Both lungs are clear. The visualized skeletal structures are stable. Degenerative joint changes of the spine are noted. IMPRESSION: No active cardiopulmonary disease. Electronically Signed   By: Abelardo Diesel M.D.   On: 11/30/2016 18:28    Procedures Procedures (including critical care time)  Medications Ordered in ED Medications - No data to display   Initial Impression / Assessment and Plan / ED  Course  I have reviewed the triage vital signs and the nursing notes.  Pertinent labs & imaging results  that were available during my care of the patient were reviewed by me and considered in my medical decision making (see chart for details).  On repeat exam the patient is awake and alert, sitting upright, in no distress per We discussed all findings include reassuring labs, EKG, x-ray. Given the duration of her pain, normal troponin, nonischemic EKG are reassuring for the low suspicion of ongoing coronary ischemia. No evidence for pneumothorax, pneumonia or other acute new pathology either. Patient will follow up with her cardiologist and primary care.  Final Clinical Impressions(s) / ED Diagnoses  Atypical chest pain   Carmin Muskrat, MD 11/30/16 1918

## 2016-12-01 ENCOUNTER — Other Ambulatory Visit: Payer: Self-pay | Admitting: Physician Assistant

## 2016-12-04 ENCOUNTER — Telehealth: Payer: Self-pay | Admitting: Cardiovascular Disease

## 2016-12-04 DIAGNOSIS — R0789 Other chest pain: Secondary | ICD-10-CM

## 2016-12-04 NOTE — Telephone Encounter (Signed)
Pt notified TK is out of the office and scheduling will call to scheduled ECHO as soon as we get the OK from TK

## 2016-12-04 NOTE — Telephone Encounter (Signed)
New message    Pt is calling stating that she was in the ER over the weekend. They told her to call and schedule an Echo. There is no order.

## 2016-12-05 NOTE — Telephone Encounter (Signed)
ECHO ordered 

## 2016-12-05 NOTE — Telephone Encounter (Signed)
Ok to schedule echo

## 2016-12-08 ENCOUNTER — Other Ambulatory Visit: Payer: Self-pay | Admitting: Physician Assistant

## 2017-01-09 ENCOUNTER — Other Ambulatory Visit (HOSPITAL_COMMUNITY): Payer: Medicaid Other

## 2017-01-12 ENCOUNTER — Other Ambulatory Visit: Payer: Self-pay

## 2017-01-12 ENCOUNTER — Ambulatory Visit (HOSPITAL_COMMUNITY): Payer: Medicaid Other | Attending: Cardiology

## 2017-01-12 DIAGNOSIS — I110 Hypertensive heart disease with heart failure: Secondary | ICD-10-CM | POA: Insufficient documentation

## 2017-01-12 DIAGNOSIS — I509 Heart failure, unspecified: Secondary | ICD-10-CM | POA: Diagnosis not present

## 2017-01-12 DIAGNOSIS — E785 Hyperlipidemia, unspecified: Secondary | ICD-10-CM | POA: Diagnosis not present

## 2017-01-12 DIAGNOSIS — E669 Obesity, unspecified: Secondary | ICD-10-CM | POA: Diagnosis not present

## 2017-01-12 DIAGNOSIS — J449 Chronic obstructive pulmonary disease, unspecified: Secondary | ICD-10-CM | POA: Insufficient documentation

## 2017-01-12 DIAGNOSIS — Z6836 Body mass index (BMI) 36.0-36.9, adult: Secondary | ICD-10-CM | POA: Insufficient documentation

## 2017-01-12 DIAGNOSIS — I77810 Thoracic aortic ectasia: Secondary | ICD-10-CM | POA: Insufficient documentation

## 2017-01-12 DIAGNOSIS — R0789 Other chest pain: Secondary | ICD-10-CM | POA: Insufficient documentation

## 2017-01-12 DIAGNOSIS — G4733 Obstructive sleep apnea (adult) (pediatric): Secondary | ICD-10-CM | POA: Diagnosis not present

## 2017-01-12 DIAGNOSIS — R079 Chest pain, unspecified: Secondary | ICD-10-CM | POA: Diagnosis present

## 2017-01-12 DIAGNOSIS — I2510 Atherosclerotic heart disease of native coronary artery without angina pectoris: Secondary | ICD-10-CM | POA: Insufficient documentation

## 2017-01-23 ENCOUNTER — Telehealth: Payer: Self-pay | Admitting: *Deleted

## 2017-01-23 NOTE — Telephone Encounter (Signed)
Patient aware of results. Verbalized understanding.   Patient due for follow up in November, appointment scheduled.  Patient requesting Dr. Claiborne Billings to take over her CPAP as well.  Advised to discuss with him at [facility].     DME company-[facility].  Dr. Brett Fairy previously managing, sleep study 2014 with [facility] (in media).

## 2017-01-23 NOTE — Telephone Encounter (Signed)
-----   Message from Troy Sine, MD sent at 01/22/2017  5:48 PM EDT ----- LV Function is normal.  Grade 1 diastolic dysfunction.  Normal lung pressures.  No significant valvular normality.

## 2017-02-05 ENCOUNTER — Telehealth: Payer: Self-pay | Admitting: Cardiovascular Disease

## 2017-02-05 NOTE — Telephone Encounter (Signed)
New message   Pt is calling to get the test results again. She did not write it down and forgot. Requests a call back.

## 2017-02-05 NOTE — Telephone Encounter (Signed)
Returned the call to the patient. Her echo results have been reread to her and explained. She verbalized her understanding.

## 2017-03-08 ENCOUNTER — Telehealth: Payer: Self-pay | Admitting: Cardiovascular Disease

## 2017-03-08 NOTE — Telephone Encounter (Signed)
New message    Pt is calling about not feeling well. She said she is dizzy and lightheaded. Please call.

## 2017-03-08 NOTE — Telephone Encounter (Signed)
S/w pt she states that she has not taken her medications yet. She states that she will take as soon as we get off the phone. She will rest and call back if she does not feel better.

## 2017-04-26 ENCOUNTER — Encounter: Payer: Self-pay | Admitting: Cardiovascular Disease

## 2017-04-26 ENCOUNTER — Ambulatory Visit: Payer: Medicaid Other | Admitting: Cardiovascular Disease

## 2017-04-26 VITALS — BP 140/100 | HR 67 | Ht 68.5 in | Wt 238.0 lb

## 2017-04-26 DIAGNOSIS — E785 Hyperlipidemia, unspecified: Secondary | ICD-10-CM | POA: Diagnosis not present

## 2017-04-26 DIAGNOSIS — E668 Other obesity: Secondary | ICD-10-CM | POA: Diagnosis not present

## 2017-04-26 DIAGNOSIS — G4733 Obstructive sleep apnea (adult) (pediatric): Secondary | ICD-10-CM

## 2017-04-26 DIAGNOSIS — I2583 Coronary atherosclerosis due to lipid rich plaque: Secondary | ICD-10-CM

## 2017-04-26 DIAGNOSIS — I2510 Atherosclerotic heart disease of native coronary artery without angina pectoris: Secondary | ICD-10-CM | POA: Diagnosis not present

## 2017-04-26 DIAGNOSIS — J45909 Unspecified asthma, uncomplicated: Secondary | ICD-10-CM

## 2017-04-26 DIAGNOSIS — I10 Essential (primary) hypertension: Secondary | ICD-10-CM

## 2017-04-26 MED ORDER — LISINOPRIL 20 MG PO TABS: 20.0000 mg | ORAL_TABLET | Freq: Every day | ORAL | 3 refills | Status: DC

## 2017-04-26 NOTE — Progress Notes (Signed)
Patient ID: Amber Wong, female   DOB: June 06, 1956, 61 y.o.   MRN: [id number]     HPI: Amber Wong is a 61 y.o. female who presents to the office today for a 6 month cardiology evaluation.   [name] has a history of morbid obesity, hypertension, hyperlipidemia, and strong family history for CAD.  She was admitted to [facility] hospital in May 2015 with chest pain described as a "elephant "sitting on her chest.  I performed cardiac catheterization on 10/16/2013 and she was found to have hyperdynamic LV function and evidence for severe 80% distal left main stenosis with 50% diffuse proximal LAD stenosis and otherwise normal circumflex and RCA vessels.  She underwent CABG x3 revascularization surgery by Dr. Myles Lipps and had a LIMA placed to her LAD, SVG to obtuse marginal, and SVG to her diagonal vessel with Endo vascular vein harvesting from her right thigh.   She was seen by Tarri Fuller in October 2015 with complaints involving her left leg, knee, and great toe.  When I saw her in February 2016 she noted marked improvement in her previous chest pain symptomatology.  She has a history of hypertension and has been on Lopressor 25 g twice a day and lisinopril 10 mg daily.  She is on how you Purinol for gout.  She has GERD which is been controlled with Protonix 40 mg daily.  She has been on Lipitor 20 mg for hyperlipidemia.  When I  saw her in 2016 she had begun to notice left-sided chest and arm discomfort which may have had some similar characteristics than her chest pain prior to surgery. She underwent a nuclear perfusion study on 03/25/2015 which revealed normal perfusion.  Ejection fraction was 67%.  Her chest pain  resolved.    She saw Nell Range in June 2017 and because of recurrent somewhat atypical chest pain.  She underwent a repeat nuclear stress test which was done on 01/12/2016 and remained normal.  She has a history of asthma and is was started on  Advair.  She also has a history of obstructive sleep apnea and [employer] as her DME company.  Her last CPAP machine was in 2014.    When I last saw her in May 2018, blood pressure was improved with titration of her lisinopril.  HEENT in the emergency room in June 2018 with left upper chest and shoulder pain which was felt to be musculoskeletal rather than cardiac in etiology.  Her ECG was on change.  In August 2018.  An echo Doppler study showed an EF of 55-60%.  Grade 1 diastolic dysfunction.  There ascending aortic diameter was 39 mm.  PA pressure was 25 mmHg.  Recently, she states that she has not been using her CPAP on a daily basis and more often has only been using this every other day.  She admits to some fatigability.  She denies recurrent exertional chest pain, PND, orthopnea.  She denies palpitations.  She presents for reevaluation.   Past Medical History:  Diagnosis Date  . Asthma   . Depression   . Hypercholesteremia   . Hypertension   . Morbid obesity ([place])   . Nocturia   . Obstructive sleep apnea (adult) (pediatric) 12/27/2012   AHI 17.7 baseline, titrated to 6 cm , AHi now 2.4 - compliance   . OSA (obstructive sleep apnea)    w/ hypoventilation  . Sleep apnea     Past Surgical History:  Procedure Laterality Date  . BREAST  LUMPECTOMY Right    benign at age 24  . CORONARY ARTERY BYPASS GRAFTING (CABG) N/A 10/20/2013   Performed by Ivin Poot, MD at [place]  . CYSTECTOMY    . INTRAOPERATIVE TRANSESOPHAGEAL ECHOCARDIOGRAM N/A 10/20/2013   Performed by Ivin Poot, MD at [place]  . KNEE SURGERY     chronic pain and limping since  . LEFT HEART CATHETERIZATION WITH CORONARY ANGIOGRAM N/A 10/16/2013   Performed by Troy Sine, MD at [facility] CATH LAB  . PARTIAL HYSTERECTOMY    . TONSILLECTOMY      Allergies  Allergen Reactions  . Iohexol Other (See Comments)     Code: HIVES, Desc: premedicated 2 hrs prior with 262m soulumedrol IV, no reaction s/p injection,  Onset Date: [id number]  . Penicillins Other (See Comments)    REACTION: pruritis  . Shellfish Allergy Other (See Comments)    Cannot have because of gout    Current Outpatient Medications  Medication Sig Dispense Refill  . acyclovir (ZOVIRAX) 200 MG capsule Take 200 mg by mouth every morning.    .Marland Kitchenallopurinol (ZYLOPRIM) 100 MG tablet Take 100 mg by mouth 3 (three) times daily.    .Marland Kitchenaspirin 81 MG tablet Take 81 mg by mouth daily.     .Marland Kitchenatorvastatin (LIPITOR) 20 MG tablet Take 1 tablet (20 mg total) by mouth daily. 30 tablet 6  . buPROPion (WELLBUTRIN) 100 MG tablet Take 100 mg by mouth 2 (two) times daily.  0  . cholecalciferol (VITAMIN D) 1000 units tablet Take 1,000 Units by mouth daily.    . cromolyn (OPTICROM) 4 % ophthalmic solution Place 1 drop into both eyes 4 (four) times daily.  2  . fluconazole (DIFLUCAN) 150 MG tablet Take 1 tablet by mouth daily as needed. When yeast infection is present  1  . Fluticasone-Salmeterol (ADVAIR) 250-50 MCG/DOSE AEPB Inhale 1 puff into the lungs 2 (two) times daily.    .Marland Kitchenlisinopril (PRINIVIL,ZESTRIL) 20 MG tablet Take 1 tablet (20 mg total) daily by mouth. 90 tablet 3  . metoprolol tartrate (LOPRESSOR) 25 MG tablet Take 2 tablets in the morning and 1 tablet at night 90 tablet 11  . nitroGLYCERIN (NITROSTAT) 0.4 MG SL tablet Place 1 tablet (0.4 mg total) under the tongue every 5 (five) minutes as needed for chest pain (Max 3 doses in 15 minutes). 25 tablet 3  . pantoprazole (PROTONIX) 40 MG tablet TAKE 1 TABLET (40 MG TOTAL) BY MOUTH 2 (TWO) TIMES DAILY. 60 tablet 8  . pantoprazole (PROTONIX) 40 MG tablet TAKE 1 TABLET (40 MG TOTAL) BY MOUTH 2 (TWO) TIMES DAILY. 60 tablet 8  . PARoxetine (PAXIL) 20 MG tablet Take 20 mg by mouth every morning.    . Prenatal Vit-Fe Fumarate-FA (PRENATAL MULTIVITAMIN) TABS tablet Take 1 tablet by mouth daily at 12 noon.    . triamcinolone cream (KENALOG) 0.1 % Apply 1 application topically 2 (two) times daily.     No  current facility-administered medications for this visit.     Social History   Socioeconomic History  . Marital status: Legally Separated    Spouse name: Not on file  . Number of children: Not on file  . Years of education: 114 . Highest education level: Not on file  Social Needs  . Financial resource strain: Not on file  . Food insecurity - worry: Not on file  . Food insecurity - inability: Not on file  . Transportation needs - medical: Not  on file  . Transportation needs - non-medical: Not on file  Occupational History    Employer: UNEMPLOYED    Comment: history of shift work  Tobacco Use  . Smoking status: Former Smoker    Years: 15.00    Types: Cigarettes  . Smokeless tobacco: Never Used  . Tobacco comment: Quit 2005  Substance and Sexual Activity  . Alcohol use: No  . Drug use: No  . Sexual activity: No  Other Topics Concern  . Not on file  Social History Narrative   Patient lives at home alone.     Family History  Problem Relation Age of Onset  . Heart attack Mother 40       died of MI  . Heart attack Father 65       died of MI  . Cancer Sister        double mastectomy  . Heart attack Sister        2 sisters in their 33's with MI/stents    ROS General: Negative; No fevers, chills, or night sweats; positive for obesity HEENT: Negative; No changes in vision or hearing, sinus congestion, difficulty swallowing Pulmonary: Negative; No cough, wheezing, shortness of breath, hemoptysis Cardiovascular: See HPI:  GI: Negative; No nausea, vomiting, diarrhea, or abdominal pain GU: Negative; No dysuria, hematuria, or difficulty voiding Musculoskeletal: Negative; no myalgias, joint pain, or weakness Hematologic: Negative; no easy bruising, bleeding Rheumatologic: Positive for gout Endocrine: Negative; no heat/cold intolerance; no diabetes, Neuro: Negative; no changes in balance, headaches Skin: Negative; No rashes or skin lesions Psychiatric: Negative; No  behavioral problems, depression Sleep: Positive for obstructive sleep apnea No snoring,  daytime sleepiness, hypersomnolence, bruxism, restless legs, hypnogognic hallucinations. Other comprehensive 14 point system review is negative   Physical Exam BP (!) 140/100   Pulse 67   Ht 5' 8.5" (1.74 m)   Wt 238 lb (108 kg)   BMI 35.66 kg/m    Repeat BP by me was 138/88  Wt Readings from Last 3 Encounters:  04/26/17 238 lb (108 kg)  11/30/16 242 lb (109.8 kg)  10/26/16 243 lb (110.2 kg)   General: Alert, oriented, no distress.  Skin: normal turgor, no rashes, warm and dry HEENT: Normocephalic, atraumatic. Pupils equal round and reactive to light; sclera anicteric; extraocular muscles intact;  Nose without nasal septal hypertrophy Mouth/Parynx benign; Mallinpatti scale 3 Neck: No JVD, no carotid bruits; normal carotid upstroke Lungs: clear to ausculatation and percussion; no wheezing or rales Chest wall: without tenderness to palpitation Heart: PMI not displaced, RRR, s1 s2 normal, 1/6 systolic murmur, no diastolic murmur, no rubs, gallops, thrills, or heaves Abdomen: Central adiposity;soft, nontender; no hepatosplenomehaly, BS+; abdominal aorta nontender and not dilated by palpation. Back: no CVA tenderness Pulses 2+ Musculoskeletal: full range of motion, normal strength, no joint deformities Extremities: no clubbing cyanosis or edema, Homan's sign negative  Neurologic: grossly nonfocal; Cranial nerves grossly wnl Psychologic: Normal mood and affect   ECG (independently read by me): Normal sinus rhythm at 67 bpm.  Normal intervals.  Nondiagnostic T changes.  May 2018 ECG (independently read by me): normal sinus rhythm at 65 bpm.  Nonspecific T changes.  Normal intervals.  No ectopy.  January 2018 ECG (independently read by me): Normal sinus rhythm at 63 bpm.  No ectopy.  Nonspecific T changes.  Poor anterior R-wave progression.  March 19 2015 ECG (independently read by me):  Normal sinus rhythm at 60 bpm.  Nonspecific T changes.   PriorECG (independently read by  me): Normal sinus rhythm at 65 bpm.  Nonspecific T-wave changes V1 and V2.  Normal intervals.  LABS:  BMP Latest Ref Rng & Units 11/30/2016 07/07/2016 02/18/2016  Glucose 65 - 99 mg/dL 62(L) 74 81  BUN 6 - 20 mg/dL _0 Creatinine 0.44 - 1.00 mg/dL 0.77 0.78 1.04(H)  Sodium 135 - 145 mmol/L 140 141 140  Potassium 3.5 - 5.1 mmol/L 3.2(L) 3.5 3.1(L)  Chloride 101 - 111 mmol/L 106 103 105  CO2 22 - 32 mmol/L _1 Calcium 8.9 - 10.3 mg/dL 9.1 9.0 9.3   Hepatic Function Latest Ref Rng & Units 07/07/2016 06/25/2015 03/25/2015  Total Protein 6.1 - 8.1 g/dL 6.7 6.9 6.8  Albumin 3.6 - 5.1 g/dL 3.9 4.0 3.8  AST 10 - 35 U/L _2 ALT 6 - 29 U/L _3 Alk Phosphatase 33 - 130 U/L 144(H) 158(H) 145(H)  Total Bilirubin 0.2 - 1.2 mg/dL 0.5 0.4 0.4  Bilirubin, Direct <=0.2 mg/dL - 0.1 -    CBC Latest Ref Rng & Units 11/30/2016 07/07/2016 02/18/2016  WBC 4.0 - 10.5 K/uL 7.5 6.1 7.0  Hemoglobin 12.0 - 15.0 g/dL 14.0 14.2 15.0  Hematocrit 36.0 - 46.0 % 41.4 41.6 43.5  Platelets 150 - 400 K/uL 245 266 262    BNP    Component Value Date/Time   BNP 148.4 (H) 11/30/2016 1750    ProBNP    Component Value Date/Time   PROBNP 41.4 10/15/2013 0802   Lipid Panel     Component Value Date/Time   CHOL 139 07/07/2016 1501   TRIG 82 07/07/2016 1501   HDL 49 (L) 07/07/2016 1501   CHOLHDL 2.8 07/07/2016 1501   VLDL 16 07/07/2016 1501   LDLCALC 74 07/07/2016 1501     RADIOLOGY: No results found.   IMPRESSION:  1. Coronary artery disease due to lipid rich plaque   2. Essential hypertension   3. OSA (obstructive sleep apnea)   4. Moderate obesity   5. Hyperlipidemia LDL goal <70   6. Mild asthma without complication, unspecified whether persistent      ASSESSMENT AND PLAN: Ms.Ousmanealhassan is a 61 year-old female who was found to have severe coronary obstructive disease at diagnostic  catheterization in May 2015 and underwent CABG revascularization surgery 3 to her left coronary system.  Her RCA was not bypassed.  She developed left-sided and chest discomfort and  was concerned that this may be similar to her prior discomfor in 2016. A Lexiscan Myoview study revealed normal perfusion.  She underwent an additional nuclear study for recurrent chest pain in August 2017 which again remained normal. .  He has had issues with continued blood pressure elevation agents have been titrated.  Her blood pressure today continues to be elevated and initially was 140/100 and on repeat by me was 138/88 on a regimen consisting of lisinopril 15 mg, metoprolol 50 mg in the morning and 25 mg at night.  I have recommended further titration of lisinopril to 20 mg for more optimal blood pressure control.she continues to be on atorvastatin 20 mg for hyperlipidemia.Marland Kitchen  LDL in January 2018 was 74. She has obstructive sleep apnea.  I discussed with her the importance of using her CPAP on a daily basis rather than her recent use of sleep apnea will undoubtedly be 2 blood per elevated.  RD, but this is controlled with pantoprazole.  She has not had any recent exacerbation of asthma.  My recommendations week  for minimum of 30 minutes.  I recommended that she follow-up with Almyra Deforest, PA in 6 months and as long as she is stable, I will see her in one year for reevaluation. Time spent: 25 minutes  Troy Sine, MD, [facility]  04/28/2017 5:28 PM

## 2017-04-26 NOTE — Patient Instructions (Signed)
Medication Instructions:  INCREASE lisinopril to 20 mg (1 tablet) daily  Follow-Up: Your physician wants you to follow-up in: 6 months with Almyra Deforest PA and 12 months with Dr. Claiborne Billings. You will receive a reminder letter in the mail two months in advance. If you don't receive a letter, please call our office to schedule the follow-up appointment.   Any Other Special Instructions Will Be Listed Below (If Applicable).     If you need a refill on your cardiac medications before your next appointment, please call your pharmacy.

## 2017-04-28 ENCOUNTER — Encounter: Payer: Self-pay | Admitting: Cardiovascular Disease

## 2017-07-08 ENCOUNTER — Other Ambulatory Visit: Payer: Self-pay | Admitting: Cardiovascular Disease

## 2017-07-27 ENCOUNTER — Telehealth: Payer: Self-pay | Admitting: Cardiovascular Disease

## 2017-07-27 NOTE — Telephone Encounter (Signed)
Did not need this encounter-sw

## 2017-08-27 ENCOUNTER — Ambulatory Visit: Payer: Medicaid Other | Admitting: Physician Assistant

## 2017-09-07 ENCOUNTER — Encounter: Payer: Self-pay | Admitting: Physician Assistant

## 2017-09-07 ENCOUNTER — Ambulatory Visit: Payer: Medicaid Other | Admitting: Physician Assistant

## 2017-09-07 VITALS — BP 141/86 | HR 61 | Ht 68.0 in | Wt 233.0 lb

## 2017-09-07 DIAGNOSIS — I10 Essential (primary) hypertension: Secondary | ICD-10-CM | POA: Diagnosis not present

## 2017-09-07 DIAGNOSIS — R002 Palpitations: Secondary | ICD-10-CM

## 2017-09-07 DIAGNOSIS — G4734 Idiopathic sleep related nonobstructive alveolar hypoventilation: Secondary | ICD-10-CM

## 2017-09-07 DIAGNOSIS — G4733 Obstructive sleep apnea (adult) (pediatric): Secondary | ICD-10-CM

## 2017-09-07 DIAGNOSIS — I25119 Atherosclerotic heart disease of native coronary artery with unspecified angina pectoris: Secondary | ICD-10-CM | POA: Diagnosis not present

## 2017-09-07 DIAGNOSIS — J449 Chronic obstructive pulmonary disease, unspecified: Secondary | ICD-10-CM

## 2017-09-07 DIAGNOSIS — E785 Hyperlipidemia, unspecified: Secondary | ICD-10-CM | POA: Diagnosis not present

## 2017-09-07 MED ORDER — NITROGLYCERIN 0.4 MG SL SUBL: 0.4000 mg | SUBLINGUAL_TABLET | SUBLINGUAL | 3 refills | Status: DC | PRN

## 2017-09-07 NOTE — Progress Notes (Signed)
Cardiology Office Note    Date:  09/08/2017   ID:  Amber Wong, DOB [birth year], MRN [id number]  PCP:  Everardo Beals, NP  Cardiologist:  Dr. Claiborne Billings   Chief Complaint  Patient presents with  . Follow-up    seen for Dr. Claiborne Billings. Occasional chest pain    History of Present Illness:  Amber Wong is a 62 y.o. female with PMH of HTN, HLD, morbid obesity, OSA on CPAP and CAD.  Patient was admitted in May 2015 with chest pain, cardiac catheterization on 10/16/2013 showed hyperdynamic LV function with evidence for severe 80% distal left main, 50% diffuse proximal LAD, otherwise normal left circumflex and RCA vessel.  She underwent CABG x3 by Dr. Darcey Nora and had LIMA to LAD, SVG to OM, SVG to diagonal.  Myoview in October 2016 showed normal perfusion, EF 67%.  She had another Myoview on 01/12/2016 which was normal.  Last echocardiogram in August 2018 showed EF 55-60%, grade 1 DD, a sending aortic diameter 39 mm, 25 mmHg PA peak pressure.  Patient presents today for cardiology office visit.  She has been having some intermittent chest discomfort for the past several month.  She actually complaining of 2 area of discomfort, one is on the left flank side.  The chest pain seems to be very focal in nature.  And she also have a right-sided chest pain as well.  Her previous angina in 2015 was elevated sitting on the chest.  Her recent chest pain seems to be very atypical.  Recent EKG did not show any obvious changes and her symptoms does not seem to occur with ambulation.  I recommend a Lexiscan Myoview given the atypical nature of her symptoms.   Past Medical History:  Diagnosis Date  . Asthma   . Depression   . Hypercholesteremia   . Hypertension   . Morbid obesity ([place])   . Nocturia   . Obstructive sleep apnea (adult) (pediatric) 12/27/2012   AHI 17.7 baseline, titrated to 6 cm , AHi now 2.4 - compliance   . OSA (obstructive sleep apnea)    w/ hypoventilation  . Sleep apnea       Past Surgical History:  Procedure Laterality Date  . BREAST LUMPECTOMY Right    benign at age 74  . CORONARY ARTERY BYPASS GRAFT N/A 10/20/2013   Procedure: CORONARY ARTERY BYPASS GRAFTING (CABG);  Surgeon: Ivin Poot, MD;  Location: [facility];  Service: Open Heart Surgery;  Laterality: N/A;  CABG x 3  . CYSTECTOMY    . INTRAOPERATIVE TRANSESOPHAGEAL ECHOCARDIOGRAM N/A 10/20/2013   Procedure: INTRAOPERATIVE TRANSESOPHAGEAL ECHOCARDIOGRAM;  Surgeon: Ivin Poot, MD;  Location: [facility];  Service: Open Heart Surgery;  Laterality: N/A;  . KNEE SURGERY     chronic pain and limping since  . LEFT HEART CATHETERIZATION WITH CORONARY ANGIOGRAM N/A 10/16/2013   Procedure: LEFT HEART CATHETERIZATION WITH CORONARY ANGIOGRAM;  Surgeon: Troy Sine, MD;  Location: [facility] CATH LAB;  Service: Cardiovascular;  Laterality: N/A;  . PARTIAL HYSTERECTOMY    . TONSILLECTOMY      Current Medications: Outpatient Medications Prior to Visit  Medication Sig Dispense Refill  . acyclovir (ZOVIRAX) 200 MG capsule Take 200 mg by mouth every morning.    Marland Kitchen allopurinol (ZYLOPRIM) 100 MG tablet Take 100 mg by mouth 3 (three) times daily.    Marland Kitchen aspirin 81 MG tablet Take 81 mg by mouth daily.     Marland Kitchen atorvastatin (LIPITOR) 20 MG tablet Take 1 tablet (20 mg  total) by mouth daily. 30 tablet 6  . buPROPion (WELLBUTRIN) 100 MG tablet Take 100 mg by mouth 2 (two) times daily.  0  . cholecalciferol (VITAMIN D) 1000 units tablet Take 1,000 Units by mouth daily.    . cromolyn (OPTICROM) 4 % ophthalmic solution Place 1 drop into both eyes 4 (four) times daily.  2  . fluconazole (DIFLUCAN) 150 MG tablet Take 1 tablet by mouth daily as needed. When yeast infection is present  1  . Fluticasone-Salmeterol (ADVAIR) 250-50 MCG/DOSE AEPB Inhale 1 puff into the lungs 2 (two) times daily.    Marland Kitchen lisinopril (PRINIVIL,ZESTRIL) 20 MG tablet Take 1 tablet (20 mg total) daily by mouth. 90 tablet 3  . meloxicam (MOBIC) 15 MG tablet Take 15 mg by  mouth daily.    . metoprolol tartrate (LOPRESSOR) 25 MG tablet TAKE 2 TABLETS IN THE MORNING AND 1 TABLET AT NIGHT 90 tablet 10  . pantoprazole (PROTONIX) 40 MG tablet TAKE 1 TABLET (40 MG TOTAL) BY MOUTH 2 (TWO) TIMES DAILY. 60 tablet 8  . PARoxetine (PAXIL) 20 MG tablet Take 20 mg by mouth every morning.    . triamcinolone cream (KENALOG) 0.1 % Apply 1 application topically 2 (two) times daily.    . nitroGLYCERIN (NITROSTAT) 0.4 MG SL tablet Place 1 tablet (0.4 mg total) under the tongue every 5 (five) minutes as needed for chest pain (Max 3 doses in 15 minutes). 25 tablet 3  . Prenatal Vit-Fe Fumarate-FA (PRENATAL MULTIVITAMIN) TABS tablet Take 1 tablet by mouth daily at 12 noon.    . pantoprazole (PROTONIX) 40 MG tablet TAKE 1 TABLET (40 MG TOTAL) BY MOUTH 2 (TWO) TIMES DAILY. (Patient not taking: Reported on 09/07/2017) 60 tablet 8   No facility-administered medications prior to visit.      Allergies:   Iohexol; Penicillins; and Shellfish allergy   Social History   Socioeconomic History  . Marital status: Legally Separated    Spouse name: Not on file  . Number of children: Not on file  . Years of education: 29  . 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 Financial resource strain: Not on file  . Food insecurity:    Worry: Not on file    Inability: Not on file  . Transportation needs:    Medical: Not on file    Non-medical: Not on file  Tobacco Use  . Smoking status: Former Smoker    Years: 15.00    Types: Cigarettes  . Smokeless tobacco: Never Used  . Tobacco comment: Quit 2005  Substance and Sexual Activity  . Alcohol use: No  . Drug use: No  . Sexual activity: Never  Lifestyle  . Physical activity:    Days per week: Not on file    Minutes per session: Not on file  . Stress: Not on file  Relationships  . Social connections:    Talks on phone: Not on file    Gets  together: Not on file    Attends religious service: Not on file    Active member of club or organization: Not on file    Attends meetings of clubs or organizations: Not on file    Relationship status: Not on file  Other Topics Concern  . Not on file  Social History Narrative   Patient lives at home alone.      Family History:  The  patient's family history includes Cancer in her sister; Heart attack in her sister; Heart attack (age of onset: 6) in her mother; Heart attack (age of onset: 35) in her father.   ROS:   Please see the history of present illness.    ROS All other systems reviewed and are negative.   PHYSICAL EXAM:   VS:  BP (!) 141/86   Pulse 61   Ht 5\' 8"  (1.727 m)   Wt 233 lb (105.7 kg)   BMI 35.43 kg/m    GEN: Well nourished, well developed, in no acute distress  HEENT: normal  Neck: no JVD, carotid bruits, or masses Cardiac: RRR; no murmurs, rubs, or gallops,no edema  Respiratory:  clear to auscultation bilaterally, normal work of breathing GI: soft, nontender, nondistended, + BS MS: no deformity or atrophy  Skin: warm and dry, no rash Neuro:  Alert and Oriented x 3, Strength and sensation are intact Psych: euthymic mood, full affect  Wt Readings from Last 3 Encounters:  09/07/17 233 lb (105.7 kg)  04/26/17 238 lb (108 kg)  11/30/16 242 lb (109.8 kg)      Studies/Labs Reviewed:   EKG:  EKG is not ordered today.    Recent Labs: 10/26/2016: TSH 1.25 11/30/2016: B Natriuretic Peptide 148.4; BUN 9; Creatinine, Ser 0.77; Hemoglobin 14.0; Platelets 245; Potassium 3.2; Sodium 140   Lipid Panel    Component Value Date/Time   CHOL 139 07/07/2016 1501   TRIG 82 07/07/2016 1501   HDL 49 (L) 07/07/2016 1501   CHOLHDL 2.8 07/07/2016 1501   VLDL 16 07/07/2016 1501   LDLCALC 74 07/07/2016 1501    Additional studies/ records that were reviewed today include:   Echo 01/12/2017 LV EF: 55% -    60%  ------------------------------------------------------------------- Indications:      Chest pain (R07.89).  ------------------------------------------------------------------- History:   PMH:   Dyspnea.  Coronary artery disease.  Congestive heart failure.  Chronic obstructive pulmonary disease.  Risk factors:  OSA. ETOH. Hypertension. Obese. Dyslipidemia.  ------------------------------------------------------------------- Study Conclusions  - Left ventricle: The cavity size was normal. Wall thickness was   normal. Systolic function was normal. The estimated ejection   fraction was in the range of 55% to 60%. Although no diagnostic   regional wall motion abnormality was identified, this possibility   cannot be completely excluded on the basis of this study. Doppler   parameters are consistent with abnormal left ventricular   relaxation (grade 1 diastolic dysfunction). - Aortic valve: There was no stenosis. - Aorta: Ascending aortic diameter: 39 mm (S). - Ascending aorta: The ascending aorta was mildly dilated. - Mitral valve: There was trivial regurgitation. - Right ventricle: The cavity size was mildly dilated. Systolic   function was mildly reduced. - Pulmonary arteries: PA peak pressure: 25 mm Hg (S). - Inferior vena cava: The vessel was normal in size. The   respirophasic diameter changes were in the normal range (>= 50%),   consistent with normal central venous pressure.  Impressions:  - Normal LV size and systolic function, EF 58-09%. Normal RV size   and systolic function. No significant valvular abnormalities.   ASSESSMENT:    1. Coronary artery disease involving native coronary artery of native heart with angina pectoris ([place])   2. Hyperlipidemia with target LDL less than 70   3. Palpitation   4. Obstructive sleep apnea   5. Nocturnal hypoxemia   6. Chronic obstructive pulmonary disease, unspecified COPD type ([place])   7. Essential hypertension  PLAN:  In order of problems listed above:  1. CAD: She has been having some atypical chest pain, there is actually 2 area of chest pain the past several month.  Recent EKG did not show any ischemic changes.  Her chest pains does not seems to occur with exertion.  Recommended a Lexiscan Myoview  2. Hypertension: Blood pressure mildly elevated today, will hold off on increasing blood pressure medication.  She only took 10 mg lisinopril today instead of 20 mg daily.  3. Hyperlipidemia: On Lipitor 20 mg daily. Lipid panel obtained in January 2018 was very well controlled.  She is due for fasting lipid panel and LFT.  4. Morbid obesity: Ultimately I think weight loss will help with her symptoms.    5. Obstructive sleep apnea: She requested some additional equipment.    Medication Adjustments/Labs and Tests Ordered: Current medicines are reviewed at length with the patient today.  Concerns regarding medicines are outlined above.  Medication changes, Labs and Tests ordered today are listed in the Patient Instructions below. Patient Instructions  Medication Instructions:  Your physician recommends that you continue on your current medications as directed. Please refer to the Current Medication list given to you today.  Labwork: Your physician recommends that you return for lab work in: on same day as Lexiscan-FASTING  Testing/Procedures: Your physician has requested that you have a lexiscan myoview. For further information please visit [email or web address]. Please follow instruction sheet, as given.  Follow-Up: Your physician wants you to follow-up in: 6 months with Dr Claiborne Billings. You will receive a reminder letter in the mail two months in advance. If you don't receive a letter, please call our office to schedule the follow-up appointment.  Any Other Special Instructions Will Be Listed Below (If Applicable) If you need a refill on your cardiac medications before your next appointment,  please call your pharmacy.     Hilbert Corrigan, [place]  09/08/2017 11:38 AM    [address], [place], [place]  [address] Phone: [phone]; Fax: [phone]

## 2017-09-07 NOTE — Patient Instructions (Signed)
Medication Instructions:  Your physician recommends that you continue on your current medications as directed. Please refer to the Current Medication list given to you today.  Labwork: Your physician recommends that you return for lab work in: on same day as Lexiscan-FASTING  Testing/Procedures: Your physician has requested that you have a lexiscan myoview. For further information please visit [email or web address]. Please follow instruction sheet, as given.  Follow-Up: Your physician wants you to follow-up in: 6 months with Dr Claiborne Billings. You will receive a reminder letter in the mail two months in advance. If you don't receive a letter, please call our office to schedule the follow-up appointment.  Any Other Special Instructions Will Be Listed Below (If Applicable) If you need a refill on your cardiac medications before your next appointment, please call your pharmacy.

## 2017-09-08 ENCOUNTER — Encounter: Payer: Self-pay | Admitting: Physician Assistant

## 2017-09-13 ENCOUNTER — Telehealth (HOSPITAL_COMMUNITY): Payer: Self-pay

## 2017-09-13 NOTE — Telephone Encounter (Signed)
Encounter complete. 

## 2017-09-18 ENCOUNTER — Inpatient Hospital Stay (HOSPITAL_COMMUNITY): Admission: RE | Admit: 2017-09-18 | Payer: Medicaid Other | Source: Ambulatory Visit

## 2017-09-19 ENCOUNTER — Ambulatory Visit (HOSPITAL_COMMUNITY): Payer: Medicaid Other

## 2017-09-27 ENCOUNTER — Telehealth (HOSPITAL_COMMUNITY): Payer: Self-pay

## 2017-09-27 NOTE — Telephone Encounter (Signed)
Encounter complete. 

## 2017-09-28 ENCOUNTER — Telehealth (HOSPITAL_COMMUNITY): Payer: Self-pay

## 2017-10-02 ENCOUNTER — Ambulatory Visit (HOSPITAL_COMMUNITY)
Admission: RE | Admit: 2017-10-02 | Discharge: 2017-10-02 | Disposition: A | Discharge status: Home / Self Care | Payer: Medicaid Other | Source: Ambulatory Visit | Attending: Cardiology | Admitting: Cardiology

## 2017-10-02 DIAGNOSIS — R002 Palpitations: Secondary | ICD-10-CM | POA: Insufficient documentation

## 2017-10-02 DIAGNOSIS — Z87891 Personal history of nicotine dependence: Secondary | ICD-10-CM | POA: Insufficient documentation

## 2017-10-02 DIAGNOSIS — R5383 Other fatigue: Secondary | ICD-10-CM | POA: Insufficient documentation

## 2017-10-02 DIAGNOSIS — I110 Hypertensive heart disease with heart failure: Secondary | ICD-10-CM | POA: Insufficient documentation

## 2017-10-02 DIAGNOSIS — J449 Chronic obstructive pulmonary disease, unspecified: Secondary | ICD-10-CM | POA: Insufficient documentation

## 2017-10-02 DIAGNOSIS — I2510 Atherosclerotic heart disease of native coronary artery without angina pectoris: Secondary | ICD-10-CM | POA: Insufficient documentation

## 2017-10-02 DIAGNOSIS — K219 Gastro-esophageal reflux disease without esophagitis: Secondary | ICD-10-CM | POA: Insufficient documentation

## 2017-10-02 DIAGNOSIS — Z8249 Family history of ischemic heart disease and other diseases of the circulatory system: Secondary | ICD-10-CM | POA: Insufficient documentation

## 2017-10-02 DIAGNOSIS — Z951 Presence of aortocoronary bypass graft: Secondary | ICD-10-CM | POA: Insufficient documentation

## 2017-10-02 LAB — HEPATIC FUNCTION PANEL
ALBUMIN: 4.3 g/dL (ref 3.6–4.8)
ALK PHOS: 162 IU/L — AB (ref 39–117)
ALT: 23 IU/L (ref 0–32)
AST: 25 IU/L (ref 0–40)
Bilirubin Total: 0.4 mg/dL (ref 0.0–1.2)
Bilirubin, Direct: 0.15 mg/dL (ref 0.00–0.40)
TOTAL PROTEIN: 7.1 g/dL (ref 6.0–8.5)

## 2017-10-02 LAB — LIPID PANEL
CHOL/HDL RATIO: 3 ratio (ref 0.0–4.4)
CHOLESTEROL TOTAL: 170 mg/dL (ref 100–199)
HDL: 56 mg/dL (ref >39)
LDL CALC: 97 mg/dL (ref 0–99)
TRIGLYCERIDES: 86 mg/dL (ref 0–149)
VLDL CHOLESTEROL CAL: 17 mg/dL (ref 5–40)

## 2017-10-02 MED ORDER — REGADENOSON 0.4 MG/5ML IV SOLN
0.4000 mg | Freq: Once | INTRAVENOUS | Status: AC
  Administered 2017-10-02: 0.4 mg via INTRAVENOUS

## 2017-10-02 MED ORDER — TECHNETIUM TC 99M TETROFOSMIN IV KIT
27.4000 | PACK | Freq: Once | INTRAVENOUS | Status: AC | PRN
  Administered 2017-10-02: 27.4 via INTRAVENOUS
  Filled 2017-10-02: qty 28

## 2017-10-03 ENCOUNTER — Ambulatory Visit (HOSPITAL_COMMUNITY)
Admission: RE | Admit: 2017-10-03 | Discharge: 2017-10-03 | Disposition: A | Discharge status: Home / Self Care | Payer: Medicaid Other | Source: Ambulatory Visit | Attending: Internal Medicine | Admitting: Internal Medicine

## 2017-10-03 LAB — MYOCARDIAL PERFUSION IMAGING
CHL CUP NUCLEAR SDS: 3
CHL CUP NUCLEAR SRS: 0
LV dias vol: 58 mL (ref 46–106)
LVSYSVOL: 22 mL
Peak HR: 96 {beats}/min
Rest HR: 63 {beats}/min
SSS: 3
TID: 1.03

## 2017-10-03 MED ORDER — TECHNETIUM TC 99M TETROFOSMIN IV KIT
29.6000 | PACK | Freq: Once | INTRAVENOUS | Status: AC | PRN
  Administered 2017-10-03: 29.6 via INTRAVENOUS

## 2017-10-03 NOTE — Progress Notes (Signed)
Normal stress test with very good pumping function of heart.

## 2017-10-03 NOTE — Progress Notes (Signed)
Liver function normal. Bad cholesterol LDL (97) elevated. Alkaline phosphatase chronically elevated.

## 2017-10-08 ENCOUNTER — Telehealth: Payer: Self-pay | Admitting: Physician Assistant

## 2017-10-08 NOTE — Telephone Encounter (Signed)
Patient called back and was given lab results; she voiced understanding.

## 2017-10-08 NOTE — Telephone Encounter (Signed)
Follow Up:   Returning your call from today,concerning her test results.

## 2017-12-14 NOTE — Telephone Encounter (Signed)
Encounter complete. 

## 2017-12-25 ENCOUNTER — Encounter: Payer: Self-pay | Admitting: *Deleted

## 2018-01-19 ENCOUNTER — Other Ambulatory Visit: Payer: Self-pay | Admitting: Physician Assistant

## 2018-03-06 ENCOUNTER — Ambulatory Visit: Payer: Medicaid Other | Admitting: Cardiovascular Disease

## 2018-03-06 ENCOUNTER — Encounter: Payer: Self-pay | Admitting: Cardiovascular Disease

## 2018-03-06 VITALS — BP 155/96 | HR 66 | Ht 68.0 in | Wt 234.2 lb

## 2018-03-06 DIAGNOSIS — Z6835 Body mass index (BMI) 35.0-35.9, adult: Secondary | ICD-10-CM

## 2018-03-06 DIAGNOSIS — I10 Essential (primary) hypertension: Secondary | ICD-10-CM

## 2018-03-06 DIAGNOSIS — I2583 Coronary atherosclerosis due to lipid rich plaque: Secondary | ICD-10-CM | POA: Diagnosis not present

## 2018-03-06 DIAGNOSIS — G4733 Obstructive sleep apnea (adult) (pediatric): Secondary | ICD-10-CM

## 2018-03-06 DIAGNOSIS — E785 Hyperlipidemia, unspecified: Secondary | ICD-10-CM | POA: Diagnosis not present

## 2018-03-06 DIAGNOSIS — I2510 Atherosclerotic heart disease of native coronary artery without angina pectoris: Secondary | ICD-10-CM | POA: Diagnosis not present

## 2018-03-06 DIAGNOSIS — Z79899 Other long term (current) drug therapy: Secondary | ICD-10-CM

## 2018-03-06 MED ORDER — ATORVASTATIN CALCIUM 40 MG PO TABS: 40.0000 mg | ORAL_TABLET | Freq: Every day | ORAL | 3 refills | Status: DC

## 2018-03-06 MED ORDER — LISINOPRIL 20 MG PO TABS: 30.0000 mg | ORAL_TABLET | Freq: Every day | ORAL | 3 refills | Status: DC

## 2018-03-06 NOTE — Progress Notes (Signed)
Patient ID: Amber Wong, female   DOB: [birth year], 62 y.o.   MRN: [id number]     HPI: Amber Wong is a 62 y.o. female who presents to the office today for a 10 month cardiology evaluation.   Amber Wong has a history of morbid obesity, hypertension, hyperlipidemia, and strong family history for CAD.  She was admitted to [facility] hospital in May 2015 with chest pain described as a "elephant "sitting on her chest.  I performed cardiac catheterization on 10/16/2013 and she was found to have hyperdynamic LV function and evidence for severe 80% distal left main stenosis with 50% diffuse proximal LAD stenosis and otherwise normal circumflex and RCA vessels.  She underwent CABG x3 revascularization surgery by Dr. Myles Lipps and had a LIMA placed to her LAD, SVG to obtuse marginal, and SVG to her diagonal vessel with Endo vascular vein harvesting from her right thigh.   She was seen by Tarri Fuller in October 2015 with complaints involving her left leg, knee, and great toe.  When I saw her in February 2016 she noted marked improvement in her previous chest pain symptomatology.  She has a history of hypertension and has been on Lopressor 25 g twice a day and lisinopril 10 mg daily.  She is on how you Purinol for gout.  She has GERD which is been controlled with Protonix 40 mg daily.  She has been on Lipitor 20 mg for hyperlipidemia.  When I  saw her in 2016 she had begun to notice left-sided chest and arm discomfort which may have had some similar characteristics than her chest pain prior to surgery. She underwent a nuclear perfusion study on 03/25/2015 which revealed normal perfusion.  Ejection fraction was 67%.  Her chest pain  resolved.    She saw Nell Range in June 2017 and because of recurrent somewhat atypical chest pain.  She underwent a repeat nuclear stress test which was done on 01/12/2016 and remained normal.  She has a history of asthma and is was started on  Advair.  She also has a history of obstructive sleep apnea and [employer] as her DME company.  Her last CPAP machine was in 2014.    When I saw her in May 2018, blood pressure was improved with titration of her lisinopril.  HEENT in the emergency room in June 2018 with left upper chest and shoulder pain which was felt to be musculoskeletal rather than cardiac in etiology.  Her ECG was on change.  In August 2018.  An echo Doppler study showed an EF of 55-60%.  Grade 1 diastolic dysfunction.  There ascending aortic diameter was 39 mm.  PA pressure was 25 mmHg.    Since I last saw her she admits that she has been  under significant increased stress. She was seen by Mammie Russian in March 2019 and at that time had complaints of intermittent chest discomfort for several months.  Her chest pain had atypical features.  She was referred for nuclear perfusion study which was done on October 02, 2017 and was low risk which showed normal perfusion and function.  EF was 63%.  He had repeat laboratory which showed a cholesterol of 170, triglycerides 86, HDL 56, LDL 97.  Last saw her she was not using CPAP consistently.  I strongly reiterated the importance of continued treatment.  She now admits that she is using CPAP in excess of 90% of the night.  Her sleeping has improved.  She presents for evaluation.  Past Medical History:  Diagnosis Date  . Asthma   . Depression   . Hypercholesteremia   . Hypertension   . Morbid obesity ([place])   . Nocturia   . Obstructive sleep apnea (adult) (pediatric) 12/27/2012   AHI 17.7 baseline, titrated to 6 cm , AHi now 2.4 - compliance   . OSA (obstructive sleep apnea)    w/ hypoventilation  . Sleep apnea     Past Surgical History:  Procedure Laterality Date  . BREAST LUMPECTOMY Right    benign at age 7  . CORONARY ARTERY BYPASS GRAFT N/A 10/20/2013   Procedure: CORONARY ARTERY BYPASS GRAFTING (CABG);  Surgeon: Ivin Poot, MD;  Location: [place];  Service: Open Heart  Surgery;  Laterality: N/A;  CABG x 3  . CYSTECTOMY    . INTRAOPERATIVE TRANSESOPHAGEAL ECHOCARDIOGRAM N/A 10/20/2013   Procedure: INTRAOPERATIVE TRANSESOPHAGEAL ECHOCARDIOGRAM;  Surgeon: Ivin Poot, MD;  Location: [place];  Service: Open Heart Surgery;  Laterality: N/A;  . KNEE SURGERY     chronic pain and limping since  . LEFT HEART CATHETERIZATION WITH CORONARY ANGIOGRAM N/A 10/16/2013   Procedure: LEFT HEART CATHETERIZATION WITH CORONARY ANGIOGRAM;  Surgeon: Troy Sine, MD;  Location: [facility] CATH LAB;  Service: Cardiovascular;  Laterality: N/A;  . PARTIAL HYSTERECTOMY    . TONSILLECTOMY      Allergies  Allergen Reactions  . Iohexol Other (See Comments)     Code: HIVES, Desc: premedicated 2 hrs prior with 245m soulumedrol IV, no reaction s/p injection, Onset Date: [id number]  . Penicillins Other (See Comments)    REACTION: pruritis  . Shellfish Allergy Other (See Comments)    Cannot have because of gout    Current Outpatient Medications  Medication Sig Dispense Refill  . acyclovir (ZOVIRAX) 200 MG capsule Take 200 mg by mouth every morning.    .Marland Kitchenallopurinol (ZYLOPRIM) 100 MG tablet Take 100 mg by mouth 3 (three) times daily.    .Marland Kitchenaspirin 81 MG tablet Take 81 mg by mouth daily.     .Marland Kitchenatorvastatin (LIPITOR) 40 MG tablet Take 1 tablet (40 mg total) by mouth daily. 90 tablet 3  . buPROPion (WELLBUTRIN) 100 MG tablet Take 100 mg by mouth 2 (two) times daily.  0  . cholecalciferol (VITAMIN D) 1000 units tablet Take 1,000 Units by mouth daily.    . cromolyn (OPTICROM) 4 % ophthalmic solution Place 1 drop into both eyes 4 (four) times daily.  2  . fluconazole (DIFLUCAN) 150 MG tablet Take 1 tablet by mouth daily as needed. When yeast infection is present  1  . Fluticasone-Salmeterol (ADVAIR) 250-50 MCG/DOSE AEPB Inhale 1 puff into the lungs 2 (two) times daily.    .Marland Kitchenlisinopril (PRINIVIL,ZESTRIL) 20 MG tablet Take 1.5 tablets (30 mg total) by mouth daily. 135 tablet 3  . meloxicam (MOBIC)  15 MG tablet Take 15 mg by mouth daily.    . metoprolol tartrate (LOPRESSOR) 25 MG tablet TAKE 2 TABLETS IN THE MORNING AND 1 TABLET AT NIGHT 90 tablet 10  . nitroGLYCERIN (NITROSTAT) 0.4 MG SL tablet Place 1 tablet (0.4 mg total) under the tongue every 5 (five) minutes as needed for chest pain (Max 3 doses in 15 minutes). 25 tablet 3  . pantoprazole (PROTONIX) 40 MG tablet TAKE 1 TABLET BY MOUTH TWICE A DAY 60 tablet 2  . PARoxetine (PAXIL) 20 MG tablet Take 20 mg by mouth every morning.    . triamcinolone cream (KENALOG) 0.1 % Apply 1  application topically 2 (two) times daily.     No current facility-administered medications for this visit.     Social History   Socioeconomic History  . Marital status: Legally Separated    Spouse name: Not on file  . Number of children: Not on file  . Years of education: 47  . 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 Financial resource strain: Not on file  . Food insecurity:    Worry: Not on file    Inability: Not on file  . Transportation needs:    Medical: Not on file    Non-medical: Not on file  Tobacco Use  . Smoking status: Former Smoker    Years: 15.00    Types: Cigarettes  . Smokeless tobacco: Never Used  . Tobacco comment: Quit 2005  Substance and Sexual Activity  . Alcohol use: No  . Drug use: No  . Sexual activity: Never  Lifestyle  . Physical activity:    Days per week: Not on file    Minutes per session: Not on file  . Stress: Not on file  Relationships  . Social connections:    Talks on phone: Not on file    Gets together: Not on file    Attends religious service: Not on file    Active member of club or organization: Not on file    Attends meetings of clubs or organizations: Not on file    Relationship status: Not on file  . Intimate partner violence:    Fear of current or ex partner: Not on file    Emotionally abused: Not  on file    Physically abused: Not on file    Forced sexual activity: Not on file  Other Topics Concern  . Not on file  Social History Narrative   Patient lives at home alone.     Family History  Problem Relation Age of Onset  . Heart attack Mother 11       died of MI  . Heart attack Father 63       died of MI  . Cancer Sister        double mastectomy  . Heart attack Sister        2 sisters in their 4's with MI/stents    ROS General: Negative; No fevers, chills, or night sweats; positive for obesity HEENT: Negative; No changes in vision or hearing, sinus congestion, difficulty swallowing Pulmonary: Negative; No cough, wheezing, shortness of breath, hemoptysis Cardiovascular: See HPI:  GI: Negative; No nausea, vomiting, diarrhea, or abdominal pain GU: Negative; No dysuria, hematuria, or difficulty voiding Musculoskeletal: Negative; no myalgias, joint pain, or weakness Hematologic: Negative; no easy bruising, bleeding Rheumatologic: Positive for gout Endocrine: Negative; no heat/cold intolerance; no diabetes, Neuro: Negative; no changes in balance, headaches Skin: Negative; No rashes or skin lesions Psychiatric: Negative; No behavioral problems, depression Sleep: Positive for obstructive sleep apnea No snoring,  daytime sleepiness, hypersomnolence, bruxism, restless legs, hypnogognic hallucinations. Other comprehensive 14 point system review is negative   Physical Exam BP (!) 155/96   Pulse 66   Ht '5\' 8"'$  (1.727 m)   Wt 234 lb 3.2 oz (106.2 kg)   BMI 35.61 kg/m    Repeat blood pressure by me 132/82  Wt Readings from Last 3 Encounters:  03/06/18 234 lb 3.2 oz (106.2 kg)  10/02/17 233 lb (105.7 kg)  09/07/17 233  lb (105.7 kg)   General: Alert, oriented, no distress.  Skin: normal turgor, no rashes, warm and dry HEENT: Normocephalic, atraumatic. Pupils equal round and reactive to light; sclera anicteric; extraocular muscles intact;  Nose without nasal septal  hypertrophy Mouth/Parynx benign; Mallinpatti scale 3 Neck: No JVD, no carotid bruits; normal carotid upstroke Lungs: clear to ausculatation and percussion; no wheezing or rales Chest wall: without tenderness to palpitation Heart: PMI not displaced, RRR, s1 s2 normal, 1/6 systolic murmur, no diastolic murmur, no rubs, gallops, thrills, or heaves Abdomen: soft, nontender; no hepatosplenomehaly, BS+; abdominal aorta nontender and not dilated by palpation. Back: no CVA tenderness Pulses 2+ Musculoskeletal: full range of motion, normal strength, no joint deformities Extremities: no clubbing cyanosis or edema, Homan's sign negative  Neurologic: grossly nonfocal; Cranial nerves grossly wnl Psychologic: Normal mood and affect   ECG (independently read by me): Normal sinus rhythm at 66 bpm.  Nonspecific T changes.  Normal intervals.  No ectopy.  November 2018 ECG (independently read by me): Normal sinus rhythm at 67 bpm.  Normal intervals.  Nondiagnostic T changes.  May 2018 ECG (independently read by me): normal sinus rhythm at 65 bpm.  Nonspecific T changes.  Normal intervals.  No ectopy.  January 2018 ECG (independently read by me): Normal sinus rhythm at 63 bpm.  No ectopy.  Nonspecific T changes.  Poor anterior R-wave progression.  March 19 2015 ECG (independently read by me): Normal sinus rhythm at 60 bpm.  Nonspecific T changes.   PriorECG (independently read by me): Normal sinus rhythm at 65 bpm.  Nonspecific T-wave changes V1 and V2.  Normal intervals.  LABS:  BMP Latest Ref Rng & Units 11/30/2016 07/07/2016 02/18/2016  Glucose 65 - 99 mg/dL 62(L) 74 81  BUN 6 - 20 mg/dL 9 9 11   Creatinine 0.44 - 1.00 mg/dL 0.77 0.78 1.04(H)  Sodium 135 - 145 mmol/L 140 141 140  Potassium 3.5 - 5.1 mmol/L 3.2(L) 3.5 3.1(L)  Chloride 101 - 111 mmol/L 106 103 105  CO2 22 - 32 mmol/L 26 30 27   Calcium 8.9 - 10.3 mg/dL 9.1 9.0 9.3   Hepatic Function Latest Ref Rng & Units 10/02/2017 07/07/2016 06/25/2015   Total Protein 6.0 - 8.5 g/dL 7.1 6.7 6.9  Albumin 3.6 - 4.8 g/dL 4.3 3.9 4.0  AST 0 - 40 IU/L 25 22 21   ALT 0 - 32 IU/L 23 18 18   Alk Phosphatase 39 - 117 IU/L 162(H) 144(H) 158(H)  Total Bilirubin 0.0 - 1.2 mg/dL 0.4 0.5 0.4  Bilirubin, Direct 0.00 - 0.40 mg/dL 0.15 - 0.1    CBC Latest Ref Rng & Units 11/30/2016 07/07/2016 02/18/2016  WBC 4.0 - 10.5 K/uL 7.5 6.1 7.0  Hemoglobin 12.0 - 15.0 g/dL 14.0 14.2 15.0  Hematocrit 36.0 - 46.0 % 41.4 41.6 43.5  Platelets 150 - 400 K/uL 245 266 262    BNP    Component Value Date/Time   BNP 148.4 (H) 11/30/2016 1750    ProBNP    Component Value Date/Time   PROBNP 41.4 10/15/2013 0802   Lipid Panel     Component Value Date/Time   CHOL 170 10/02/2017 1145   TRIG 86 10/02/2017 1145   HDL 56 10/02/2017 1145   CHOLHDL 3.0 10/02/2017 1145   CHOLHDL 2.8 07/07/2016 1501   VLDL 16 07/07/2016 1501   LDLCALC 97 10/02/2017 1145     RADIOLOGY: No results found.   IMPRESSION:  1. Coronary artery disease due to lipid rich plaque  2. Hyperlipidemia with target LDL less than 70   3. Obstructive sleep apnea   4. Essential hypertension   5. Medication management   6. Class 2 severe obesity with serious comorbidity and body mass index (BMI) of 35.0 to 35.9 in adult, unspecified obesity type [facility])      ASSESSMENT AND PLAN: Amber Wong is a 62 year-old female who was found to have severe coronary obstructive disease at diagnostic catheterization in May 2015 and underwent CABG revascularization surgery 3 to her left coronary system.  Her RCA was not bypassed.  She developed left-sided and chest discomfort and  was concerned that this may be similar to her prior discomfor in 2016. A Lexiscan Myoview study revealed normal perfusion.  She underwent an additional nuclear study for recurrent chest pain in August 2017 which again remained normal.  Due to recurrent chest pain symptomatology a repeat nuclear study done in April 2019 again showed  normal perfusion without scar or ischemia.  She has a history of hypertension.  Her blood pressure today was elevated initially at 155/96 but improved.  She has noticed blood pressure lability at home despite being on lisinopril 20 mg, metoprolol 50 mg in the morning and 25 mg at night.  I have recommended further titration of lisinopril to 30 mg.  With reference to her hyperlipidemia, she has been on atorvastatin 20 mg.  Most recent LDL is increased at 97.  Target LDL is less than 70 in this patient status post CABG revascularization surgery.  I will titrate atorvastatin to 40 mg.  She has been on CPAP for obstructive sleep apnea.  We discussed the importance of 100% compliance and sleeping the entire night with CPAP therapy.  Since she has reinstituted therapy her sleep has significantly improved.  She has GERD but this is controlled with pantoprazole.  She has been on Paxil for anxiety/pression.  She will undergo repeat laboratory in 1 month on her increased lisinopril and atorvastatin.  I will see her in 6 months for reevaluation.  Time spent: 25 minutes  Troy Sine, MD, [facility]  03/12/2018 7:28 PM

## 2018-03-06 NOTE — Patient Instructions (Signed)
Medication Instructions:  INCREASE lisinopril to 30 mg daily (1.5 tablet) INCREASE atorvastatin (Lipitor) to 40 mg daily  Labwork: Please return for FASTING labs in 1 month (CMET, CBC, Lipid, TSH)  Our in office lab hours are Monday-Friday 8:00-4:00, closed for lunch 12:45-1:45 pm.  No appointment needed.  Follow-Up: Your physician wants you to follow-up in: 6 months with Dr. Claiborne Billings.  You will receive a reminder letter in the mail two months in advance. If you don't receive a letter, please call our office to schedule the follow-up appointment.   Any Other Special Instructions Will Be Listed Below (If Applicable).     If you need a refill on your cardiac medications before your next appointment, please call your pharmacy.

## 2018-03-12 ENCOUNTER — Encounter: Payer: Self-pay | Admitting: Cardiovascular Disease

## 2018-03-16 ENCOUNTER — Other Ambulatory Visit: Payer: Self-pay | Admitting: Physician Assistant

## 2018-04-03 LAB — COMPREHENSIVE METABOLIC PANEL
ALT: 24 IU/L (ref 0–32)
AST: 21 IU/L (ref 0–40)
Albumin/Globulin Ratio: 1.5 (ref 1.2–2.2)
Albumin: 4 g/dL (ref 3.6–4.8)
Alkaline Phosphatase: 193 IU/L — ABNORMAL HIGH (ref 39–117)
BILIRUBIN TOTAL: 0.4 mg/dL (ref 0.0–1.2)
BUN/Creatinine Ratio: 10 — ABNORMAL LOW (ref 12–28)
BUN: 9 mg/dL (ref 8–27)
CALCIUM: 9 mg/dL (ref 8.7–10.3)
CHLORIDE: 102 mmol/L (ref 96–106)
CO2: 26 mmol/L (ref 20–29)
CREATININE: 0.9 mg/dL (ref 0.57–1.00)
GFR calc non Af Amer: 69 mL/min/{1.73_m2} (ref >59)
GFR, EST AFRICAN AMERICAN: 79 mL/min/{1.73_m2} (ref >59)
GLUCOSE: 72 mg/dL (ref 65–99)
Globulin, Total: 2.6 g/dL (ref 1.5–4.5)
Potassium: 3.5 mmol/L (ref 3.5–5.2)
Sodium: 143 mmol/L (ref 134–144)
Total Protein: 6.6 g/dL (ref 6.0–8.5)

## 2018-04-03 LAB — CBC
HEMATOCRIT: 40.3 % (ref 34.0–46.6)
HEMOGLOBIN: 13.8 g/dL (ref 11.1–15.9)
MCH: 29.6 pg (ref 26.6–33.0)
MCHC: 34.2 g/dL (ref 31.5–35.7)
MCV: 86 fL (ref 79–97)
Platelets: 281 10*3/uL (ref 150–450)
RBC: 4.67 x10E6/uL (ref 3.77–5.28)
RDW: 14.4 % (ref 12.3–15.4)
WBC: 6.5 10*3/uL (ref 3.4–10.8)

## 2018-04-03 LAB — LIPID PANEL
CHOL/HDL RATIO: 2.8 ratio (ref 0.0–4.4)
Cholesterol, Total: 136 mg/dL (ref 100–199)
HDL: 49 mg/dL (ref >39)
LDL CALC: 73 mg/dL (ref 0–99)
TRIGLYCERIDES: 72 mg/dL (ref 0–149)
VLDL Cholesterol Cal: 14 mg/dL (ref 5–40)

## 2018-04-03 LAB — TSH: TSH: 1.25 u[IU]/mL (ref 0.450–4.500)

## 2018-04-14 ENCOUNTER — Other Ambulatory Visit: Payer: Self-pay | Admitting: Physician Assistant

## 2018-04-16 NOTE — Telephone Encounter (Signed)
Rx request sent to pharmacy.  

## 2018-07-13 ENCOUNTER — Other Ambulatory Visit: Payer: Self-pay | Admitting: Physician Assistant

## 2018-07-15 NOTE — Telephone Encounter (Signed)
Rx(s) sent to pharmacy electronically.  

## 2018-08-01 ENCOUNTER — Other Ambulatory Visit: Payer: Self-pay

## 2018-08-01 ENCOUNTER — Encounter (HOSPITAL_COMMUNITY): Payer: Self-pay | Admitting: Emergency Medicine

## 2018-08-01 ENCOUNTER — Emergency Department (HOSPITAL_COMMUNITY)
Admission: EM | Admit: 2018-08-01 | Discharge: 2018-08-01 | Disposition: A | Discharge status: Home / Self Care | Payer: Medicaid Other | Attending: Emergency Medicine | Admitting: Emergency Medicine

## 2018-08-01 DIAGNOSIS — Z23 Encounter for immunization: Secondary | ICD-10-CM | POA: Insufficient documentation

## 2018-08-01 DIAGNOSIS — Y929 Unspecified place or not applicable: Secondary | ICD-10-CM | POA: Insufficient documentation

## 2018-08-01 DIAGNOSIS — W5501XA Bitten by cat, initial encounter: Secondary | ICD-10-CM | POA: Diagnosis not present

## 2018-08-01 DIAGNOSIS — I5030 Unspecified diastolic (congestive) heart failure: Secondary | ICD-10-CM | POA: Insufficient documentation

## 2018-08-01 DIAGNOSIS — Y939 Activity, unspecified: Secondary | ICD-10-CM | POA: Insufficient documentation

## 2018-08-01 DIAGNOSIS — S51851A Open bite of right forearm, initial encounter: Secondary | ICD-10-CM | POA: Insufficient documentation

## 2018-08-01 DIAGNOSIS — Y999 Unspecified external cause status: Secondary | ICD-10-CM | POA: Diagnosis not present

## 2018-08-01 DIAGNOSIS — I110 Hypertensive heart disease with heart failure: Secondary | ICD-10-CM | POA: Diagnosis not present

## 2018-08-01 DIAGNOSIS — J449 Chronic obstructive pulmonary disease, unspecified: Secondary | ICD-10-CM | POA: Diagnosis not present

## 2018-08-01 MED ORDER — AMOXICILLIN-POT CLAVULANATE 875-125 MG PO TABS
1.0000 | ORAL_TABLET | Freq: Once | ORAL | Status: AC
  Administered 2018-08-01: 1 via ORAL
  Filled 2018-08-01: qty 1

## 2018-08-01 MED ORDER — AMOXICILLIN-POT CLAVULANATE 875-125 MG PO TABS: 1.0000 | ORAL_TABLET | Freq: Two times a day (BID) | ORAL | 0 refills | Status: DC

## 2018-08-01 MED ORDER — OXYCODONE HCL 5 MG PO TABS
5.0000 mg | ORAL_TABLET | Freq: Once | ORAL | Status: AC
  Administered 2018-08-01: 5 mg via ORAL
  Filled 2018-08-01: qty 1

## 2018-08-01 MED ORDER — TETANUS-DIPHTH-ACELL PERTUSSIS 5-2.5-18.5 LF-MCG/0.5 IM SUSP
0.5000 mL | Freq: Once | INTRAMUSCULAR | Status: AC
  Administered 2018-08-01: 0.5 mL via INTRAMUSCULAR
  Filled 2018-08-01: qty 0.5

## 2018-08-01 NOTE — ED Provider Notes (Signed)
[facility] EMERGENCY DEPARTMENT Provider Note   CSN: [id number] Arrival date & time: 08/01/18  1745    History   Chief Complaint Chief Complaint  Patient presents with  . Animal Bite    HPI Amber Wong is a 63 y.o. female is here for evaluation of animal bite.  Patient states she grabbed her cat and the cat attacked her.  She was bit mostly in the right upper distal extremity.  She has smaller abrasions to her left upper extremity and right lower extremity.  Her cat is up-to-date on her rabies vaccines.  Patient does not know her last tetanus immunization.  She is right-hand dominant.  No history of diabetes.  She denies distal paresthesias or loss of sensation.  Associated symptoms include local swelling and tenderness to the right wrist.     HPI  Past Medical History:  Diagnosis Date  . Asthma   . Depression   . Hypercholesteremia   . Hypertension   . Morbid obesity ([place])   . Nocturia   . Obstructive sleep apnea (adult) (pediatric) 12/27/2012   AHI 17.7 baseline, titrated to 6 cm , AHi now 2.4 - compliance   . OSA (obstructive sleep apnea)    w/ hypoventilation  . Sleep apnea     Patient Active Problem List   Diagnosis Date Noted  . Hyperlipidemia with target LDL less than 70 03/19/2015  . Hyperlipidemia LDL goal <70 03/19/2015  . Left leg pain 03/25/2014  . Gout- Lt great toe 03/25/2014  . CAD (coronary artery disease) 10/20/2013  . Diastolic CHF ([place]) 73/53/2992  . COPD exacerbation ([place]) 10/17/2013  . HLD (hyperlipidemia) 10/17/2013  . Unstable angina ([place]) 10/16/2013  . Obesity 10/16/2013  . Hypokalemia 10/16/2013  . Chest pain 10/15/2013  . Nocturnal hypoxemia 03/28/2013  . Obstructive sleep apnea 12/27/2012  . VITAMIN D DEFICIENCY 02/21/2010  . ELEVATED SEDIMENTATION RATE 02/21/2010  . FATTY LIVER DISEASE 01/14/2010  . FATIGUE 12/17/2009  . PLEURAL EFFUSION 09/02/2009  . BENIGN NEOPLASM OF ADRENAL GLAND 09/01/2009  .  HEMATURIA UNSPECIFIED 09/01/2009  . Nonspecific (abnormal) findings on radiological and other examination of body structure 09/01/2009  . Personal history of other endocrine, metabolic, and immunity disorders 09/01/2009  . CT, CHEST, ABNORMAL 09/01/2009  . ACUTE BRONCHITIS 04/27/2009  . ACUTE CYSTITIS 03/24/2009  . COUGH 03/24/2009  . ULNAR NEUROPATHY, BILATERAL 01/04/2009  . HEADACHE 01/04/2009  . CHEST PAIN UNSPECIFIED 01/04/2009  . MUSCLE SPASM 11/16/2008  . DENTAL CARIES 11/06/2007  . HYPERLIPIDEMIA 09/01/2007  . DYSPNEA ON EXERTION 08/27/2007  . CARPAL TUNNEL SYNDROME 05/14/2007  . SLEEP APNEA 04/09/2007  . ANXIETY 02/11/2007  . ABUSE, ALCOHOL, IN REMISSION 02/11/2007  . PERIPHERAL NEUROPATHY 02/11/2007  . ALLERGIC RHINITIS 02/11/2007  . GERD 02/11/2007  . OSTEOPENIA 02/11/2007  . HSV 02/05/2007  . DEPRESSION 02/05/2007  . Essential hypertension 02/05/2007  . HEMATURIA 02/05/2007    Past Surgical History:  Procedure Laterality Date  . BREAST LUMPECTOMY Right    benign at age 50  . CORONARY ARTERY BYPASS GRAFT N/A 10/20/2013   Procedure: CORONARY ARTERY BYPASS GRAFTING (CABG);  Surgeon: Ivin Poot, MD;  Location: [facility];  Service: Open Heart Surgery;  Laterality: N/A;  CABG x 3  . CYSTECTOMY    . INTRAOPERATIVE TRANSESOPHAGEAL ECHOCARDIOGRAM N/A 10/20/2013   Procedure: INTRAOPERATIVE TRANSESOPHAGEAL ECHOCARDIOGRAM;  Surgeon: Ivin Poot, MD;  Location: [facility];  Service: Open Heart Surgery;  Laterality: N/A;  . KNEE SURGERY     chronic  pain and limping since  . LEFT HEART CATHETERIZATION WITH CORONARY ANGIOGRAM N/A 10/16/2013   Procedure: LEFT HEART CATHETERIZATION WITH CORONARY ANGIOGRAM;  Surgeon: Troy Sine, MD;  Location: [facility] C[facility] LAB;  Service: Cardiovascular;  Laterality: N/A;  . PARTIAL HYSTERECTOMY    . TONSILLECTOMY       OB History   No obstetric history on file.      Home Medications    Prior to Admission medications   Medication Sig Start  Date End Date Taking? Authorizing Provider  acyclovir (ZOVIRAX) 200 MG capsule Take 200 mg by mouth every morning.    [provider]  allopurinol (ZYLOPRIM) 100 MG tablet Take 100 mg by mouth 3 (three) times daily.    [provider]  amoxicillin-clavulanate (AUGMENTIN) 875-125 MG tablet Take 1 tablet by mouth every 12 (twelve) hours. 08/01/18   Kinnie Feil, PA-C  aspirin 81 MG tablet Take 81 mg by mouth daily.     [provider]  atorvastatin (LIPITOR) 40 MG tablet Take 1 tablet (40 mg total) by mouth daily. 03/06/18   Troy Sine, MD  buPROPion (WELLBUTRIN) 100 MG tablet Take 100 mg by mouth 2 (two) times daily. 10/24/15   [provider]  cholecalciferol (VITAMIN D) 1000 units tablet Take 1,000 Units by mouth daily.    [provider]  cromolyn (OPTICROM) 4 % ophthalmic solution Place 1 drop into both eyes 4 (four) times daily. 06/16/15   [provider]  fluconazole (DIFLUCAN) 150 MG tablet Take 1 tablet by mouth daily as needed. When yeast infection is present 09/17/15   [provider]  Fluticasone-Salmeterol (ADVAIR) 250-50 MCG/DOSE AEPB Inhale 1 puff into the lungs 2 (two) times daily.    [provider]  lisinopril (PRINIVIL,ZESTRIL) 20 MG tablet Take 1.5 tablets (30 mg total) by mouth daily. 03/06/18   Troy Sine, MD  meloxicam (MOBIC) 15 MG tablet Take 15 mg by mouth daily.    [provider]  metoprolol tartrate (LOPRESSOR) 25 MG tablet TAKE 2 TABLETS IN THE MORNING AND 1 TABLET AT NIGHT 07/09/17   Troy Sine, MD  nitroGLYCERIN (NITROSTAT) 0.4 MG SL tablet Place 1 tablet (0.4 mg total) under the tongue every 5 (five) minutes as needed for chest pain. 07/15/18   Troy Sine, MD  pantoprazole (PROTONIX) 40 MG tablet TAKE 1 TABLET BY MOUTH TWICE A DAY 04/16/18   Troy Sine, MD  PARoxetine (PAXIL) 20 MG tablet Take 20 mg by mouth every morning.    [provider]  triamcinolone cream  (KENALOG) 0.1 % Apply 1 application topically 2 (two) times daily.    [provider]    Family History Family History  Problem Relation Age of Onset  . Heart attack Mother 68       died of MI  . Heart attack Father 30       died of MI  . Cancer Sister        double mastectomy  . Heart attack Sister        2 sisters in their 69's with MI/stents    Social History Social History   Tobacco Use  . Smoking status: Former Smoker    Years: 15.00    Types: Cigarettes  . Smokeless tobacco: Never Used  . Tobacco comment: Quit 2005  Substance Use Topics  . Alcohol use: No  . Drug use: No     Allergies   Iohexol; Penicillins; and Shellfish allergy  Review of Systems Review of Systems  Skin: Positive for wound.  All other systems reviewed and are negative.    Physical Exam Updated Vital Signs BP 140/84 (BP Location: Right Arm)   Pulse 71   Resp 18   SpO2 98%   Physical Exam Constitutional:      Appearance: She is well-developed. She is not toxic-appearing.  HENT:     Head: Normocephalic.     Right Ear: External ear normal.     Left Ear: External ear normal.     Nose: Nose normal.  Eyes:     Conjunctiva/sclera: Conjunctivae normal.  Neck:     Musculoskeletal: Full passive range of motion without pain.  Cardiovascular:     Rate and Rhythm: Normal rate.  Pulmonary:     Effort: Pulmonary effort is normal. No tachypnea or respiratory distress.  Musculoskeletal: Normal range of motion.     Comments: Focal edema, ecchymosis surrounding puncture wounds on the right dorsal wrist/distal forearm.  Full range of motion of the right wrist against resistance.  Full range of motion of the right digits against resistance.  Skin:    General: Skin is warm and dry.     Capillary Refill: Capillary refill takes less than 2 seconds.     Comments: 4 small puncture wounds to the dorsum distal right forearm with local mild ecchymosis and tenderness.  Wounds are hemostatic.   Wounds appear superficial, no foreign bodies palpated. 1 small puncture wound to the ventral distal right forearm without significant ecchymosis, edema, tenderness, hemostatic. Multiple, smaller linear abrasions to the right and left upper extremities and right lateral lower leg.  Neurological:     Mental Status: She is alert and oriented to person, place, and time.     Comments: Sensation to light touch intact in radial, medial, ulnar nerve distribution on the right hand.  Strength is intact.  Psychiatric:        Behavior: Behavior normal.        Thought Content: Thought content normal.      ED Treatments / Results  Labs (all labs ordered are listed, but only abnormal results are displayed) Labs Reviewed - No data to display  EKG None  Radiology No results found.  Procedures Procedures (including critical care time)  Medications Ordered in ED Medications  Tdap (BOOSTRIX) injection 0.5 mL (has no administration in time range)     Initial Impression / Assessment and Plan / ED Course  I have reviewed the triage vital signs and the nursing notes.  Pertinent labs & imaging results that were available during my care of the patient were reviewed by me and considered in my medical decision making (see chart for details).        1 year old with cat bite to the right distal forearm onset earlier today.  Extremities neurovascularly intact.  Puncture wounds are mostly superficial, hemostatic with local inflammation.  She has full range of motion of this extremity without any deficits.  I have low suspicion for trapped radiopaque foreign body/tooth in the wounds given her exam.  I do not think x-ray is indicated today.  Patient is comfortable with this.  Involved animal is up-to-date on rabies immunization.  She will be given tetanus today.  Wound cleaned in the ER, dressing applied.  She will be discharged with NSAIDs, Augmentin, wound care instructions.  Return precautions were  discussed.  Patient is comfortable with this.  Final Clinical Impressions(s) / ED Diagnoses   Final diagnoses:  Cat  bite, initial encounter    ED Discharge Orders         Ordered    amoxicillin-clavulanate (AUGMENTIN) 875-125 MG tablet  Every 12 hours     08/01/18 1829           Arlean Hopping 08/01/18 Moody Bruins, MD 08/02/18 0700

## 2018-08-01 NOTE — ED Notes (Signed)
Pt verbalized understanding of discharge instructions and denies any further questions at this time.  Pt states she has a ride.

## 2018-08-01 NOTE — ED Triage Notes (Signed)
Pt attacked by her cat. Rabies up to date. Unknown last tetanus. Bleeding controlled.

## 2018-08-01 NOTE — Discharge Instructions (Signed)
You were seen in the ER after cat bite.   Take antibiotic as prescribed.   Tetanus was updated today  Monitor for signs of infection including increased redness, pain, swelling, pus, fevers, loss of sensation or strength to the extremity

## 2018-08-19 ENCOUNTER — Other Ambulatory Visit: Payer: Self-pay | Admitting: Cardiovascular Disease

## 2018-08-20 NOTE — Telephone Encounter (Signed)
Rx(s) sent to pharmacy electronically.  

## 2018-09-05 ENCOUNTER — Telehealth: Payer: Self-pay

## 2018-09-05 NOTE — Telephone Encounter (Signed)
   Cardiac Questionnaire:    Since your last visit or hospitalization:    1. Have you been having new or worsening chest pain? No   2. Have you been having new or worsening shortness of breath? No 3. Have you been having new or worsening leg swelling, wt gain, or increase in abdominal girth (pants fitting more tightly)? No   4. Have you had any passing out spells? No         Primary Cardiologist:  Dr.Kelly  Patient contacted.  History reviewed.  No symptoms to suggest any unstable cardiac conditions.  Based on discussion, with current pandemic situation, we will be postponing this appointment for [facility] with a plan for f/u in 6-12 wks or sooner if feasible/necessary.  If symptoms change, she has been instructed to contact our office.   Routing to C19 CANCEL pool for tracking (P CV DIV CV19 CANCEL - reason for visit "other.") and assigning priority (1 = 4-6 wks, 2 = 6-12 wks, 3 = >12 wks).   Priority 2  Ena Dawley, LPN  8/63/8177 1:16 PM         .

## 2018-09-10 ENCOUNTER — Ambulatory Visit: Payer: Medicaid Other | Admitting: Cardiovascular Disease

## 2018-10-22 ENCOUNTER — Other Ambulatory Visit: Payer: Self-pay

## 2018-10-22 ENCOUNTER — Emergency Department (HOSPITAL_COMMUNITY)
Admission: EM | Admit: 2018-10-22 | Discharge: 2018-10-22 | Disposition: A | Discharge status: Home / Self Care | Payer: Medicaid Other | Attending: Emergency Medicine | Admitting: Emergency Medicine

## 2018-10-22 ENCOUNTER — Encounter (HOSPITAL_COMMUNITY): Payer: Self-pay | Admitting: Emergency Medicine

## 2018-10-22 DIAGNOSIS — Z7982 Long term (current) use of aspirin: Secondary | ICD-10-CM | POA: Insufficient documentation

## 2018-10-22 DIAGNOSIS — I2510 Atherosclerotic heart disease of native coronary artery without angina pectoris: Secondary | ICD-10-CM | POA: Insufficient documentation

## 2018-10-22 DIAGNOSIS — Z87891 Personal history of nicotine dependence: Secondary | ICD-10-CM | POA: Diagnosis not present

## 2018-10-22 DIAGNOSIS — Z951 Presence of aortocoronary bypass graft: Secondary | ICD-10-CM | POA: Insufficient documentation

## 2018-10-22 DIAGNOSIS — J45909 Unspecified asthma, uncomplicated: Secondary | ICD-10-CM | POA: Diagnosis not present

## 2018-10-22 DIAGNOSIS — E876 Hypokalemia: Secondary | ICD-10-CM | POA: Diagnosis not present

## 2018-10-22 DIAGNOSIS — R42 Dizziness and giddiness: Secondary | ICD-10-CM | POA: Diagnosis not present

## 2018-10-22 DIAGNOSIS — Z79899 Other long term (current) drug therapy: Secondary | ICD-10-CM | POA: Diagnosis not present

## 2018-10-22 DIAGNOSIS — H81399 Other peripheral vertigo, unspecified ear: Secondary | ICD-10-CM

## 2018-10-22 DIAGNOSIS — I10 Essential (primary) hypertension: Secondary | ICD-10-CM | POA: Insufficient documentation

## 2018-10-22 LAB — BASIC METABOLIC PANEL
Anion gap: 11 (ref 5–15)
BUN: 20 mg/dL (ref 8–23)
CO2: 25 mmol/L (ref 22–32)
Calcium: 9.4 mg/dL (ref 8.9–10.3)
Chloride: 103 mmol/L (ref 98–111)
Creatinine, Ser: 0.83 mg/dL (ref 0.44–1.00)
GFR calc Af Amer: >60 mL/min (ref >60)
GFR calc non Af Amer: >60 mL/min (ref >60)
Glucose, Bld: 145 mg/dL — ABNORMAL HIGH (ref 70–99)
Potassium: 2.9 mmol/L — ABNORMAL LOW (ref 3.5–5.1)
Sodium: 139 mmol/L (ref 135–145)

## 2018-10-22 LAB — CBC
HCT: 41.1 % (ref 36.0–46.0)
Hemoglobin: 13.5 g/dL (ref 12.0–15.0)
MCH: 30.2 pg (ref 26.0–34.0)
MCHC: 32.8 g/dL (ref 30.0–36.0)
MCV: 91.9 fL (ref 80.0–100.0)
Platelets: 214 10*3/uL (ref 150–400)
RBC: 4.47 MIL/uL (ref 3.87–5.11)
RDW: 14.4 % (ref 11.5–15.5)
WBC: 8.7 10*3/uL (ref 4.0–10.5)
nRBC: 0 % (ref 0.0–0.2)

## 2018-10-22 LAB — TROPONIN I: Troponin I: <0.03 ng/mL (ref <0.03)

## 2018-10-22 MED ORDER — SODIUM CHLORIDE 0.9 % IV BOLUS (SEPSIS)
1000.0000 mL | Freq: Once | INTRAVENOUS | Status: AC
  Administered 2018-10-22: 02:00:00 1000 mL via INTRAVENOUS

## 2018-10-22 MED ORDER — DIAZEPAM 5 MG PO TABS: 5.0000 mg | ORAL_TABLET | Freq: Three times a day (TID) | ORAL | 0 refills | Status: DC | PRN

## 2018-10-22 MED ORDER — POTASSIUM CHLORIDE CRYS ER 20 MEQ PO TBCR: 20.0000 meq | EXTENDED_RELEASE_TABLET | Freq: Two times a day (BID) | ORAL | 0 refills | Status: DC

## 2018-10-22 MED ORDER — ONDANSETRON 4 MG PO TBDP: 4.0000 mg | ORAL_TABLET | Freq: Four times a day (QID) | ORAL | 0 refills | Status: DC | PRN

## 2018-10-22 MED ORDER — DIAZEPAM 5 MG PO TABS
5.0000 mg | ORAL_TABLET | Freq: Once | ORAL | Status: AC
  Administered 2018-10-22: 03:00:00 5 mg via ORAL
  Filled 2018-10-22: qty 1

## 2018-10-22 MED ORDER — POTASSIUM CHLORIDE CRYS ER 20 MEQ PO TBCR
40.0000 meq | EXTENDED_RELEASE_TABLET | Freq: Once | ORAL | Status: AC
  Administered 2018-10-22: 03:00:00 40 meq via ORAL
  Filled 2018-10-22: qty 2

## 2018-10-22 MED ORDER — MECLIZINE HCL 25 MG PO TABS
50.0000 mg | ORAL_TABLET | Freq: Once | ORAL | Status: AC
  Administered 2018-10-22: 02:00:00 50 mg via ORAL
  Filled 2018-10-22: qty 2

## 2018-10-22 MED ORDER — ONDANSETRON HCL 4 MG/2ML IJ SOLN
4.0000 mg | Freq: Once | INTRAMUSCULAR | Status: AC
  Administered 2018-10-22: 02:00:00 4 mg via INTRAVENOUS
  Filled 2018-10-22: qty 2

## 2018-10-22 NOTE — ED Triage Notes (Signed)
Pt transported to [facility] ED from home via [facility]. EMS reports the following:  Feeling dizzy all day 10/21/18. No headache, vision changes, SOB. Negative on stroke scale.   Called 911 at 6:00 10/21/18 but decided not to go. Continued to feel dizzy. After calling 911 the second time, felt nauseous and vomited one time. No abdominal or chest pain. Reports left arm pain that radiates to shoulders, back, legs.  EKG unremarkable 20g Left wrist

## 2018-10-22 NOTE — ED Notes (Addendum)
Pt ambulated to the restroom. Said the restroom started spinning. The spinning is not as bad as it was earlier today. Ambulated back to bed. While in bed, she reports feeling dizzy and funny. Pt drank 8 oz ginger ale and doesn't feel nausea.

## 2018-10-22 NOTE — ED Provider Notes (Signed)
TIME SEEN: 1:51 AM  CHIEF COMPLAINT: Vertigo  HPI: Patient is a 63 year old female with history of CAD status post CABG, hypertension, hyperlipidemia, asthma who presents to the emergency department with complaints of dizziness.  States she feels like the room is spinning and this started when she woke up yesterday morning around 5 AM.  States she opened her eyes and everything was spinning.  States that symptoms are intermittently present mostly with standing up and trying to walk.  She is asymptomatic with staying still and sitting down.  No numbness, weakness, vision changes.  She did have nausea and vomiting with this vertigo.  Has had some left shoulder pain that has been ongoing for several days and denies any injury but pain is worse with palpation and movement.  States it radiates down the arm.  Has had some intermittent sharp left-sided chest pains only last for a few seconds and then resolved.  No shortness of breath.  Has chronic cough from asthma.  No fever.  No numbness, tingling or focal weakness.  Has never had vertigo before.  No ear pain, hearing loss but states she has had intermittent tinnitus bilaterally for the past months.  ROS: See HPI Constitutional: no fever  Eyes: no drainage  ENT: no runny nose   Cardiovascular:  no chest pain  Resp: no SOB  GI:  vomiting GU: no dysuria Integumentary: no rash  Allergy: no hives  Musculoskeletal: no leg swelling  Neurological: no slurred speech ROS otherwise negative  PAST MEDICAL HISTORY/PAST SURGICAL HISTORY:  Past Medical History:  Diagnosis Date  . Asthma   . Depression   . Hypercholesteremia   . Hypertension   . Morbid obesity ([place])   . Nocturia   . Obstructive sleep apnea (adult) (pediatric) 12/27/2012   AHI 17.7 baseline, titrated to 6 cm , AHi now 2.4 - compliance   . OSA (obstructive sleep apnea)    w/ hypoventilation  . Sleep apnea     MEDICATIONS:  Prior to Admission medications   Medication Sig Start Date End  Date Taking? Authorizing Provider  acyclovir (ZOVIRAX) 200 MG capsule Take 200 mg by mouth every morning.    [provider]  allopurinol (ZYLOPRIM) 100 MG tablet Take 100 mg by mouth 3 (three) times daily.    [provider]  amoxicillin-clavulanate (AUGMENTIN) 875-125 MG tablet Take 1 tablet by mouth every 12 (twelve) hours. 08/01/18   Kinnie Feil, PA-C  aspirin 81 MG tablet Take 81 mg by mouth daily.     [provider]  atorvastatin (LIPITOR) 40 MG tablet Take 1 tablet (40 mg total) by mouth daily. 03/06/18   Troy Sine, MD  buPROPion (WELLBUTRIN) 100 MG tablet Take 100 mg by mouth 2 (two) times daily. 10/24/15   [provider]  cholecalciferol (VITAMIN D) 1000 units tablet Take 1,000 Units by mouth daily.    [provider]  cromolyn (OPTICROM) 4 % ophthalmic solution Place 1 drop into both eyes 4 (four) times daily. 06/16/15   [provider]  fluconazole (DIFLUCAN) 150 MG tablet Take 1 tablet by mouth daily as needed. When yeast infection is present 09/17/15   [provider]  Fluticasone-Salmeterol (ADVAIR) 250-50 MCG/DOSE AEPB Inhale 1 puff into the lungs 2 (two) times daily.    [provider]  lisinopril (PRINIVIL,ZESTRIL) 20 MG tablet Take 1.5 tablets (30 mg total) by mouth daily. 03/06/18   Troy Sine, MD  meloxicam (MOBIC) 15 MG tablet Take 15 mg  by mouth daily.    [provider]  metoprolol tartrate (LOPRESSOR) 25 MG tablet TAKE 2 TABLETS IN THE MORNING AND 1 TABLET AT NIGHT 07/09/17   Troy Sine, MD  nitroGLYCERIN (NITROSTAT) 0.4 MG SL tablet Place 1 tablet (0.4 mg total) under the tongue every 5 (five) minutes as needed for chest pain. 07/15/18   Troy Sine, MD  pantoprazole (PROTONIX) 40 MG tablet Take 1 tablet (40 mg total) by mouth 2 (two) times daily. 08/20/18   Troy Sine, MD  PARoxetine (PAXIL) 20 MG tablet Take 20 mg by mouth every morning.    [provider]   triamcinolone cream (KENALOG) 0.1 % Apply 1 application topically 2 (two) times daily.    [provider]    ALLERGIES:  Allergies  Allergen Reactions  . Iohexol Other (See Comments)     Code: HIVES, Desc: premedicated 2 hrs prior with 200mg  soulumedrol IV, no reaction s/p injection, Onset Date: [id number]   . Penicillins Other (See Comments)    REACTION: pruritis  . Shellfish Allergy Other (See Comments)    Cannot have because of gout    SOCIAL HISTORY:  Social History   Tobacco Use  . Smoking status: Former Smoker    Years: 15.00    Types: Cigarettes  . Smokeless tobacco: Never Used  . Tobacco comment: Quit 2005  Substance Use Topics  . Alcohol use: No    FAMILY HISTORY: Family History  Problem Relation Age of Onset  . Heart attack Mother 68       died of MI  . Heart attack Father 62       died of MI  . Cancer Sister        double mastectomy  . Heart attack Sister        2 sisters in their 81's with MI/stents    EXAM: BP (!) 151/102 (BP Location: Right Arm)   Pulse 69   Temp 98 F (36.7 C) (Oral)   Resp 18   Ht 5\' 8"  (1.727 m)   Wt 106.6 kg   SpO2 99%   BMI 35.73 kg/m  CONSTITUTIONAL: Alert and oriented and responds appropriately to questions. Well-appearing; well-nourished; obese HEAD: Normocephalic EYES: Conjunctivae clear, pupils appear equal, EOMI ENT: normal nose; moist mucous membranes; No pharyngeal erythema or petechiae, no tonsillar hypertrophy or exudate, no uvular deviation, no unilateral swelling, no trismus or drooling, no muffled voice, normal phonation, no stridor, no dental caries present, no drainable dental abscess noted, no Ludwig's angina, tongue sits flat in the bottom of the mouth, no angioedema, no facial erythema or warmth, no facial swelling; no pain with movement of the neck.  TMs are clear bilaterally without erythema, purulence, bulging, perforation, effusion.  No cerumen impaction or sign of foreign body in the external  auditory canal. No inflammation, erythema or drainage from the external auditory canal. No signs of mastoiditis. No pain with manipulation of the pinna bilaterally. NECK: Supple, no meningismus, no nuchal rigidity, no LAD  CARD: RRR; S1 and S2 appreciated; no murmurs, no clicks, no rubs, no gallops RESP: Normal chest excursion without splinting or tachypnea; breath sounds clear and equal bilaterally; no wheezes, no rhonchi, no rales, no hypoxia or respiratory distress, speaking full sentences ABD/GI: Normal bowel sounds; non-distended; soft, non-tender, no rebound, no guarding, no peritoneal signs, no hepatosplenomegaly BACK:  The back appears normal and is non-tender to palpation, there is no CVA tenderness EXT: Normal ROM in all joints; tender to  palpation over the superior and posterior left shoulder without swelling, ecchymosis, redness or warmth or bony deformity; no edema; normal capillary refill; no cyanosis, no calf tenderness or swelling, 2+ radial pulses bilaterally    SKIN: Normal color for age and race; warm; no rash NEURO: Moves all extremities equally, strength 5/5 in all 4 extremities, sensation to light touch intact diffusely, cranial nerves II through XII intact, normal speech, no dysmetria to finger-to-nose testing bilaterally, no nystagmus PSYCH: The patient's mood and manner are appropriate. Grooming and personal hygiene are appropriate.  MEDICAL DECISION MAKING: Patient here with what seems to be peripheral vertigo.  She does have risk factors for stroke but have low suspicion for central cause.  She is asymptomatic unless she begins to move.  She has no dysmetria on exam.  Patient would be outside TPA window given symptoms started when she woke up yesterday morning.  Will treat symptoms with meclizine, Zofran, IV fluids.  She does complain of some atypical intermittent sharp left-sided chest pain as well as left shoulder pain.  Shoulder pain seems to be musculoskeletal in nature.   EKG shows no ischemic changes.  I have low suspicion for ACS, PE or dissection.  ED PROGRESS: Patient's labs unremarkable other than potassium of 2.9 without interval changes on EKG.  Will give oral replacement.  Patient hypertensive but she is not having headache, vision changes, current chest pain, shortness of breath, numbness, tingling, weakness.  I do not admit the patient is having a stroke or intracranial hemorrhage.  Will attempt to ambulate patient and a fluid challenge here in the ED.  3:00 AM  Patient ambulated to the bathroom without ataxia but still felt dizzy.  Will give dose of oral Valium.  4:10 AM  Pt states she is feeling better and dizziness has resolved.  She states she feels the Valium helped her more than the meclizine.  Will discharge with prescriptions of Valium, Zofran and outpatient ENT follow-up.  Discussed return precautions.  Advised her not to drive until vertigo completely resolved.  Patient comfortable with this plan.   At this time, I do not feel there is any life-threatening condition present. I have reviewed and discussed all results (EKG, imaging, lab, urine as appropriate) and exam findings with patient/family. I have reviewed nursing notes and appropriate previous records.  I feel the patient is safe to be discharged home without further emergent workup and can continue workup as an outpatient as needed. Discussed usual and customary return precautions. Patient/family verbalize understanding and are comfortable with this plan.  Outpatient follow-up has been provided as needed. All questions have been answered.    EKG Interpretation  Date/Time:  Tuesday Oct 22 2018 01:40:24 EDT Ventricular Rate:  69 PR Interval:    QRS Duration: 88 QT Interval:  416 QTC Calculation: 446 R Axis:   43 Text Interpretation:  Sinus rhythm Low voltage, precordial leads Nonspecific T abnormalities, lateral leads No significant change since last tracing Confirmed by Grayling Schranz, Cyril Mourning  [phone]) on 10/22/2018 1:55:18 AM         Gwenette Wellons, Delice Bison, DO 10/22/18 3016

## 2018-10-22 NOTE — ED Notes (Signed)
Bed: WA17 Expected date:  Expected time:  Means of arrival:  Comments: 63 yo F/dizziness

## 2018-10-25 ENCOUNTER — Telehealth: Payer: Self-pay

## 2018-10-25 NOTE — Telephone Encounter (Signed)
   [facility] Pre-operative Risk Assessment    Request for surgical clearance:  1. What type of surgery is being performed? Right Wrist mass Excision    2. When is this surgery scheduled? TBD   3. What type of clearance is required (medical clearance vs. Pharmacy clearance to hold med vs. Both)? Medical  4. Are there any medications that need to be held prior to surgery and how long? Not specified    5. Practice name and name of physician performing surgery?  [facility]- Dr. Charlotte Crumb   6. What is your office phone number [phone]    7.   What is your office fax number [phone]  8.   Anesthesia type (None, local, MAC, general) ? General   Meryl Crutch 10/25/2018, 1:16 PM  _________________________________________________________________   (provider comments below)

## 2018-10-28 NOTE — Telephone Encounter (Addendum)
   Primary Cardiologist:Thomas Claiborne Billings, MD  Chart reviewed as part of pre-operative protocol coverage. Because of Amber Wong past medical history and time since last visit, he/she will require a follow-up visit in order to better assess preoperative cardiovascular risk.  Please schedule a virtual visit with Dr. Claiborne Billings or an APP on his team.  Pre-op covering staff: - Please schedule appointment and call patient to inform them. - Please contact requesting surgeon's office via preferred method (i.e, phone, fax) to inform them of need for appointment prior to surgery.  If applicable, this message will also be routed to pharmacy pool and/or primary cardiologist for input on holding anticoagulant/antiplatelet agent as requested below so that this information is available at time of patient's appointment.   Pierce, PA  10/29/2018, 9:25 AM

## 2018-10-29 ENCOUNTER — Telehealth: Payer: Self-pay | Admitting: *Deleted

## 2018-10-29 NOTE — Telephone Encounter (Signed)
S/w pt to make appt for pre-op clearance. Pt will see Isaac Laud next week.  Pt stated paramedics came to pt's house due to BP being 200/160 and pt having vertigo. Pt stated is f/u with PCP on Friday.  Pt also stated CPAP has been broken and contacted [facility] twice and never heard back from sleep tech. Will resend today.  Pt also reported that something was said to Dr. Claiborne Billings from [facility] about pt's mental status and pt did not appreciate this and wanted someone to know.  Pt stated Dr. Claiborne Billings can read for himself.  Will give to management on what to do with this phone call. Pt consented to telehealth visit will put consent in chart.  Also, went over instructions what pt will need the day of telehealth visit.     Virtual Visit Pre-Appointment Phone Call  "(Name), I am calling you today to discuss your upcoming appointment. We are currently trying to limit exposure to the virus that causes COVID-19 by seeing patients at home rather than in the office."  1. "What is the BEST phone number to call the day of the visit?" - include this in appointment notes  2. "Do you have or have access to (through a family member/friend) a smartphone with video capability that we can use for your visit?" a. If yes - list this number in appt notes as "cell" (if different from BEST phone #) and list the appointment type as a VIDEO visit in appointment notes b. If no - list the appointment type as a PHONE visit in appointment notes  3. Confirm consent - "In the setting of the current Covid19 crisis, you are scheduled for a (phone or video) visit with your provider on (May 27) at (1:30 pm ).  Just as we do with many in-office visits, in order for you to participate in this visit, we must obtain consent.  If you'd like, I can send this to your mychart (if signed up) or email for you to review.  Otherwise, I can obtain your verbal consent now.  All virtual visits are billed to your insurance company just like a normal visit would be.  By  agreeing to a virtual visit, we'd like you to understand that the technology does not allow for your provider to perform an examination, and thus may limit your provider's ability to fully assess your condition. If your provider identifies any concerns that need to be evaluated in person, we will make arrangements to do so.  Finally, though the technology is pretty good, we cannot assure that it will always work on either your or our end, and in the setting of a video visit, we may have to convert it to a phone-only visit.  In either situation, we cannot ensure that we have a secure connection.  Are you willing to proceed?" STAFF: Did the patient verbally acknowledge consent to telehealth visit? Document YES/NO here: YES  4. Advise patient to be prepared - "Two hours prior to your appointment, go ahead and check your blood pressure, pulse, oxygen saturation, and your weight (if you have the equipment to check those) and write them all down. When your visit starts, your provider will ask you for this information. If you have an Apple Watch or Kardia device, please plan to have heart rate information ready on the day of your appointment. Please have a pen and paper handy nearby the day of the visit as well."  5. Give patient instructions for MyChart download to  smartphone OR Doximity/Doxy.me as below if video visit (depending on what platform provider is using)  6. Inform patient they will receive a phone call 15 minutes prior to their appointment time (may be from unknown caller ID) so they should be prepared to answer    TELEPHONE CALL NOTE  Denyse Fillion has been deemed a candidate for a follow-up tele-health visit to limit community exposure during the Covid-19 pandemic. I spoke with the patient via phone to ensure availability of phone/video source, confirm preferred email & phone number, and discuss instructions and expectations.  I reminded Jerusalem Brownstein to be prepared with any  vital sign and/or heart rhythm information that could potentially be obtained via home monitoring, at the time of her visit. I reminded Sun Kihn to expect a phone call prior to her visit.   Avanell Shackleton 10/29/2018 11:46 AM   INSTRUCTIONS FOR DOWNLOADING THE MYCHART APP TO SMARTPHONE  - The patient must first make sure to have activated MyChart and know their login information - If Apple, go to [employer] and type in MyChart in the search bar and download the app. If Android, ask patient to go to [employer] and type in [place] in the search bar and download the app. The app is free but as with any other app downloads, their phone may require them to verify saved payment information or Apple/Android password.  - The patient will need to then log into the app with their MyChart username and password, and select [facility] as their healthcare provider to link the account. When it is time for your visit, go to the MyChart app, find appointments, and click Begin Video Visit. Be sure to Select Allow for your device to access the Microphone and Camera for your visit. You will then be connected, and your provider will be with you shortly.  **If they have any issues connecting, or need assistance please contact MyChart service desk [phone])**  **If using a computer, in order to ensure the best quality for their visit they will need to use either of the following Internet Browsers: [employer], or Google Chrome**  IF USING DOXIMITY or DOXY.ME - The patient will receive a link just prior to their visit by text.     FULL LENGTH CONSENT FOR TELE-HEALTH VISIT   I hereby voluntarily request, consent and authorize [facility] and its employed or contracted physicians, physician assistants, nurse practitioners or other licensed health care professionals (the Practitioner), to provide me with telemedicine health care services (the "Services") as deemed  necessary by the treating Practitioner. I acknowledge and consent to receive the Services by the Practitioner via telemedicine. I understand that the telemedicine visit will involve communicating with the Practitioner through live audiovisual communication technology and the disclosure of certain medical information by electronic transmission. I acknowledge that I have been given the opportunity to request an in-person assessment or other available alternative prior to the telemedicine visit and am voluntarily participating in the telemedicine visit.  I understand that I have the right to withhold or withdraw my consent to the use of telemedicine in the course of my care at any time, without affecting my right to future care or treatment, and that the Practitioner or I may terminate the telemedicine visit at any time. I understand that I have the right to inspect all information obtained and/or recorded in the course of the telemedicine visit and may receive copies of available information for a reasonable fee.  I understand  that some of the potential risks of receiving the Services via telemedicine include:  Marland Kitchen Delay or interruption in medical evaluation due to technological equipment failure or disruption; . Information transmitted may not be sufficient (e.g. poor resolution of images) to allow for appropriate medical decision making by the Practitioner; and/or  . In rare instances, security protocols could fail, causing a breach of personal health information.  Furthermore, I acknowledge that it is my responsibility to provide information about my medical history, conditions and care that is complete and accurate to the best of my ability. I acknowledge that Practitioner's advice, recommendations, and/or decision may be based on factors not within their control, such as incomplete or inaccurate data provided by me or distortions of diagnostic images or specimens that may result from electronic  transmissions. I understand that the practice of medicine is not an exact science and that Practitioner makes no warranties or guarantees regarding treatment outcomes. I acknowledge that I will receive a copy of this consent concurrently upon execution via email to the email address I last provided but may also request a printed copy by calling the office of [facility].    I understand that my insurance will be billed for this visit.   I have read or had this consent read to me. . I understand the contents of this consent, which adequately explains the benefits and risks of the Services being provided via telemedicine.  . I have been provided ample opportunity to ask questions regarding this consent and the Services and have had my questions answered to my satisfaction. . I give my informed consent for the services to be provided through the use of telemedicine in my medical care  By participating in this telemedicine visit I agree to the above.

## 2018-10-29 NOTE — Telephone Encounter (Signed)
Pt is scheduled for a virtual visit with Almyra Deforest, PA on 5/27

## 2018-11-05 ENCOUNTER — Telehealth: Payer: Self-pay | Admitting: Physician Assistant

## 2018-11-06 ENCOUNTER — Telehealth (INDEPENDENT_AMBULATORY_CARE_PROVIDER_SITE_OTHER): Payer: Medicaid Other | Admitting: Physician Assistant

## 2018-11-06 VITALS — Ht 68.0 in | Wt 236.0 lb

## 2018-11-06 DIAGNOSIS — Z01810 Encounter for preprocedural cardiovascular examination: Secondary | ICD-10-CM | POA: Diagnosis not present

## 2018-11-06 DIAGNOSIS — E876 Hypokalemia: Secondary | ICD-10-CM

## 2018-11-06 DIAGNOSIS — E785 Hyperlipidemia, unspecified: Secondary | ICD-10-CM

## 2018-11-06 DIAGNOSIS — I10 Essential (primary) hypertension: Secondary | ICD-10-CM

## 2018-11-06 DIAGNOSIS — I25810 Atherosclerosis of coronary artery bypass graft(s) without angina pectoris: Secondary | ICD-10-CM

## 2018-11-06 DIAGNOSIS — G4733 Obstructive sleep apnea (adult) (pediatric): Secondary | ICD-10-CM

## 2018-11-06 MED ORDER — LISINOPRIL 40 MG PO TABS: 40.0000 mg | ORAL_TABLET | Freq: Every day | ORAL | 2 refills | Status: DC

## 2018-11-06 NOTE — Progress Notes (Addendum)
Virtual Visit via Telephone Note   This visit type was conducted due to national recommendations for restrictions regarding the COVID-19 Pandemic (e.g. social distancing) in an effort to limit this patient's exposure and mitigate transmission in our community.  Due to her co-morbid illnesses, this patient is at least at moderate risk for complications without adequate follow up.  This format is felt to be most appropriate for this patient at this time.  The patient did not have access to video technology/had technical difficulties with video requiring transitioning to audio format only (telephone).  All issues noted in this document were discussed and addressed.  No physical exam could be performed with this format.  Please refer to the patient's chart for her  consent to telehealth for [facility].   Date:  11/06/2018   ID:  Amber Wong, DOB 01/14/1956, MRN [id number]  Patient Location: Home Provider Location: Home  PCP:  Everardo Beals, NP  Cardiologist:  Shelva Majestic, MD  Electrophysiologist:  None   Evaluation Performed:  Follow-Up Visit  Chief Complaint:  followup  History of Present Illness:    Amber Wong is a 63 y.o. 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 Myoview for atypical chest pain in August 2017 was normal.  Echocardiogram in August 2018 showed EF 55 to 60%, grade 1 DD, ascending aorta measuring 39 mm, PA peak pressure 25 mmHg. I last saw the patient in March 2019 at which time she continued to have atypical chest discomfort.  Myoview obtained in April 2019 was low risk with EF of 63%.  Patient was last seen by Dr. Claiborne Billings in September 2019, she was not using her CPAP therapy consistently at the time.  More recently, patient was seen in the ED for vertigo.  Her blood pressure at the time was over 200. EKG was normal. K was low at 2.9.  Troponin normal.  Patient was contacted today via telephone visit.  Of the appointment was subsequently scheduled as a video visit, however due to technical difficulty, this was later switched to telephone instead.  She continued to have the atypical chest pain she had last year.  This only occurs once in several weeks.  At the last episode was 3 weeks ago.  She also has a right shoulder pain radiating down the right elbow.  She says this only occurs when she is eating too much salt and therefore she felt this is likely related to her high blood pressure.  She says her right arm pain is not triggered by arm position or laying on that side.  The arm pain does not occur at the same time as the chest pain.  Otherwise, she says her blood pressure continue to be elevated in the 150s at home.  I will increase lisinopril to 40 mg daily.  She is on 20 mEq twice daily of potassium as well.  I plan to obtain a basic metabolic panel in 1 week, if potassium is high, I plan to lower the dose of potassium supplement.  Otherwise, given negative Myoview in 2019 and the atypical nature of her chest pain, she is cleared to proceed with mass excision by Dr. Burney Gauze.  This is considered a low risk surgery.  The patient does not have symptoms concerning for COVID-19 infection (fever, chills, cough, or new shortness of breath).    Past Medical  History:  Diagnosis Date   Asthma    Depression    Hypercholesteremia    Hypertension    Morbid obesity ([place])    Nocturia    Obstructive sleep apnea (adult) (pediatric) 12/27/2012   AHI 17.7 baseline, titrated to 6 cm , AHi now 2.4 - compliance    OSA (obstructive sleep apnea)    w/ hypoventilation   Sleep apnea    Past Surgical History:  Procedure Laterality Date   BREAST LUMPECTOMY Right    benign at age 54   CORONARY ARTERY BYPASS GRAFT N/A 10/20/2013   Procedure: CORONARY ARTERY BYPASS GRAFTING (CABG);  Surgeon: Ivin Poot, MD;  Location: [facility];   Service: Open Heart Surgery;  Laterality: N/A;  CABG x 3   CYSTECTOMY     INTRAOPERATIVE TRANSESOPHAGEAL ECHOCARDIOGRAM N/A 10/20/2013   Procedure: INTRAOPERATIVE TRANSESOPHAGEAL ECHOCARDIOGRAM;  Surgeon: Ivin Poot, MD;  Location: [facility];  Service: Open Heart Surgery;  Laterality: N/A;   KNEE SURGERY     chronic pain and limping since   LEFT HEART CATHETERIZATION WITH CORONARY ANGIOGRAM N/A 10/16/2013   Procedure: LEFT HEART CATHETERIZATION WITH CORONARY ANGIOGRAM;  Surgeon: Troy Sine, MD;  Location: [facility];  Service: Cardiovascular;  Laterality: N/A;   PARTIAL HYSTERECTOMY     TONSILLECTOMY       Current Meds  Medication Sig   acyclovir (ZOVIRAX) 200 MG capsule Take 200 mg by mouth every morning.   allopurinol (ZYLOPRIM) 100 MG tablet Take 100 mg by mouth 3 (three) times daily.   amoxicillin-clavulanate (AUGMENTIN) 875-125 MG tablet Take 1 tablet by mouth every 12 (twelve) hours.   aspirin 81 MG tablet Take 81 mg by mouth daily.    atorvastatin (LIPITOR) 40 MG tablet Take 1 tablet (40 mg total) by mouth daily.   buPROPion (WELLBUTRIN) 100 MG tablet Take 100 mg by mouth 2 (two) times daily.   cholecalciferol (VITAMIN D) 1000 units tablet Take 1,000 Units by mouth daily.   cromolyn (OPTICROM) 4 % ophthalmic solution Place 1 drop into both eyes 4 (four) times daily.   diazepam (VALIUM) 5 MG tablet Take 1 tablet (5 mg total) by mouth every 8 (eight) hours as needed (dizziness).   fluconazole (DIFLUCAN) 150 MG tablet Take 1 tablet by mouth daily as needed. When yeast infection is present   Fluticasone-Salmeterol (ADVAIR) 250-50 MCG/DOSE AEPB Inhale 1 puff into the lungs 2 (two) times daily.   lisinopril (PRINIVIL,ZESTRIL) 20 MG tablet Take 1.5 tablets (30 mg total) by mouth daily.   meloxicam (MOBIC) 15 MG tablet Take 15 mg by mouth daily.   metoprolol tartrate (LOPRESSOR) 25 MG tablet TAKE 2 TABLETS IN THE MORNING AND 1 TABLET AT NIGHT   nitroGLYCERIN  (NITROSTAT) 0.4 MG SL tablet Place 1 tablet (0.4 mg total) under the tongue every 5 (five) minutes as needed for chest pain.   ondansetron (ZOFRAN ODT) 4 MG disintegrating tablet Take 1 tablet (4 mg total) by mouth every 6 (six) hours as needed for nausea or vomiting.   pantoprazole (PROTONIX) 40 MG tablet Take 1 tablet (40 mg total) by mouth 2 (two) times daily.   PARoxetine (PAXIL) 20 MG tablet Take 20 mg by mouth every morning.   potassium chloride SA (K-DUR) 20 MEQ tablet Take 1 tablet (20 mEq total) by mouth 2 (two) times daily.   triamcinolone cream (KENALOG) 0.1 % Apply 1 application topically 2 (two) times daily.     Allergies:   Iohexol; Penicillins; and Shellfish allergy  Social History   Tobacco Use   Smoking status: Former Smoker    Years: 15.00    Types: Cigarettes   Smokeless tobacco: Never Used   Tobacco comment: Quit 2005  Substance Use Topics   Alcohol use: No   Drug use: No     Family Hx: The patient's family history includes Cancer in her sister; Heart attack in her sister; Heart attack (age of onset: 67) in her mother; Heart attack (age of onset: 72) in her father.  ROS:   Please see the history of present illness.     All other systems reviewed and are negative.   Prior CV studies:   The following studies were reviewed today:  Myoview 10/03/2017 Study Highlights     The left ventricular ejection fraction is normal (55-65%).  Nuclear stress EF: 63%.  There was no ST segment deviation noted during stress.  The study is normal.  This is a low risk study.   Normal stress nuclear study with no ischemia or infarction; EF 63 with normal wall motion.     Labs/Other Tests and Data Reviewed:    EKG:  An ECG dated 10/22/2018 was personally reviewed today and demonstrated:  Normal sinus rhythm with nonspecific T wave changes  Recent Labs: 04/03/2018: ALT 24; TSH 1.250 10/22/2018: BUN 20; Creatinine, Ser 0.83; Hemoglobin 13.5; Platelets  214; Potassium 2.9; Sodium 139   Recent Lipid Panel Lab Results  Component Value Date/Time   CHOL 136 04/03/2018 11:45 AM   TRIG 72 04/03/2018 11:45 AM   HDL 49 04/03/2018 11:45 AM   CHOLHDL 2.8 04/03/2018 11:45 AM   CHOLHDL 2.8 07/07/2016 03:01 PM   LDLCALC 73 04/03/2018 11:45 AM    Wt Readings from Last 3 Encounters:  11/06/18 236 lb (107 kg)  10/22/18 235 lb (106.6 kg)  03/06/18 234 lb 3.2 oz (106.2 kg)     Objective:    Vital Signs:  Ht 5\' 8"  (1.727 m)    Wt 236 lb (107 kg)    BMI 35.88 kg/m    VITAL SIGNS:  reviewed  ASSESSMENT & PLAN:    1. Preoperative clearance: She denies any recent chest discomfort.  Previous Myoview obtained in 2019 was low risk.  She may proceed with her procedure without further work-up.  2. CAD s/p CABG: Denies any recent chest discomfort.  On aspirin and Lipitor.  3. Hypertension: Increase lisinopril to 40 mg daily.  4. Hyperlipidemia: On Lipitor 40 mg daily.  5. Obstructive sleep apnea: Require CPAP therapy.  Her CPAP machine is broken.  I will forward her message to our obstructive sleep apnea coordinator to help her obtain a new machine.  6. Morbid obesity: Weight loss is imperative  7. Hypokalemia: Increase lisinopril to 40 mg daily.  Repeat basic metabolic panel in 1 week  COVID-19 Education: The signs and symptoms of COVID-19 were discussed with the patient and how to seek care for testing (follow up with PCP or arrange E-visit).  The importance of social distancing was discussed today.  Time:   Today, I have spent 19 minutes with the patient with telehealth technology discussing the above problems.     Medication Adjustments/Labs and Tests Ordered: Current medicines are reviewed at length with the patient today.  Concerns regarding medicines are outlined above.   Tests Ordered: No orders of the defined types were placed in this encounter.   Medication Changes: No orders of the defined types were placed in this  encounter.   Disposition:  Follow up in 3 month(s)  Signed, Almyra Deforest, [place]  11/06/2018 1:28 PM    [facility]

## 2018-11-06 NOTE — Patient Instructions (Addendum)
Medication Instructions:   INCREASE Lasix to 40 Mg daily  If you need a refill on your cardiac medications before your next appointment, please call your pharmacy.   Lab work:  You will need to come into the office in 1 week to have labs (blood work) drawn:   BMET  If you have labs (blood work) drawn today and your tests are completely normal, you will receive your results only by: Marland Kitchen MyChart Message (if you have MyChart) OR . A paper copy in the mail If you have any lab test that is abnormal or we need to change your treatment, we will call you to review the results.  Testing/Procedures:  NONE ordered at this time of appointment   Follow-Up: At [facility], you and your health needs are our priority.  As part of our continuing mission to provide you with exceptional heart care, we have created designated Provider Care Teams.  These Care Teams include your primary Cardiologist (physician) and Advanced Practice Providers (APPs -  Physician Assistants and Nurse Practitioners) who all work together to provide you with the care you need, when you need it. . Your physician recommends that you keep your scheduled  follow-up appointment in August with Troy Sine, MD   Any Other Special Instructions Will Be Listed Below (If Applicable).  You are cleared for surgery

## 2018-11-15 ENCOUNTER — Other Ambulatory Visit: Payer: Self-pay | Admitting: Orthopedic Surgery

## 2018-11-19 LAB — BASIC METABOLIC PANEL
BUN/Creatinine Ratio: 10 — ABNORMAL LOW (ref 12–28)
BUN: 9 mg/dL (ref 8–27)
CO2: 22 mmol/L (ref 20–29)
Calcium: 9.3 mg/dL (ref 8.7–10.3)
Chloride: 102 mmol/L (ref 96–106)
Creatinine, Ser: 0.88 mg/dL (ref 0.57–1.00)
GFR calc Af Amer: 81 mL/min/{1.73_m2} (ref >59)
GFR calc non Af Amer: 71 mL/min/{1.73_m2} (ref >59)
Glucose: 75 mg/dL (ref 65–99)
Potassium: 3.7 mmol/L (ref 3.5–5.2)
Sodium: 142 mmol/L (ref 134–144)

## 2018-11-19 NOTE — Progress Notes (Signed)
Renal function and electrolyte stable. Low potassium has resolved.

## 2018-11-21 NOTE — Progress Notes (Signed)
The patient has been notified of the result and verbalized understanding.  All questions (if any) were answered. Jacqulynn Cadet, [place] 11/21/2018 12:54 PM

## 2018-12-05 NOTE — Telephone Encounter (Signed)
Opened in error

## 2019-02-10 ENCOUNTER — Telehealth: Payer: Self-pay | Admitting: Cardiovascular Disease

## 2019-02-10 ENCOUNTER — Telehealth (INDEPENDENT_AMBULATORY_CARE_PROVIDER_SITE_OTHER): Payer: Medicaid Other | Admitting: Cardiovascular Disease

## 2019-02-10 ENCOUNTER — Telehealth: Payer: Medicaid Other | Admitting: Cardiovascular Disease

## 2019-02-10 ENCOUNTER — Encounter: Payer: Self-pay | Admitting: Cardiovascular Disease

## 2019-02-10 VITALS — Ht 68.0 in | Wt 235.0 lb

## 2019-02-10 DIAGNOSIS — G4733 Obstructive sleep apnea (adult) (pediatric): Secondary | ICD-10-CM | POA: Diagnosis not present

## 2019-02-10 DIAGNOSIS — I10 Essential (primary) hypertension: Secondary | ICD-10-CM

## 2019-02-10 DIAGNOSIS — I2583 Coronary atherosclerosis due to lipid rich plaque: Secondary | ICD-10-CM

## 2019-02-10 DIAGNOSIS — E785 Hyperlipidemia, unspecified: Secondary | ICD-10-CM | POA: Diagnosis not present

## 2019-02-10 DIAGNOSIS — I2510 Atherosclerotic heart disease of native coronary artery without angina pectoris: Secondary | ICD-10-CM | POA: Diagnosis not present

## 2019-02-10 DIAGNOSIS — E668 Other obesity: Secondary | ICD-10-CM

## 2019-02-10 NOTE — Telephone Encounter (Signed)
Spoke to pt and gave after visit summary instructions from virtual visit today with Dr. Claiborne Billings. Pt stated that since she will not have appt time for blood work, transportation will likely call the office to make sure pt will need to come have blood work. Told pt I would put in a telephone note. Pt verbalized thanks.

## 2019-02-10 NOTE — Progress Notes (Signed)
Virtual Visit via Telephone Note   This visit type was conducted due to national recommendations for restrictions regarding the COVID-19 Pandemic (e.g. social distancing) in an effort to limit this patient's exposure and mitigate transmission in our community.  Due to her co-morbid illnesses, this patient is at least at moderate risk for complications without adequate follow up.  This format is felt to be most appropriate for this patient at this time.  The patient did not have access to video technology/had technical difficulties with video requiring transitioning to audio format only (telephone).  All issues noted in this document were discussed and addressed.  No physical exam could be performed with this format.  Please refer to the patient's chart for her  consent to telehealth for [facility].   Date:  02/10/2019   ID:  Amber Wong, DOB [birth year], MRN [id number]  Patient Location: Home Provider Location: Office  PCP:  Everardo Beals, NP  Cardiologist:  Shelva Majestic, MD  Electrophysiologist:  None   Evaluation Performed:  Follow-Up Visit  Chief Complaint: 72-month follow-up evaluation  History of Present Illness:    Amber Wong is a 63 y.o. female who has a history of morbid obesity, hypertension, hyperlipidemia, and strong family history for CAD.  She was admitted to [facility] hospital in May 2015 with chest pain described as a "elephant " sitting on her chest.  I performed cardiac catheterization on 10/16/2013 and she was found to have hyperdynamic LV function and evidence for severe 80% distal left main stenosis with 50% diffuse proximal LAD stenosis and otherwise normal circumflex and RCA vessels.  She underwent CABG x3 revascularization surgery by Dr. Myles Lipps and had a LIMA placed to her LAD, SVG to obtuse marginal, and SVG to her diagonal vessel with Endo vascular vein harvesting from her right thigh.   She was seen by Tarri Fuller in October  2015 with complaints involving her left leg, knee, and great toe.  When I saw her in February 2016 she noted marked improvement in her previous chest pain symptomatology.  She has a history of hypertension and has been on Lopressor 25 g twice a day and lisinopril 10 mg daily and alupurinol for gout.  She has GERD which is been controlled with Protonix 40 mg daily.  She has been on Lipitor 20 mg for hyperlipidemia.  When I  saw her in 2016 she had begun to notice left-sided chest and arm discomfort which may have had some similar characteristics than her chest pain prior to surgery. She underwent a nuclear perfusion study on 03/25/2015 which revealed normal perfusion.  Ejection fraction was 67%.  Her chest pain  resolved.    She saw Nell Range in June 2017 and because of recurrent somewhat atypical chest pain.  She underwent a repeat nuclear stress test which was done on 01/12/2016 and remained normal.  She has a history of asthma and is was started on Advair.  She also has a history of obstructive sleep apnea and [employer] as her DME company.  Her last CPAP machine was in 2014.    When I saw her in May 2018, blood pressure was improved with titration of her lisinopril.  She presented to the emergency room in June 2018 with left upper chest and shoulder pain which was felt to be musculoskeletal rather than cardiac in etiology.  Her ECG was unchanged.    In August 2018 an echo Doppler study showed an EF of 55-60%.  Grade 1 diastolic  dysfunction.  There ascending aortic diameter was 39 mm.  PA pressure was 25 mmHg.    She was seen by Almyra Deforest, PAC in March 2019 and at that time had complaints of intermittent chest discomfort for several months.  Her chest pain had atypical features.  She was referred for nuclear perfusion study which was done on October 02, 2017 and was low risk which showed normal perfusion and function.  EF was 63%.  He had repeat laboratory which showed a cholesterol of 170,  triglycerides 86, HDL 56, LDL 97.  When I previously saw her she was not using CPAP consistently.  I last saw her in September 2019 which time she was using CPAP in excess of 90% of the nights and her sleeping was markedly improved.  Her DME company was [employer] and they have been brought out by [employer].  When last saw her, her machine was still functioning.  She subsequently was seen virtually by Almyra Deforest, [facility] in May 2020 at which time her machine was broken.  She is in need of a new machine her machine is old needs criteria for a new wireless machine.  She admits to fatigue and at times have noticed some episodes of vertigo.  She apparently was found to be hypokalemic.  She has noticed blood pressure lability.  She has continued to be on lisinopril 40 mg daily, metoprolol 50 mg in the morning and 25 mg at night.  She presents for evaluation  The patient does not have symptoms concerning for COVID-19 infection (fever, chills, cough, or new shortness of breath).    Past Medical History:  Diagnosis Date  . Asthma   . Depression   . Hypercholesteremia   . Hypertension   . Morbid obesity ([place])   . Nocturia   . Obstructive sleep apnea (adult) (pediatric) 12/27/2012   AHI 17.7 baseline, titrated to 6 cm , AHi now 2.4 - compliance   . OSA (obstructive sleep apnea)    w/ hypoventilation  . Sleep apnea    Past Surgical History:  Procedure Laterality Date  . BREAST LUMPECTOMY Right    benign at age 60  . CORONARY ARTERY BYPASS GRAFT N/A 10/20/2013   Procedure: CORONARY ARTERY BYPASS GRAFTING (CABG);  Surgeon: Ivin Poot, MD;  Location: [place];  Service: Open Heart Surgery;  Laterality: N/A;  CABG x 3  . CYSTECTOMY    . INTRAOPERATIVE TRANSESOPHAGEAL ECHOCARDIOGRAM N/A 10/20/2013   Procedure: INTRAOPERATIVE TRANSESOPHAGEAL ECHOCARDIOGRAM;  Surgeon: Ivin Poot, MD;  Location: [place];  Service: Open Heart Surgery;  Laterality: N/A;  . KNEE SURGERY     chronic pain and limping since   . LEFT HEART CATHETERIZATION WITH CORONARY ANGIOGRAM N/A 10/16/2013   Procedure: LEFT HEART CATHETERIZATION WITH CORONARY ANGIOGRAM;  Surgeon: Troy Sine, MD;  Location: [facility] CATH LAB;  Service: Cardiovascular;  Laterality: N/A;  . PARTIAL HYSTERECTOMY    . TONSILLECTOMY       Current Meds  Medication Sig  . acyclovir (ZOVIRAX) 200 MG capsule Take 200 mg by mouth every morning.  Marland Kitchen allopurinol (ZYLOPRIM) 100 MG tablet Take 100 mg by mouth 3 (three) times daily.  Marland Kitchen aspirin 81 MG tablet Take 81 mg by mouth daily.   Marland Kitchen atorvastatin (LIPITOR) 40 MG tablet Take 1 tablet (40 mg total) by mouth daily.  Marland Kitchen buPROPion (WELLBUTRIN) 100 MG tablet Take 100 mg by mouth 2 (two) times daily.  . cholecalciferol (VITAMIN D) 1000 units tablet Take 1,000  Units by mouth daily.  . cromolyn (OPTICROM) 4 % ophthalmic solution Place 1 drop into both eyes 4 (four) times daily.  . fluconazole (DIFLUCAN) 150 MG tablet Take 1 tablet by mouth daily as needed. When yeast infection is present  . lisinopril (ZESTRIL) 40 MG tablet Take 1 tablet (40 mg total) by mouth daily.  . meclizine (ANTIVERT) 25 MG tablet meclizine 25 mg tablet  Take 1 tablet 3 times a day by oral route as needed for 10 days.  . meloxicam (MOBIC) 15 MG tablet Take 15 mg by mouth daily.  . metoprolol tartrate (LOPRESSOR) 25 MG tablet TAKE 2 TABLETS IN THE MORNING AND 1 TABLET AT NIGHT  . nitroGLYCERIN (NITROSTAT) 0.4 MG SL tablet Place 1 tablet (0.4 mg total) under the tongue every 5 (five) minutes as needed for chest pain.  . pantoprazole (PROTONIX) 40 MG tablet Take 1 tablet (40 mg total) by mouth 2 (two) times daily.  Marland Kitchen PARoxetine (PAXIL) 20 MG tablet Take 20 mg by mouth every morning.  . potassium chloride SA (K-DUR) 20 MEQ tablet Take 1 tablet (20 mEq total) by mouth 2 (two) times daily.  Marland Kitchen triamcinolone cream (KENALOG) 0.1 % Apply 1 application topically 2 (two) times daily.     Allergies:   Iohexol, Penicillins, and Shellfish allergy   Social  History   Tobacco Use  . Smoking status: Former Smoker    Years: 15.00    Types: Cigarettes  . Smokeless tobacco: Never Used  . Tobacco comment: Quit 2005  Substance Use Topics  . Alcohol use: No  . Drug use: No     Family Hx: The patient's family history includes Cancer in her sister; Heart attack in her sister; Heart attack (age of onset: 85) in her mother; Heart attack (age of onset: 58) in her father.  ROS:   Please see the history of present illness.    Negative for fever chills night sweats. Negative for cough loss of taste or smell No wheezing Positive for fatigue No chest pressure No palpitations No bleeding Positive for increased stress Intermittent left arm discomfort, nonexertional No swelling Positive for OSA CPAP All other systems reviewed and are negative.   Prior CV studies:   The following studies were reviewed today:  ECHO 01/12/2018 Study Conclusions  - Left ventricle: The cavity size was normal. Wall thickness was   normal. Systolic function was normal. The estimated ejection   fraction was in the range of 55% to 60%. Although no diagnostic   regional wall motion abnormality was identified, this possibility   cannot be completely excluded on the basis of this study. Doppler   parameters are consistent with abnormal left ventricular   relaxation (grade 1 diastolic dysfunction). - Aortic valve: There was no stenosis. - Aorta: Ascending aortic diameter: 39 mm (S). - Ascending aorta: The ascending aorta was mildly dilated. - Mitral valve: There was trivial regurgitation. - Right ventricle: The cavity size was mildly dilated. Systolic   function was mildly reduced. - Pulmonary arteries: PA peak pressure: 25 mm Hg (S). - Inferior vena cava: The vessel was normal in size. The   respirophasic diameter changes were in the normal range (>= 50%),   consistent with normal central venous pressure.  Impressions:  - Normal LV size and systolic function,  EF 0000000. Normal RV size   and systolic function. No significant valvular abnormalities.   Labs/Other Tests and Data Reviewed:    EKG:  An ECG dated 10/22/2018 was personally reviewed  today and demonstrated:  Normal sinus rhythm at 69 bpm T wave inversion V1 and V2, normal intervals  Recent Labs: 04/03/2018: ALT 24; TSH 1.250 10/22/2018: Hemoglobin 13.5; Platelets 214 11/19/2018: BUN 9; Creatinine, Ser 0.88; Potassium 3.7; Sodium 142   Recent Lipid Panel Lab Results  Component Value Date/Time   CHOL 136 04/03/2018 11:45 AM   TRIG 72 04/03/2018 11:45 AM   HDL 49 04/03/2018 11:45 AM   CHOLHDL 2.8 04/03/2018 11:45 AM   CHOLHDL 2.8 07/07/2016 03:01 PM   LDLCALC 73 04/03/2018 11:45 AM    Wt Readings from Last 3 Encounters:  02/10/19 235 lb (106.6 kg)  11/06/18 236 lb (107 kg)  10/22/18 235 lb (106.6 kg)     Objective:    Vital Signs:  Ht 5\' 8"  (1.727 m)   Wt 235 lb (106.6 kg)   BMI 35.73 kg/m    Since this was a phone visit I could not visually inspect the patient. Patient did not have the capability to check her blood pressure but states recently her blood pressure has been elevated at 154/102 Her breathing was unlabored There was no audible wheezing She denied any discomfort to her chest wall There was no abdominal tenderness No edema per her evaluation She denied neurologic symptoms but at times admits to intermittent left arm discomfort He had normal affect and mood  ASSESSMENT & PLAN:    1. CAD: She was found to have severe multivessel CAD in May 2015 and underwent CABG surgery x3 her left coronary system with a LIMA to LAD, SVG to OM, and SVG to her and diagonal vessel.  RCA was not bypassed.  Last nuclear perfusion study showed normal perfusion.  She is not having any anginal symptoms. 2. Central hypertension.  She is now on lisinopril which had been titrated to 40 mg, in addition to metoprolol 50 mg in the morning and 25 mg at night.  She did not have the ability to  check her blood pressure today.  Her dose of lisinopril had been increased at her last visit.  She will need a follow-up appointment in the office for more optimal reassessment 3. Hyperlipidemia: She continues to be on atorvastatin 40 mg.  Target LDL is less than 70.  A complete set of fasting laboratory will be obtained. 4. Obstructive sleep apnea: She received her CPAP machine in 2014.  When I last saw her it was functioning but since that time her machine has broken as result she has not been able to use it.  [employer] was her DME company and they are now bought out by [employer].  She was very compliant with CPAP therapy prior to her machine breaking.  I will write her prescription for a new ResMed air sense 10 auto CPAP unit and we will initially set a minimum pressure of 10 up to a maximum of 20 cm of water.  I will see her within 2 months after her new machine is set up for further evaluation and assessment. 5. Morbid obesity: BMI is 35.73.  We discussed the importance of weight loss and exercise.  COVID-19 Education: The signs and symptoms of COVID-19 were discussed with the patient and how to seek care for testing (follow up with PCP or arrange E-visit).  The importance of social distancing was discussed today.  Time:   Today, I have spent 26 minutes with the patient with telehealth technology discussing the above problems.     Medication Adjustments/Labs and Tests Ordered: Current  medicines are reviewed at length with the patient today.  Concerns regarding medicines are outlined above.   Tests Ordered: No orders of the defined types were placed in this encounter.   Medication Changes: No orders of the defined types were placed in this encounter.   Follow Up: Scheduled her to undergo repeat laboratory in the fasting state consisting of a comprehensive metabolic panel, CBC, TSH, and lipid studies.  N office evaluation in 2 months  Signed, Shelva Majestic, MD  02/10/2019 12:52 PM     [place]

## 2019-02-10 NOTE — Patient Instructions (Signed)
Medication Instructions:  Your physician recommends that you continue on your current medications as directed. Please refer to the Current Medication list given to you today.  If you need a refill on your cardiac medications before your next appointment, please call your pharmacy.   Lab work: Your physician recommends that you return for a FASTING lipid profile, CMET, CBC, and TSH. You do not need an appointment to have bloodwork done in our office. Please bring your lab slips with you when you return. I am mailing your lab slips to you.  If you have labs (blood work) drawn today and your tests are completely normal, you will receive your results only by: Marland Kitchen MyChart Message (if you have MyChart) OR . A paper copy in the mail If you have any lab test that is abnormal or we need to change your treatment, we will call you to review the results.   Follow-Up: At [facility], you and your health needs are our priority.  As part of our continuing mission to provide you with exceptional heart care, we have created designated Provider Care Teams.  These Care Teams include your primary Cardiologist (physician) and Advanced Practice Providers (APPs -  Physician Assistants and Nurse Practitioners) who all work together to provide you with the care you need, when you need it. You will need a follow up appointment in 2 months (OFFICE VISIT only).  Please call our office 2 months in advance to schedule this appointment.  You may see Shelva Majestic, MD or one of the following Advanced Practice Providers on your designated Care Team: [place], [place] . Fabian Sharp, PA-C  Any Other Special Instructions Will Be Listed Below (If Applicable). I have sent a message to our Sleep Coordinator, Barry Brunner. She will contact you concerning your new CPAP machine.

## 2019-02-14 ENCOUNTER — Other Ambulatory Visit: Payer: Self-pay | Admitting: Cardiovascular Disease

## 2019-02-14 ENCOUNTER — Telehealth: Payer: Self-pay | Admitting: *Deleted

## 2019-02-14 DIAGNOSIS — G4733 Obstructive sleep apnea (adult) (pediatric): Secondary | ICD-10-CM

## 2019-02-14 NOTE — Telephone Encounter (Signed)
-----   Message from Therisa Doyne sent at 02/10/2019  1:13 PM EDT ----- Regarding: new CPAP machine Geronimo Boot!  Dr. Claiborne Billings wants to have this pt set up with a new CPAP machine. When he was on the phone with the patient, he said Resmat on Auto from [place].  Lovena Le :)

## 2019-02-14 NOTE — Telephone Encounter (Signed)
ResMed Airsense 10 Auto CPAP machine orders sent to [employer] via [employer].

## 2019-02-18 LAB — COMPREHENSIVE METABOLIC PANEL
ALT: 18 IU/L (ref 0–32)
AST: 19 IU/L (ref 0–40)
Albumin/Globulin Ratio: 1.5 (ref 1.2–2.2)
Albumin: 4.1 g/dL (ref 3.8–4.8)
Alkaline Phosphatase: 184 IU/L — ABNORMAL HIGH (ref 39–117)
BUN/Creatinine Ratio: 13 (ref 12–28)
BUN: 11 mg/dL (ref 8–27)
Bilirubin Total: 0.5 mg/dL (ref 0.0–1.2)
CO2: 23 mmol/L (ref 20–29)
Calcium: 8.9 mg/dL (ref 8.7–10.3)
Chloride: 101 mmol/L (ref 96–106)
Creatinine, Ser: 0.87 mg/dL (ref 0.57–1.00)
GFR calc Af Amer: 83 mL/min/{1.73_m2} (ref >59)
GFR calc non Af Amer: 72 mL/min/{1.73_m2} (ref >59)
Globulin, Total: 2.8 g/dL (ref 1.5–4.5)
Glucose: 69 mg/dL (ref 65–99)
Potassium: 4 mmol/L (ref 3.5–5.2)
Sodium: 142 mmol/L (ref 134–144)
Total Protein: 6.9 g/dL (ref 6.0–8.5)

## 2019-02-18 LAB — LIPID PANEL
Chol/HDL Ratio: 3.3 ratio (ref 0.0–4.4)
Cholesterol, Total: 154 mg/dL (ref 100–199)
HDL: 47 mg/dL (ref >39)
LDL Chol Calc (NIH): 94 mg/dL (ref 0–99)
Triglycerides: 64 mg/dL (ref 0–149)
VLDL Cholesterol Cal: 13 mg/dL (ref 5–40)

## 2019-02-18 LAB — CBC
Hematocrit: 43.8 % (ref 34.0–46.6)
Hemoglobin: 14.9 g/dL (ref 11.1–15.9)
MCH: 29.2 pg (ref 26.6–33.0)
MCHC: 34 g/dL (ref 31.5–35.7)
MCV: 86 fL (ref 79–97)
Platelets: 221 10*3/uL (ref 150–450)
RBC: 5.11 x10E6/uL (ref 3.77–5.28)
RDW: 12.6 % (ref 11.7–15.4)
WBC: 6.3 10*3/uL (ref 3.4–10.8)

## 2019-02-18 LAB — TSH: TSH: 0.8 u[IU]/mL (ref 0.450–4.500)

## 2019-03-07 MED ORDER — ATORVASTATIN CALCIUM 40 MG PO TABS: 40.0000 mg | ORAL_TABLET | Freq: Every day | ORAL | 3 refills | Status: DC

## 2019-03-12 ENCOUNTER — Telehealth: Payer: Self-pay | Admitting: Cardiovascular Disease

## 2019-03-12 NOTE — Telephone Encounter (Signed)
NOT that I can find

## 2019-03-12 NOTE — Telephone Encounter (Signed)
[place] Up:    Amber Wong wanted to know if you received the Medicaid CMN form that was faxed on 03-05-19. If not, she is faxing another one today, if you did not receive it.

## 2019-03-14 NOTE — Telephone Encounter (Signed)
CMN form- may have been filled out yesterday by [facility], and faxed back.

## 2019-04-08 ENCOUNTER — Encounter: Payer: Self-pay | Admitting: Cardiovascular Disease

## 2019-04-08 ENCOUNTER — Other Ambulatory Visit: Payer: Self-pay

## 2019-04-08 ENCOUNTER — Ambulatory Visit: Payer: Medicaid Other | Admitting: Cardiovascular Disease

## 2019-04-08 DIAGNOSIS — I2510 Atherosclerotic heart disease of native coronary artery without angina pectoris: Secondary | ICD-10-CM | POA: Diagnosis not present

## 2019-04-08 DIAGNOSIS — G4733 Obstructive sleep apnea (adult) (pediatric): Secondary | ICD-10-CM

## 2019-04-08 DIAGNOSIS — E668 Other obesity: Secondary | ICD-10-CM

## 2019-04-08 DIAGNOSIS — Z79899 Other long term (current) drug therapy: Secondary | ICD-10-CM

## 2019-04-08 DIAGNOSIS — Z951 Presence of aortocoronary bypass graft: Secondary | ICD-10-CM

## 2019-04-08 DIAGNOSIS — E785 Hyperlipidemia, unspecified: Secondary | ICD-10-CM

## 2019-04-08 DIAGNOSIS — I10 Essential (primary) hypertension: Secondary | ICD-10-CM

## 2019-04-08 MED ORDER — EZETIMIBE 10 MG PO TABS: 10.0000 mg | ORAL_TABLET | Freq: Every day | ORAL | 3 refills | Status: DC

## 2019-04-08 NOTE — Patient Instructions (Signed)
Medication Instructions:  START ezetimibe (ZETIA) 10 MG tablet IN THE EVENING If you need a refill on your cardiac medications before your next appointment, please call your pharmacy.  Labwork: FASTING LIPID AND CMET IN 3 MONTHS HERE IN OUR OFFICE AT [facility]   You will need to fast. DO NOT EAT OR DRINK PAST MIDNIGHT.       If you have labs (blood work) drawn today and your tests are completely normal, you will receive your results only by: Marland Kitchen MyChart Message (if you have MyChart) OR . A paper copy in the mail If you have any lab test that is abnormal or we need to change your treatment, we will call you to review the results.  Follow-Up: IN 6 months Please call our office 2 months in advance, JAN 2021 to schedule this APR 2021 appointment. In Person You may see Shelva Majestic, MD or one of the following Advanced Practice Providers on your designated Care Team:  Rosaria Ferries, PA-C Jory Sims, DNP, ANP Cadence Kathlen Mody, NP.    At [facility], you and your health needs are our priority.  As part of our continuing mission to provide you with exceptional heart care, we have created designated Provider Care Teams.  These Care Teams include your primary Cardiologist (physician) and Advanced Practice Providers (APPs -  Physician Assistants and Nurse Practitioners) who all work together to provide you with the care you need, when you need it.  Thank you for choosing [facility] at [facility]!!

## 2019-04-08 NOTE — Progress Notes (Signed)
Patient ID: Cassadie Pankonin, female   DOB: February 26, 1956, 63 y.o.   MRN: [id number]     HPI: Lue Sykora is a 63 y.o. female who presents to the office today for a 2 month cardiology/sleep evaluation.   Ms.Lakysha Bauman has a history of morbid obesity, hypertension, hyperlipidemia, and strong family history for CAD.  She was admitted to [facility] hospital in May 2015 with chest pain described as a "elephant "sitting on her chest.  I performed cardiac catheterization on 10/16/2013 and she was found to have hyperdynamic LV function and evidence for severe 80% distal left main stenosis with 50% diffuse proximal LAD stenosis and otherwise normal circumflex and RCA vessels.  She underwent CABG x3 revascularization surgery by Dr. Myles Lipps and had a LIMA placed to her LAD, SVG to obtuse marginal, and SVG to her diagonal vessel with Endo vascular vein harvesting from her right thigh.   She was seen by Tarri Fuller in October 2015 with complaints involving her left leg, knee, and great toe.  When I saw her in February 2016 she noted marked improvement in her previous chest pain symptomatology.  She has a history of hypertension and has been on Lopressor 25 g twice a day and lisinopril 10 mg daily.  She is on how you Purinol for gout.  She has GERD which is been controlled with Protonix 40 mg daily.  She has been on Lipitor 20 mg for hyperlipidemia.  When I  saw her in 2016 she had begun to notice left-sided chest and arm discomfort which may have had some similar characteristics than her chest pain prior to surgery. She underwent a nuclear perfusion study on 03/25/2015 which revealed normal perfusion.  Ejection fraction was 67%.  Her chest pain  resolved.    She saw Nell Range in June 2017 and because of recurrent somewhat atypical chest pain.  She underwent a repeat nuclear stress test which was done on 01/12/2016 and remained normal.  She has a history of asthma and is was  started on Advair.  She also has a history of obstructive sleep apnea and [employer] as her DME company.  Her last CPAP machine was in 2014.    When I saw her in May 2018, blood pressure was improved with titration of her lisinopril.  HEENT in the emergency room in June 2018 with left upper chest and shoulder pain which was felt to be musculoskeletal rather than cardiac in etiology.  Her ECG was on change.  In August 2018.  An echo Doppler study showed an EF of 55-60%.  Grade 1 diastolic dysfunction.  There ascending aortic diameter was 39 mm.  PA pressure was 25 mmHg.    She was seen by Almyra Deforest, PA in March 2019 and at that time had complaints of intermittent chest discomfort for several months.  Her chest pain had atypical features.  She was referred for nuclear perfusion study which was done on October 02, 2017 and was low risk which showed normal perfusion and function.  EF was 63%.  He had repeat laboratory which showed a cholesterol of 170, triglycerides 86, HDL 56, LDL 97.  Last saw her she was not using CPAP consistently.  I strongly reiterated the importance of continued treatment.  She now admits that she is using CPAP in excess of 90% of the night.  Her sleeping has improved.    I  saw her in September 2019 which time she was using CPAP in excess of 90%  of the nights and her sleeping was markedly improved.  Her DME company was [employer] and they have been brought out by [employer].  When last saw her, her machine was still functioning.  She subsequently was seen virtually by Almyra Deforest, [facility] in May 2020 at which time her machine was broken.    I saw her for telemedicine evaluation on February 10, 2019.  She was  in need of a new machine and met criteria for a new wireless machine.  She admits to fatigue and at times have noticed some episodes of vertigo.  She apparently was found to be hypokalemic.  She has noticed blood pressure lability.  She has continued to be on lisinopril 40 mg daily,  metoprolol 50 mg in the morning and 25 mg at night.    Since I last saw her, she was able to obtain a new ResMed air sense 10 CPAP machine.  This was obtained through [employer] as her DME company since she had previously used [facility] [employer].  She has felt well with her new CPAP machine.  She typically goes to bed between 10 and 11 PM and wakes up at 5 AM.  She is sleeping well.  A download was obtained from September 26 through April 06, 2019.  She is meeting compliance standards.  Usage days was 90%.  Average usage was 5 hours and 53 minutes.  She has an auto pressure minimum of 10 with a maximum of 20.  AHI is excellent at 0.9.  Her 95th percentile average pressure is 12.3 with a maximum average pressure 13.2.  There are days of increased mask leak.  She presents for evaluation.   Past Medical History:  Diagnosis Date   Asthma    Depression    Hypercholesteremia    Hypertension    Morbid obesity ([place])    Nocturia    Obstructive sleep apnea (adult) (pediatric) 12/27/2012   AHI 17.7 baseline, titrated to 6 cm , AHi now 2.4 - compliance    OSA (obstructive sleep apnea)    w/ hypoventilation   Sleep apnea     Past Surgical History:  Procedure Laterality Date   BREAST LUMPECTOMY Right    benign at age 46   CORONARY ARTERY BYPASS GRAFT N/A 10/20/2013   Procedure: CORONARY ARTERY BYPASS GRAFTING (CABG);  Surgeon: Ivin Poot, MD;  Location: [place];  Service: Open Heart Surgery;  Laterality: N/A;  CABG x 3   CYSTECTOMY     INTRAOPERATIVE TRANSESOPHAGEAL ECHOCARDIOGRAM N/A 10/20/2013   Procedure: INTRAOPERATIVE TRANSESOPHAGEAL ECHOCARDIOGRAM;  Surgeon: Ivin Poot, MD;  Location: [place];  Service: Open Heart Surgery;  Laterality: N/A;   KNEE SURGERY     chronic pain and limping since   LEFT HEART CATHETERIZATION WITH CORONARY ANGIOGRAM N/A 10/16/2013   Procedure: LEFT HEART CATHETERIZATION WITH CORONARY ANGIOGRAM;  Surgeon: Troy Sine, MD;  Location: [facility] CATH LAB;   Service: Cardiovascular;  Laterality: N/A;   PARTIAL HYSTERECTOMY     TONSILLECTOMY      Allergies  Allergen Reactions   Iohexol Other (See Comments)     Code: HIVES, Desc: premedicated 2 hrs prior with '200mg'$  soulumedrol IV, no reaction s/p injection, Onset Date: [id number]    Penicillins Other (See Comments)    REACTION: pruritis   Shellfish Allergy Other (See Comments)    Cannot have because of gout    Current Outpatient Medications  Medication Sig Dispense Refill   acyclovir (ZOVIRAX) 200 MG capsule Take  200 mg by mouth every morning.     allopurinol (ZYLOPRIM) 100 MG tablet Take 100 mg by mouth 3 (three) times daily.     aspirin 81 MG tablet Take 81 mg by mouth daily.      atorvastatin (LIPITOR) 40 MG tablet Take 1 tablet (40 mg total) by mouth daily. 90 tablet 3   buPROPion (WELLBUTRIN) 100 MG tablet Take 100 mg by mouth 2 (two) times daily.  0   cholecalciferol (VITAMIN D) 1000 units tablet Take 1,000 Units by mouth daily.     cromolyn (OPTICROM) 4 % ophthalmic solution Place 1 drop into both eyes 4 (four) times daily.  2   ezetimibe (ZETIA) 10 MG tablet Take 1 tablet (10 mg total) by mouth daily. 90 tablet 3   fluconazole (DIFLUCAN) 150 MG tablet Take 1 tablet by mouth daily as needed. When yeast infection is present  1   Fluticasone-Salmeterol (ADVAIR) 250-50 MCG/DOSE AEPB Inhale 1 puff into the lungs 2 (two) times daily.     lisinopril (ZESTRIL) 40 MG tablet Take 1 tablet (40 mg total) by mouth daily. 90 tablet 2   meclizine (ANTIVERT) 25 MG tablet meclizine 25 mg tablet  Take 1 tablet 3 times a day by oral route as needed for 10 days.     meloxicam (MOBIC) 15 MG tablet Take 15 mg by mouth daily.     metoprolol tartrate (LOPRESSOR) 25 MG tablet TAKE 2 TABLETS IN THE MORNING AND 1 TABLET AT NIGHT 90 tablet 10   nitroGLYCERIN (NITROSTAT) 0.4 MG SL tablet Place 1 tablet (0.4 mg total) under the tongue every 5 (five) minutes as needed for chest pain. 25 tablet  3   pantoprazole (PROTONIX) 40 MG tablet Take 1 tablet (40 mg total) by mouth 2 (two) times daily. 60 tablet 6   PARoxetine (PAXIL) 20 MG tablet Take 20 mg by mouth every morning.     potassium chloride SA (K-DUR) 20 MEQ tablet Take 1 tablet (20 mEq total) by mouth 2 (two) times daily. 10 tablet 0   triamcinolone cream (KENALOG) 0.1 % Apply 1 application topically 2 (two) times daily.     No current facility-administered medications for this visit.     Social History   Socioeconomic History   Marital status: Legally Separated    Spouse name: Not on file   Number of children: Not on file   Years of education: 12   Highest education level: Not on file  Occupational History    Employer: UNEMPLOYED    Comment: history of shift work  Scientist, product/process development strain: Not on file   Food insecurity    Worry: Not on file    Inability: Not on Lexicographer needs    Medical: Not on file    Non-medical: Not on file  Tobacco Use   Smoking status: Former Smoker    Years: 15.00    Types: Cigarettes   Smokeless tobacco: Never Used   Tobacco comment: Quit 2005  Substance and Sexual Activity   Alcohol use: No   Drug use: No   Sexual activity: Never  Lifestyle   Physical activity    Days per week: Not on file    Minutes per session: Not on file   Stress: Not on file  Relationships   Social connections    Talks on phone: Not on file    Gets together: Not on file    Attends religious service: Not on file  Active member of club or organization: Not on file    Attends meetings of clubs or organizations: Not on file    Relationship status: Not on file   Intimate partner violence    Fear of current or ex partner: Not on file    Emotionally abused: Not on file    Physically abused: Not on file    Forced sexual activity: Not on file  Other Topics Concern   Not on file  Social History Narrative   Patient lives at home alone.     Family  History  Problem Relation Age of Onset   Heart attack Mother 65       died of MI   Heart attack Father 60       died of MI   Cancer Sister        double mastectomy   Heart attack Sister        2 sisters in their 29's with MI/stents    ROS General: Negative; No fevers, chills, or night sweats; positive for obesity HEENT: Negative; No changes in vision or hearing, sinus congestion, difficulty swallowing Pulmonary: Negative; No cough, wheezing, shortness of breath, hemoptysis Cardiovascular: See HPI:  GI: Negative; No nausea, vomiting, diarrhea, or abdominal pain GU: Negative; No dysuria, hematuria, or difficulty voiding Musculoskeletal: Negative; no myalgias, joint pain, or weakness Hematologic: Negative; no easy bruising, bleeding Rheumatologic: Positive for gout Endocrine: Negative; no heat/cold intolerance; no diabetes, Neuro: Negative; no changes in balance, headaches Skin: Negative; No rashes or skin lesions Psychiatric: Negative; No behavioral problems, depression Sleep: Positive for obstructive sleep apnea ; received a new ResMed air sense 10 AutoSet unit; no breakthrough snoring,  daytime sleepiness, hypersomnolence, bruxism, restless legs, hypnogognic hallucinations. Other comprehensive 14 point system review is negative   Physical Exam BP (!) 145/86    Pulse 66    Temp (!) 97.1 F (36.2 C)    Ht '5\' 8"'$  (1.727 m)    Wt 225 lb 6.4 oz (102.2 kg)    SpO2 97%    BMI 34.27 kg/m    Repeat blood pressure by me 122/80  Wt Readings from Last 3 Encounters:  04/08/19 225 lb 6.4 oz (102.2 kg)  02/10/19 235 lb (106.6 kg)  11/06/18 236 lb (107 kg)   General: Alert, oriented, no distress.  Skin: normal turgor, no rashes, warm and dry HEENT: Normocephalic, atraumatic. Pupils equal round and reactive to light; sclera anicteric; extraocular muscles intact;  Nose without nasal septal hypertrophy Mouth/Parynx benign; Mallinpatti scale 3 Neck: No JVD, no carotid bruits; normal  carotid upstroke Lungs: clear to ausculatation and percussion; no wheezing or rales Chest wall: without tenderness to palpitation Heart: PMI not displaced, RRR, s1 s2 normal, 1/6 systolic murmur, no diastolic murmur, no rubs, gallops, thrills, or heaves Abdomen: soft, nontender; no hepatosplenomehaly, BS+; abdominal aorta nontender and not dilated by palpation. Back: no CVA tenderness Pulses 2+ Musculoskeletal: full range of motion, normal strength, no joint deformities Extremities: no clubbing cyanosis or edema, Homan's sign negative  Neurologic: grossly nonfocal; Cranial nerves grossly wnl Psychologic: Normal mood and affect   ECG (independently read by me): Normal sinus rhythm at 65 bpm.  Nonspecific T wave abnormality.  Normal intervals.  No ectopy  ECG (independently read by me): Normal sinus rhythm at 66 bpm.  Nonspecific T changes.  Normal intervals.  No ectopy.  November 2018 ECG (independently read by me): Normal sinus rhythm at 67 bpm.  Normal intervals.  Nondiagnostic T changes.  May 2018  ECG (independently read by me): normal sinus rhythm at 65 bpm.  Nonspecific T changes.  Normal intervals.  No ectopy.  January 2018 ECG (independently read by me): Normal sinus rhythm at 63 bpm.  No ectopy.  Nonspecific T changes.  Poor anterior R-wave progression.  March 19 2015 ECG (independently read by me): Normal sinus rhythm at 60 bpm.  Nonspecific T changes.   PriorECG (independently read by me): Normal sinus rhythm at 65 bpm.  Nonspecific T-wave changes V1 and V2.  Normal intervals.  LABS:  BMP Latest Ref Rng & Units 02/18/2019 11/19/2018 10/22/2018  Glucose 65 - 99 mg/dL 69 75 145(H)  BUN 8 - 27 mg/dL _0 Creatinine 0.57 - 1.00 mg/dL 0.87 0.88 0.83  BUN/Creat Ratio 12 - 28 13 10(L) -  Sodium 134 - 144 mmol/L 142 142 139  Potassium 3.5 - 5.2 mmol/L 4.0 3.7 2.9(L)  Chloride 96 - 106 mmol/L 101 102 103  CO2 20 - 29 mmol/L _1 Calcium 8.7 - 10.3 mg/dL 8.9 9.3 9.4    Hepatic Function Latest Ref Rng & Units 02/18/2019 04/03/2018 10/02/2017  Total Protein 6.0 - 8.5 g/dL 6.9 6.6 7.1  Albumin 3.8 - 4.8 g/dL 4.1 4.0 4.3  AST 0 - 40 IU/L _2 ALT 0 - 32 IU/L _3 Alk Phosphatase 39 - 117 IU/L 184(H) 193(H) 162(H)  Total Bilirubin 0.0 - 1.2 mg/dL 0.5 0.4 0.4  Bilirubin, Direct 0.00 - 0.40 mg/dL - - 0.15    CBC Latest Ref Rng & Units 02/18/2019 10/22/2018 04/03/2018  WBC 3.4 - 10.8 x10E3/uL 6.3 8.7 6.5  Hemoglobin 11.1 - 15.9 g/dL 14.9 13.5 13.8  Hematocrit 34.0 - 46.6 % 43.8 41.1 40.3  Platelets 150 - 450 x10E3/uL 221 214 281    BNP    Component Value Date/Time   BNP 148.4 (H) 11/30/2016 1750    ProBNP    Component Value Date/Time   PROBNP 41.4 10/15/2013 0802   Lipid Panel     Component Value Date/Time   CHOL 154 02/18/2019 1002   TRIG 64 02/18/2019 1002   HDL 47 02/18/2019 1002   CHOLHDL 3.3 02/18/2019 1002   CHOLHDL 2.8 07/07/2016 1501   VLDL 16 07/07/2016 1501   LDLCALC 94 02/18/2019 1002     RADIOLOGY: No results found.   IMPRESSION:  1. OSA (obstructive sleep apnea)   2. Essential hypertension   3. CAD in native artery   4. Hx of CABG   5. Hyperlipidemia with target LDL less than 70   6. Moderate obesity   7. Medication management     ASSESSMENT AND PLAN: Ms.Ousmane-Alhassan is a 63 year-old female who was found to have severe coronary obstructive disease at diagnostic catheterization in May 2015 and underwent CABG revascularization surgery 3 to her left coronary system.  Her RCA was not bypassed.  She developed left-sided and chest discomfort and  was concerned that this may be similar to her prior discomfor in 2016. A Lexiscan Myoview study revealed normal perfusion.  She underwent an additional nuclear study for recurrent chest pain in August 2017 which again remained normal.  Due to recurrent chest pain symptomatology a repeat nuclear study done in April 2019 again showed normal perfusion without scar or  ischemia.  She has a history of hypertension.  Her blood pressure today on recheck by me was excellent at 122/80 on a regimen consisting of lisinopril 40 mg daily and metoprolol 50 mg  in the morning and 25 mg at night.  She is on atorvastatin 40 mg for hyperlipidemia.  I reviewed recent laboratory from February 18, 2019 which showed cholesterol 154, triglycerides 64, LDL 94 and HDL 47.  I am recommending addition of Zetia 10 mg to her regimen for more optimal LDL lowering with target LDL in the 60s or below.  She received a new ResMed air sense 10 CPAP machine.  I reviewed her download in detail with her today.  She is meeting compliance however she is only sleeping 5 hours and 53 minutes on average per night.  I have recommended increased sleep duration particularly keeping CPAP on for the entirety of the night.  Her AHI was excellent at 0.9 with her 19th percentile pressure at 12.3.  She continues to be on pantoprazole for GERD which is controlled.  She has moderate obesity with a BMI of 34.7.  Weight loss and exercise was recommended.  In 3 months she will undergo repeat chemistry profile lipid studies with the addition of Zetia to her regimen.  I will see her in 6 months for reevaluation.  Time spent: 25 minutes Troy Sine, MD, [facility]  04/10/2019 12:48 PM

## 2019-04-10 ENCOUNTER — Encounter: Payer: Self-pay | Admitting: Cardiovascular Disease

## 2019-04-18 ENCOUNTER — Telehealth: Payer: Self-pay | Admitting: Cardiovascular Disease

## 2019-04-18 NOTE — Telephone Encounter (Signed)
Do you have any openings for LIPID clinic that I can place this patient, she seems to have tried other medications and now having issues, I tried to look at the scheduled for did not see any opening- will route to [facility].

## 2019-04-18 NOTE — Telephone Encounter (Signed)
Pt c/o medication issue:  1. Name of Medication: ezetimibe (ZETIA) 10 MG tablet  2. How are you currently taking this medication (dosage and times per day)? As directed  3. Are you having a reaction (difficulty breathing--STAT)? no  4. What is your medication issue? Patient states that ever since she started taking this medication her bladder or kidneys are hurting so much that she can barely walk. Want's to know if she needs to come in to have this situation resolved. Please advise.

## 2019-04-18 NOTE — Telephone Encounter (Signed)
If patient is willing to come to [address] office, you may place her on the [address] schedule

## 2019-04-18 NOTE — Telephone Encounter (Signed)
Patient called- she states that she has had back pain and not sure where it is coming from- I did advise patient to contact her PCP to see if they could see her and do urine workup to see if she has an infection and if they believe it to come from medication she was recently started on- patient will call PCP and then will call us back to make appointment with LIPID if needed.

## 2019-08-26 MED ORDER — LISINOPRIL 40 MG PO TABS: 40.0000 mg | ORAL_TABLET | Freq: Every day | ORAL | 2 refills | Status: DC

## 2019-09-25 ENCOUNTER — Telehealth (INDEPENDENT_AMBULATORY_CARE_PROVIDER_SITE_OTHER): Payer: Medicaid Other | Admitting: Cardiovascular Disease

## 2019-09-25 ENCOUNTER — Telehealth: Payer: Self-pay | Admitting: Physician Assistant

## 2019-09-25 ENCOUNTER — Encounter: Payer: Self-pay | Admitting: Cardiovascular Disease

## 2019-09-25 ENCOUNTER — Telehealth: Payer: Self-pay

## 2019-09-25 DIAGNOSIS — Z6835 Body mass index (BMI) 35.0-35.9, adult: Secondary | ICD-10-CM

## 2019-09-25 DIAGNOSIS — I10 Essential (primary) hypertension: Secondary | ICD-10-CM

## 2019-09-25 DIAGNOSIS — G4733 Obstructive sleep apnea (adult) (pediatric): Secondary | ICD-10-CM

## 2019-09-25 DIAGNOSIS — E785 Hyperlipidemia, unspecified: Secondary | ICD-10-CM

## 2019-09-25 DIAGNOSIS — E668 Other obesity: Secondary | ICD-10-CM

## 2019-09-25 DIAGNOSIS — I2510 Atherosclerotic heart disease of native coronary artery without angina pectoris: Secondary | ICD-10-CM

## 2019-09-25 DIAGNOSIS — Z951 Presence of aortocoronary bypass graft: Secondary | ICD-10-CM

## 2019-09-25 MED ORDER — LISINOPRIL 40 MG PO TABS: 40.0000 mg | ORAL_TABLET | Freq: Every day | ORAL | 2 refills | Status: DC

## 2019-09-25 MED ORDER — NITROGLYCERIN 0.4 MG SL SUBL: 0.4000 mg | SUBLINGUAL_TABLET | SUBLINGUAL | 3 refills | Status: DC | PRN

## 2019-09-25 MED ORDER — METOPROLOL TARTRATE 25 MG PO TABS: ORAL_TABLET | ORAL | 10 refills | Status: DC

## 2019-09-25 MED ORDER — ATORVASTATIN CALCIUM 40 MG PO TABS: 40.0000 mg | ORAL_TABLET | Freq: Every day | ORAL | 3 refills | Status: DC

## 2019-09-25 MED ORDER — EZETIMIBE 10 MG PO TABS: 10.0000 mg | ORAL_TABLET | Freq: Every day | ORAL | 3 refills | Status: DC

## 2019-09-25 NOTE — Patient Instructions (Addendum)
Medication Instructions:  CONTINUE WITH CURRENT MEDICATIONS. NO CHANGES.  *If you need a refill on your cardiac medications before your next appointment, please call your pharmacy*   Lab Work: FASTING LABS: CMET CBC TSH LIPIDS  If you have labs (blood work) drawn today and your tests are completely normal, you will receive your results only by: Marland Kitchen MyChart Message (if you have MyChart) OR . A paper copy in the mail If you have any lab test that is abnormal or we need to change your treatment, we will call you to review the results.    Follow-Up: At [facility], you and your health needs are our priority.  As part of our continuing mission to provide you with exceptional heart care, we have created designated Provider Care Teams.  These Care Teams include your primary Cardiologist (physician) and Advanced Practice Providers (APPs -  Physician Assistants and Nurse Practitioners) who all work together to provide you with the care you need, when you need it.  We recommend signing up for the patient portal called "MyChart".  Sign up information is provided on this After Visit Summary.  MyChart is used to connect with patients for Virtual Visits (Telemedicine).  Patients are able to view lab/test results, encounter notes, upcoming appointments, etc.  Non-urgent messages can be sent to your provider as well.   To learn more about what you can do with MyChart, go to [email or web address].    Your next appointment:   6 MONTHS  The format for your next appointment:   In Person  Provider:   Shelva Majestic, MD

## 2019-09-25 NOTE — Telephone Encounter (Signed)
Note opened in error. Judieth Mckown PA-C

## 2019-09-25 NOTE — Progress Notes (Signed)
Virtual Visit via Video Note   This visit type was conducted due to national recommendations for restrictions regarding the COVID-19 Pandemic (e.g. social distancing) in an effort to limit this patient's exposure and mitigate transmission in our community.  Due to her co-morbid illnesses, this patient is at least at moderate risk for complications without adequate follow up.  This format is felt to be most appropriate for this patient at this time.  All issues noted in this document were discussed and addressed.  A limited physical exam was performed with this format.  Please refer to the patient's chart for her consent to telehealth for [facility].   The patient was identified using 2 identifiers.  Date:  09/25/2019   ID:  Amber Wong, DOB Apr 21, 1956, MRN [id number]  Patient Location: Home Provider Location: Office  PCP:  Everardo Beals, NP  Cardiologist:  Shelva Majestic, MD  Electrophysiologist:  None   Evaluation Performed:  Follow-Up Visit  Chief Complaint: 99-monthevaluation.  History of Present Illness:    WLilya Smithermanis a 64y.o. female who has  a history of morbid obesity, hypertension, hyperlipidemia, and strong family history for CAD.  [facility] with chest pain described as a "elephant "sitting on her chest.  I performed cardiac catheterization on 10/16/2013 and she was found to have hyperdynamic LV function and evidence for severe 80% distal left main stenosis with 50% diffuse proximal LAD stenosis and otherwise normal circumflex and RCA vessels.  She underwent CABG x3 revascularization surgery by Dr. PMyles Lippsand had a LIMA placed to her LAD, SVG to obtuse marginal, and SVG to her diagonal vessel with Endo vascular vein harvesting from her right thigh.   She was seen by BTarri Fullerin October 2015 with complaints involving her left leg, knee, and great toe.  When I saw her in February 2016 she noted  marked improvement in her previous chest pain symptomatology.  She has a history of hypertension and has been on Lopressor 25 g twice a day and lisinopril 10 mg daily.  She is on how you Purinol for gout.  She has GERD which is been controlled with Protonix 40 mg daily.  She has been on Lipitor 20 mg for hyperlipidemia.  When I  saw her in 2016 she had begun to notice left-sided chest and arm discomfort which may have had some similar characteristics than her chest pain prior to surgery. She underwent a nuclear perfusion study on 03/25/2015 which revealed normal perfusion.  Ejection fraction was 67%.  Her chest pain  resolved.    She saw KNell Rangein June 2017 and because of recurrent somewhat atypical chest pain.  She underwent a repeat nuclear stress test which was done on 01/12/2016 and remained normal.  She has a history of asthma and is was started on Advair.  She also has a history of obstructive sleep apnea and [employer] as her DME company.  Her last CPAP machine was in 2014.    When I saw her in May 2018, blood pressure was improved with titration of her lisinopril.  HEENT in the emergency room in June 2018 with left upper chest and shoulder pain which was felt to be musculoskeletal rather than cardiac in etiology.  Her ECG was on change.  In August 2018.  An echo Doppler study showed an EF of 55-60%.  Grade 1 diastolic dysfunction.  There ascending aortic diameter was 39 mm.  PA  pressure was 25 mmHg.    She was seen by Almyra Deforest, PA in March 2019 and at that time had complaints of intermittent chest discomfort for several months.  Her chest pain had atypical features.  She was referred for nuclear perfusion study which was done on October 02, 2017 and was low risk which showed normal perfusion and function.  EF was 63%.  He had repeat laboratory which showed a cholesterol of 170, triglycerides 86, HDL 56, LDL 97.  Last saw her she was not using CPAP consistently.  I strongly  reiterated the importance of continued treatment.  She now admits that she is using CPAP in excess of 90% of the night.  Her sleeping has improved.    I  saw her in September 2019 which time she was using CPAP in excess of 90% of the nights and her sleeping was markedly improved. Her DME company was [employer] and they have been brought out by [employer].Whenlast saw her, her machine was still functioning. She subsequently was seen virtuallyby HaoMeng, PACin May 2020at which time her machine was broken.  I saw her for telemedicine evaluation on February 10, 2019.  She was  in need of a new machine and met criteria for a new wireless machine. She admits to fatigue and at times have noticed some episodes of vertigo. She apparently was found to be hypokalemic. She has noticed blood pressure lability. She has continued to be on lisinopril 40 mg daily, metoprolol 50 mg in the morning and 25 mg at night.   I last saw her in October 2020.  She was able to obtain a new ResMed air sense 10 CPAP machine.  This was obtained through [employer] as her DME company since she had previously used [facility].  She has felt well with her new CPAP machine.  She typically goes to bed between 10 and 11 PM and wakes up at 5 AM.  She is sleeping well.  A download was obtained from September 26 through April 06, 2019.  She is meeting compliance standards.  Usage days was 90%.  Average usage was 5 hours and 53 minutes.  She has an auto pressure minimum of 10 with a maximum of 20.  AHI is excellent at 0.9.  Her 95th percentile average pressure is 12.3 with a maximum average pressure 13.2.  There are days of increased mask leak.  During that evaluation I reviewed laboratory from September 2020 which showed an LDL of 94.  I recommended the addition of Zetia 10 mg to her atorvastatin regimen.  Over the past 6 months, he states that she has felt well.  She denies any chest pain.  She has not been successful with  weight loss and actually has gained 10 pounds.  She denies any swelling.  She has been intermittently using her CPAP and states that she needs new supplies including tubing, filters, as well as a mask.  A download was obtained from March 15 through September 23, 2019.  Usage was only 33% of days with average usage at 6 hours and 18 minutes.  AHI was 0.8 and her pressure settings were 10 to 20 cm  with her 95th percentile pressure 13.8  with a maximum average pressure at 14.8 cm.    The patient does not have symptoms concerning for COVID-19 infection (fever, chills, cough, or new shortness of breath).    Past Medical History:  Diagnosis Date  . Asthma   . Depression   .  Hypercholesteremia   . Hypertension   . Morbid obesity ([place])   . Nocturia   . Obstructive sleep apnea (adult) (pediatric) 12/27/2012   AHI 17.7 baseline, titrated to 6 cm , AHi now 2.4 - compliance   . OSA (obstructive sleep apnea)    w/ hypoventilation  . Sleep apnea    Past Surgical History:  Procedure Laterality Date  . BREAST LUMPECTOMY Right    benign at age 12  . CORONARY ARTERY BYPASS GRAFT N/A 10/20/2013   Procedure: CORONARY ARTERY BYPASS GRAFTING (CABG);  Surgeon: Ivin Poot, MD;  Location: [place];  Service: Open Heart Surgery;  Laterality: N/A;  CABG x 3  . CYSTECTOMY    . INTRAOPERATIVE TRANSESOPHAGEAL ECHOCARDIOGRAM N/A 10/20/2013   Procedure: INTRAOPERATIVE TRANSESOPHAGEAL ECHOCARDIOGRAM;  Surgeon: Ivin Poot, MD;  Location: [place];  Service: Open Heart Surgery;  Laterality: N/A;  . KNEE SURGERY     chronic pain and limping since  . LEFT HEART CATHETERIZATION WITH CORONARY ANGIOGRAM N/A 10/16/2013   Procedure: LEFT HEART CATHETERIZATION WITH CORONARY ANGIOGRAM;  Surgeon: Troy Sine, MD;  Location: [facility] CATH LAB;  Service: Cardiovascular;  Laterality: N/A;  . PARTIAL HYSTERECTOMY    . TONSILLECTOMY       Current Meds  Medication Sig  . acyclovir (ZOVIRAX) 200 MG capsule Take 200 mg by mouth every  morning.  Marland Kitchen allopurinol (ZYLOPRIM) 100 MG tablet Take 100 mg by mouth 3 (three) times daily.  Marland Kitchen aspirin 81 MG tablet Take 81 mg by mouth daily.   Marland Kitchen atorvastatin (LIPITOR) 40 MG tablet Take 1 tablet (40 mg total) by mouth daily.  Marland Kitchen buPROPion (WELLBUTRIN) 100 MG tablet Take 100 mg by mouth 2 (two) times daily.  . cholecalciferol (VITAMIN D) 1000 units tablet Take 1,000 Units by mouth daily.  . cromolyn (OPTICROM) 4 % ophthalmic solution Place 1 drop into both eyes 4 (four) times daily.  . fluconazole (DIFLUCAN) 150 MG tablet Take 1 tablet by mouth daily as needed. When yeast infection is present  . Fluticasone-Salmeterol (ADVAIR) 250-50 MCG/DOSE AEPB Inhale 1 puff into the lungs 2 (two) times daily.  Marland Kitchen lisinopril (ZESTRIL) 40 MG tablet Take 1 tablet (40 mg total) by mouth daily.  . meclizine (ANTIVERT) 25 MG tablet meclizine 25 mg tablet  Take 1 tablet 3 times a day by oral route as needed for 10 days.  . meloxicam (MOBIC) 15 MG tablet Take 15 mg by mouth daily.  . metoprolol tartrate (LOPRESSOR) 25 MG tablet TAKE 2 TABLETS IN THE MORNING AND 1 TABLET AT NIGHT  . nitroGLYCERIN (NITROSTAT) 0.4 MG SL tablet Place 1 tablet (0.4 mg total) under the tongue every 5 (five) minutes as needed for chest pain.  . pantoprazole (PROTONIX) 40 MG tablet Take 1 tablet (40 mg total) by mouth 2 (two) times daily.  Marland Kitchen PARoxetine (PAXIL) 20 MG tablet Take 20 mg by mouth every morning.  . potassium chloride SA (K-DUR) 20 MEQ tablet Take 1 tablet (20 mEq total) by mouth 2 (two) times daily.  Marland Kitchen triamcinolone cream (KENALOG) 0.1 % Apply 1 application topically 2 (two) times daily.     Allergies:   Iohexol, Penicillins, and Shellfish allergy   Social History   Tobacco Use  . Smoking status: Former Smoker    Years: 15.00    Types: Cigarettes  . Smokeless tobacco: Never Used  . Tobacco comment: Quit 2005  Substance Use Topics  . Alcohol use: No  . Drug use: No  Family Hx: The patient's family history  includes Cancer in her sister; Heart attack in her sister; Heart attack (age of onset: 61) in her mother; Heart attack (age of onset: 35) in her father.  ROS:   Please see the history of present illness.    No fevers chills night sweats No cough No changes in vision or hearing Stable heart rate Positive for weight gain No edema Positive for history of gout Intermittently using CPAP therapy All other systems reviewed and are negative.   Prior CV studies:   The following studies were reviewed today:  A new CPAP download was obtained which reveals reduced compliance as noted above.  AHI, however, was excellent at 0.8/h.  Labs/Other Tests and Data Reviewed:    EKG: I personally reviewed the ECG from April 08, 2019 which showed normal sinus rhythm at 65 bpm, nonspecific T wave abnormality.  Intervals were normal.  There was no ectopy.  Recent Labs: 02/18/2019: ALT 18; BUN 11; Creatinine, Ser 0.87; Hemoglobin 14.9; Platelets 221; Potassium 4.0; Sodium 142; TSH 0.800   Recent Lipid Panel Lab Results  Component Value Date/Time   CHOL 154 02/18/2019 10:02 AM   TRIG 64 02/18/2019 10:02 AM   HDL 47 02/18/2019 10:02 AM   CHOLHDL 3.3 02/18/2019 10:02 AM   CHOLHDL 2.8 07/07/2016 03:01 PM   LDLCALC 94 02/18/2019 10:02 AM    Wt Readings from Last 3 Encounters:  09/25/19 235 lb (106.6 kg)  04/08/19 225 lb 6.4 oz (102.2 kg)  02/10/19 235 lb (106.6 kg)     Objective:    Vital Signs:  Ht 5' 8"  (1.727 m)   Wt 235 lb (106.6 kg)   BMI 35.73 kg/m   She did not have the capability at home to take her blood pressure.  Since this was a video virtual visit I could not physically examine the patient.  However visually she appeared unchanged. She had a thick neck.  There was no obvious JVD. Breathing was normal and not labored.  I did not hear any audible wheezing When she palpated her pulse heart rate was regular She denied any swelling She denied any rashes  ASSESSMENT & PLAN:     1. CAD: She was found to have severe multivessel disease and underwent CABG surgery x3 in May 2015 with a LIMA to LAD, SVG to OM, and SVG to diagonal vessel.  RCA was not placed.  Her last nuclear study showed normal perfusion.  At present she is not having current anginal symptomatology. 2. Essential hypertension: She is now on lisinopril 40 mg daily and metoprolol tartrate 25 mg twice a day.  She did not have a blood pressure cuff to check her blood pressure at home.  She will continue current therapy. 3. Obstructive sleep apnea: She has a new ResMed air sense 10 machine.  Her most recent download reveals significantly reduced compliance.  I again had a long discussion with her regarding the importance of continued therapy.  She states she is in need for new supplies and has not had any new supplies since [employer] purchased [place].  We will place an order to [facility] for her new supplies including changing her mask style to a ResMed air fit F 30i. 4. Hyperlipidemia: She is now on atorvastatin and Zetia 10 mg daily.  Will need repeat laboratory. 5. Morbid obese: BMI 35.73 today.  10 pound weight gain since her last evaluation.  I discussed the importance of weight loss and exercise both  with reference to her cardiovascular health as well as improvement in her sleep apnea.  COVID-19 Education: The signs and symptoms of COVID-19 were discussed with the patient and how to seek care for testing (follow up with PCP or arrange E-visit). The importance of social distancing was discussed today.  Time:   Today, I have spent 31 minutes with the patient with telehealth technology discussing the above problems.     Medication Adjustments/Labs and Tests Ordered: Current medicines are reviewed at length with the patient today.  Concerns regarding medicines are outlined above.   Tests Ordered: No orders of the defined types were placed in this encounter.   Medication Changes: No orders of the  defined types were placed in this encounter.   Follow Up: In person visit in 6 months.  Signed, Shelva Majestic, MD  09/25/2019 2:19 PM    [facility]

## 2019-09-25 NOTE — Telephone Encounter (Signed)
Reviewed AVS from VV with Dr.Kelly.  Pt verbalized understanding with no other questions or concerns. Will mail AVS and lab slips

## 2019-09-25 NOTE — Telephone Encounter (Deleted)
Patient Consent for Virtual Visit   { TIP  Anything in RED or BLUE will delete when you sign your note. There is no need to delete it or select it by pressing F2.   Please read everything in BLUE to the patient.        :[id number] { PLEASE READ THE FOLLOWING TO THE PATIENT. Then, scroll to the question in red below. If the patient has questions about consent, refer to and read the consent below,    CONSENT FOR VIRTUAL VISIT.  Amber Wong, you are scheduled for a virtual visit with your provider today.  Just as we do with appointments in the office, we must obtain your consent to participate.  Your consent will be active for this visit and any virtual visit you may have with one of our providers in the next 365 days.  If you have a MyChart account, I can also send a copy of this consent to you electronically.  All virtual visits are billed to your insurance company just like a traditional visit in the office.  As this is a virtual visit, video technology does not allow for your provider to perform a traditional examination.  This may limit your provider's ability to fully assess your condition.  If your provider identifies any concerns that need to be evaluated in person or the need to arrange testing such as labs, EKG, etc, we will make arrangements to do so.  Although advances in technology are sophisticated, we cannot ensure that it will always work on either your end or our end.  If the connection with a video visit is poor, we may have to switch to a telephone visit.  With either a video or telephone visit, we are not always able to ensure that we have a secure connection.   I need to obtain your verbal consent now.   Are you willing to proceed with your visit today?        :[id number]  {  Did the patient verbally consent to the visit?  Place your cursor on the next line. Then, press F2 or Fn plus F2 to select the list below.  Then, choose YES or NO.  :[id number]   {Click here.   Press F2 and choose YES or NO                  :[id number]   CONSENT FOR VIRTUAL VISIT FOR:  Amber Wong  By participating in this virtual visit I agree to the following:  I hereby voluntarily request, consent and authorize [facility] and its employed or contracted physicians, physician assistants, nurse practitioners or other licensed health care professionals (the Practitioner), to provide me with telemedicine health care services (the "Services") as deemed necessary by the treating Practitioner. I acknowledge and consent to receive the Services by the Practitioner via telemedicine. I understand that the telemedicine visit will involve communicating with the Practitioner through live audiovisual communication technology and the disclosure of certain medical information by electronic transmission. I acknowledge that I have been given the opportunity to request an in-person assessment or other available alternative prior to the telemedicine visit and am voluntarily participating in the telemedicine visit.  I understand that I have the right to withhold or withdraw my consent to the use of telemedicine in the course of my care at any time, without affecting my right to future care or treatment, and that the Practitioner or I may terminate the telemedicine visit at any time. I understand  that I have the right to inspect all information obtained and/or recorded in the course of the telemedicine visit and may receive copies of available information for a reasonable fee.  I understand that some of the potential risks of receiving the Services via telemedicine include:  Marland Kitchen Delay or interruption in medical evaluation due to technological equipment failure or disruption; . Information transmitted may not be sufficient (e.g. poor resolution of images) to allow for appropriate medical decision making by the Practitioner; and/or  . In rare instances, security protocols could fail, causing a breach of  personal health information.  Furthermore, I acknowledge that it is my responsibility to provide information about my medical history, conditions and care that is complete and accurate to the best of my ability. I acknowledge that Practitioner's advice, recommendations, and/or decision may be based on factors not within their control, such as incomplete or inaccurate data provided by me or distortions of diagnostic images or specimens that may result from electronic transmissions. I understand that the practice of medicine is not an exact science and that Practitioner makes no warranties or guarantees regarding treatment outcomes. I acknowledge that a copy of this consent can be made available to me via my patient portal ([facility]), or I can request a printed copy by calling the office of [facility].    I understand that my insurance will be billed for this visit.   I have read or had this consent read to me. . I understand the contents of this consent, which adequately explains the benefits and risks of the Services being provided via telemedicine.  . I have been provided ample opportunity to ask questions regarding this consent and the Services and have had my questions answered to my satisfaction. . I give my informed consent for the services to be provided through the use of telemedicine in my medical care

## 2019-09-26 ENCOUNTER — Telehealth: Payer: Self-pay | Admitting: *Deleted

## 2019-09-26 DIAGNOSIS — G4733 Obstructive sleep apnea (adult) (pediatric): Secondary | ICD-10-CM

## 2019-09-26 NOTE — Telephone Encounter (Signed)
-----   Message from Darlyn Chamber June, RN sent at 09/25/2019  3:11 PM EDT ----- Pt uses [employer] for CPAP. Pt needs supplies: filters, chamber, tubing, and mask (F30i) Thank you

## 2019-09-26 NOTE — Telephone Encounter (Signed)
Order placed to [facility] via community message for dr Claiborne Billings

## 2019-10-01 ENCOUNTER — Telehealth: Payer: Self-pay | Admitting: *Deleted

## 2019-10-01 ENCOUNTER — Other Ambulatory Visit: Payer: Self-pay | Admitting: Cardiovascular Disease

## 2019-10-01 DIAGNOSIS — G4733 Obstructive sleep apnea (adult) (pediatric): Secondary | ICD-10-CM

## 2019-10-01 NOTE — Telephone Encounter (Signed)
Order placed and message sent to Jonn Shingles with [employer] patient needs CPAP supplies.

## 2019-11-24 ENCOUNTER — Telehealth: Payer: Self-pay

## 2019-11-24 NOTE — Telephone Encounter (Signed)
Orders for cpap supplies faxed to [facility] on 10/30/19

## 2019-12-05 ENCOUNTER — Other Ambulatory Visit: Payer: Self-pay | Admitting: *Deleted

## 2019-12-05 DIAGNOSIS — Z1231 Encounter for screening mammogram for malignant neoplasm of breast: Secondary | ICD-10-CM

## 2019-12-26 ENCOUNTER — Other Ambulatory Visit: Payer: Self-pay | Admitting: *Deleted

## 2019-12-26 DIAGNOSIS — N63 Unspecified lump in unspecified breast: Secondary | ICD-10-CM

## 2019-12-27 LAB — TSH: TSH: 1.18 u[IU]/mL (ref 0.450–4.500)

## 2019-12-27 LAB — COMPREHENSIVE METABOLIC PANEL
ALT: 28 IU/L (ref 0–32)
AST: 30 IU/L (ref 0–40)
Albumin/Globulin Ratio: 1.4 (ref 1.2–2.2)
Albumin: 4.1 g/dL (ref 3.8–4.8)
Alkaline Phosphatase: 196 IU/L — ABNORMAL HIGH (ref 48–121)
BUN/Creatinine Ratio: 10 — ABNORMAL LOW (ref 12–28)
BUN: 8 mg/dL (ref 8–27)
Bilirubin Total: 0.5 mg/dL (ref 0.0–1.2)
CO2: 26 mmol/L (ref 20–29)
Calcium: 9.4 mg/dL (ref 8.7–10.3)
Chloride: 106 mmol/L (ref 96–106)
Creatinine, Ser: 0.79 mg/dL (ref 0.57–1.00)
GFR calc Af Amer: 92 mL/min/{1.73_m2} (ref >59)
GFR calc non Af Amer: 80 mL/min/{1.73_m2} (ref >59)
Globulin, Total: 3 g/dL (ref 1.5–4.5)
Glucose: 69 mg/dL (ref 65–99)
Potassium: 3.9 mmol/L (ref 3.5–5.2)
Sodium: 146 mmol/L — ABNORMAL HIGH (ref 134–144)
Total Protein: 7.1 g/dL (ref 6.0–8.5)

## 2019-12-27 LAB — CBC
Hematocrit: 44.5 % (ref 34.0–46.6)
Hemoglobin: 15.1 g/dL (ref 11.1–15.9)
MCH: 29.7 pg (ref 26.6–33.0)
MCHC: 33.9 g/dL (ref 31.5–35.7)
MCV: 87 fL (ref 79–97)
Platelets: 240 10*3/uL (ref 150–450)
RBC: 5.09 x10E6/uL (ref 3.77–5.28)
RDW: 13.6 % (ref 11.7–15.4)
WBC: 6.5 10*3/uL (ref 3.4–10.8)

## 2019-12-27 LAB — LIPID PANEL
Chol/HDL Ratio: 2.7 ratio (ref 0.0–4.4)
Cholesterol, Total: 125 mg/dL (ref 100–199)
HDL: 47 mg/dL (ref >39)
LDL Chol Calc (NIH): 64 mg/dL (ref 0–99)
Triglycerides: 66 mg/dL (ref 0–149)
VLDL Cholesterol Cal: 14 mg/dL (ref 5–40)

## 2019-12-29 ENCOUNTER — Telehealth: Payer: Self-pay | Admitting: Cardiovascular Disease

## 2019-12-29 ENCOUNTER — Other Ambulatory Visit: Payer: Self-pay | Admitting: Physician Assistant

## 2019-12-29 NOTE — Telephone Encounter (Signed)
° ° °  [facility] called wanted to make sure we received lab work done 12/26/19 for lipid, TSH, CBC and comprehensive metabolic panel. She wanted to make sure that Dr. Claiborne Billings sees it

## 2020-01-07 ENCOUNTER — Ambulatory Visit
Admission: RE | Admit: 2020-01-07 | Discharge: 2020-01-07 | Disposition: A | Discharge status: Home / Self Care | Payer: Medicaid Other | Source: Ambulatory Visit | Attending: *Deleted | Admitting: *Deleted

## 2020-01-07 ENCOUNTER — Other Ambulatory Visit: Payer: Self-pay

## 2020-01-07 DIAGNOSIS — N63 Unspecified lump in unspecified breast: Secondary | ICD-10-CM

## 2020-01-15 ENCOUNTER — Telehealth: Payer: Self-pay

## 2020-01-15 NOTE — Telephone Encounter (Signed)
Message routed to Triage in Pt Appt Request: FYI   Message text   Amber Wong, Amber Wong 2 hours ago (11:00 AM)   Thank you. That's fine. So what do I do about these chest pains that I am having?  Continue taking nitro glycerin?  Sometimes , they feel like a pin. Yesterday I had one in the middle of my chest. It was sort of like the first one in 2015. But not as strong. Could it be because of stress?  Or maybe the COPD. I do a lot of sweating in my head. My stomach stays nauseous. Could it be because of menopause? Week end before last I went to the market for the first time in a yr. My whole left side , maybe my nerves. I felt so much pain as if it was burning.  I have a lot of headaches. Maybe because I fell last yesr. I forget a lot.  I know that the more a person is in pain , the higher their blood pressure is. Should I talk to Dr. Yisroel Ramming about these things? Thank you all so much for your caring.  Now as I use the CPAP machine. I wake up belching a lot. I throw up a little as well. If I eat  overeat . So maybe I should cut back on the amount of food that I eat. Or eat once a day do you think. ? Only by the grace of God , I'm still here. Have a great day. Thank you.     Called and spoke with pt she reports that she feels these chest pains a lot. She states she knows there are a lot of stressors in the world right now. She also reports that she has been eating differently.  Pt states when she has had these chest pains she will sometimes take nitro and this will help.  Pt denies any current chest pain at this time. She states she sleeps under the A/C and she had bronchitis and she is on asthma medications but she is unsure if this interacts with the medications she takes for [facility] but she has had a dry cough.  She she states she went to the store for the first time in almost a year and while there had and issue with her Left leg.  She states that with her COPD sometimes it is  very hard to breathe. She also reports she had had a lot of sweating. Notified she should speak with Dr.Millsap regarding the sweating. She is also having some issues with her CPAP machine. Notified we could print out a download and make adjustments if needed.  Notified that our soonest appt is on 02/04/20 at 3:15pm with Almyra Deforest PA. Pt agreeable with this appt. Notified that if she begins to have chest pain again we advise she go to the Emergency department to be evaluated. Pt agreeable and verbalized understanding. No other questions at this time.

## 2020-01-17 ENCOUNTER — Encounter: Payer: Self-pay | Admitting: Cardiovascular Disease

## 2020-01-19 NOTE — Telephone Encounter (Signed)
CPAP download is excellent with AHI 1.1 and 95th percentile pressure 12.4 years of water.  She is meeting compliance standards with usage duration is 6 hours and 33 minutes.

## 2020-01-20 ENCOUNTER — Ambulatory Visit: Payer: Self-pay

## 2020-01-21 NOTE — Telephone Encounter (Signed)
Called and spoke with pt, notified of Dr.Kelly's recommendation and review of her CPAP download. Notified she should keep her follow up visit with Isaac Laud on the 25th. Pt stated she had that pain in her leg and weakness and she was concerned for stroke. Notified that we look for one sided weakness, facial weakness, slurred speech. Pt verbalized understanding. She stated that she will always have problems with her left leg because she had cartilage taken out. Pt also stated she had some chest pain last night but took some nitro and this helped. She stated she was concerned for a heart attack with some of the pain she had been having. Pt denied active chest pain currently. Notified that again, with chest pain, we recommend she go to the ED to be evaluated. Advised that if she is concerned with these symptoms that she should also go to the Emergency department. Pt stated she would.  No other questions at this time. Notified if she needed anything else to let us know.

## 2020-02-04 ENCOUNTER — Ambulatory Visit (INDEPENDENT_AMBULATORY_CARE_PROVIDER_SITE_OTHER): Payer: Medicaid Other | Admitting: Physician Assistant

## 2020-02-04 ENCOUNTER — Other Ambulatory Visit: Payer: Self-pay

## 2020-02-04 VITALS — BP 146/88 | HR 61 | Wt 229.8 lb

## 2020-02-04 DIAGNOSIS — I2510 Atherosclerotic heart disease of native coronary artery without angina pectoris: Secondary | ICD-10-CM

## 2020-02-04 DIAGNOSIS — G4733 Obstructive sleep apnea (adult) (pediatric): Secondary | ICD-10-CM

## 2020-02-04 DIAGNOSIS — E785 Hyperlipidemia, unspecified: Secondary | ICD-10-CM

## 2020-02-04 DIAGNOSIS — I10 Essential (primary) hypertension: Secondary | ICD-10-CM

## 2020-02-04 NOTE — Progress Notes (Signed)
Cardiology Office Note:    Date:  02/06/2020   ID:  Amber Wong, DOB 02-03-1956, MRN [id number]  PCP:  Everardo Beals, NP  [facility] Cardiologist:  Shelva Majestic, MD  [place] Electrophysiologist:  None   Referring MD: Everardo Beals, NP   Chief Complaint  Patient presents with  . Follow-up    seen for Dr. Claiborne Billings.    History of Present Illness:    Amber Wong is a 64 y.o. 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 Myoview for atypical chest pain in August 2017 was normal.  Echocardiogram in August 2018 showed EF 55 to 60%, grade 1 DD, ascending aorta measuring 39 mm, PA peak pressure 25 mmHg.  Myoview obtained in April 2019 was low risk with EF of 63%.  Patient was last seen virtually by Dr. Claiborne Billings on 09/25/2019 at which time she was compliant with her CPAP therapy.  Patient presents today for cardiology office visit for evaluation of leg pain and weakness.  She also has intermittent chest discomfort as well.  She called our triage nurse on 01/15/2020 who instructed her to go to the ED, she eventually decided not to go.  Her chest pain is fairly atypical and it does not seems to occur with physical activity.  She has not had any recurrent chest pain in the past few days.  Both of previous episodes occurred at rest.  Her EKG does not show any obvious ischemic changes.  I recommended continue observation for the time being.  I will bring the patient back in 4 weeks for reassessment.   Past Medical History:  Diagnosis Date  . Asthma   . Depression   . Hypercholesteremia   . Hypertension   . Morbid obesity ([place])   . Nocturia   . Obstructive sleep apnea (adult) (pediatric) 12/27/2012   AHI 17.7 baseline, titrated to 6 cm , AHi now 2.4 - compliance   . OSA (obstructive sleep apnea)    w/ hypoventilation  . Sleep apnea     Past Surgical History:    Procedure Laterality Date  . BREAST EXCISIONAL BIOPSY Right   . CORONARY ARTERY BYPASS GRAFT N/A 10/20/2013   Procedure: CORONARY ARTERY BYPASS GRAFTING (CABG);  Surgeon: Ivin Poot, MD;  Location: [place];  Service: Open Heart Surgery;  Laterality: N/A;  CABG x 3  . CYSTECTOMY    . INTRAOPERATIVE TRANSESOPHAGEAL ECHOCARDIOGRAM N/A 10/20/2013   Procedure: INTRAOPERATIVE TRANSESOPHAGEAL ECHOCARDIOGRAM;  Surgeon: Ivin Poot, MD;  Location: [place];  Service: Open Heart Surgery;  Laterality: N/A;  . KNEE SURGERY     chronic pain and limping since  . LEFT HEART CATHETERIZATION WITH CORONARY ANGIOGRAM N/A 10/16/2013   Procedure: LEFT HEART CATHETERIZATION WITH CORONARY ANGIOGRAM;  Surgeon: Troy Sine, MD;  Location: [facility] CATH LAB;  Service: Cardiovascular;  Laterality: N/A;  . PARTIAL HYSTERECTOMY    . TONSILLECTOMY      Current Medications: Current Meds  Medication Sig  . acyclovir (ZOVIRAX) 200 MG capsule Take 200 mg by mouth every morning.  Marland Kitchen albuterol (VENTOLIN HFA) 108 (90 Base) MCG/ACT inhaler Inhale into the lungs every 6 (six) hours as needed for wheezing or shortness of breath.  Marland Kitchen albuterol (VENTOLIN HFA) 108 (90 Base) MCG/ACT inhaler Inhale into the lungs every 6 (six) hours as needed for wheezing or shortness of breath.  . allopurinol (  ZYLOPRIM) 100 MG tablet Take 100 mg by mouth 3 (three) times daily.  Marland Kitchen aspirin 81 MG tablet Take 81 mg by mouth daily.   Marland Kitchen atorvastatin (LIPITOR) 40 MG tablet Take 1 tablet (40 mg total) by mouth daily.  Marland Kitchen buPROPion (WELLBUTRIN) 100 MG tablet Take 100 mg by mouth 2 (two) times daily.  . cholecalciferol (VITAMIN D) 1000 units tablet Take 1,000 Units by mouth daily.  . cromolyn (OPTICROM) 4 % ophthalmic solution Place 1 drop into both eyes 4 (four) times daily.  . fluconazole (DIFLUCAN) 150 MG tablet Take 1 tablet by mouth daily as needed. When yeast infection is present  . lisinopril (ZESTRIL) 40 MG tablet Take 1 tablet (40 mg total) by mouth  daily.  . meclizine (ANTIVERT) 25 MG tablet meclizine 25 mg tablet  Take 1 tablet 3 times a day by oral route as needed for 10 days.  . meloxicam (MOBIC) 15 MG tablet Take 15 mg by mouth daily.  . metoprolol tartrate (LOPRESSOR) 25 MG tablet TAKE 2 TABLETS IN THE MORNING AND 1 TABLET AT NIGHT  . nitroGLYCERIN (NITROSTAT) 0.4 MG SL tablet PLACE 1 TABLET UNDER THE TONGUE EVERY 5 MINUTES AS NEEDED FOR CHEST PAIN.  Marland Kitchen pantoprazole (PROTONIX) 40 MG tablet Take 1 tablet (40 mg total) by mouth 2 (two) times daily.  Marland Kitchen PARoxetine (PAXIL) 20 MG tablet Take 20 mg by mouth every morning.  . triamcinolone cream (KENALOG) 0.1 % Apply 1 application topically 2 (two) times daily.     Allergies:   Iohexol, Penicillins, and Shellfish allergy   Social History   Socioeconomic History  . Marital status: Divorced    Spouse name: Not on file  . Number of children: Not on file  . Years of education: 86  . Highest education level: Not on file  Occupational History    Employer: UNEMPLOYED    Comment: history of shift work  Tobacco Use  . Smoking status: Former Smoker    Years: 15.00    Types: Cigarettes  . Smokeless tobacco: Never Used  . Tobacco comment: Quit 2005  Vaping Use  . Vaping Use: Never used  Substance and Sexual Activity  . Alcohol use: No  . Drug use: No  . Sexual activity: Never  Other Topics Concern  . Not on file  Social History Narrative   Patient lives at home alone.    Social Determinants of Health   Financial Resource Strain:   . Difficulty of Paying Living Expenses: Not on file  Food Insecurity:   . Worried About Charity fundraiser in the Last Year: Not on file  . Ran Out of Food in the Last Year: Not on file  Transportation Needs:   . Lack of Transportation (Medical): Not on file  . Lack of Transportation (Non-Medical): Not on file  Physical Activity:   . Days of Exercise per Week: Not on file  . Minutes of Exercise per Session: Not on file  Stress:   . Feeling of  Stress : Not on file  Social Connections:   . Frequency of Communication with Friends and Family: Not on file  . Frequency of Social Gatherings with Friends and Family: Not on file  . Attends Religious Services: Not on file  . Active Member of Clubs or Organizations: Not on file  . Attends Archivist Meetings: Not on file  . Marital Status: Not on file     Family History: The patient's family history includes Breast cancer in  her cousin and paternal aunt; Breast cancer (age of onset: 8) in her sister; Cancer in her sister; Heart attack in her sister; Heart attack (age of onset: 91) in her mother; Heart attack (age of onset: 69) in her father.  ROS:   Please see the history of present illness.     All other systems reviewed and are negative.  EKGs/Labs/Other Studies Reviewed:    The following studies were reviewed today:  Myoview 10/03/2017  The left ventricular ejection fraction is normal (55-65%).  Nuclear stress EF: 63%.  There was no ST segment deviation noted during stress.  The study is normal.  This is a low risk study.   Normal stress nuclear study with no ischemia or infarction; EF 63 with normal wall motion.   EKG:  EKG is ordered today.  The ekg ordered today demonstrates normal sinus rhythm without significant ST-T wave changes.  Heart rate 61 bpm  Recent Labs: 12/26/2019: ALT 28; BUN 8; Creatinine, Ser 0.79; Hemoglobin 15.1; Platelets 240; Potassium 3.9; Sodium 146; TSH 1.180  Recent Lipid Panel    Component Value Date/Time   CHOL 125 12/26/2019 1600   TRIG 66 12/26/2019 1600   HDL 47 12/26/2019 1600   CHOLHDL 2.7 12/26/2019 1600   CHOLHDL 2.8 07/07/2016 1501   VLDL 16 07/07/2016 1501   LDLCALC 64 12/26/2019 1600    Physical Exam:    VS:  BP (!) 146/88   Pulse 61   Wt 229 lb 12.8 oz (104.2 kg)   BMI 34.94 kg/m     Wt Readings from Last 3 Encounters:  02/04/20 229 lb 12.8 oz (104.2 kg)  09/25/19 235 lb (106.6 kg)  04/08/19 225 lb 6.4  oz (102.2 kg)     GEN:  Well nourished, well developed in no acute distress HEENT: Normal NECK: No JVD; No carotid bruits LYMPHATICS: No lymphadenopathy CARDIAC: RRR, no murmurs, rubs, gallops RESPIRATORY:  Clear to auscultation without rales, wheezing or rhonchi  ABDOMEN: Soft, non-tender, non-distended MUSCULOSKELETAL:  No edema; No deformity  SKIN: Warm and dry NEUROLOGIC:  Alert and oriented x 3 PSYCHIATRIC:  Normal affect   ASSESSMENT:    1. CAD in native artery   2. Essential hypertension   3. Hyperlipidemia with target LDL less than 70   4. OSA (obstructive sleep apnea)    PLAN:    In order of problems listed above:  1. CAD: There was 2 episodes of chest pain occurring at rest earlier this month.  Chest pain has not recurred again.  Symptom is somewhat atypical.  I recommend continue observation and reassess in 4 weeks.  2. Hypertension: Blood pressure stable  3. Hyperlipidemia: Continue Lipitor  4. Obstructive sleep apnea: On CPAP therapy.   Medication Adjustments/Labs and Tests Ordered: Current medicines are reviewed at length with the patient today.  Concerns regarding medicines are outlined above.  Orders Placed This Encounter  Procedures  . EKG 12-Lead   No orders of the defined types were placed in this encounter.   Patient Instructions  Medication Instructions:  No Changes In Medications at this time.    *If you need a refill on your cardiac medications before your next appointment, please call your pharmacy*   Lab Work: None Ordered At This Time.   If you have labs (blood work) drawn today and your tests are completely normal, you will receive your results only by: Marland Kitchen MyChart Message (if you have MyChart) OR . A paper copy in the mail If you have  any lab test that is abnormal or we need to change your treatment, we will call you to review the results.   Testing/Procedures: None Ordered At This Time.     Follow-Up: At [facility], you  and your health needs are our priority.  As part of our continuing mission to provide you with exceptional heart care, we have created designated Provider Care Teams.  These Care Teams include your primary Cardiologist (physician) and Advanced Practice Providers (APPs -  Physician Assistants and Nurse Practitioners) who all work together to provide you with the care you need, when you need it.  We recommend signing up for the patient portal called "MyChart".  Sign up information is provided on this After Visit Summary.  MyChart is used to connect with patients for Virtual Visits (Telemedicine).  Patients are able to view lab/test results, encounter notes, upcoming appointments, etc.  Non-urgent messages can be sent to your provider as well.   To learn more about what you can do with MyChart, go to [email or web address].    Your next appointment:   4 week(s)  The format for your next appointment:   In Person  Provider:   Almyra Deforest, PA-C   Other Instructions Please let us know if you have any recurrent chest pain.       Hilbert Corrigan, [place]  02/06/2020 11:54 PM    [facility]

## 2020-02-04 NOTE — Patient Instructions (Signed)
Medication Instructions:  No Changes In Medications at this time.    *If you need a refill on your cardiac medications before your next appointment, please call your pharmacy*   Lab Work: None Ordered At This Time.   If you have labs (blood work) drawn today and your tests are completely normal, you will receive your results only by: Marland Kitchen MyChart Message (if you have MyChart) OR . A paper copy in the mail If you have any lab test that is abnormal or we need to change your treatment, we will call you to review the results.   Testing/Procedures: None Ordered At This Time.     Follow-Up: At [facility], you and your health needs are our priority.  As part of our continuing mission to provide you with exceptional heart care, we have created designated Provider Care Teams.  These Care Teams include your primary Cardiologist (physician) and Advanced Practice Providers (APPs -  Physician Assistants and Nurse Practitioners) who all work together to provide you with the care you need, when you need it.  We recommend signing up for the patient portal called "MyChart".  Sign up information is provided on this After Visit Summary.  MyChart is used to connect with patients for Virtual Visits (Telemedicine).  Patients are able to view lab/test results, encounter notes, upcoming appointments, etc.  Non-urgent messages can be sent to your provider as well.   To learn more about what you can do with MyChart, go to [email or web address].    Your next appointment:   4 week(s)  The format for your next appointment:   In Person  Provider:   Almyra Deforest, PA-C   Other Instructions Please let us know if you have any recurrent chest pain.

## 2020-02-06 ENCOUNTER — Encounter: Payer: Self-pay | Admitting: Physician Assistant

## 2020-03-03 ENCOUNTER — Ambulatory Visit: Payer: Medicaid Other | Admitting: Physician Assistant

## 2020-03-03 ENCOUNTER — Other Ambulatory Visit: Payer: Self-pay

## 2020-03-03 ENCOUNTER — Encounter: Payer: Self-pay | Admitting: Physician Assistant

## 2020-03-03 VITALS — BP 136/78 | HR 58 | Ht 68.0 in | Wt 234.0 lb

## 2020-03-03 DIAGNOSIS — R0789 Other chest pain: Secondary | ICD-10-CM | POA: Diagnosis not present

## 2020-03-03 DIAGNOSIS — I10 Essential (primary) hypertension: Secondary | ICD-10-CM

## 2020-03-03 DIAGNOSIS — R109 Unspecified abdominal pain: Secondary | ICD-10-CM

## 2020-03-03 DIAGNOSIS — G4733 Obstructive sleep apnea (adult) (pediatric): Secondary | ICD-10-CM

## 2020-03-03 DIAGNOSIS — Z9989 Dependence on other enabling machines and devices: Secondary | ICD-10-CM

## 2020-03-03 DIAGNOSIS — I25709 Atherosclerosis of coronary artery bypass graft(s), unspecified, with unspecified angina pectoris: Secondary | ICD-10-CM

## 2020-03-03 DIAGNOSIS — E785 Hyperlipidemia, unspecified: Secondary | ICD-10-CM

## 2020-03-03 LAB — MICROSCOPIC EXAMINATION: Casts: NONE SEEN /lpf

## 2020-03-03 LAB — URINALYSIS, ROUTINE W REFLEX MICROSCOPIC
Bilirubin, UA: NEGATIVE
Glucose, UA: NEGATIVE
Ketones, UA: NEGATIVE
Leukocytes,UA: NEGATIVE
Nitrite, UA: NEGATIVE
Specific Gravity, UA: 1.021 (ref 1.005–1.030)
Urobilinogen, Ur: 0.2 mg/dL (ref 0.2–1.0)
pH, UA: 6.5 (ref 5.0–7.5)

## 2020-03-03 MED ORDER — NITROGLYCERIN 0.4 MG SL SUBL: SUBLINGUAL_TABLET | SUBLINGUAL | 3 refills | Status: DC

## 2020-03-03 NOTE — Progress Notes (Signed)
Cardiology Office Note:    Date:  03/05/2020   ID:  Amber Wong, DOB 07-02-1955, MRN [id number]  PCP:  Amber Beals, NP  [facility] Cardiologist:  Shelva Majestic, MD  [place] Electrophysiologist:  None   Referring MD: Amber Beals, NP   No chief complaint on file.   History of Present Illness:    Amber Wong is a 64 y.o. female with a hx of morbid obesity,HTN,HLD,OSAon CPAP, and CADs/pCABG x3 in May 2015 with LIMA to LAD, SVG to OM, and SVG to diagonal. Myoview for atypical chest pain in August 2017 was normal. Echocardiogram in August 2018 showed EF 55 to 60%, grade 1 DD, ascending aorta measuring 39 mm, PA peak pressure 55mmHg. Myoview obtained in April 2019 was low risk with EF of 63%.  Patient was last seen virtually by Dr. Claiborne Billings on 09/25/2019 at which time she was compliant with her CPAP therapy.  I last saw the patient on 01/15/2020 for leg pain and weakness.  He had intermittent chest pain as well.  We initially instructed her to go to the ED, however she did not want to go.  Her chest pain was atypical.  EKG does not show any ischemic changes.  I recommend observation and she is returning today for reassessment.  Patient presents today for follow-up.  She continued to have intermittent atypical chest pain about once a week.  The frequency has decreased.  Chest pain only occurs about a second before resolving.  Symptom also tend to occur at rest.  She has been able to walk around in the sweeping floors at home without any exertional symptoms.  At this point I do not recommend any further work-up.  She can see Dr. Claiborne Billings as previously scheduled in November.   Past Medical History:  Diagnosis Date  . Asthma   . Depression   . Hypercholesteremia   . Hypertension   . Morbid obesity ([place])   . Nocturia   . Obstructive sleep apnea (adult) (pediatric) 12/27/2012   AHI 17.7 baseline, titrated to 6 cm , AHi now 2.4 - compliance   . OSA  (obstructive sleep apnea)    w/ hypoventilation  . Sleep apnea     Past Surgical History:  Procedure Laterality Date  . BREAST EXCISIONAL BIOPSY Right   . CORONARY ARTERY BYPASS GRAFT N/A 10/20/2013   Procedure: CORONARY ARTERY BYPASS GRAFTING (CABG);  Surgeon: Ivin Poot, MD;  Location: [place];  Service: Open Heart Surgery;  Laterality: N/A;  CABG x 3  . CYSTECTOMY    . INTRAOPERATIVE TRANSESOPHAGEAL ECHOCARDIOGRAM N/A 10/20/2013   Procedure: INTRAOPERATIVE TRANSESOPHAGEAL ECHOCARDIOGRAM;  Surgeon: Ivin Poot, MD;  Location: [place];  Service: Open Heart Surgery;  Laterality: N/A;  . KNEE SURGERY     chronic pain and limping since  . LEFT HEART CATHETERIZATION WITH CORONARY ANGIOGRAM N/A 10/16/2013   Procedure: LEFT HEART CATHETERIZATION WITH CORONARY ANGIOGRAM;  Surgeon: Troy Sine, MD;  Location: [facility] CATH LAB;  Service: Cardiovascular;  Laterality: N/A;  . PARTIAL HYSTERECTOMY    . TONSILLECTOMY      Current Medications: Current Meds  Medication Sig  . acyclovir (ZOVIRAX) 200 MG capsule Take 200 mg by mouth every morning.  Marland Kitchen albuterol (VENTOLIN HFA) 108 (90 Base) MCG/ACT inhaler Inhale into the lungs every 6 (six) hours as needed for wheezing or shortness of breath.  . allopurinol (ZYLOPRIM) 100 MG tablet Take 100 mg by mouth 3 (three) times daily.  Marland Kitchen aspirin 81 MG tablet  Take 81 mg by mouth daily.   Marland Kitchen atorvastatin (LIPITOR) 40 MG tablet Take 1 tablet (40 mg total) by mouth daily.  Marland Kitchen buPROPion (WELLBUTRIN) 100 MG tablet Take 100 mg by mouth 2 (two) times daily.  . cholecalciferol (VITAMIN D) 1000 units tablet Take 1,000 Units by mouth daily.  . cromolyn (OPTICROM) 4 % ophthalmic solution Place 1 drop into both eyes 4 (four) times daily.  Marland Kitchen ezetimibe (ZETIA) 10 MG tablet Take 1 tablet (10 mg total) by mouth daily.  . fluconazole (DIFLUCAN) 150 MG tablet Take 1 tablet by mouth daily as needed. When yeast infection is present  . lisinopril (ZESTRIL) 40 MG tablet Take 1 tablet  (40 mg total) by mouth daily.  . meclizine (ANTIVERT) 25 MG tablet meclizine 25 mg tablet  Take 1 tablet 3 times a day by oral route as needed for 10 days.  . meloxicam (MOBIC) 15 MG tablet Take 15 mg by mouth daily.  . metoprolol tartrate (LOPRESSOR) 25 MG tablet TAKE 2 TABLETS IN THE MORNING AND 1 TABLET AT NIGHT  . nitroGLYCERIN (NITROSTAT) 0.4 MG SL tablet PLACE 1 TABLET UNDER THE TONGUE EVERY 5 MINUTES AS NEEDED FOR CHEST PAIN.  Marland Kitchen pantoprazole (PROTONIX) 40 MG tablet Take 1 tablet (40 mg total) by mouth 2 (two) times daily.  Marland Kitchen PARoxetine (PAXIL) 20 MG tablet Take 20 mg by mouth every morning.  . triamcinolone cream (KENALOG) 0.1 % Apply 1 application topically 2 (two) times daily.  . [DISCONTINUED] nitroGLYCERIN (NITROSTAT) 0.4 MG SL tablet PLACE 1 TABLET UNDER THE TONGUE EVERY 5 MINUTES AS NEEDED FOR CHEST PAIN.     Allergies:   Iohexol, Penicillins, and Shellfish allergy   Social History   Socioeconomic History  . Marital status: Divorced    Spouse name: Not on file  . Number of children: Not on file  . Years of education: 44  . Highest education level: Not on file  Occupational History    Employer: UNEMPLOYED    Comment: history of shift work  Tobacco Use  . Smoking status: Former Smoker    Years: 15.00    Types: Cigarettes  . Smokeless tobacco: Never Used  . Tobacco comment: Quit 2005  Vaping Use  . Vaping Use: Never used  Substance and Sexual Activity  . Alcohol use: No  . Drug use: No  . Sexual activity: Never  Other Topics Concern  . Not on file  Social History Narrative   Patient lives at home alone.    Social Determinants of Health   Financial Resource Strain:   . Difficulty of Paying Living Expenses: Not on file  Food Insecurity:   . Worried About Charity fundraiser in the Last Year: Not on file  . Ran Out of Food in the Last Year: Not on file  Transportation Needs:   . Lack of Transportation (Medical): Not on file  . Lack of Transportation  (Non-Medical): Not on file  Physical Activity:   . Days of Exercise per Week: Not on file  . Minutes of Exercise per Session: Not on file  Stress:   . Feeling of Stress : Not on file  Social Connections:   . Frequency of Communication with Friends and Family: Not on file  . Frequency of Social Gatherings with Friends and Family: Not on file  . Attends Religious Services: Not on file  . Active Member of Clubs or Organizations: Not on file  . Attends Archivist Meetings: Not on file  .  Marital Status: Not on file     Family History: The patient's family history includes Breast cancer in her cousin and paternal aunt; Breast cancer (age of onset: 56) in her sister; Cancer in her sister; Heart attack in her sister; Heart attack (age of onset: 48) in her mother; Heart attack (age of onset: 81) in her father.  ROS:   Please see the history of present illness.     All other systems reviewed and are negative.  EKGs/Labs/Other Studies Reviewed:    The following studies were reviewed today:  Myoview 10/03/2017  The left ventricular ejection fraction is normal (55-65%).  Nuclear stress EF: 63%.  There was no ST segment deviation noted during stress.  The study is normal.  This is a low risk study.   Normal stress nuclear study with no ischemia or infarction; EF 63 with normal wall motion.   EKG:  EKG is not ordered today.   Recent Labs: 12/26/2019: ALT 28; BUN 8; Creatinine, Ser 0.79; Hemoglobin 15.1; Platelets 240; Potassium 3.9; Sodium 146; TSH 1.180  Recent Lipid Panel    Component Value Date/Time   CHOL 125 12/26/2019 1600   TRIG 66 12/26/2019 1600   HDL 47 12/26/2019 1600   CHOLHDL 2.7 12/26/2019 1600   CHOLHDL 2.8 07/07/2016 1501   VLDL 16 07/07/2016 1501   LDLCALC 64 12/26/2019 1600    Physical Exam:    VS:  BP 136/78   Pulse (!) 58   Ht 5\' 8"  (1.727 m)   Wt 234 lb (106.1 kg)   SpO2 95%   BMI 35.58 kg/m     Wt Readings from Last 3 Encounters:    03/03/20 234 lb (106.1 kg)  02/04/20 229 lb 12.8 oz (104.2 kg)  09/25/19 235 lb (106.6 kg)     GEN:  Well nourished, well developed in no acute distress HEENT: Normal NECK: No JVD; No carotid bruits LYMPHATICS: No lymphadenopathy CARDIAC: RRR, no murmurs, rubs, gallops RESPIRATORY:  Clear to auscultation without rales, wheezing or rhonchi  ABDOMEN: Soft, non-tender, non-distended MUSCULOSKELETAL:  No edema; No deformity  SKIN: Warm and dry NEUROLOGIC:  Alert and oriented x 3 PSYCHIATRIC:  Normal affect   ASSESSMENT:    1. Atypical chest pain   2. Abdominal pain, unspecified abdominal location   3. Coronary artery disease involving coronary bypass graft of native heart with angina pectoris ([place])   4. Essential hypertension   5. Hyperlipidemia LDL goal <70   6. OSA on CPAP    PLAN:    In order of problems listed above:  1. Atypical chest pain: Symptom only last about a second each time.  No further work-up is needed, this is unlikely to be cardiac in nature.  Last Myoview in 2019 was low risk  2. Abdominal pain: She is concerned of possible UTI, I will do a urinalysis, if positive will treat with antibiotic.  She will need to follow-up with her primary care provider.  3. CAD s/p CABG: Continue aspirin and Lipitor  4. Hypertension: Blood pressure stable  5. Hyperlipidemia: On Lipitor  6. Obstructive sleep apnea: On CPAP therapy.   Medication Adjustments/Labs and Tests Ordered: Current medicines are reviewed at length with the patient today.  Concerns regarding medicines are outlined above.  Orders Placed This Encounter  Procedures  . Microscopic Examination  . Urinalysis, Routine w reflex microscopic   Meds ordered this encounter  Medications  . nitroGLYCERIN (NITROSTAT) 0.4 MG SL tablet    Sig: PLACE 1 TABLET  UNDER THE TONGUE EVERY 5 MINUTES AS NEEDED FOR CHEST PAIN.    Dispense:  25 tablet    Refill:  3    Patient Instructions  Medication Instructions:   Your physician recommends that you continue on your current medications as directed. Please refer to the Current Medication list given to you today.  *If you need a refill on your cardiac medications before your next appointment, please call your pharmacy*  Lab Work: Your physician recommends that you return for lab work in:   Urinalysis If you have labs (blood work) drawn today and your tests are completely normal, you will receive your results only by: Marland Kitchen MyChart Message (if you have MyChart) OR . A paper copy in the mail If you have any lab test that is abnormal or we need to change your treatment, we will call you to review the results.  Testing/Procedures: NONE ordered at this time of appointment   Follow-Up: At [facility], you and your health needs are our priority.  As part of our continuing mission to provide you with exceptional heart care, we have created designated Provider Care Teams.  These Care Teams include your primary Cardiologist (physician) and Advanced Practice Providers (APPs -  Physician Assistants and Nurse Practitioners) who all work together to provide you with the care you need, when you need it.  Your next appointment:   Follow up as scheduled  05/10/20 at 3:40 PM  The format for your next appointment:   In Person  Provider:   Shelva Majestic, MD  Other Instructions      Signed, Amber Wong, [place]  03/05/2020 7:09 PM    [place]

## 2020-03-03 NOTE — Patient Instructions (Addendum)
Medication Instructions:  Your physician recommends that you continue on your current medications as directed. Please refer to the Current Medication list given to you today.  *If you need a refill on your cardiac medications before your next appointment, please call your pharmacy*  Lab Work: Your physician recommends that you return for lab work in:   Urinalysis If you have labs (blood work) drawn today and your tests are completely normal, you will receive your results only by: Marland Kitchen MyChart Message (if you have MyChart) OR . A paper copy in the mail If you have any lab test that is abnormal or we need to change your treatment, we will call you to review the results.  Testing/Procedures: NONE ordered at this time of appointment   Follow-Up: At [facility], you and your health needs are our priority.  As part of our continuing mission to provide you with exceptional heart care, we have created designated Provider Care Teams.  These Care Teams include your primary Cardiologist (physician) and Advanced Practice Providers (APPs -  Physician Assistants and Nurse Practitioners) who all work together to provide you with the care you need, when you need it.  Your next appointment:   Follow up as scheduled  05/10/20 at 3:40 PM  The format for your next appointment:   In Person  Provider:   Shelva Majestic, MD  Other Instructions

## 2020-03-04 NOTE — Progress Notes (Signed)
No sign of urinary tract infection, but she has some red blood cell and crystals in her urine. Unclear if Mrs. Amber Wong could have a kidney stone, please forward the lab to her PCP and have her seen her PCP seen

## 2020-05-10 ENCOUNTER — Ambulatory Visit: Payer: Medicaid Other | Admitting: Cardiovascular Disease

## 2020-05-12 ENCOUNTER — Encounter: Payer: Self-pay | Admitting: Physician Assistant

## 2020-05-12 ENCOUNTER — Telehealth (INDEPENDENT_AMBULATORY_CARE_PROVIDER_SITE_OTHER): Payer: Medicaid Other | Admitting: Physician Assistant

## 2020-05-12 ENCOUNTER — Telehealth: Payer: Self-pay | Admitting: Licensed Clinical Social Worker

## 2020-05-12 DIAGNOSIS — E785 Hyperlipidemia, unspecified: Secondary | ICD-10-CM | POA: Diagnosis not present

## 2020-05-12 DIAGNOSIS — I25810 Atherosclerosis of coronary artery bypass graft(s) without angina pectoris: Secondary | ICD-10-CM

## 2020-05-12 DIAGNOSIS — I10 Essential (primary) hypertension: Secondary | ICD-10-CM | POA: Diagnosis not present

## 2020-05-12 NOTE — Progress Notes (Signed)
Virtual Visit via Telephone Note   This visit type was conducted due to national recommendations for restrictions regarding the COVID-19 Pandemic (e.g. social distancing) in an effort to limit this patient's exposure and mitigate transmission in our community.  Due to her co-morbid illnesses, this patient is at least at moderate risk for complications without adequate follow up.  This format is felt to be most appropriate for this patient at this time.  The patient did not have access to video technology/had technical difficulties with video requiring transitioning to audio format only (telephone).  All issues noted in this document were discussed and addressed.  No physical exam could be performed with this format.  Please refer to the patient's chart for her  consent to telehealth for [facility].    Date:  05/14/2020   ID:  Amber Wong, DOB 1955/10/12, MRN [id number] The patient was identified using 2 identifiers.  Patient Location: Home Provider Location: Home Office  PCP:  Everardo Beals, NP  Cardiologist:  Shelva Majestic, MD  Electrophysiologist:  None   Evaluation Performed:  Follow-Up Visit  Chief Complaint:  Follow up  History of Present Illness:    Amber Wong is a 64 y.o. female with a hx of morbid obesity,HTN,HLD,OSAon CPAP, and CADs/pCABG x3 in May 2015 with LIMA to LAD, SVG to OM, and SVG to diagonal. Myoview for atypical chest pain in August 2017 was normal. Echocardiogram in August 2018 showed EF 55 to 60%, grade 1 DD, ascending aorta measuring 39 mm, PA peak pressure 64mmHg.Myoview obtained in April 2019 was low risk with EF of 63%.Patient was last seen virtually by Dr. Claiborne Billings on 09/25/2019 at which time she was compliant with her CPAP therapy.  I last saw the patient on 01/15/2020 for leg pain and weakness.  She had intermittent chest pain as well.  We initially instructed her to go to the ED, however she did not want to go.  Her chest  pain was atypical.  EKG does not show any ischemic changes.  I last saw the patient on 03/03/2020 and her chest pain remained quite atypical at the time.   Patient presents today for virtual visit.  She still has some atypical chest pain, some of this is related to emotional stress.  Unfortunately she has both financial and family issues.  She can barely pay for her rent and does not have enough money to guarantee food intake.  She is not on very good terms with her landlord.  We will reach out to our social worker to see if patient qualify for patient care found.  Otherwise she has no significant lower extremity edema, orthopnea or PND.  She is stable from cardiac perspective.  She can follow-up in 6 months.  The patient does not have symptoms concerning for COVID-19 infection (fever, chills, cough, or new shortness of breath).    Past Medical History:  Diagnosis Date  . Asthma   . Depression   . Hypercholesteremia   . Hypertension   . Morbid obesity ([place])   . Nocturia   . Obstructive sleep apnea (adult) (pediatric) 12/27/2012   AHI 17.7 baseline, titrated to 6 cm , AHi now 2.4 - compliance   . OSA (obstructive sleep apnea)    w/ hypoventilation  . Sleep apnea    Past Surgical History:  Procedure Laterality Date  . BREAST EXCISIONAL BIOPSY Right   . CORONARY ARTERY BYPASS GRAFT N/A 10/20/2013   Procedure: CORONARY ARTERY BYPASS GRAFTING (CABG);  Surgeon: Tharon Aquas  Kerby Less, MD;  Location: [place];  Service: Open Heart Surgery;  Laterality: N/A;  CABG x 3  . CYSTECTOMY    . INTRAOPERATIVE TRANSESOPHAGEAL ECHOCARDIOGRAM N/A 10/20/2013   Procedure: INTRAOPERATIVE TRANSESOPHAGEAL ECHOCARDIOGRAM;  Surgeon: Ivin Poot, MD;  Location: [place];  Service: Open Heart Surgery;  Laterality: N/A;  . KNEE SURGERY     chronic pain and limping since  . LEFT HEART CATHETERIZATION WITH CORONARY ANGIOGRAM N/A 10/16/2013   Procedure: LEFT HEART CATHETERIZATION WITH CORONARY ANGIOGRAM;  Surgeon: Troy Sine,  MD;  Location: [facility] CATH LAB;  Service: Cardiovascular;  Laterality: N/A;  . PARTIAL HYSTERECTOMY    . TONSILLECTOMY       Current Meds  Medication Sig  . acyclovir (ZOVIRAX) 200 MG capsule Take 200 mg by mouth every morning.  Marland Kitchen albuterol (VENTOLIN HFA) 108 (90 Base) MCG/ACT inhaler Inhale into the lungs every 6 (six) hours as needed for wheezing or shortness of breath.  . allopurinol (ZYLOPRIM) 100 MG tablet Take 100 mg by mouth 3 (three) times daily.  Marland Kitchen aspirin 81 MG tablet Take 81 mg by mouth daily.   Marland Kitchen atorvastatin (LIPITOR) 40 MG tablet Take 1 tablet (40 mg total) by mouth daily.  Marland Kitchen buPROPion (WELLBUTRIN) 100 MG tablet Take 100 mg by mouth 2 (two) times daily.  . cholecalciferol (VITAMIN D) 1000 units tablet Take 1,000 Units by mouth daily.  . clotrimazole (LOTRIMIN) 1 % cream clotrimazole 1 % topical cream  . cromolyn (OPTICROM) 4 % ophthalmic solution Place 1 drop into both eyes 4 (four) times daily.  Marland Kitchen ezetimibe (ZETIA) 10 MG tablet Take 1 tablet (10 mg total) by mouth daily.  . fluconazole (DIFLUCAN) 150 MG tablet Take 1 tablet by mouth daily as needed. When yeast infection is present  . fluticasone (FLOVENT HFA) 110 MCG/ACT inhaler Flovent HFA 110 mcg/actuation aerosol inhaler  . lisinopril (ZESTRIL) 40 MG tablet Take 1 tablet (40 mg total) by mouth daily.  . meclizine (ANTIVERT) 25 MG tablet meclizine 25 mg tablet  Take 1 tablet 3 times a day by oral route as needed for 10 days.  . meloxicam (MOBIC) 15 MG tablet Take 15 mg by mouth as needed.   . metoprolol tartrate (LOPRESSOR) 25 MG tablet TAKE 2 TABLETS IN THE MORNING AND 1 TABLET AT NIGHT  . metroNIDAZOLE (METROGEL) 0.75 % vaginal gel metronidazole 0.75 % vaginal gel  . nitroGLYCERIN (NITROSTAT) 0.4 MG SL tablet PLACE 1 TABLET UNDER THE TONGUE EVERY 5 MINUTES AS NEEDED FOR CHEST PAIN.  Marland Kitchen Olopatadine HCl (PATADAY OP)   . pantoprazole (PROTONIX) 40 MG tablet Take 1 tablet (40 mg total) by mouth 2 (two) times daily.  Marland Kitchen PARoxetine  (PAXIL) 20 MG tablet Take 20 mg by mouth every morning.     Allergies:   Grass pollen([id number]-o-r-t-swt vern), Iohexol, Penicillins, and Shellfish allergy   Social History   Tobacco Use  . Smoking status: Former Smoker    Years: 15.00    Types: Cigarettes  . Smokeless tobacco: Never Used  . Tobacco comment: Quit 2005  Vaping Use  . Vaping Use: Never used  Substance Use Topics  . Alcohol use: No  . Drug use: No     Family Hx: The patient's family history includes Breast cancer in her cousin and paternal aunt; Breast cancer (age of onset: 40) in her sister; Cancer in her sister; Heart attack in her sister; Heart attack (age of onset: 71) in her mother; Heart attack (age of onset: 31) in her  father.  ROS:   Please see the history of present illness.     All other systems reviewed and are negative.   Prior CV studies:   The following studies were reviewed today:  Myoview 10/03/2017  The left ventricular ejection fraction is normal (55-65%).  Nuclear stress EF: 63%.  There was no ST segment deviation noted during stress.  The study is normal.  This is a low risk study.   Normal stress nuclear study with no ischemia or infarction; EF 63 with normal wall motion.   Labs/Other Tests and Data Reviewed:    EKG:  An ECG dated 02/04/2020 was personally reviewed today and demonstrated:  Sinus rhythm without significant ST-T wave changes.  Recent Labs: 12/26/2019: ALT 28; BUN 8; Creatinine, Ser 0.79; Hemoglobin 15.1; Platelets 240; Potassium 3.9; Sodium 146; TSH 1.180   Recent Lipid Panel Lab Results  Component Value Date/Time   CHOL 125 12/26/2019 04:00 PM   TRIG 66 12/26/2019 04:00 PM   HDL 47 12/26/2019 04:00 PM   CHOLHDL 2.7 12/26/2019 04:00 PM   CHOLHDL 2.8 07/07/2016 03:01 PM   LDLCALC 64 12/26/2019 04:00 PM    Wt Readings from Last 3 Encounters:  03/03/20 234 lb (106.1 kg)  02/04/20 229 lb 12.8 oz (104.2 kg)  09/25/19 235 lb (106.6 kg)     Risk  Assessment/Calculations:      Objective:    Vital Signs:  There were no vitals taken for this visit.   VITAL SIGNS:  reviewed  ASSESSMENT & PLAN:    1. CAD s/p CABG: Her recent chest pain has been atypical.  No further work-up.  Symptom associated with emotional stress.  Continue aspirin and Lipitor  2. Hypertension: Continue with current therapy  3. Hyperlipidemia: On Lipitor.  4. Social and financial issues: She has problem paying for her rent and food.  Medication cost is an issue as well.  We will reach out to our social worker to see if we can offer her some assistance.   Shared Decision Making/Informed Consent        COVID-19 Education: The signs and symptoms of COVID-19 were discussed with the patient and how to seek care for testing (follow up with PCP or arrange E-visit).  The importance of social distancing was discussed today.  Time:   Today, I have spent 11 minutes with the patient with telehealth technology discussing the above problems.     Medication Adjustments/Labs and Tests Ordered: Current medicines are reviewed at length with the patient today.  Concerns regarding medicines are outlined above.   Tests Ordered: No orders of the defined types were placed in this encounter.   Medication Changes: No orders of the defined types were placed in this encounter.   Follow Up:  In Person in 6 month(s)  Signed, Almyra Deforest, [place]  05/14/2020 10:16 PM    [place]

## 2020-05-12 NOTE — Telephone Encounter (Signed)
Messages left for pt referrals at [facility] [phone]) and [facility] for [facility] and [place] [facility] [phone]).  Westley Hummer, MSW, [place] Work

## 2020-05-12 NOTE — Patient Instructions (Signed)
Medication Instructions:  Your physician recommends that you continue on your current medications as directed. Please refer to the Current Medication list given to you today.  *If you need a refill on your cardiac medications before your next appointment, please call your pharmacy*  Lab Work: NONE ordered at this time of appointment   If you have labs (blood work) drawn today and your tests are completely normal, you will receive your results only by: . MyChart Message (if you have MyChart) OR . A paper copy in the mail If you have any lab test that is abnormal or we need to change your treatment, we will call you to review the results.  Testing/Procedures: NONE ordered at this time of appointment   Follow-Up: At [facility], you and your health needs are our priority.  As part of our continuing mission to provide you with exceptional heart care, we have created designated Provider Care Teams.  These Care Teams include your primary Cardiologist (physician) and Advanced Practice Providers (APPs -  Physician Assistants and Nurse Practitioners) who all work together to provide you with the care you need, when you need it.  Your next appointment:   3-4 month(s)  The format for your next appointment:   In Person  Provider:   Thomas Kelly, MD  Other Instructions   

## 2020-05-12 NOTE — Progress Notes (Signed)
Heart and Vascular Care Navigation  05/12/2020  Mariana Wiederholt December 07, 1955 [id number]  Reason for Referral:  Stress, financial challenges related to housing and medication                                                                                                   Assessment:   CSW spoke with pt via telephone. Introduced self, role, reason for call. Pt lives home alone, she has a caregiver that comes in for 7 days a week. Pt confirmed home address, PCP, and contact information. She has access to a working phone and rarely checks her e-mail. She currently receives SSDI and food stamps.   She has multiple children that live nearby but shares she believes they are too busy and don't have time to assist her. Pt very overwhelmed by multiple factors including her current income, challenges w/ landlord, and cost of her eye drops. She saw a counselor in the past and had mixed experiences. Pt does feel she would benefit from talking to a mental health professional but found it was hard to find providers who take Medicaid.   When asked about pt medication difficulties pt states her Medicaid takes care of the majority of her medications but her eye drops ([place] OP) run more expensive than she can usually afford.   This Probation officer and pt did discuss that I had been made aware that her [place] Medicaid [employer] [facility] is not in network with [employer] providers. Pt was not aware and likes her current providers, I provided her with the [place] Managed Medicaid hotline which is [phone] should she be interested in discussing other options that would allow her to remain in network with her [place] providers.                             HRT/VAS Care Coordination    Patients Home Cardiology Office [place] Team Social Worker   Social Worker Name: Westley Hummer, LCSW, [facility]   Living arrangements for the past 2 months Apartment   Lives with: Self    Patient Current Insurance Coverage Medicaid   Patient Has Concern With Paying Medical Bills No   Does Patient Have Prescription Coverage? Yes   Home Assistive Devices/Equipment Walker (specify type); Eyeglasses; CPAP; Dentures (specify type)  rolling walker, upper partial   Current home services Homehealth aide      Social History:                                                                             SDOH Screenings   Alcohol Screen:   . Last Alcohol Screening Score (AUDIT): Not on file  Depression (PHQ2-9):   . PHQ-2 Score: Not on file  Financial Resource  Strain: High Risk  . Difficulty of Paying Living Expenses: Hard  Food Insecurity: Food Insecurity Present  . Worried About Charity fundraiser in the Last Year: Sometimes true  . Ran Out of Food in the Last Year: Sometimes true  Housing: High Risk  . Last Housing Risk Score: 2  Physical Activity:   . Days of Exercise per Week: Not on file  . Minutes of Exercise per Session: Not on file  Social Connections:   . Frequency of Communication with Friends and Family: Not on file  . Frequency of Social Gatherings with Friends and Family: Not on file  . Attends Religious Services: Not on file  . Active Member of Clubs or Organizations: Not on file  . Attends Archivist Meetings: Not on file  . Marital Status: Not on file  Stress: Stress Concern Present  . Feeling of Stress : Very much  Tobacco Use: Medium Risk  . Smoking Tobacco Use: Former Smoker  . Smokeless Tobacco Use: Never Used  Transportation Needs: No Transportation Needs  . Lack of Transportation (Medical): No  . Lack of Transportation (Non-Medical): No    SDOH Interventions: Financial Resources:   Pt states her rent continues to rise, worried she will not be able to afford it and will lose it.   Food Insecurity:   Pt receives food stamps, she does have some challenges with getting her   Housing Insecurity:  Housing Interventions: Other (Comment)  (Referral to [facility] for [place])  Transportation:    Pt utilizes Medicaid transportation to get her appointments and as needed    Other Care Navigation Interventions:     Provided Pharmacy assistance resources  Will look into affordability around medications, states eye drops are her main cost.   Patient expressed Mental Health concerns Yes, multiple psychosocial stressors. Takes antidepressant. Saw counselors in the past, interested in seeing another one  Patient [facility]: [facility]   Follow-up plan:   CSW received permission to send referrals to [facility] and [facility] to see if they will be able to assist with pt housing challenges. CSW also received permission to [facility] for counseling and medication management- who accepts uninsured and Medicaid pts in [place].  CSW will f/u with any additional assistance information that may be pertinent to pt.

## 2020-05-13 ENCOUNTER — Telehealth: Payer: Self-pay | Admitting: Licensed Clinical Social Worker

## 2020-05-13 NOTE — Telephone Encounter (Signed)
CSW received a call back from both [facility] and [place] w/ [facility] for Housing and Community Studies. They will reach out to pt to schedule appointment for counseling/medication management and assistance w/ utilities/landlord challenges respectively.   Westley Hummer, MSW, [place] Work

## 2020-05-13 NOTE — Telephone Encounter (Signed)
CSW spoke with pt at [phone]. Reintroduced self, role, reason for call. Pt has been in touch with both agencies CSW had referred her to, she is already working on gathering documents for assistance applications.   CSW also discussed challenges w/ cost of her eye drops. CSW found discount coupon on [facility] for them at [facility] that would make them more affordable. Pt requested I mail her the coupon. CSW placed in mail box at office on 12/2. Pt given my number as well to contact me with any additional concerns.   Westley Hummer, MSW, [place] Work

## 2020-05-14 ENCOUNTER — Encounter: Payer: Self-pay | Admitting: Physician Assistant

## 2020-05-21 ENCOUNTER — Telehealth: Payer: Self-pay | Admitting: Licensed Clinical Social Worker

## 2020-05-21 NOTE — Telephone Encounter (Signed)
Pt called this Probation officer requesting number for eviction mediation number through [facility] for Housing and [employer]. CSW provided number. Pt shares she would like to find a new apartment by April and that she'd like to keep the rent at $500 or less due to her limited income. Pt had been provided w/ information about [employer] but states she does not like the neighborhood it is in. CSW and pt discussed that the affordable housing market in [place] is very tight at this time and that options are often limited. CSW offered to send list of apartments that may be in pt price range < $600 for her to review from [employer]. Pt agreeable, list placed in mail.   Pt aware that CSW remains available to assist as able with any additional pt needs as they arise.   Westley Hummer, MSW, [place]  [phone]

## 2020-07-08 ENCOUNTER — Ambulatory Visit (INDEPENDENT_AMBULATORY_CARE_PROVIDER_SITE_OTHER): Payer: Medicaid Other | Admitting: Licensed Clinical Social Worker

## 2020-07-08 ENCOUNTER — Other Ambulatory Visit: Payer: Self-pay

## 2020-07-08 ENCOUNTER — Ambulatory Visit (HOSPITAL_COMMUNITY): Payer: Medicaid Other | Admitting: Physician Assistant

## 2020-07-08 DIAGNOSIS — F418 Other specified anxiety disorders: Secondary | ICD-10-CM | POA: Diagnosis not present

## 2020-07-12 ENCOUNTER — Telehealth (HOSPITAL_COMMUNITY): Payer: Medicaid Other | Admitting: Psychiatry
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
## 2020-07-18 ENCOUNTER — Other Ambulatory Visit: Payer: Self-pay | Admitting: Cardiovascular Disease

## 2020-08-11 ENCOUNTER — Other Ambulatory Visit: Payer: Self-pay | Admitting: Cardiovascular Disease

## 2020-08-12 ENCOUNTER — Telehealth (HOSPITAL_COMMUNITY): Payer: Self-pay | Admitting: Licensed Clinical Social Worker

## 2020-08-12 ENCOUNTER — Ambulatory Visit (HOSPITAL_COMMUNITY): Payer: Medicaid Other | Admitting: Licensed Clinical Social Worker

## 2020-08-12 ENCOUNTER — Other Ambulatory Visit: Payer: Self-pay

## 2020-08-12 NOTE — Telephone Encounter (Signed)
LCSW called pt for phone session per schedule. Call went directly into vm. LCSW left detailed message with purpose of call. Provided call back # for pt to learn rescheduling options as desired.

## 2020-08-20 ENCOUNTER — Other Ambulatory Visit: Payer: Self-pay

## 2020-08-20 ENCOUNTER — Telehealth (INDEPENDENT_AMBULATORY_CARE_PROVIDER_SITE_OTHER): Payer: Medicaid Other | Admitting: Psychiatry

## 2020-08-20 ENCOUNTER — Encounter (HOSPITAL_COMMUNITY): Payer: Self-pay | Admitting: Psychiatry

## 2020-08-20 DIAGNOSIS — F411 Generalized anxiety disorder: Secondary | ICD-10-CM | POA: Diagnosis not present

## 2020-08-20 DIAGNOSIS — F330 Major depressive disorder, recurrent, mild: Secondary | ICD-10-CM

## 2020-08-20 MED ORDER — BUPROPION HCL 100 MG PO TABS: 100.0000 mg | ORAL_TABLET | Freq: Two times a day (BID) | ORAL | 2 refills | Status: DC

## 2020-08-20 MED ORDER — PAROXETINE HCL 20 MG PO TABS: 20.0000 mg | ORAL_TABLET | ORAL | 2 refills | Status: DC
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
## 2020-08-30 ENCOUNTER — Ambulatory Visit (HOSPITAL_COMMUNITY): Payer: Self-pay | Admitting: Licensed Clinical Social Worker

## 2020-09-09 ENCOUNTER — Ambulatory Visit (HOSPITAL_COMMUNITY): Payer: Medicaid Other | Admitting: Clinical

## 2020-09-10 ENCOUNTER — Telehealth: Payer: Self-pay

## 2020-09-10 NOTE — Telephone Encounter (Signed)
Patient calling into [facility] and leaving message that someone called her and hung up.  I am wondering if they are calling to confirm appointment with Dr Claiborne Amber Wong on 4/4.  Any ideas?  Thank you

## 2020-09-13 ENCOUNTER — Ambulatory Visit (INDEPENDENT_AMBULATORY_CARE_PROVIDER_SITE_OTHER): Payer: Medicaid Other | Admitting: Cardiovascular Disease

## 2020-09-13 ENCOUNTER — Other Ambulatory Visit: Payer: Self-pay

## 2020-09-13 ENCOUNTER — Encounter: Payer: Self-pay | Admitting: Cardiovascular Disease

## 2020-09-13 VITALS — BP 142/90 | HR 62 | Ht 68.0 in | Wt 229.4 lb

## 2020-09-13 DIAGNOSIS — G4733 Obstructive sleep apnea (adult) (pediatric): Secondary | ICD-10-CM

## 2020-09-13 DIAGNOSIS — Z951 Presence of aortocoronary bypass graft: Secondary | ICD-10-CM

## 2020-09-13 DIAGNOSIS — I25810 Atherosclerosis of coronary artery bypass graft(s) without angina pectoris: Secondary | ICD-10-CM | POA: Diagnosis not present

## 2020-09-13 DIAGNOSIS — I10 Essential (primary) hypertension: Secondary | ICD-10-CM

## 2020-09-13 DIAGNOSIS — E785 Hyperlipidemia, unspecified: Secondary | ICD-10-CM

## 2020-09-13 DIAGNOSIS — Z9989 Dependence on other enabling machines and devices: Secondary | ICD-10-CM

## 2020-09-13 DIAGNOSIS — E668 Other obesity: Secondary | ICD-10-CM

## 2020-09-13 MED ORDER — AMLODIPINE BESYLATE 5 MG PO TABS: 5.0000 mg | ORAL_TABLET | Freq: Every day | ORAL | 3 refills | Status: DC

## 2020-09-13 NOTE — Patient Instructions (Signed)
Medication Instructions:  Increase Amlodipine to 5 mg daily   *If you need a refill on your cardiac medications before your next appointment, please call your pharmacy*   Follow-Up: At [facility], you and your health needs are our priority.  As part of our continuing mission to provide you with exceptional heart care, we have created designated Provider Care Teams.  These Care Teams include your primary Cardiologist (physician) and Advanced Practice Providers (APPs -  Physician Assistants and Nurse Practitioners) who all work together to provide you with the care you need, when you need it.  We recommend signing up for the patient portal called "MyChart".  Sign up information is provided on this After Visit Summary.  MyChart is used to connect with patients for Virtual Visits (Telemedicine).  Patients are able to view lab/test results, encounter notes, upcoming appointments, etc.  Non-urgent messages can be sent to your provider as well.   To learn more about what you can do with MyChart, go to [email or web address].    Your next appointment:   6 month(s)  The format for your next appointment:   In Person  Provider:   Shelva Majestic, MD

## 2020-09-13 NOTE — Progress Notes (Signed)
Patient ID: Amber Wong, female   DOB: [birth year], 65 y.o.   MRN: [id number]     HPI: Amber Wong is a 65 y.o. female who presents to the office today for a 67 month cardiology evaluation.   Amber Wong has a history of morbid obesity, hypertension, hyperlipidemia, and strong family history for CAD.  She was admitted to [facility] hospital in May 2015 with chest pain described as a "elephant "sitting on her chest.  I performed cardiac catheterization on 10/16/2013 and she was found to have hyperdynamic LV function and evidence for severe 80% distal left main stenosis with 50% diffuse proximal LAD stenosis and otherwise normal circumflex and RCA vessels.  She underwent CABG x3 revascularization surgery by Dr. Myles Lipps and had a LIMA placed to her LAD, SVG to obtuse marginal, and SVG to her diagonal vessel with Endo vascular vein harvesting from her right thigh.   She was seen by Tarri Fuller in October 2015 with complaints involving her left leg, knee, and great toe.  When I saw her in February 2016 she noted marked improvement in her previous chest pain symptomatology.  She has a history of hypertension and has been on Lopressor 25 g twice a day and lisinopril 10 mg daily.  She is on how you Purinol for gout.  She has GERD which is been controlled with Protonix 40 mg daily.  She has been on Lipitor 20 mg for hyperlipidemia.  When I  saw her in 2016 she had begun to notice left-sided chest and arm discomfort which may have had some similar characteristics than her chest pain prior to surgery. She underwent a nuclear perfusion study on 03/25/2015 which revealed normal perfusion.  Ejection fraction was 67%.  Her chest pain  resolved.    She saw Nell Range in June 2017 and because of recurrent somewhat atypical chest pain.  She underwent a repeat nuclear stress test which was done on 01/12/2016 and remained normal.  She has a history of asthma and is was started on  Advair.  She also has a history of obstructive sleep apnea and [employer] as her DME company.  Her last CPAP machine was in 2014.    When I saw her in May 2018, blood pressure was improved with titration of her lisinopril.  HEENT in the emergency room in June 2018 with left upper chest and shoulder pain which was felt to be musculoskeletal rather than cardiac in etiology.  Her ECG was on change.  In August 2018.  An echo Doppler study showed an EF of 55-60%.  Grade 1 diastolic dysfunction.  There ascending aortic diameter was 39 mm.  PA pressure was 25 mmHg.    She was seen by Almyra Deforest, PAC in March 2019 and at that time had complaints of intermittent chest discomfort for several months. Her chest pain had atypical features. She was referred for nuclear perfusion study which was done on October 02, 2017 and was low risk which showed normal perfusion and function. EF was 63%. He had repeat laboratory which showed a cholesterol of 170, triglycerides 86, HDL 56, LDL 97. When I previously saw her she was not using CPAP consistently.   I saw her in September 2019 which time she was using CPAP in excess of 90% of the nights and her sleeping was markedly improved. Her DME company was [employer] and they have been brought out by [employer].Whenlast saw her, her machine was still functioning. She subsequently was seen  virtuallyby HaoMeng, PACin May 2020at which time her machine was broken.  I saw her for telemedicine evaluation on February 10, 2019. She was in need of a new machine and metcriteria for a new wireless machine. She admits to fatigue and at times have noticed some episodes of vertigo. She apparently was found to be hypokalemic. She has noticed blood pressure lability. She has continued to be on lisinopril 40 mg daily, metoprolol 50 mg in the morning and 25 mg at night.  I saw her in October 2020.  She was able to obtain a new ResMed air sense 10 CPAP machine. This  was obtained through [employer] as her DME company since she had previously used [employer].She has felt well with her new CPAP machine. She typically goes to bed between 10 and 11 PM and wakes up at 5 AM. She is sleeping well. A download was obtained from September 26 through April 06, 2019. She is meeting compliance standards. Usage days was 90%. Average usage was 5 hours and 53 minutes. She has an auto pressure minimum of 10 with a maximum of 20. AHI is excellent at 0.9. Her 95th percentile average pressure is 12.3 with a maximum average pressure 13.2. There are days of increased mask leak.  During that evaluation I reviewed laboratory from September 2020 which showed an LDL of 94.  I recommended the addition of Zetia 10 mg to her atorvastatin regimen.  I saw her in a telemedicine evaluation on 09/25/2019. Over the previous 6 months,  she had felt well.  She denies any chest pain.  She has not been successful with weight loss and actually has gained 10 pounds.  She denies any swelling.  She has been intermittently using her CPAP and states that she needs new supplies including tubing, filters, as well as a mask.  A download was obtained from March 15 through September 23, 2019.  Usage was only 33% of days with average usage at 6 hours and 18 minutes.  AHI was 0.8 and her pressure settings were 10 to 20 cm  with her 95th percentile pressure 13.8  with a maximum average pressure at 14.8 cm.   She was evaluated in December 2021 and a telemedicine visit by Almyra Deforest.  At that time she was still experiencing some atypical chest pain felt to be related to emotional stress.  Presently, she denies any chest pain.  Her blood pressure has been elevated despite taking lisinopril 40 mg, metoprolol tartrate 50 mg in the morning and 25 mg in the evening.  She has been on Paxil for anxiety.  She continues to be on atorvastatin 40 mg and Zetia for hyperlipidemia.  She presents for evaluation.  Past Medical  History:  Diagnosis Date  . Asthma   . Depression   . Hypercholesteremia   . Hypertension   . Morbid obesity ([place])   . Nocturia   . Obstructive sleep apnea (adult) (pediatric) 12/27/2012   AHI 17.7 baseline, titrated to 6 cm , AHi now 2.4 - compliance   . OSA (obstructive sleep apnea)    w/ hypoventilation  . Sleep apnea     Past Surgical History:  Procedure Laterality Date  . BREAST EXCISIONAL BIOPSY Right   . CORONARY ARTERY BYPASS GRAFT N/A 10/20/2013   Procedure: CORONARY ARTERY BYPASS GRAFTING (CABG);  Surgeon: Ivin Poot, MD;  Location: [facility];  Service: Open Heart Surgery;  Laterality: N/A;  CABG x 3  . CYSTECTOMY    .  INTRAOPERATIVE TRANSESOPHAGEAL ECHOCARDIOGRAM N/A 10/20/2013   Procedure: INTRAOPERATIVE TRANSESOPHAGEAL ECHOCARDIOGRAM;  Surgeon: Ivin Poot, MD;  Location: [facility];  Service: Open Heart Surgery;  Laterality: N/A;  . KNEE SURGERY     chronic pain and limping since  . LEFT HEART CATHETERIZATION WITH CORONARY ANGIOGRAM N/A 10/16/2013   Procedure: LEFT HEART CATHETERIZATION WITH CORONARY ANGIOGRAM;  Surgeon: Troy Sine, MD;  Location: [facility] CATH LAB;  Service: Cardiovascular;  Laterality: N/A;  . PARTIAL HYSTERECTOMY    . TONSILLECTOMY      Allergies  Allergen Reactions  . Grass Pollen([id number]-O-R-T-Swt Vern) Itching  . Iohexol Other (See Comments)     Code: HIVES, Desc: premedicated 2 hrs prior with 241m soulumedrol IV, no reaction s/p injection, Onset Date: [id number]  . Penicillins Other (See Comments)    REACTION: pruritis  . Shellfish Allergy Other (See Comments)    Cannot have because of gout    Current Outpatient Medications  Medication Sig Dispense Refill  . acyclovir (ZOVIRAX) 200 MG capsule Take 200 mg by mouth every morning.    .Marland Kitchenalbuterol (VENTOLIN HFA) 108 (90 Base) MCG/ACT inhaler Inhale into the lungs every 6 (six) hours as needed for wheezing or shortness of breath.    . allopurinol (ZYLOPRIM) 100 MG tablet Take 100 mg by mouth 3 (three)  times daily.    .Marland KitchenamLODipine (NORVASC) 5 MG tablet Take 1 tablet (5 mg total) by mouth daily. 180 tablet 3  . aspirin 81 MG tablet Take 81 mg by mouth daily.     .Marland Kitchenatorvastatin (LIPITOR) 40 MG tablet Take 1 tablet (40 mg total) by mouth daily. 90 tablet 3  . buPROPion (WELLBUTRIN) 100 MG tablet Take 1 tablet (100 mg total) by mouth 2 (two) times daily. 60 tablet 2  . cholecalciferol (VITAMIN D) 1000 units tablet Take 1,000 Units by mouth daily.    . clotrimazole (LOTRIMIN) 1 % cream clotrimazole 1 % topical cream    . cromolyn (OPTICROM) 4 % ophthalmic solution Place 1 drop into both eyes 4 (four) times daily.  2  . ezetimibe (ZETIA) 10 MG tablet Take 1 tablet (10 mg total) by mouth daily. 90 tablet 3  . fluconazole (DIFLUCAN) 150 MG tablet Take 1 tablet by mouth daily as needed. When yeast infection is present  1  . fluticasone (FLOVENT HFA) 110 MCG/ACT inhaler Flovent HFA 110 mcg/actuation aerosol inhaler    . ketoconazole (NIZORAL) 2 % cream Apply topically 2 (two) times daily.    .Marland Kitchenlisinopril (ZESTRIL) 40 MG tablet TAKE 1 TABLET BY MOUTH EVERY DAY 90 tablet 0  . meclizine (ANTIVERT) 25 MG tablet meclizine 25 mg tablet  Take 1 tablet 3 times a day by oral route as needed for 10 days.    . meloxicam (MOBIC) 15 MG tablet Take 15 mg by mouth as needed.     . metoprolol tartrate (LOPRESSOR) 25 MG tablet TAKE 2 TABLETS IN THE MORNING AND 1 TABLET AT NIGHT 270 tablet 3  . metroNIDAZOLE (METROGEL) 0.75 % vaginal gel metronidazole 0.75 % vaginal gel    . nitroGLYCERIN (NITROSTAT) 0.4 MG SL tablet PLACE 1 TABLET UNDER THE TONGUE EVERY 5 MINUTES AS NEEDED FOR CHEST PAIN. 25 tablet 3  . pantoprazole (PROTONIX) 40 MG tablet Take 1 tablet (40 mg total) by mouth 2 (two) times daily. 60 tablet 6  . PARoxetine (PAXIL) 20 MG tablet Take 1 tablet (20 mg total) by mouth every morning. 30 tablet 2   No current facility-administered  medications for this visit.    Social History   Socioeconomic History  .  Marital status: Divorced    Spouse name: Not on file  . Number of children: Not on file  . Years of education: 25  . Highest education level: Not on file  Occupational History    Employer: UNEMPLOYED    Comment: history of shift work  Tobacco Use  . Smoking status: Former Smoker    Years: 15.00    Types: Cigarettes  . Smokeless tobacco: Never Used  . Tobacco comment: Quit 2005  Vaping Use  . Vaping Use: Never used  Substance and Sexual Activity  . Alcohol use: No  . Drug use: No  . Sexual activity: Never  Other Topics Concern  . Not on file  Social History Narrative   Patient lives at home alone.    Social Determinants of Health   Financial Resource Strain: High Risk  . Difficulty of Paying Living Expenses: Hard  Food Insecurity: Food Insecurity Present  . Worried About Charity fundraiser in the Last Year: Sometimes true  . Ran Out of Food in the Last Year: Sometimes true  Transportation Needs: No Transportation Needs  . Lack of Transportation (Medical): No  . Lack of Transportation (Non-Medical): No  Physical Activity: Not on file  Stress: Stress Concern Present  . Feeling of Stress : Very much  Social Connections: Not on file  Intimate Partner Violence: Not on file    Family History  Problem Relation Age of Onset  . Heart attack Mother 53       died of MI  . Heart attack Father 5       died of MI  . Cancer Sister        double mastectomy  . Heart attack Sister        2 sisters in their 71's with MI/stents  . Breast cancer Sister 70  . Breast cancer Paternal Aunt   . Breast cancer Cousin     ROS General: Negative; No fevers, chills, or night sweats; positive for obesity HEENT: Negative; No changes in vision or hearing, sinus congestion, difficulty swallowing Pulmonary: Negative; No cough, wheezing, shortness of breath, hemoptysis Cardiovascular: See HPI:  GI: Negative; No nausea, vomiting, diarrhea, or abdominal pain GU: Negative; No dysuria,  hematuria, or difficulty voiding Musculoskeletal: Negative; no myalgias, joint pain, or weakness Hematologic: Negative; no easy bruising, bleeding Rheumatologic: Positive for gout Endocrine: Negative; no heat/cold intolerance; no diabetes, Neuro: Negative; no changes in balance, headaches Skin: Negative; No rashes or skin lesions Psychiatric: Positive for anxiety Sleep: Positive for obstructive sleep apnea No snoring,  daytime sleepiness, hypersomnolence, bruxism, restless legs, hypnogognic hallucinations. Other comprehensive 14 point system review is negative   Physical Exam BP (!) 142/90 (BP Location: Left Arm, Patient Position: Sitting)   Pulse 62   Ht 5' 8" (1.727 m)   Wt 229 lb 6.4 oz (104.1 kg)   SpO2 95%   BMI 34.88 kg/m    Repeat blood pressure by me 132/82  Wt Readings from Last 3 Encounters:  09/13/20 229 lb 6.4 oz (104.1 kg)  03/03/20 234 lb (106.1 kg)  02/04/20 229 lb 12.8 oz (104.2 kg)   General: Alert, oriented, no distress.  Skin: normal turgor, no rashes, warm and dry HEENT: Normocephalic, atraumatic. Pupils equal round and reactive to light; sclera anicteric; extraocular muscles intact;  Nose without nasal septal hypertrophy Mouth/Parynx benign; Mallinpatti scale 3 Neck: No JVD, no carotid bruits; normal carotid  upstroke Lungs: clear to ausculatation and percussion; no wheezing or rales Chest wall: without tenderness to palpitation Heart: PMI not displaced, RRR, s1 s2 normal, 1/6 systolic murmur, no diastolic murmur, no rubs, gallops, thrills, or heaves Abdomen: soft, nontender; no hepatosplenomehaly, BS+; abdominal aorta nontender and not dilated by palpation. Back: no CVA tenderness Pulses 2+ Musculoskeletal: full range of motion, normal strength, no joint deformities Extremities: no clubbing cyanosis or edema, Homan's sign negative  Neurologic: grossly nonfocal; Cranial nerves grossly wnl Psychologic: Normal mood and affect   ECG (independently read  by me): NSR at 62; nonspecific T wave abnormality  October 2020 ECG (independently read by me): Normal sinus rhythm at 65 bpm.  Nonspecific T changes.  Normal intervals.  No ectopy.  November 2018 ECG (independently read by me): Normal sinus rhythm at 67 bpm.  Normal intervals.  Nondiagnostic T changes.  May 2018 ECG (independently read by me): normal sinus rhythm at 65 bpm.  Nonspecific T changes.  Normal intervals.  No ectopy.  January 2018 ECG (independently read by me): Normal sinus rhythm at 63 bpm.  No ectopy.  Nonspecific T changes.  Poor anterior R-wave progression.  March 19, 2015 ECG (independently read by me): Normal sinus rhythm at 60 bpm.  Nonspecific T changes.   PriorECG (independently read by me): Normal sinus rhythm at 65 bpm.  Nonspecific T-wave changes V1 and V2.  Normal intervals.  LABS:  BMP Latest Ref Rng & Units 12/26/2019 02/18/2019 11/19/2018  Glucose 65 - 99 mg/dL 69 69 75  BUN 8 - 27 mg/dL _0 Creatinine 0.57 - 1.00 mg/dL 0.79 0.87 0.88  BUN/Creat Ratio 12 - 28 10(L) 13 10(L)  Sodium 134 - 144 mmol/L 146(H) 142 142  Potassium 3.5 - 5.2 mmol/L 3.9 4.0 3.7  Chloride 96 - 106 mmol/L 106 101 102  CO2 20 - 29 mmol/L _1 Calcium 8.7 - 10.3 mg/dL 9.4 8.9 9.3   Hepatic Function Latest Ref Rng & Units 12/26/2019 02/18/2019 04/03/2018  Total Protein 6.0 - 8.5 g/dL 7.1 6.9 6.6  Albumin 3.8 - 4.8 g/dL 4.1 4.1 4.0  AST 0 - 40 IU/L _2 ALT 0 - 32 IU/L _3 Alk Phosphatase 48 - 121 IU/L 196(H) 184(H) 193(H)  Total Bilirubin 0.0 - 1.2 mg/dL 0.5 0.5 0.4  Bilirubin, Direct 0.00 - 0.40 mg/dL - - -    CBC Latest Ref Rng & Units 12/26/2019 02/18/2019 10/22/2018  WBC 3.4 - 10.8 x10E3/uL 6.5 6.3 8.7  Hemoglobin 11.1 - 15.9 g/dL 15.1 14.9 13.5  Hematocrit 34.0 - 46.6 % 44.5 43.8 41.1  Platelets 150 - 450 x10E3/uL 240 221 214    BNP    Component Value Date/Time   BNP 148.4 (H) 11/30/2016 1750    ProBNP    Component Value Date/Time   PROBNP 41.4  10/15/2013 0802   Lipid Panel     Component Value Date/Time   CHOL 125 12/26/2019 1600   TRIG 66 12/26/2019 1600   HDL 47 12/26/2019 1600   CHOLHDL 2.7 12/26/2019 1600   CHOLHDL 2.8 07/07/2016 1501   VLDL 16 07/07/2016 1501   LDLCALC 64 12/26/2019 1600     RADIOLOGY: No results found.   IMPRESSION:  1. Essential hypertension   2. Coronary artery disease involving coronary bypass graft of native heart without angina pectoris   3. Hx of CABG   4. OSA on CPAP   5. Hyperlipidemia with target LDL less than  70   6. Moderate obesity     ASSESSMENT AND PLAN: Amber Wong is a 65 year-old female who was found to have severe coronary obstructive disease at diagnostic catheterization in May 2015 and underwent CABG revascularization surgery 3 to her left coronary system.  Her RCA was not bypassed.  She developed left-sided and chest discomfort and  was concerned that this may be similar to her prior discomfor in 2016. A Lexiscan Myoview study revealed normal perfusion.  She underwent an additional nuclear study for recurrent chest pain in August 2017 which again remained normal.  Due to recurrent chest pain symptomatology a repeat nuclear study  in April 2019 again showed normal perfusion without scar or ischemia.  She has a history of hypertension.  Over the past several office visits her medications have been adjusted for more optimal blood pressure control.  Her blood pressure today is elevated at 142/92 on repeat by me despite taking lisinopril 40 mg in addition to metoprolol tartrate 50 mg in the morning and 25 mg at night.  I am adding amlodipine 5 mg to her medical regimen which will be helpful both for blood pressure control as well as potential anti-ischemic benefit.  She is not having any recurrent anginal symptomatology now almost 7 years status post CABG revascularization.  She continues to be on atorvastatin 40 mg and Zetia 10 mg for hyperlipidemia.  LDL cholesterol in July  2021 was 64.  She has not been very compliant with CPAP and a download from March 3 through September 10, 2020 showed an excellent AHI at 1.2 with her AutoSet pressure range at 10 to 20 cm with a 95th percentile pressure 12.7 and maximum average pressure of 13.4.  However, compliance was poor with only 11 of 30 days of use with average use of 5 hours and 25 minutes.  I discussed the importance of using CPAP on a daily basis since it is treating her sleep apnea and discussed optimal sleep duration at least 7 to 8 hours if at all possible with CPAP therapy.  I will see her in 6 months for reevaluation or sooner as needed   Troy Sine, MD, [facility]  09/18/2020 5:23 PM

## 2020-09-18 ENCOUNTER — Encounter: Payer: Self-pay | Admitting: Cardiovascular Disease

## 2020-10-16 ENCOUNTER — Other Ambulatory Visit: Payer: Self-pay | Admitting: Cardiovascular Disease

## 2020-10-22 ENCOUNTER — Other Ambulatory Visit: Payer: Self-pay | Admitting: Cardiovascular Disease

## 2020-11-17 ENCOUNTER — Other Ambulatory Visit: Payer: Self-pay

## 2020-11-17 ENCOUNTER — Encounter (HOSPITAL_COMMUNITY): Payer: Self-pay | Admitting: Psychiatry

## 2020-11-17 ENCOUNTER — Telehealth (INDEPENDENT_AMBULATORY_CARE_PROVIDER_SITE_OTHER): Payer: Medicaid Other | Admitting: Psychiatry

## 2020-11-17 DIAGNOSIS — F330 Major depressive disorder, recurrent, mild: Secondary | ICD-10-CM | POA: Diagnosis not present

## 2020-11-17 DIAGNOSIS — F411 Generalized anxiety disorder: Secondary | ICD-10-CM | POA: Diagnosis not present

## 2020-11-17 MED ORDER — PAROXETINE HCL 30 MG PO TABS: 30.0000 mg | ORAL_TABLET | ORAL | 2 refills | Status: DC

## 2020-11-17 MED ORDER — HYDROXYZINE HCL 10 MG PO TABS: 10.0000 mg | ORAL_TABLET | Freq: Three times a day (TID) | ORAL | 2 refills | Status: DC | PRN

## 2020-11-17 MED ORDER — BUPROPION HCL 100 MG PO TABS: 100.0000 mg | ORAL_TABLET | Freq: Two times a day (BID) | ORAL | 2 refills | Status: DC

## 2020-11-17 NOTE — Progress Notes (Signed)
[facility] MD/PA/NP OP Progress Note Virtual Visit via Video Note  I connected with Amber Wong on 11/17/20 at 10:30 AM EDT by a video enabled telemedicine application and verified that I am speaking with the correct person using two identifiers.  Location: Patient: Home Provider: Clinic   I discussed the limitations of evaluation and management by telemedicine and the availability of in person appointments. The patient expressed understanding and agreed to proceed.  I provided 30 minutes of non-face-to-face time during this encounter.    11/17/2020 12:40 PM [name]  MRN:  [id number]  Chief Complaint:  "I just want to work. Its stressing me"  HPI: 65 year old female seen today for follow up psychiatric evaluation. She has a psychiatric history of Anxiety, Depression, and Alcohol use (in remission). She is currently managed on Paxil 20 mg daily and Wellbutrin 100 mg twice daily. She notes that her medications are somewhat effective in managing her psychiatric conditions.  Today she is well groomed, pleasant, cooperative, engaged in conversation, and maintained eye contact. She informed provider that she is depressed and has financial needs.  She notes that she wants to work but lacks the confidence to work.  Sfe informed Probation officer that she prefers to work from home however is unaware of how to go about that.  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  Patient informed Probation officer that she feels like she forced herself and others in his left empty.  She informed Probation officer that since her last visit her anxiety and her depression has worsened.  Provider conducted a GAD-7 and patient scored a 15, at her last visit she scored a 7.  Provider also conducted a PHQ-9 and patient scored a 17, at her last visit she scored a 9.  She endorses adequate sleep and appetite. Today she denies VAH/SI/HI/, mania, or paranoia.  Patient also informed Probation officer that she is concerned about  her housing.  She notes that she may have to leave her apartment complex due to the increased rent.  Patient asked writer if she knew of any programs to help with low income housing.  Provider informed patient of [facility] however informed her that she would have to call him for details on housing.  Today she is agreeable to increasing Paxil 20 mg to 30 mg to help manage anxiety and depression.  She is also agreeable to starting hydroxyzine 10 mg 3 times daily to help manage anxiety.  She will follow-up with outpatient counseling for therapy.  No other concerns noted at this time.   Visit Diagnosis:    ICD-10-CM   1. Mild episode of recurrent major depressive disorder (HCC)  F33.0 buPROPion (WELLBUTRIN) 100 MG tablet    PARoxetine (PAXIL) 30 MG tablet    Ambulatory referral to Social Work  2. Generalized anxiety disorder  F41.1 PARoxetine (PAXIL) 30 MG tablet    Ambulatory referral to Social Work    Past Psychiatric History: Anxiety, Depression, and Alcohol use (in remission).  Past Medical History:  Past Medical History:  Diagnosis Date  . Asthma   . Depression   . Hypercholesteremia   . Hypertension   . Morbid obesity ([place])   . Nocturia   . Obstructive sleep apnea (adult) (pediatric) 12/27/2012   AHI 17.7 baseline, titrated to 6 cm , AHi now 2.4 - compliance   . OSA (obstructive sleep apnea)    w/ hypoventilation  . Sleep apnea     Past Surgical History:  Procedure Laterality Date  .  BREAST EXCISIONAL BIOPSY Right   . CORONARY ARTERY BYPASS GRAFT N/A 10/20/2013   Procedure: CORONARY ARTERY BYPASS GRAFTING (CABG);  Surgeon: Ivin Poot, MD;  Location: [facility];  Service: Open Heart Surgery;  Laterality: N/A;  CABG x 3  . CYSTECTOMY    . INTRAOPERATIVE TRANSESOPHAGEAL ECHOCARDIOGRAM N/A 10/20/2013   Procedure: INTRAOPERATIVE TRANSESOPHAGEAL ECHOCARDIOGRAM;  Surgeon: Ivin Poot, MD;  Location: [facility];  Service: Open Heart Surgery;  Laterality: N/A;  . KNEE SURGERY      chronic pain and limping since  . LEFT HEART CATHETERIZATION WITH CORONARY ANGIOGRAM N/A 10/16/2013   Procedure: LEFT HEART CATHETERIZATION WITH CORONARY ANGIOGRAM;  Surgeon: Troy Sine, MD;  Location: [facility] C[facility];  Service: Cardiovascular;  Laterality: N/A;  . PARTIAL HYSTERECTOMY    . TONSILLECTOMY      Family Psychiatric History: Notes that her mother may have been depressed and anxious  Family History:  Family History  Problem Relation Age of Onset  . Heart attack Mother 5       died of MI  . Heart attack Father 64       died of MI  . Cancer Sister        double mastectomy  . Heart attack Sister        2 sisters in their 24's with MI/stents  . Breast cancer Sister 76  . Breast cancer Paternal Aunt   . Breast cancer Cousin     Social History:  Social History   Socioeconomic History  . Marital status: Divorced    Spouse name: Not on file  . Number of children: Not on file  . Years of education: 80  . Highest education level: Not on file  Occupational History    Employer: UNEMPLOYED    Comment: history of shift work  Tobacco Use  . Smoking status: Former Smoker    Years: 15.00    Types: Cigarettes  . Smokeless tobacco: Never Used  . Tobacco comment: Quit 2005  Vaping Use  . Vaping Use: Never used  Substance and Sexual Activity  . Alcohol use: No  . Drug use: No  . Sexual activity: Never  Other Topics Concern  . Not on file  Social History Narrative   Patient lives at home alone.    Social Determinants of Health   Financial Resource Strain: High Risk  . Difficulty of Paying Living Expenses: Hard  Food Insecurity: Food Insecurity Present  . Worried About Charity fundraiser in the Last Year: Sometimes true  . Ran Out of Food in the Last Year: Sometimes true  Transportation Needs: No Transportation Needs  . Lack of Transportation (Medical): No  . Lack of Transportation (Non-Medical): No  Physical Activity: Not on file  Stress: Stress Concern Present   . Feeling of Stress : Very much  Social Connections: Not on file    Allergies:  Allergies  Allergen Reactions  . Grass Pollen(K-O-R-T-Swt Vern) Itching  . Iohexol Other (See Comments)     Code: HIVES, Desc: premedicated 2 hrs prior with 200mg  soulumedrol IV, no reaction s/p injection, Onset Date: [id number]   . Penicillins Other (See Comments)    REACTION: pruritis  . Shellfish Allergy Other (See Comments)    Cannot have because of gout    Metabolic Disorder Labs: Lab Results  Component Value Date   HGBA1C 5.9 (H) 10/17/2013   MPG 123 (H) 10/17/2013   MPG 114 10/17/2013   No results found for: PROLACTIN Lab Results  Component Value Date   CHOL 125 12/26/2019   TRIG 66 12/26/2019   HDL 47 12/26/2019   CHOLHDL 2.7 12/26/2019   VLDL 16 07/07/2016   LDLCALC 64 12/26/2019   LDLCALC 94 02/18/2019   Lab Results  Component Value Date   TSH 1.180 12/26/2019   TSH 0.800 02/18/2019    Therapeutic Level Labs: No results found for: LITHIUM No results found for: VALPROATE No components found for:  CBMZ  Current Medications: Current Outpatient Medications  Medication Sig Dispense Refill  . hydrOXYzine (ATARAX/VISTARIL) 10 MG tablet Take 1 tablet (10 mg total) by mouth 3 (three) times daily as needed. 90 tablet 2  . acyclovir (ZOVIRAX) 200 MG capsule Take 200 mg by mouth every morning.    Marland Kitchen albuterol (VENTOLIN HFA) 108 (90 Base) MCG/ACT inhaler Inhale into the lungs every 6 (six) hours as needed for wheezing or shortness of breath.    . allopurinol (ZYLOPRIM) 100 MG tablet Take 100 mg by mouth 3 (three) times daily.    Marland Kitchen amLODipine (NORVASC) 5 MG tablet Take 1 tablet (5 mg total) by mouth daily. 180 tablet 3  . aspirin 81 MG tablet Take 81 mg by mouth daily.     Marland Kitchen atorvastatin (LIPITOR) 40 MG tablet TAKE 1 TABLET BY MOUTH EVERY DAY 90 tablet 3  . buPROPion (WELLBUTRIN) 100 MG tablet Take 1 tablet (100 mg total) by mouth 2 (two) times daily. 60 tablet 2  . cholecalciferol  (VITAMIN D) 1000 units tablet Take 1,000 Units by mouth daily.    . cromolyn (OPTICROM) 4 % ophthalmic solution Place 1 drop into both eyes 4 (four) times daily.  2  . ezetimibe (ZETIA) 10 MG tablet TAKE 1 TABLET BY MOUTH EVERY DAY 90 tablet 3  . fluconazole (DIFLUCAN) 150 MG tablet Take 1 tablet by mouth daily as needed. When yeast infection is present  1  . fluticasone (FLOVENT HFA) 110 MCG/ACT inhaler Flovent HFA 110 mcg/actuation aerosol inhaler    . ketoconazole (NIZORAL) 2 % cream Apply topically 2 (two) times daily.    Marland Kitchen lisinopril (ZESTRIL) 40 MG tablet TAKE 1 TABLET BY MOUTH EVERY DAY 90 tablet 0  . meclizine (ANTIVERT) 25 MG tablet meclizine 25 mg tablet  Take 1 tablet 3 times a day by oral route as needed for 10 days.    . meloxicam (MOBIC) 15 MG tablet Take 15 mg by mouth as needed.     . metoprolol tartrate (LOPRESSOR) 25 MG tablet TAKE 2 TABLETS IN THE MORNING AND 1 TABLET AT NIGHT 270 tablet 3  . metroNIDAZOLE (METROGEL) 0.75 % vaginal gel metronidazole 0.75 % vaginal gel    . nitroGLYCERIN (NITROSTAT) 0.4 MG SL tablet PLACE 1 TABLET UNDER THE TONGUE EVERY 5 MINUTES AS NEEDED FOR CHEST PAIN. 25 tablet 3  . pantoprazole (PROTONIX) 40 MG tablet Take 1 tablet (40 mg total) by mouth 2 (two) times daily. 60 tablet 6  . PARoxetine (PAXIL) 30 MG tablet Take 1 tablet (30 mg total) by mouth every morning. 30 tablet 2   No current facility-administered medications for this visit.     Musculoskeletal: Strength & Muscle Tone: Unable to assess due to telehealth visit [place]: Unable to assess due to telehealth visit Patient leans: N/A  Psychiatric Specialty Exam: Review of Systems  There were no vitals taken for this visit.There is no height or weight on file to calculate BMI.  General Appearance: Well Groomed  Eye Contact:  Good  Speech:  Clear  and Coherent and Normal Rate  Volume:  Normal  Mood:  Anxious and Depressed  Affect:  Appropriate and Congruent  Thought Process:   Coherent, Goal Directed and Linear  Orientation:  Full (Time, Place, and Person)  Thought Content: WDL and Logical   Suicidal Thoughts:  No  Homicidal Thoughts:  No  Memory:  Immediate;   Good Recent;   Good Remote;   Good  Judgement:  Good  Insight:  Good  Psychomotor Activity:  Normal  Concentration:  Concentration: Good and Attention Span: Good  Recall:  Good  Fund of Knowledge: Good  Language: Good  Akathisia:  No  Handed:  Right  AIMS (if indicated): Not done  Assets:  Communication Skills Desire for Improvement Financial Resources/Insurance Housing Physical Health Social Support  ADL's:  Intact  Cognition: WNL  Sleep:  Good   Screenings: GAD-7   Flowsheet Row Video Visit from 11/17/2020 in [facility] Video Visit from [facility]/20/2020 in [facility]  Total GAD-7 Score 15 8    PHQ2-9   Flowsheet Row Video Visit from 11/17/2020 in [facility] Video Visit from 08/20/2020 in [facility]  PHQ-2 Total Score 4 1  PHQ-9 Total Score 17 9    Flowsheet Row Video Visit from 08/20/2020 in [facility] No Risk       Assessment and Plan: Patient endorses increased anxiety and depression due to life stressors. Today she is agreeable to increasing Paxil 20 mg to 30 mg to help manage anxiety and depression.  She is also agreeable to starting hydroxyzine 10 mg 3 times daily to help manage anxiety.  1. Mild episode of recurrent major depressive disorder (HCC)  Continue- buPROPion (WELLBUTRIN) 100 MG tablet; Take 1 tablet (100 mg total) by mouth 2 (two) times daily.  Dispense: 60 tablet; Refill: 2 Increase- PARoxetine (PAXIL) 30 MG tablet; Take 1 tablet (30 mg total) by mouth every morning.  Dispense: 30 tablet; Refill: 2 Restart therapy- Ambulatory referral to Social Work  2. Generalized anxiety disorder  Increase- PARoxetine  (PAXIL) 30 MG tablet; Take 1 tablet (30 mg total) by mouth every morning.  Dispense: 30 tablet; Refill: 2 Restart therapy- Ambulatory referral to Social Work  Follow-up in 3 months Follow-up for therapy Salley Slaughter, NP 11/17/2020, 12:40 PM

## 2020-11-29 ENCOUNTER — Telehealth: Payer: Self-pay | Admitting: Cardiovascular Disease

## 2020-11-29 NOTE — Telephone Encounter (Signed)
Vaccines documented.  Left message for pt to call back.

## 2020-11-29 NOTE — Telephone Encounter (Signed)
Pt send a my chart message, she just wanted Dr Claiborne Billings to have the following info and update it in her chart  I had my first COVID shot at [place]  [place] [facility] [place] on 05/04/20 ( [place]) 05/25/20 ( [place]  [id number]) 11/23/20 ([facility]  [id number])   She would also has some questions about the  Pneumococcal Vaccine.  She would like a call back [phone]

## 2020-11-30 NOTE — Telephone Encounter (Signed)
Attempted to contact patient in regards to her question about vaccines. Left call back number.

## 2020-12-02 NOTE — Telephone Encounter (Signed)
Called patient, unable to leave message.  Will remove from triage as we have attempted 3 times to contact patient.

## 2020-12-03 ENCOUNTER — Telehealth: Payer: Self-pay | Admitting: *Deleted

## 2020-12-03 NOTE — Telephone Encounter (Signed)
Spoke with pt, she is reports a pin like sensation in her chest that can last 1 minute. They can happen on a daily basis. She can get this same pain in her left arm when she over eats as well. This will also happen if she drinks too much. She reports a lot of belching. She has not taking her pantoprazole in some time. Encouraged patient to take pantoprazole twice daily as before and see if that helps her symptoms. She will call back if her symptoms do not improve.

## 2020-12-15 ENCOUNTER — Other Ambulatory Visit (HOSPITAL_COMMUNITY): Payer: Self-pay | Admitting: Psychiatry

## 2020-12-15 DIAGNOSIS — F330 Major depressive disorder, recurrent, mild: Secondary | ICD-10-CM

## 2020-12-15 DIAGNOSIS — F411 Generalized anxiety disorder: Secondary | ICD-10-CM

## 2020-12-16 ENCOUNTER — Telehealth (HOSPITAL_COMMUNITY): Payer: Self-pay | Admitting: Psychiatry

## 2020-12-16 NOTE — Telephone Encounter (Signed)
Patient called apologizing for "missing appointment on yesterday July 6". Writer informed patient that she has not missed an appointment. Patient stated that she received notice that she had an appointment scheduled to discuss medications with provider. Patient requested for provider to be made aware that she'd like to discuss her medications sooner than next appointment when she is available. Writer informed patient this message will be given to provider.

## 2020-12-16 NOTE — Telephone Encounter (Signed)
Provider attempted to call patient 4 times today without success.  Provider left a message informing patient of walk-in hours on Mondays and Tuesdays between the hours of 8 AM and 10 AM.  Provider also informed patient that she can give the clinic a call back if she has other questions or concerns.

## 2021-01-13 ENCOUNTER — Ambulatory Visit (HOSPITAL_COMMUNITY): Payer: Medicaid Other | Admitting: Clinical

## 2021-01-20 ENCOUNTER — Other Ambulatory Visit: Payer: Self-pay

## 2021-01-20 ENCOUNTER — Ambulatory Visit (HOSPITAL_COMMUNITY): Payer: Medicaid Other | Admitting: Clinical

## 2021-02-10 ENCOUNTER — Ambulatory Visit: Payer: Medicaid Other | Admitting: Cardiovascular Disease

## 2021-02-16 ENCOUNTER — Other Ambulatory Visit: Payer: Self-pay

## 2021-02-16 ENCOUNTER — Telehealth (HOSPITAL_COMMUNITY): Payer: Medicaid Other | Admitting: Psychiatry

## 2021-03-13 ENCOUNTER — Other Ambulatory Visit: Payer: Self-pay | Admitting: Cardiovascular Disease

## 2021-04-21 ENCOUNTER — Telehealth: Payer: Self-pay | Admitting: *Deleted

## 2021-04-21 NOTE — Telephone Encounter (Signed)
Called patient in regards to her message needing an appointment   Patient states she wanted to reschedule missed appointment. Symptoms have not changed since last visit .   Chest pain   can occur all day or  occasionally.  She states she has blood pressure issues bt do not have  any readings     Patient states she use  nitroglycerin but she is vague in describing  symptoms.   RN  informed patient in regards to getting assistance for home . She will have to speak to primary . She states she is aware. Patient aware to bring all medications and reading of blood pressure and heart rate to the appointment.

## 2021-04-21 NOTE — Telephone Encounter (Signed)
[name] N routed conversation to [facility] [employer] (8:49 AM)   Amber Wong "Ms. Amber Wong"  Patient Appointment Schedule Request Pool Yesterday (8:23 AM)   I would also like to request a flu shot and get my home care help . Due to the fact that I am no longer with [employer]. I only have direct Medicaid, and I'm 65 yrs. old without a sturdy health plan. I need my home care. I've also had a lot of signs of heart attacks in my shoulder and strike as I have discussed with you all before. Blood pressure as well hearing in my head my pressure, that is. Same as pain and n my left arm and results from medication bothering my organs. .  Thanks and have a blessed and Happy Holiday.     Amber Wong "Ms. Amber Wong"  Patient Appointment Schedule Request Pool Yesterday (8:12 AM)   Appointment Request From: Amber Wong   With Provider: Shelva Majestic, MD [facility]]   Preferred Date Range: Any   Preferred Times: Any Time   Reason for visit: Office Visit   Comments: Blood pressure, yearly or and appt. that I cancelled  for my checkup.

## 2021-05-10 ENCOUNTER — Telehealth: Payer: Self-pay | Admitting: Cardiovascular Disease

## 2021-05-10 NOTE — Telephone Encounter (Signed)
Patient states she has a throat sore, headache, body aches and has been coughing.

## 2021-05-10 NOTE — Telephone Encounter (Signed)
Spoke to patient she stated she had to cancel her appointment with [facility] 12/2 due to having body aches,sore throat and a frequent productive cough.Advised she needs to see PCP.Keep appointment scheduled with [facility] 07/07/21 at 8:00 am.

## 2021-05-13 ENCOUNTER — Ambulatory Visit: Payer: Self-pay | Admitting: Cardiovascular Disease

## 2021-05-29 ENCOUNTER — Other Ambulatory Visit: Payer: Self-pay | Admitting: Cardiovascular Disease

## 2021-06-13 ENCOUNTER — Other Ambulatory Visit: Payer: Self-pay | Admitting: Cardiovascular Disease

## 2021-06-17 ENCOUNTER — Other Ambulatory Visit: Payer: Self-pay | Admitting: Cardiovascular Disease

## 2021-06-20 ENCOUNTER — Telehealth: Payer: Self-pay | Admitting: Cardiovascular Disease

## 2021-06-20 NOTE — Telephone Encounter (Signed)
Spoke with patient of Dr. Claiborne Billings. She states she got a message on her phone or MyChart(?) inquiring if she wanted HIV testing(?) -- difficult to follow. Explained that our office does not test for HIV, no labs have been done by our office since 02/2020 and there has been no recent outbound communication to her regarding labs. She said her phone has been messed up - unsure if this is spam?   She also mentioned something about a psycho-somatic dx on her chart - advised she discuss with [facility] provider. She asked if her 1/26 visit with Isaac Laud PA could be virtual but it is scheduled for "chest discomfort" so she was advised to come in for this appointment. No further assistance needed.

## 2021-06-20 NOTE — Telephone Encounter (Signed)
Patient calling to speak with a nurse states she received a note stating that she wanted her family to know she has HIV positive or something, she states no one told her this and she would not like her family to know. She states she takes something for herpes, because it was transferred to her from her parents and she is a high risk patient. She states she also has not been using her sleep apnea machine. She states the office has also been concerned about her BP, but her machine has not been working so she has not been checking it. She states she also could not use her phone but it just got it fixed. She states she is good and taking care of herself, but does not appreciate the note she received from the office. She says she also has not been able to use her mychart.

## 2021-07-07 ENCOUNTER — Ambulatory Visit: Payer: Self-pay | Admitting: Physician Assistant

## 2021-07-14 ENCOUNTER — Other Ambulatory Visit: Payer: Self-pay | Admitting: Cardiovascular Disease

## 2021-07-18 ENCOUNTER — Telehealth: Payer: Self-pay | Admitting: Cardiovascular Disease

## 2021-07-18 MED ORDER — LISINOPRIL 40 MG PO TABS: 40.0000 mg | ORAL_TABLET | Freq: Every day | ORAL | 1 refills | Status: DC

## 2021-07-18 NOTE — Telephone Encounter (Signed)
°*  STAT* If patient is at the pharmacy, call can be transferred to refill team.   1. Which medications need to be refilled? (please list name of each medication and dose if known)  lisinopril (ZESTRIL) 40 MG tablet  2. Which pharmacy/location (including street and city if local pharmacy) is medication to be sent to? [id number] - [place], [place] - [address].   3. Do they need a 30 day or 90 day supply? 90 with refills  Patient is scheduled to see APP 08/15/21

## 2021-07-18 NOTE — Telephone Encounter (Signed)
Refill sent to the pharmacy 

## 2021-08-17 ENCOUNTER — Telehealth: Payer: Self-pay | Admitting: *Deleted

## 2021-08-17 NOTE — Telephone Encounter (Signed)
? ?  Pre-operative Risk Assessment  ?  ?Patient Name: Amber Wong  ?DOB: 10/25/1955 ?MRN: [id number]  ? ?  ? ?Request for Surgical Clearance   ? ?Procedure:   Vitrectomy,internal limiting membrane peel with gas fluid exchange  right eye for a macular hole  ? ?Date of Surgery:  Clearance 09/12/21                              ?   ?Surgeon:  Dr Sherlynn Stalls ?Surgeon's Group or Practice Name:  [facility] ?Phone number:  [phone] ?Fax number:  [phone] ?  ?Type of Clearance Requested:   ?- Medical  ?- Pharmacy:  Hold Aspirin 7 days ?  ?Type of Anesthesia:   under monitored and controlled anesthesia ?  ?Additional requests/questions:  Please advise surgeon/provider what medications should be held. ? ?Signed, ?Trixie Dredge V   ?08/17/2021, 5:23 PM   ?

## 2021-08-18 ENCOUNTER — Ambulatory Visit: Payer: Self-pay | Admitting: Physician Assistant

## 2021-08-18 NOTE — Telephone Encounter (Signed)
Lorenda Peck "Ms. Nadalee" 66 year old female is requesting preoperative cardiac evaluation for vitrectomy.  She was last seen in the clinic on 09/13/2020.  During that time she denied chest pain.  Her blood pressure was noted to be elevated at 142/90.  Amlodipine was added to her medication regimen.  She reported compliance with her CPAP.  Follow-up was planned for 6 months. ? ?Her PMH includes coronary artery disease status post CABG, HTN, HLD, OSA on CPAP, and diastolic CHF. ? ? ?May her aspirin be held prior to her procedure? ? ?Thank you for your help.  Please direct your response to [facility] preop will. ? ?Jossie Ng. Lucus Lambertson NP-C ? ?  ?08/18/2021, 8:07 AM ?[place] ?[address] ?Office [phone] Fax [phone] ? ? ?

## 2021-08-24 ENCOUNTER — Telehealth: Payer: Self-pay | Admitting: Cardiovascular Disease

## 2021-08-24 NOTE — Telephone Encounter (Signed)
Pt would like to change tomorrows appt to a telephone visit... please advise  ?

## 2021-08-24 NOTE — Telephone Encounter (Signed)
disregard

## 2021-08-25 ENCOUNTER — Encounter: Payer: Self-pay | Admitting: Physician Assistant

## 2021-08-25 ENCOUNTER — Ambulatory Visit (INDEPENDENT_AMBULATORY_CARE_PROVIDER_SITE_OTHER): Payer: Medicare Other | Admitting: Physician Assistant

## 2021-08-25 ENCOUNTER — Other Ambulatory Visit: Payer: Self-pay

## 2021-08-25 VITALS — BP 134/82 | HR 63 | Ht 66.0 in | Wt 214.8 lb

## 2021-08-25 DIAGNOSIS — I25810 Atherosclerosis of coronary artery bypass graft(s) without angina pectoris: Secondary | ICD-10-CM | POA: Diagnosis not present

## 2021-08-25 DIAGNOSIS — I10 Essential (primary) hypertension: Secondary | ICD-10-CM | POA: Diagnosis not present

## 2021-08-25 DIAGNOSIS — M25561 Pain in right knee: Secondary | ICD-10-CM

## 2021-08-25 DIAGNOSIS — E785 Hyperlipidemia, unspecified: Secondary | ICD-10-CM

## 2021-08-25 DIAGNOSIS — Z01810 Encounter for preprocedural cardiovascular examination: Secondary | ICD-10-CM

## 2021-08-25 DIAGNOSIS — Z9989 Dependence on other enabling machines and devices: Secondary | ICD-10-CM

## 2021-08-25 DIAGNOSIS — G4733 Obstructive sleep apnea (adult) (pediatric): Secondary | ICD-10-CM

## 2021-08-25 MED ORDER — AMLODIPINE BESYLATE 2.5 MG PO TABS: 2.5000 mg | ORAL_TABLET | Freq: Every day | ORAL | 3 refills | Status: DC

## 2021-08-25 MED ORDER — LISINOPRIL 40 MG PO TABS: 40.0000 mg | ORAL_TABLET | Freq: Every day | ORAL | 3 refills | Status: DC

## 2021-08-25 MED ORDER — EZETIMIBE 10 MG PO TABS: 10.0000 mg | ORAL_TABLET | Freq: Every day | ORAL | 3 refills | Status: DC

## 2021-08-25 NOTE — Progress Notes (Signed)
?Cardiology Office Note:   ? ?Date:  08/26/2021  ? ?ID:  Amber Wong, DOB 06-19-1955, MRN [id number] ? ?PCP:  Everardo Beals, NP ?  ?[facility] Providers ?Cardiologist:  Shelva Majestic, MD    ? ?Referring MD: Everardo Beals, NP  ? ?Chief Complaint  ?Patient presents with  ? Follow-up  ?  Seen for Dr. Claiborne Billings  ? ? ?History of Present Illness:   ? ?Amber Wong is a 66 y.o. 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  Cardiac catheterization in 2015 revealed hyperdynamic LV function, 80% distal left main, 50% proximal LAD lesion, normal left circumflex and RCA vessel.  She underwent CABG x3 by Dr. Prescott Gum with LIMA-LAD, SVG-OM and SVG-diagonal. Echocardiogram in August 2018 showed EF 55 to 60%, grade 1 DD, ascending aorta measuring 39 mm, PA peak pressure 25 mmHg.  Myoview obtained in April 2019 was low risk with EF of 63%.  Patient was last seen by Dr. Claiborne Billings on 09/13/2021 at which time she was doing well. ? ?Patient presents today for follow-up.  She denies any chest discomfort or worsening dyspnea.  She is using her CPAP machine most of the time but not every day.  She says her CPAP machine causes her to have more sneezing.  For some reason she is no longer on amlodipine, blood pressures borderline elevated today, I will resume amlodipine at a lower dose of 2.5 mg daily.  We do not have any of her lab work since 2021, she says she just recently had lab work done at her PCPs office, we will request.  We will refill her cardiac medications including amlodipine, Lipitor, Zetia, lisinopril and metoprolol.  She does occasionally still have chest discomfort, however she described as a transient numbness and tingling sensation in the chest, since symptom only lasted a second each time, this is unlikely to be cardiac.  She is cleared to proceed with a vitrectomy procedure.  She will need to hold  aspirin for 7 days prior to the procedure and restart as soon as possible afterward at the surgeon's discretion.  She is also having significant right knee pain that is worse when she bend her right knee.  Her right knee also appears to be slightly more swollen compared to the left knee.  I recommended referral to orthopedic surgery to see if this could be arthritis issue.  Overall, she is doing well from the cardiac perspective and can follow-up in 6 months by Dr. Claiborne Billings. ? ? ?Past Medical History:  ?Diagnosis Date  ? Asthma   ? Depression   ? Hypercholesteremia   ? Hypertension   ? Morbid obesity ([place])   ? Nocturia   ? Obstructive sleep apnea (adult) (pediatric) 12/27/2012  ? AHI 17.7 baseline, titrated to 6 cm , AHi now 2.4 - compliance   ? OSA (obstructive sleep apnea)   ? w/ hypoventilation  ? Sleep apnea   ? ? ?Past Surgical History:  ?Procedure Laterality Date  ? BREAST EXCISIONAL BIOPSY Right   ? CORONARY ARTERY BYPASS GRAFT N/A 10/20/2013  ? Procedure: CORONARY ARTERY BYPASS GRAFTING (CABG);  Surgeon: Ivin Poot, MD;  Location: [place];  Service: Open Heart Surgery;  Laterality: N/A;  CABG x 3  ? CYSTECTOMY    ? INTRAOPERATIVE TRANSESOPHAGEAL ECHOCARDIOGRAM N/A 10/20/2013  ? Procedure: INTRAOPERATIVE TRANSESOPHAGEAL ECHOCARDIOGRAM;  Surgeon: Tharon Aquas Trigt,  MD;  Location: [facility] OR;  Service: Open Heart Surgery;  Laterality: N/A;  ? KNEE SURGERY    ? chronic pain and limping since  ? LEFT HEART CATHETERIZATION WITH CORONARY ANGIOGRAM N/A 10/16/2013  ? Procedure: LEFT HEART CATHETERIZATION WITH CORONARY ANGIOGRAM;  Surgeon: Troy Sine, MD;  Location: [facility] CATH LAB;  Service: Cardiovascular;  Laterality: N/A;  ? PARTIAL HYSTERECTOMY    ? TONSILLECTOMY    ? ? ?Current Medications: ?Current Meds  ?Medication Sig  ? acyclovir (ZOVIRAX) 200 MG capsule Take 200 mg by mouth every morning.  ? albuterol (VENTOLIN HFA) 108 (90 Base) MCG/ACT inhaler Inhale into the lungs every 6 (six) hours as needed for wheezing or  shortness of breath.  ? allopurinol (ZYLOPRIM) 100 MG tablet Take 100 mg by mouth 3 (three) times daily.  ? aspirin 81 MG tablet Take 81 mg by mouth daily.   ? atorvastatin (LIPITOR) 40 MG tablet TAKE 1 TABLET BY MOUTH EVERY DAY  ? buPROPion (WELLBUTRIN) 100 MG tablet Take 1 tablet (100 mg total) by mouth 2 (two) times daily.  ? cholecalciferol (VITAMIN D) 1000 units tablet Take 1,000 Units by mouth daily.  ? cromolyn (OPTICROM) 4 % ophthalmic solution Place 1 drop into both eyes 4 (four) times daily.  ? fluconazole (DIFLUCAN) 150 MG tablet Take 1 tablet by mouth daily as needed. When yeast infection is present  ? fluticasone (FLOVENT HFA) 110 MCG/ACT inhaler Flovent HFA 110 mcg/actuation aerosol inhaler  ? hydrOXYzine (ATARAX/VISTARIL) 10 MG tablet TAKE 1 TABLET BY MOUTH THREE TIMES A DAY AS NEEDED  ? ketoconazole (NIZORAL) 2 % cream Apply topically 2 (two) times daily.  ? meclizine (ANTIVERT) 25 MG tablet meclizine 25 mg tablet ? Take 1 tablet 3 times a day by oral route as needed for 10 days.  ? meloxicam (MOBIC) 15 MG tablet Take 15 mg by mouth as needed.   ? metoprolol tartrate (LOPRESSOR) 25 MG tablet TAKE 2 TABLETS IN THE MORNING AND 1 TABLET AT NIGHT  ? metroNIDAZOLE (METROGEL) 0.75 % vaginal gel metronidazole 0.75 % vaginal gel  ? nitroGLYCERIN (NITROSTAT) 0.4 MG SL tablet PLACE 1 TABLET UNDER THE TONGUE EVERY 5 MINUTES AS NEEDED FOR CHEST PAIN.  ? pantoprazole (PROTONIX) 40 MG tablet Take 1 tablet (40 mg total) by mouth 2 (two) times daily.  ? PARoxetine (PAXIL) 30 MG tablet TAKE 1 TABLET BY MOUTH EVERY MORNING  ? [DISCONTINUED] ezetimibe (ZETIA) 10 MG tablet TAKE 1 TABLET BY MOUTH EVERY DAY  ? [DISCONTINUED] lisinopril (ZESTRIL) 40 MG tablet Take 1 tablet (40 mg total) by mouth daily. **Keep appointment for future refill**  ?  ? ?Allergies:   Grass pollen([id number]-t-swt vern), Iohexol, Penicillins, and Shellfish allergy  ? ?Social History  ? ?Socioeconomic History  ? Marital status: Divorced  ?  Spouse name:  Not on file  ? Number of children: Not on file  ? Years of education: 64  ? Highest education level: Not on file  ?Occupational History  ?  Employer: UNEMPLOYED  ?  Comment: history of shift work  ?Tobacco Use  ? Smoking status: Former  ?  Years: 15.00  ?  Types: Cigarettes  ? Smokeless tobacco: Never  ? Tobacco comments:  ?  Quit 2005  ?Vaping Use  ? Vaping Use: Never used  ?Substance and Sexual Activity  ? Alcohol use: No  ? Drug use: No  ? Sexual activity: Never  ?Other Topics Concern  ? Not on file  ?Social History Narrative  ?  Patient lives at home alone.   ? ?Social Determinants of Health  ? ?Financial Resource Strain: Not on file  ?Food Insecurity: Not on file  ?Transportation Needs: Not on file  ?Physical Activity: Not on file  ?Stress: Not on file  ?Social Connections: Not on file  ?  ? ?Family History: ?The patient's family history includes Breast cancer in her cousin and paternal aunt; Breast cancer (age of onset: 95) in her sister; Cancer in her sister; Heart attack in her sister; Heart attack (age of onset: 59) in her mother; Heart attack (age of onset: 19) in her father. ? ?ROS:   ?Please see the history of present illness.    ? All other systems reviewed and are negative. ? ?EKGs/Labs/Other Studies Reviewed:   ? ?The following studies were reviewed today: ? ?Echo 01/12/2017 ?LV EF: 55% -   60%  ? ?-------------------------------------------------------------------  ?Indications:      Chest pain (R07.89).  ? ?-------------------------------------------------------------------  ?History:   PMH:   Dyspnea.  Coronary artery disease.  Congestive  ?heart failure.  Chronic obstructive pulmonary disease.  Risk  ?factors:  OSA. ETOH. Hypertension. Obese. Dyslipidemia.  ? ?-------------------------------------------------------------------  ?Study Conclusions  ? ?- Left ventricle: The cavity size was normal. Wall thickness was  ?  normal. Systolic function was normal. The estimated ejection  ?  fraction was in  the range of 55% to 60%. Although no diagnostic  ?  regional wall motion abnormality was identified, this possibility  ?  cannot be completely excluded on the basis of this study. Doppler  ?  parameters are con

## 2021-08-25 NOTE — Patient Instructions (Addendum)
Medication Instructions:  ?START Amlodipine 2.5 mg once daily ? ?*If you need a refill on your cardiac medications before your next appointment, please call your pharmacy* ? ? ?Lab Work: ?None ordered ?If you have labs (blood work) drawn today and your tests are completely normal, you will receive your results only by: ?MyChart Message (if you have MyChart) OR ?A paper copy in the mail ?If you have any lab test that is abnormal or we need to change your treatment, we will call you to review the results. ? ? ?Testing/Procedures: ?None ordered ? ? ?Follow-Up: ?At [facility], you and your health needs are our priority.  As part of our continuing mission to provide you with exceptional heart care, we have created designated Provider Care Teams.  These Care Teams include your primary Cardiologist (physician) and Advanced Practice Providers (APPs -  Physician Assistants and Nurse Practitioners) who all work together to provide you with the care you need, when you need it. ? ?We recommend signing up for the patient portal called "MyChart".  Sign up information is provided on this After Visit Summary.  MyChart is used to connect with patients for Virtual Visits (Telemedicine).  Patients are able to view lab/test results, encounter notes, upcoming appointments, etc.  Non-urgent messages can be sent to your provider as well.   ?To learn more about what you can do with MyChart, go to [email or web address].   ? ?Your next appointment:   ?6 month(s) ? ?The format for your next appointment:   ?In Person ? ?Provider:   ?Shelva Majestic, MD { ? ? ?Other Instructions ?A referral has been placed to [facility]. Phone number [phone] ?You may hold aspirin 7 days prior to the eye surgery and then restart according to the physician ? ?

## 2021-08-26 ENCOUNTER — Encounter: Payer: Self-pay | Admitting: Physician Assistant

## 2021-08-29 NOTE — Telephone Encounter (Signed)
[place] with [facility] is following up, requesting our faxed recommendation. ? ?Fax#: [phone] ?

## 2021-08-30 NOTE — Telephone Encounter (Signed)
This is an update in regard to clearance. Our pre op provider is awaiting on Dr. Evette Georges input in regard to ASA, see previous notes. Once we have clearance complete we will be sure the fax clearance notes to your office.  ?

## 2021-09-01 NOTE — Telephone Encounter (Signed)
Will fax over clearance notes to requesting office. Requesting office will need to reach out to the pt with exactly how many days they want the pt to gold the ASA. See recommendations per Dr. Claiborne Billings and pre op provider Avanell Shackleton.  ?

## 2021-09-01 NOTE — Telephone Encounter (Signed)
? ?  Primary Cardiologist: Shelva Majestic, MD ? ?Chart reviewed as part of pre-operative protocol coverage. Given past medical history and time since last visit, based on ACC/AHA guidelines, Amber Wong would be at acceptable risk for the planned procedure without further cardiovascular testing.  ? ?Her aspirin may be held for 5-7 days prior to her procedure.  Please resume as soon as hemostasis is achieved. ? ?I will route this recommendation to the requesting party via Epic fax function and remove from pre-op pool. ? ?Please call with questions. ? ?Jossie Ng. Virdie Penning NP-C ? ?  ?09/01/2021, 10:41 AM ?[place] ?[address] ?Office [phone] Fax [phone] ? ? ? ? ?

## 2021-09-27 ENCOUNTER — Other Ambulatory Visit: Payer: Self-pay | Admitting: Cardiovascular Disease

## 2021-10-13 ENCOUNTER — Other Ambulatory Visit: Payer: Self-pay | Admitting: Cardiovascular Disease

## 2021-10-13 DIAGNOSIS — R0789 Other chest pain: Secondary | ICD-10-CM

## 2021-11-02 ENCOUNTER — Other Ambulatory Visit: Payer: Self-pay | Admitting: *Deleted

## 2021-11-02 DIAGNOSIS — M810 Age-related osteoporosis without current pathological fracture: Secondary | ICD-10-CM

## 2021-11-04 ENCOUNTER — Telehealth: Payer: Self-pay | Admitting: Cardiovascular Disease

## 2021-11-04 DIAGNOSIS — R0789 Other chest pain: Secondary | ICD-10-CM

## 2021-11-04 NOTE — Telephone Encounter (Signed)
*  STAT* If patient is at the pharmacy, call can be transferred to refill team.   1. Which medications need to be refilled? (please list name of each medication and dose if known) Amlodipine, Atorvastatin,  Ezetimibe, metoprolol, and Nitroglycerin  2. Which pharmacy/location (including street and city if local pharmacy) is medication to be sent to? [facility]  3. Do they need a 30 day or 90 day supply? 90 days and refills

## 2021-11-09 MED ORDER — EZETIMIBE 10 MG PO TABS: 10.0000 mg | ORAL_TABLET | Freq: Every day | ORAL | 2 refills | Status: DC

## 2021-11-09 MED ORDER — NITROGLYCERIN 0.4 MG SL SUBL: 0.4000 mg | SUBLINGUAL_TABLET | SUBLINGUAL | 2 refills | Status: DC | PRN

## 2021-11-09 MED ORDER — ATORVASTATIN CALCIUM 40 MG PO TABS: 40.0000 mg | ORAL_TABLET | Freq: Every day | ORAL | 2 refills | Status: DC

## 2021-11-09 MED ORDER — METOPROLOL TARTRATE 25 MG PO TABS: ORAL_TABLET | ORAL | 2 refills | Status: DC

## 2021-11-09 MED ORDER — AMLODIPINE BESYLATE 2.5 MG PO TABS: 2.5000 mg | ORAL_TABLET | Freq: Every day | ORAL | 2 refills | Status: DC

## 2021-11-09 NOTE — Telephone Encounter (Signed)
Refills has been sent to the correct pharmacy.

## 2021-11-19 ENCOUNTER — Other Ambulatory Visit: Payer: Self-pay | Admitting: Physician Assistant

## 2021-11-19 ENCOUNTER — Other Ambulatory Visit: Payer: Self-pay | Admitting: Cardiovascular Disease

## 2022-01-05 ENCOUNTER — Other Ambulatory Visit: Payer: Self-pay | Admitting: *Deleted

## 2022-01-13 ENCOUNTER — Other Ambulatory Visit: Payer: Self-pay | Admitting: *Deleted

## 2022-01-13 DIAGNOSIS — Z1231 Encounter for screening mammogram for malignant neoplasm of breast: Secondary | ICD-10-CM

## 2022-01-25 ENCOUNTER — Ambulatory Visit
Admission: RE | Admit: 2022-01-25 | Discharge: 2022-01-25 | Disposition: A | Discharge status: Home / Self Care | Payer: Medicare Other | Source: Ambulatory Visit | Attending: *Deleted | Admitting: *Deleted

## 2022-01-25 DIAGNOSIS — Z1231 Encounter for screening mammogram for malignant neoplasm of breast: Secondary | ICD-10-CM

## 2022-01-26 ENCOUNTER — Other Ambulatory Visit: Payer: Self-pay | Admitting: *Deleted

## 2022-01-26 DIAGNOSIS — N6320 Unspecified lump in the left breast, unspecified quadrant: Secondary | ICD-10-CM

## 2022-02-09 ENCOUNTER — Ambulatory Visit
Admission: RE | Admit: 2022-02-09 | Discharge: 2022-02-09 | Disposition: A | Discharge status: Home / Self Care | Payer: Medicare Other | Source: Ambulatory Visit | Attending: *Deleted | Admitting: *Deleted

## 2022-02-09 DIAGNOSIS — N6320 Unspecified lump in the left breast, unspecified quadrant: Secondary | ICD-10-CM

## 2022-06-26 ENCOUNTER — Other Ambulatory Visit: Payer: Self-pay | Admitting: Physician Assistant

## 2022-07-18 ENCOUNTER — Ambulatory Visit
Admission: RE | Admit: 2022-07-18 | Discharge: 2022-07-18 | Disposition: A | Discharge status: Home / Self Care | Payer: 59 | Source: Ambulatory Visit | Attending: *Deleted | Admitting: *Deleted

## 2022-07-18 DIAGNOSIS — M810 Age-related osteoporosis without current pathological fracture: Secondary | ICD-10-CM

## 2022-08-03 ENCOUNTER — Encounter: Payer: Self-pay | Admitting: Cardiovascular Disease

## 2022-08-03 ENCOUNTER — Ambulatory Visit: Payer: 59 | Attending: Cardiovascular Disease | Admitting: Cardiovascular Disease

## 2022-08-03 VITALS — BP 118/73 | HR 73 | Ht 66.0 in | Wt 224.4 lb

## 2022-08-03 DIAGNOSIS — E785 Hyperlipidemia, unspecified: Secondary | ICD-10-CM

## 2022-08-03 DIAGNOSIS — E668 Other obesity: Secondary | ICD-10-CM

## 2022-08-03 DIAGNOSIS — I10 Essential (primary) hypertension: Secondary | ICD-10-CM | POA: Diagnosis not present

## 2022-08-03 DIAGNOSIS — I25810 Atherosclerosis of coronary artery bypass graft(s) without angina pectoris: Secondary | ICD-10-CM

## 2022-08-03 DIAGNOSIS — Z951 Presence of aortocoronary bypass graft: Secondary | ICD-10-CM | POA: Diagnosis not present

## 2022-08-03 DIAGNOSIS — G4733 Obstructive sleep apnea (adult) (pediatric): Secondary | ICD-10-CM

## 2022-08-03 DIAGNOSIS — R0789 Other chest pain: Secondary | ICD-10-CM

## 2022-08-03 LAB — CBC

## 2022-08-03 MED ORDER — NITROGLYCERIN 0.4 MG SL SUBL: 0.4000 mg | SUBLINGUAL_TABLET | SUBLINGUAL | 2 refills | Status: DC | PRN

## 2022-08-03 NOTE — Patient Instructions (Addendum)
Medication Instructions:   Not needed *If you need a refill on your cardiac medications before your next appointment, please call your pharmacy*   Lab Work: HgBA1c CMP LIPID TSH LPa If you have labs (blood work) drawn today and your tests are completely normal, you will receive your results only by: [place] (if you have MyChart) OR A paper copy in the mail If you have any lab test that is abnormal or we need to change your treatment, we will call you to review the results.   Testing/Procedures: Not needed   Follow-Up: At [facility], you and your health needs are our priority.  As part of our continuing mission to provide you with exceptional heart care, we have created designated Provider Care Teams.  These Care Teams include your primary Cardiologist (physician) and Advanced Practice Providers (APPs -  Physician Assistants and Nurse Practitioners) who all work together to provide you with the care you need, when you need it.     Your next appointment:   12 month(s)  The format for your next appointment:   In Person  Provider:   Shelva Majestic, MD

## 2022-08-03 NOTE — Progress Notes (Signed)
dt ?

## 2022-08-03 NOTE — Progress Notes (Signed)
Patient ID: RASCHELLE MANJARRES, female   DOB: 14-Oct-1955, 67 y.o.   MRN: [id number]       HPI: NAYLANIE MARENTES is a 67 y.o. female who presents to the office today for a 26 month cardiology evaluation.   Ms.Manessa Holderness has a history of morbid obesity, hypertension, hyperlipidemia, and strong family history for CAD.  She was admitted to [facility] hospital in May 2015 with chest pain described as a "elephant "sitting on her chest.  I performed cardiac catheterization on 10/16/2013 and she was found to have hyperdynamic LV function and evidence for severe 80% distal left main stenosis with 50% diffuse proximal LAD stenosis and otherwise normal circumflex and RCA vessels.  She underwent CABG x3 revascularization surgery by Dr. Myles Lipps and had a LIMA placed to her LAD, SVG to obtuse marginal, and SVG to her diagonal vessel with Endo vascular vein harvesting from her right thigh.   She was seen by Tarri Fuller in October 2015 with complaints involving her left leg, knee, and great toe.  When I saw her in February 2016 she noted marked improvement in her previous chest pain symptomatology.  She has a history of hypertension and has been on Lopressor 25 g twice a day and lisinopril 10 mg daily.  She is on how you Purinol for gout.  She has GERD which is been controlled with Protonix 40 mg daily.  She has been on Lipitor 20 mg for hyperlipidemia.  When I  saw her in 2016 she had begun to notice left-sided chest and arm discomfort which may have had some similar characteristics than her chest pain prior to surgery. She underwent a nuclear perfusion study on 03/25/2015 which revealed normal perfusion.  Ejection fraction was 67%.  Her chest pain  resolved.    She saw Nell Range in June 2017 and because of recurrent somewhat atypical chest pain.  She underwent a repeat nuclear stress test which was done on 01/12/2016 and remained normal.  She has a history of asthma and is was  started on Advair.  She also has a history of obstructive sleep apnea and [employer] as her DME company.  Her last CPAP machine was in 2014.    When I saw her in May 2018, blood pressure was improved with titration of her lisinopril.  HEENT in the emergency room in June 2018 with left upper chest and shoulder pain which was felt to be musculoskeletal rather than cardiac in etiology.  Her ECG was on change.  In August 2018.  An echo Doppler study showed an EF of 55-60%.  Grade 1 diastolic dysfunction.  There ascending aortic diameter was 39 mm.  PA pressure was 25 mmHg.    She was seen by Almyra Deforest, PAC in March 2019 and at that time had complaints of intermittent chest discomfort for several months.  Her chest pain had atypical features.  She was referred for nuclear perfusion study which was done on October 02, 2017 and was low risk which showed normal perfusion and function.  EF was 63%.  He had repeat laboratory which showed a cholesterol of 170, triglycerides 86, HDL 56, LDL 97.  When I previously saw her she was not using CPAP consistently.   I  saw her in September 2019 which time she was using CPAP in excess of 90% of the nights and her sleeping was markedly improved.  Her DME company was [employer] and they have been brought out by [employer].  When last saw her, her machine was still functioning.  She subsequently was seen virtually by Almyra Deforest, [facility] in May 2020 at which time her machine was broken.     I saw her for telemedicine evaluation on February 10, 2019.  She was  in need of a new machine and met criteria for a new wireless machine.  She admits to fatigue and at times have noticed some episodes of vertigo.  She apparently was found to be hypokalemic.  She has noticed blood pressure lability.  She has continued to be on lisinopril 40 mg daily, metoprolol 50 mg in the morning and 25 mg at night.     I saw her in October 2020.  She was able to obtain a new ResMed air sense 10 CPAP  machine.  This was obtained through [employer] as her DME company since she had previously used [employer].  She has felt well with her new CPAP machine.  She typically goes to bed between 10 and 11 PM and wakes up at 5 AM.  She is sleeping well.  A download was obtained from September 26 through April 06, 2019.  She is meeting compliance standards.  Usage days was 90%.  Average usage was 5 hours and 53 minutes.  She has an auto pressure minimum of 10 with a maximum of 20.  AHI is excellent at 0.9.  Her 95th percentile average pressure is 12.3 with a maximum average pressure 13.2.  There are days of increased mask leak.  During that evaluation I reviewed laboratory from September 2020 which showed an LDL of 94.  I recommended the addition of Zetia 10 mg to her atorvastatin regimen.   I saw her in a telemedicine evaluation on 09/25/2019. Over the previous 6 months,  she had felt well.  She denies any chest pain.  She has not been successful with weight loss and actually has gained 10 pounds.  She denies any swelling.  She has been intermittently using her CPAP and states that she needs new supplies including tubing, filters, as well as a mask.  A download was obtained from March 15 through September 23, 2019.  Usage was only 33% of days with average usage at 6 hours and 18 minutes.  AHI was 0.8 and her pressure settings were 10 to 20 cm  with her 95th percentile pressure 13.8  with a maximum average pressure at 14.8 cm.   She was evaluated in December 2021 and a telemedicine visit by Almyra Deforest, PA.  At that time she was still experiencing some atypical chest pain felt to be related to emotional stress.  I last saw her on September 13, 2020 at which time she denied any chest pain.    Her blood pressure has been elevated despite taking lisinopril 40 mg, metoprolol tartrate 50 mg in the morning and 25 mg in the evening.  She has been on Paxil for anxiety.  She continues to be on atorvastatin 40 mg and Zetia for  hyperlipidemia.  With her blood pressure elevation I added amlodipine 5 mg both for blood pressure and potential anti-ischemic benefit.  She was was on CPAP which is managed by Dr. Brett Fairy.  Presently, she denies any chest pain or shortness of breath.  She admits to vague sharp aches intermittently.  She has difficulty with her balance and walks using a cane.  She had eye surgery in May 2023 and tolerated it well.  She has not had recent laboratory.  She continues to use CPAP intermittently, followed by Dr. Brett Fairy.  She presents for follow-up cardiology evaluation.   Past Surgical History:  Procedure Laterality Date   BREAST EXCISIONAL BIOPSY Right    CORONARY ARTERY BYPASS GRAFT N/A 10/20/2013   Procedure: CORONARY ARTERY BYPASS GRAFTING (CABG);  Surgeon: Ivin Poot, MD;  Location: [place];  Service: Open Heart Surgery;  Laterality: N/A;  CABG x 3   CYSTECTOMY     INTRAOPERATIVE TRANSESOPHAGEAL ECHOCARDIOGRAM N/A 10/20/2013   Procedure: INTRAOPERATIVE TRANSESOPHAGEAL ECHOCARDIOGRAM;  Surgeon: Ivin Poot, MD;  Location: [place];  Service: Open Heart Surgery;  Laterality: N/A;   KNEE SURGERY     chronic pain and limping since   LEFT HEART CATHETERIZATION WITH CORONARY ANGIOGRAM N/A 10/16/2013   Procedure: LEFT HEART CATHETERIZATION WITH CORONARY ANGIOGRAM;  Surgeon: Troy Sine, MD;  Location: [facility] CATH LAB;  Service: Cardiovascular;  Laterality: N/A;   PARTIAL HYSTERECTOMY     TONSILLECTOMY      Allergies  Allergen Reactions   Grass Pollen([id number]-O-[id number] Vern) Itching   Iohexol Other (See Comments)     Code: HIVES, Desc: premedicated 2 hrs prior with '200mg'$  soulumedrol IV, no reaction s/p injection, Onset Date: [id number]    Penicillins Other (See Comments)    REACTION: pruritis   Shellfish Allergy Other (See Comments)    Cannot have because of gout    Current Outpatient Medications  Medication Sig Dispense Refill   acyclovir (ZOVIRAX) 200 MG capsule Take 200 mg by mouth every  morning.     allopurinol (ZYLOPRIM) 100 MG tablet Take 100 mg by mouth 3 (three) times daily.     amLODipine (NORVASC) 2.5 MG tablet Take 1 tablet (2.5 mg total) by mouth daily. 90 tablet 0   aspirin 81 MG tablet Take 81 mg by mouth daily.      atorvastatin (LIPITOR) 40 MG tablet Take 1 tablet (40 mg total) by mouth daily. 90 tablet 2   buPROPion (WELLBUTRIN) 100 MG tablet Take 1 tablet (100 mg total) by mouth 2 (two) times daily. 60 tablet 2   ezetimibe (ZETIA) 10 MG tablet Take 1 tablet (10 mg total) by mouth daily. Please keep scheduled appointment with cardiologist 90 tablet 0   fluticasone (FLOVENT HFA) 110 MCG/ACT inhaler Flovent HFA 110 mcg/actuation aerosol inhaler     lisinopril (ZESTRIL) 40 MG tablet Take 1 tablet (40 mg total) by mouth daily. 90 tablet 3   meclizine (ANTIVERT) 25 MG tablet meclizine 25 mg tablet  Take 1 tablet 3 times a day by oral route as needed for 10 days.     metoprolol tartrate (LOPRESSOR) 25 MG tablet TAKE 2 TABLETS BY MOUTH IN THE MORNING AND 1 TABLET AT NIGHT 90 tablet 1   metroNIDAZOLE (METROGEL) 0.75 % vaginal gel metronidazole 0.75 % vaginal gel     pantoprazole (PROTONIX) 40 MG tablet Take 1 tablet (40 mg total) by mouth 2 (two) times daily. 60 tablet 6   PARoxetine (PAXIL) 20 MG tablet Take 20 mg by mouth daily.     albuterol (VENTOLIN HFA) 108 (90 Base) MCG/ACT inhaler Inhale into the lungs every 6 (six) hours as needed for wheezing or shortness of breath. (Patient not taking: Reported on 08/03/2022)     cholecalciferol (VITAMIN D) 1000 units tablet Take 1,000 Units by mouth daily. (Patient not taking: Reported on 08/03/2022)     cromolyn (OPTICROM) 4 % ophthalmic solution Place 1 drop into both eyes 4 (four) times daily. (Patient not taking: Reported on  08/03/2022)  2   fluconazole (DIFLUCAN) 150 MG tablet Take 1 tablet by mouth daily as needed. When yeast infection is present (Patient not taking: Reported on 08/03/2022)  1   hydrOXYzine (ATARAX/VISTARIL) 10  MG tablet TAKE 1 TABLET BY MOUTH THREE TIMES A DAY AS NEEDED (Patient not taking: Reported on 08/03/2022) 270 tablet 1   ketoconazole (NIZORAL) 2 % cream Apply topically 2 (two) times daily. (Patient not taking: Reported on 08/03/2022)     meloxicam (MOBIC) 15 MG tablet Take 15 mg by mouth as needed.  (Patient not taking: Reported on 08/03/2022)     nitroGLYCERIN (NITROSTAT) 0.4 MG SL tablet Place 1 tablet (0.4 mg total) under the tongue every 5 (five) minutes as needed for chest pain. 75 tablet 2   PARoxetine (PAXIL) 30 MG tablet TAKE 1 TABLET BY MOUTH EVERY MORNING (Patient not taking: Reported on 08/03/2022) 90 tablet 1   No current facility-administered medications for this visit.    Social History   Socioeconomic History   Marital status: Divorced    Spouse name: Not on file   Number of children: Not on file   Years of education: 12   Highest education level: Not on file  Occupational History    Employer: UNEMPLOYED    Comment: history of shift work  Tobacco Use   Smoking status: Former    Years: 15.00    Types: Cigarettes   Smokeless tobacco: Never   Tobacco comments:    Quit 2005  Vaping Use   Vaping Use: Never used  Substance and Sexual Activity   Alcohol use: No   Drug use: No   Sexual activity: Never  Other Topics Concern   Not on file  Social History Narrative   Patient lives at home alone.    Social Determinants of Health   Financial Resource Strain: High Risk (05/12/2020)   Overall Financial Resource Strain (CARDIA)    Difficulty of Paying Living Expenses: Hard  Food Insecurity: Food Insecurity Present (05/12/2020)   Hunger Vital Sign    Worried About Running Out of Food in the Last Year: Sometimes true    Ran Out of Food in the Last Year: Sometimes true  Transportation Needs: No Transportation Needs (05/12/2020)   PRAPARE - Hydrologist (Medical): No    Lack of Transportation (Non-Medical): No  Physical Activity: Not on file   Stress: Stress Concern Present (05/12/2020)   [place]    Feeling of Stress : Very much  Social Connections: Not on file  Intimate Partner Violence: Not on file    Family History  Problem Relation Age of Onset   Heart attack Mother 58       died of MI   Heart attack Father 27       died of MI   Cancer Sister        double mastectomy   Heart attack Sister        2 sisters in their 19's with MI/stents   Breast cancer Sister 13   Breast cancer Paternal Aunt    Breast cancer Cousin     ROS General: Negative; No fevers, chills, or night sweats; positive for obesity HEENT: Eye surgery May 2023; No changes in vision or hearing, sinus congestion, difficulty swallowing Pulmonary: Negative; No cough, wheezing, shortness of breath, hemoptysis Cardiovascular: See HPI:  GI: Negative; No nausea, vomiting, diarrhea, or abdominal pain GU: Negative; No dysuria, hematuria, or  difficulty voiding Musculoskeletal: Negative; no myalgias, joint pain, or weakness Hematologic: Negative; no easy bruising, bleeding Rheumatologic: Positive for gout Endocrine: Negative; no heat/cold intolerance; no diabetes, Neuro: Negative; no changes in balance, headaches Skin: Negative; No rashes or skin lesions Psychiatric: Positive for anxiety Sleep: Positive for obstructive sleep apnea No snoring,  daytime sleepiness, hypersomnolence, bruxism, restless legs, hypnogognic hallucinations. Other comprehensive 14 point system review is negative   Physical Exam BP 118/73 (BP Location: Left Arm, Patient Position: Sitting, Cuff Size: Large)   Pulse 73   Ht '5\' 6"'$  (1.676 m)   Wt 224 lb 6.4 oz (101.8 kg)   SpO2 98%   BMI 36.22 kg/m    Repeat blood pressure by me 112/70  Wt Readings from Last 3 Encounters:  08/03/22 224 lb 6.4 oz (101.8 kg)  08/25/21 214 lb 12.8 oz (97.4 kg)  09/13/20 229 lb 6.4 oz (104.1 kg)    General: Alert, oriented, no  distress.  Skin: normal turgor, no rashes, warm and dry HEENT: Normocephalic, atraumatic. Pupils equal round and reactive to light; sclera anicteric; extraocular muscles intact;  Nose without nasal septal hypertrophy Mouth/Parynx benign; Mallinpatti scale 3 Neck: No JVD, no carotid bruits; normal carotid upstroke Lungs: clear to ausculatation and percussion; no wheezing or rales Chest wall: without tenderness to palpitation Heart: PMI not displaced, RRR, s1 s2 normal, 1/6 systolic murmur, no diastolic murmur, no rubs, gallops, thrills, or heaves Abdomen: soft, nontender; no hepatosplenomehaly, BS+; abdominal aorta nontender and not dilated by palpation. Back: no CVA tenderness Pulses 2+ Musculoskeletal: full range of motion, normal strength, no joint deformities Extremities: no clubbing cyanosis or edema, Homan's sign negative  Neurologic: grossly nonfocal; Cranial nerves grossly wnl Psychologic: Normal mood and affect   August 03, 2022 ECG (independently read by me): NSR at 73, Nonspecific T wave abnormality  September 13, 2020 ECG (independently read by me): NSR at 62; nonspecific T wave abnormality  October 2020 ECG (independently read by me): Normal sinus rhythm at 65 bpm.  Nonspecific T changes.  Normal intervals.  No ectopy.  November 2018 ECG (independently read by me): Normal sinus rhythm at 67 bpm.  Normal intervals.  Nondiagnostic T changes.  May 2018 ECG (independently read by me): normal sinus rhythm at 65 bpm.  Nonspecific T changes.  Normal intervals.  No ectopy.  January 2018 ECG (independently read by me): Normal sinus rhythm at 63 bpm.  No ectopy.  Nonspecific T changes.  Poor anterior R-wave progression.  March 19, 2015 ECG (independently read by me): Normal sinus rhythm at 60 bpm.  Nonspecific T changes.   PriorECG (independently read by me): Normal sinus rhythm at 65 bpm.  Nonspecific T-wave changes V1 and V2.  Normal intervals.  LABS:     Latest Ref Rng & Units  08/03/2022   10:03 AM 12/26/2019    4:00 PM 02/18/2019   10:02 AM  BMP  Glucose 70 - 99 mg/dL 87  69  69   BUN 8 - 27 mg/dL '10  8  11   '$ Creatinine 0.57 - 1.00 mg/dL 0.97  0.79  0.87   BUN/Creat Ratio 12 - '28 10  10  13   '$ Sodium 134 - 144 mmol/L 144  146  142   Potassium 3.5 - 5.2 mmol/L 4.1  3.9  4.0   Chloride 96 - 106 mmol/L 103  106  101   CO2 20 - 29 mmol/L '27  26  23   '$ Calcium 8.7 - 10.3 mg/dL 9.7  9.4  8.9       Latest Ref Rng & Units 08/03/2022   10:03 AM 12/26/2019    4:00 PM 02/18/2019   10:02 AM  Hepatic Function  Total Protein 6.0 - 8.5 g/dL 6.9  7.1  6.9   Albumin 3.9 - 4.9 g/dL 4.2  4.1  4.1   AST 0 - 40 IU/L '23  30  19   '$ ALT 0 - 32 IU/L '19  28  18   '$ Alk Phosphatase 44 - 121 IU/L 211  196  184   Total Bilirubin 0.0 - 1.2 mg/dL 0.5  0.5  0.5        Latest Ref Rng & Units 08/03/2022   10:03 AM 12/26/2019    4:00 PM 02/18/2019   10:02 AM  CBC  WBC 3.4 - 10.8 x10E3/uL 6.5  6.5  6.3   Hemoglobin 11.1 - 15.9 g/dL 14.6  15.1  14.9   Hematocrit 34.0 - 46.6 % 45.0  44.5  43.8   Platelets 150 - 450 x10E3/uL 242  240  221     BNP    Component Value Date/Time   BNP 148.4 (H) 11/30/2016 1750    ProBNP    Component Value Date/Time   PROBNP 41.4 10/15/2013 0802   Lipid Panel     Component Value Date/Time   CHOL 124 08/03/2022 1003   TRIG 57 08/03/2022 1003   HDL 51 08/03/2022 1003   CHOLHDL 2.4 08/03/2022 1003   CHOLHDL 2.8 07/07/2016 1501   VLDL 16 07/07/2016 1501   LDLCALC 60 08/03/2022 1003     RADIOLOGY: DG BONE DENSITY (DXA)  Result Date: 07/18/2022 EXAM: DUAL X-RAY ABSORPTIOMETRY (DXA) FOR BONE MINERAL DENSITY IMPRESSION: Referring Physician:  Everardo Beals Your patient completed a bone mineral density test using GE Lunar iDXA system (analysis version: 16). Technologist: lmn PATIENT: Name: Asaiah, Milhous Patient ID: [id number] Birth Date: [birth year] Height: 66.0 in. Sex: Female Measured: 07/18/2022 Weight: 214.8 lbs. Indications: Albuterol,  Depression, Estrogen Deficient, Hysterectomy, Postmenopausal, Wellbutrin Fractures: NONE Treatments: Vitamin D (E933.5) ASSESSMENT: The BMD measured at Femur Neck Left is 1.067 g/cm2 with a T-score of 0.2. This patient is considered normal according to [facility]) criteria. The quality of the exam is limited by patient body habitus. The lumbar spine was excluded due to degenerative changes. Site Region Measured Date Measured Age YA BMD Significant CHANGE T-score DualFemur Neck Left 07/18/2022 66.2 0.2 1.067 g/cm2 DualFemur Total Mean 07/18/2022 66.2 0.8 1.111 g/cm2 Left Forearm Radius 33% 07[age]18/2022 66.2 1.0 0.965 g/cm2 World Health Organization [facility]) criteria for post-menopausal, Caucasian Women: Normal       T-score at or above -1 SD Osteopenia   T-score between -1 and -2.5 SD Osteoporosis T-score at or below -2.5 SD RECOMMENDATION: 1. All patients should optimize calcium and vitamin D intake. 2. Consider FDA-approved medical therapies in postmenopausal women and men aged 32 years and older, based on the following: a. A hip or vertebral (clinical or morphometric) fracture. b. T-score = -2.5 at the femoral neck or spine after appropriate evaluation to exclude secondary causes. c. Low bone mass (T-score between -1.0 and -2.5 at the femoral neck or spine) and a 10-year probability of a hip fracture = 3% or a 10-year probability of a major osteoporosis-related fracture = 20% based on the [place]-adapted WHO algorithm. d. Clinician judgment and/or patient preferences may indicate treatment for people with 10-year fracture probabilities above or below these levels. FOLLOW-UP: Patients with diagnosis of osteoporosis or  at high risk for fracture should have regular bone mineral density tests. Patients eligible for Medicare are allowed routine testing every 2 years. The testing frequency can be increased to one year for patients who have rapidly progressing disease, are receiving or discontinuing medical  therapy to restore bone mass, or have additional risk factors. I have reviewed this study and agree with the findings. [facility] Electronically Signed   By: Ammie Ferrier M.D.   On: 07/18/2022 10:38     IMPRESSION:  1. Coronary artery disease involving coronary bypass graft of native heart without angina pectoris   2. Hx of CABG   3. Essential hypertension   4. Hyperlipidemia LDL goal <70   5. Moderate obesity   6. Atypical chest pain   7. OSA on CPAP     ASSESSMENT AND PLAN: Ms.Ousmane-Alhassan is a 67 year-old female who was found to have severe coronary obstructive disease at diagnostic catheterization in May 2015 and underwent CABG revascularization surgery 3 to her left coronary system.  Her RCA was not bypassed.  She developed left-sided and chest discomfort and  was concerned that this may be similar to her prior discomfor in 2016. A Lexiscan Myoview study revealed normal perfusion.  She underwent an additional nuclear study for recurrent chest pain in August 2017 which again remained normal.  Due to recurrent chest pain symptomatology a repeat nuclear study  in April 2019 again showed normal perfusion without scar or ischemia.  She has a history of hypertension.  Over several office visits, her medications were adjusted and when last seen by me in April 2022 amlodipine 5 mg was added to her lisinopril 40 mg in addition to metoprolol tartrate 50 mg in the morning and 25 mg at night.  Her blood pressure today is stable and she is now on amlodipine 2.5 mg, lisinopril 40 mg, metoprolol tartrate 50 mg in the morning and 25 mg at night.  She is on atorvastatin 40 mg and Zetia 10 mg for hyperlipidemia.  She has not had recent laboratory.  She is on Wellbutrin in addition to Paxil for anxiety/depression.  She has not been very compliant with CPAP, followed by Dr. Brett Fairy.  I am recommending fasting laboratory be obtained we will schedule her for a comprehensive metabolic panel,  CBC, hemoglobin A1c, fasting lipid panel, and I also discussed with her obtaining an LP(a) which is an independent risk factor for CAD above and beyond her LDL cholesterol.  I will contact her regarding laboratory results and adjustments to her medical regimen if necessary.  She will follow-up with Everardo Beals, NP for her primary care.  I will see her in 1 year for reevaluation or sooner as needed.   Troy Sine, MD, [facility]  08/07/2022 1:09 PM

## 2022-08-04 LAB — COMPREHENSIVE METABOLIC PANEL
ALT: 19 IU/L (ref 0–32)
AST: 23 IU/L (ref 0–40)
Albumin/Globulin Ratio: 1.6 (ref 1.2–2.2)
Albumin: 4.2 g/dL (ref 3.9–4.9)
Alkaline Phosphatase: 211 IU/L — ABNORMAL HIGH (ref 44–121)
BUN/Creatinine Ratio: 10 — ABNORMAL LOW (ref 12–28)
BUN: 10 mg/dL (ref 8–27)
Bilirubin Total: 0.5 mg/dL (ref 0.0–1.2)
CO2: 27 mmol/L (ref 20–29)
Calcium: 9.7 mg/dL (ref 8.7–10.3)
Chloride: 103 mmol/L (ref 96–106)
Creatinine, Ser: 0.97 mg/dL (ref 0.57–1.00)
Globulin, Total: 2.7 g/dL (ref 1.5–4.5)
Glucose: 87 mg/dL (ref 70–99)
Potassium: 4.1 mmol/L (ref 3.5–5.2)
Sodium: 144 mmol/L (ref 134–144)
Total Protein: 6.9 g/dL (ref 6.0–8.5)
eGFR: 64 mL/min/{1.73_m2} (ref >59)

## 2022-08-04 LAB — TSH: TSH: 1.11 u[IU]/mL (ref 0.450–4.500)

## 2022-08-04 LAB — CBC
Hematocrit: 45 % (ref 34.0–46.6)
Hemoglobin: 14.6 g/dL (ref 11.1–15.9)
MCH: 28.5 pg (ref 26.6–33.0)
MCHC: 32.4 g/dL (ref 31.5–35.7)
MCV: 88 fL (ref 79–97)
Platelets: 242 10*3/uL (ref 150–450)
RBC: 5.13 x10E6/uL (ref 3.77–5.28)
RDW: 13.3 % (ref 11.7–15.4)
WBC: 6.5 10*3/uL (ref 3.4–10.8)

## 2022-08-04 LAB — LIPID PANEL
Chol/HDL Ratio: 2.4 ratio (ref 0.0–4.4)
Cholesterol, Total: 124 mg/dL (ref 100–199)
HDL: 51 mg/dL (ref >39)
LDL Chol Calc (NIH): 60 mg/dL (ref 0–99)
Triglycerides: 57 mg/dL (ref 0–149)
VLDL Cholesterol Cal: 13 mg/dL (ref 5–40)

## 2022-08-04 LAB — HEMOGLOBIN A1C
Est. average glucose Bld gHb Est-mCnc: 114 mg/dL
Hgb A1c MFr Bld: 5.6 % (ref 4.8–5.6)

## 2022-08-04 LAB — LIPOPROTEIN A (LPA): Lipoprotein (a): 219.6 nmol/L — ABNORMAL HIGH (ref <75.0)

## 2022-08-07 ENCOUNTER — Encounter: Payer: Self-pay | Admitting: Cardiovascular Disease

## 2022-08-08 ENCOUNTER — Telehealth: Payer: Self-pay | Admitting: Cardiovascular Disease

## 2022-08-08 MED ORDER — EZETIMIBE 10 MG PO TABS: 10.0000 mg | ORAL_TABLET | Freq: Every day | ORAL | 3 refills | Status: DC

## 2022-08-08 MED ORDER — ATORVASTATIN CALCIUM 80 MG PO TABS: 80.0000 mg | ORAL_TABLET | Freq: Every day | ORAL | 3 refills | Status: DC

## 2022-08-08 NOTE — Telephone Encounter (Signed)
Patient calling in to say that she uses the [facility]. Please advice

## 2022-08-08 NOTE — Telephone Encounter (Signed)
Spoke with pt, refills for atorvastatin and ezetimibe sent to the preferred pharmacy.

## 2022-10-24 ENCOUNTER — Other Ambulatory Visit: Payer: Self-pay | Admitting: Cardiovascular Disease

## 2023-01-04 ENCOUNTER — Other Ambulatory Visit: Payer: Self-pay | Admitting: Nurse Practitioner

## 2023-01-04 ENCOUNTER — Other Ambulatory Visit: Payer: Self-pay | Admitting: *Deleted

## 2023-01-04 DIAGNOSIS — Z1231 Encounter for screening mammogram for malignant neoplasm of breast: Secondary | ICD-10-CM

## 2023-02-14 ENCOUNTER — Ambulatory Visit: Payer: 59

## 2023-02-15 ENCOUNTER — Ambulatory Visit: Payer: 59 | Admitting: Cardiology

## 2023-02-15 ENCOUNTER — Encounter: Payer: Self-pay | Admitting: Cardiology

## 2023-02-15 ENCOUNTER — Other Ambulatory Visit: Payer: Self-pay | Admitting: Cardiology

## 2023-02-15 VITALS — BP 153/87 | HR 87 | Resp 16 | Ht 66.0 in | Wt 235.0 lb

## 2023-02-15 DIAGNOSIS — I25810 Atherosclerosis of coronary artery bypass graft(s) without angina pectoris: Secondary | ICD-10-CM

## 2023-02-15 DIAGNOSIS — I10 Essential (primary) hypertension: Secondary | ICD-10-CM

## 2023-02-15 DIAGNOSIS — E7841 Elevated Lipoprotein(a): Secondary | ICD-10-CM

## 2023-02-15 MED ORDER — METOPROLOL SUCCINATE ER 50 MG PO TB24
50.0000 mg | ORAL_TABLET | Freq: Every day | ORAL | 3 refills | Status: DC
Start: 2023-02-15 — End: 2023-12-04

## 2023-02-15 MED ORDER — ATORVASTATIN CALCIUM 40 MG PO TABS: 40.0000 mg | ORAL_TABLET | Freq: Every day | ORAL | 3 refills | Status: AC

## 2023-02-15 NOTE — Progress Notes (Signed)
Patient referred by Elie Confer, NP for coronary artery disease  Subjective:   Amber Wong, female    DOB: 1956-02-13, 67 y.o.   MRN: [id number]   Chief Complaint  Patient presents with   Post -Op Open Heart Surgery   Coronary artery disease involving coronary bypass graft of    New Patient (Initial Visit)    Transfer care from [facility]/ Referred by Ruthy Dick, NP    HPI  67 y.o. African American female with hypertension, hyperlipidemia, elevated lipoprotein (a), OSA, CAD s/p CABGX3 (LIMA-LAD, SVG-OM, SVG-diag)  Patient previously saw Dr. Tresa Endo, and would like to change physician now.  Patient lives alone, independent with ADLs, does not do any regular physical activity or exercise.  She denies any exertional chest pain, but has had wheezing with climbing up stairs ever since diagnosis of coronary artery disease in 2015.  She has never had a lung function testing.  She denies any orthopnea, PND, leg edema symptoms.  For last few days, she has had dizziness with certain movements.  She has prior history of vertigo.  Independently reviewed and interpreted prior medical records, and testing.  Past Medical History:  Diagnosis Date   Asthma    Depression    Hypercholesteremia    Hypertension    Morbid obesity (HCC)    Nocturia    Obstructive sleep apnea (adult) (pediatric) 12/27/2012   AHI 17.7 baseline, titrated to 6 cm , AHi now 2.4 - compliance    OSA (obstructive sleep apnea)    w/ hypoventilation   Sleep apnea      Past Surgical History:  Procedure Laterality Date   BREAST EXCISIONAL BIOPSY Right    CORONARY ARTERY BYPASS GRAFT N/A 10/20/2013   Procedure: CORONARY ARTERY BYPASS GRAFTING (CABG);  Surgeon: Kerin Perna, MD;  Location: [facility] OR;  Service: Open Heart Surgery;  Laterality: N/A;  CABG x 3   CYSTECTOMY     INTRAOPERATIVE TRANSESOPHAGEAL ECHOCARDIOGRAM N/A 10/20/2013   Procedure: INTRAOPERATIVE TRANSESOPHAGEAL ECHOCARDIOGRAM;  Surgeon:  Kerin Perna, MD;  Location: [facility] OR;  Service: Open Heart Surgery;  Laterality: N/A;   KNEE SURGERY     chronic pain and limping since   LEFT HEART CATHETERIZATION WITH CORONARY ANGIOGRAM N/A 10/16/2013   Procedure: LEFT HEART CATHETERIZATION WITH CORONARY ANGIOGRAM;  Surgeon: Lennette Bihari, MD;  Location: [facility] CATH LAB;  Service: Cardiovascular;  Laterality: N/A;   PARTIAL HYSTERECTOMY     TONSILLECTOMY       Social History   Tobacco Use  Smoking Status Former   Types: Cigarettes  Smokeless Tobacco Never  Tobacco Comments   Quit 2005    Social History   Substance and Sexual Activity  Alcohol Use No     Family History  Problem Relation Age of Onset   Heart attack Mother 42       died of MI   Heart attack Father 94       died of MI   Cancer Sister        double mastectomy   Heart attack Sister        2 sisters in their 48's with MI/stents   Breast cancer Sister 22   Breast cancer Paternal Aunt    Breast cancer Cousin       Current Outpatient Medications:    acyclovir (ZOVIRAX) 200 MG capsule, Take 200 mg by mouth every morning., Disp: , Rfl:    albuterol (VENTOLIN HFA) 108 (90 Base) MCG/ACT inhaler, Inhale  into the lungs every 6 (six) hours as needed for wheezing or shortness of breath. (Patient not taking: Reported on 08/03/2022), Disp: , Rfl:    allopurinol (ZYLOPRIM) 100 MG tablet, Take 100 mg by mouth 3 (three) times daily., Disp: , Rfl:    amLODipine (NORVASC) 2.5 MG tablet, Take 1 tablet (2.5 mg total) by mouth daily., Disp: 90 tablet, Rfl: 0   aspirin 81 MG tablet, Take 81 mg by mouth daily. , Disp: , Rfl:    atorvastatin (LIPITOR) 80 MG tablet, Take 1 tablet (80 mg total) by mouth daily., Disp: 90 tablet, Rfl: 3   buPROPion (WELLBUTRIN) 100 MG tablet, Take 1 tablet (100 mg total) by mouth 2 (two) times daily., Disp: 60 tablet, Rfl: 2   cholecalciferol (VITAMIN D) 1000 units tablet, Take 1,000 Units by mouth daily. (Patient not taking: Reported on 08/03/2022),  Disp: , Rfl:    cromolyn (OPTICROM) 4 % ophthalmic solution, Place 1 drop into both eyes 4 (four) times daily. (Patient not taking: Reported on 08/03/2022), Disp: , Rfl: 2   ezetimibe (ZETIA) 10 MG tablet, Take 1 tablet (10 mg total) by mouth daily. Please keep scheduled appointment with cardiologist, Disp: 90 tablet, Rfl: 3   fluconazole (DIFLUCAN) 150 MG tablet, Take 1 tablet by mouth daily as needed. When yeast infection is present (Patient not taking: Reported on 08/03/2022), Disp: , Rfl: 1   fluticasone (FLOVENT HFA) 110 MCG/ACT inhaler, Flovent HFA 110 mcg/actuation aerosol inhaler, Disp: , Rfl:    hydrOXYzine (ATARAX/VISTARIL) 10 MG tablet, TAKE 1 TABLET BY MOUTH THREE TIMES A DAY AS NEEDED (Patient not taking: Reported on 08/03/2022), Disp: 270 tablet, Rfl: 1   ketoconazole (NIZORAL) 2 % cream, Apply topically 2 (two) times daily. (Patient not taking: Reported on 08/03/2022), Disp: , Rfl:    lisinopril (ZESTRIL) 40 MG tablet, Take 1 tablet (40 mg total) by mouth daily., Disp: 90 tablet, Rfl: 3   meclizine (ANTIVERT) 25 MG tablet, meclizine 25 mg tablet  Take 1 tablet 3 times a day by oral route as needed for 10 days., Disp: , Rfl:    meloxicam (MOBIC) 15 MG tablet, Take 15 mg by mouth as needed.  (Patient not taking: Reported on 08/03/2022), Disp: , Rfl:    metoprolol tartrate (LOPRESSOR) 25 MG tablet, TAKE 2 TABLETS BY MOUTH IN THE MORNING AND 1 TABLET AT NIGHT, Disp: 270 tablet, Rfl: 3   metroNIDAZOLE (METROGEL) 0.75 % vaginal gel, metronidazole 0.75 % vaginal gel, Disp: , Rfl:    nitroGLYCERIN (NITROSTAT) 0.4 MG SL tablet, Place 1 tablet (0.4 mg total) under the tongue every 5 (five) minutes as needed for chest pain., Disp: 75 tablet, Rfl: 2   pantoprazole (PROTONIX) 40 MG tablet, Take 1 tablet (40 mg total) by mouth 2 (two) times daily., Disp: 60 tablet, Rfl: 6   PARoxetine (PAXIL) 20 MG tablet, Take 20 mg by mouth daily., Disp: , Rfl:    PARoxetine (PAXIL) 30 MG tablet, TAKE 1 TABLET BY MOUTH  EVERY MORNING (Patient not taking: Reported on 08/03/2022), Disp: 90 tablet, Rfl: 1   Cardiovascular and other pertinent studies:  Reviewed external labs and tests, independently interpreted  EKG 02/15/2023: Sinus rhythm 67 bpm  Nonspecific T-abnormality  Recent labs: 08/03/2022: Glucose 87, BUN/Cr 10/0.97. EGFR 64. Na/K 144/4.1. AlKP 211. Rest of the CMP normal H/H 14/45. MCV 88. Platelets 242 HbA1C 5.6% Chol 124, TG 57, HDL 51, LDL 60 Lipoprotein (a) 219 TSH 1.1 normal    Review of Systems  Cardiovascular:  Negative for chest pain, dyspnea on exertion, leg swelling, palpitations and syncope.  Respiratory:  Positive for wheezing (On exertion).          Vitals:   02/15/23 0925  BP: (!) 153/87  Pulse: 87  Resp: 16  SpO2: 96%     Body mass index is 37.93 kg/m. Filed Weights   02/15/23 0925  Weight: 235 lb (106.6 kg)     Objective:   Physical Exam Vitals and nursing note reviewed.  Constitutional:      General: She is not in acute distress. Neck:     Vascular: No JVD.  Cardiovascular:     Rate and Rhythm: Normal rate and regular rhythm.     Heart sounds: Normal heart sounds. No murmur heard. Pulmonary:     Effort: Pulmonary effort is normal.     Breath sounds: Normal breath sounds. No wheezing or rales.  Chest:     Comments: Healed sternotomy scar Musculoskeletal:     Right lower leg: No edema.     Left lower leg: No edema.          Visit diagnoses:   ICD-10-CM   1. Coronary artery disease involving coronary bypass graft of native heart without angina pectoris  I25.810 EKG 12-Lead    atorvastatin (LIPITOR) 40 MG tablet    Lipid panel    Lipid panel    PCV ECHOCARDIOGRAM COMPLETE    metoprolol succinate (TOPROL-XL) 50 MG 24 hr tablet    PCV MYOCARDIAL PERFUSION WITH LEXISCAN    CANCELED: Myocardial Perfusion Imaging    CANCELED: ECHOCARDIOGRAM COMPLETE    CANCELED: PCV MYOCARDIAL PERFUSION WO LEXISCAN    2. Essential hypertension  I10      3. Elevated lipoprotein(a)  E78.41        Orders Placed This Encounter  Procedures   Lipid panel   PCV MYOCARDIAL PERFUSION WITH LEXISCAN   EKG 12-Lead   PCV ECHOCARDIOGRAM COMPLETE     Medication changes this visit: Medications Discontinued During This Encounter  Medication Reason   cromolyn (OPTICROM) 4 % ophthalmic solution Completed Course   hydrOXYzine (ATARAX/VISTARIL) 10 MG tablet    atorvastatin (LIPITOR) 80 MG tablet Reorder   metoprolol tartrate (LOPRESSOR) 25 MG tablet Change in therapy    Meds ordered this encounter  Medications   atorvastatin (LIPITOR) 40 MG tablet    Sig: Take 1 tablet (40 mg total) by mouth daily.    Dispense:  90 tablet    Refill:  3   metoprolol succinate (TOPROL-XL) 50 MG 24 hr tablet    Sig: Take 1 tablet (50 mg total) by mouth daily. Take with or immediately following a meal.    Dispense:  90 tablet    Refill:  3     Assessment & Recommendations:   67 y.o. African American female with hypertension, hyperlipidemia, elevated lipoprotein (a), OSA, CAD   CAD: Premature CAD requiring CABG in early 50s, s/p CABGX3 (LIMA-LAD, SVG-OM, SVG-diag) Currently, no angina symptoms, has exertional wheezing. Given no recent cardiovascular testing since 2019, recommend echocardiogram and Lexiscan nuclear stress test. Continue aspirin 81 mg daily. Patient was previously on metoprolol tartrate 50 mg in a.m., and 25 mg in p.m.  Bypass surgery was 10 years ago.  It is probable that there could have been some progression of coronary artery disease in the last 10 years, especially given her elevated lipoprotein a.  Therefore, we will continue metoprolol, but change it as following for ease of administration.  Will change  to metoprolol succinate 50 mg daily. Continue amlodipine 2.5 mg daily, along with lisinopril. LDL very well-controlled on Lipitor 80 mg daily, Zetia 10 mg daily, but elevated lipoprotein a at 219. Reduce Lipitor down to 40 mg daily with goal  being on using lowest dose of statin required to keep LDL <70. Repeat lipid panel in 3 months.  If LDL >70 at that time, will consider adding Repatha. Also discussed the patient regarding participation in clinical trials for lipoprotein a.  Patient would like to think about it.  Will discuss again at next visit.  Dizziness: Normal orthostatic vitals. Unlikely that her symptoms are related to any cardiac etiology.  Possibly related to vertigo.  Wheezing: No heart failure on exam.  It is rather unusual to develop asthma in 50s. Consider pulmonary function testing.  Will defer to PCP.  Further recommendations after above testing   Thank you for referring the patient to [place]. Please feel free to contact with any questions.   Elder Negus, MD Pager: [phone] Office: [phone]

## 2023-03-21 ENCOUNTER — Telehealth (HOSPITAL_COMMUNITY): Payer: Self-pay | Admitting: *Deleted

## 2023-03-21 NOTE — Telephone Encounter (Signed)
Patient given detailed instructions per Myocardial Perfusion Study Information Sheet for the test on 03/26/2023 at 11:00. Patient notified to arrive 15 minutes early and that it is imperative to arrive on time for appointment to keep from having the test rescheduled.  If you need to cancel or reschedule your appointment, please call the office within 24 hours of your appointment. . Patient verbalized understanding.Amber Wong

## 2023-03-26 ENCOUNTER — Ambulatory Visit (HOSPITAL_COMMUNITY): Payer: 59 | Attending: Cardiovascular Disease

## 2023-03-26 ENCOUNTER — Ambulatory Visit (HOSPITAL_BASED_OUTPATIENT_CLINIC_OR_DEPARTMENT_OTHER): Payer: 59

## 2023-03-26 DIAGNOSIS — I25810 Atherosclerosis of coronary artery bypass graft(s) without angina pectoris: Secondary | ICD-10-CM | POA: Diagnosis present

## 2023-03-26 LAB — ECHOCARDIOGRAM COMPLETE
Area-P 1/2: 3.84 cm2
S' Lateral: 2.6 cm

## 2023-03-26 MED ORDER — TECHNETIUM TC 99M TETROFOSMIN IV KIT
32.0000 | PACK | Freq: Once | INTRAVENOUS | Status: AC | PRN
  Administered 2023-03-26: 32 via INTRAVENOUS

## 2023-03-26 MED ORDER — REGADENOSON 0.4 MG/5ML IV SOLN
0.4000 mg | Freq: Once | INTRAVENOUS | Status: AC
  Administered 2023-03-26: 0.4 mg via INTRAVENOUS

## 2023-03-27 ENCOUNTER — Ambulatory Visit (HOSPITAL_COMMUNITY): Payer: 59 | Attending: Internal Medicine

## 2023-03-27 LAB — MYOCARDIAL PERFUSION IMAGING
LV dias vol: 42 mL (ref 46–106)
LV sys vol: 14 mL
Nuc Stress EF: 67 %
Peak HR: 104 {beats}/min
Rest HR: 83 {beats}/min
Rest Nuclear Isotope Dose: 29.7 mCi
SDS: 6
SRS: 0
SSS: 6
ST Depression (mm): 0 mm
Stress Nuclear Isotope Dose: 32 mCi
TID: 0.95

## 2023-03-27 MED ORDER — TECHNETIUM TC 99M TETROFOSMIN IV KIT
29.7000 | PACK | Freq: Once | INTRAVENOUS | Status: AC | PRN
  Administered 2023-03-27: 29.7 via INTRAVENOUS

## 2023-04-01 NOTE — Progress Notes (Signed)
  Essentially normal echocardiogram with minor abnormality.

## 2023-04-02 ENCOUNTER — Telehealth: Payer: Self-pay

## 2023-04-02 NOTE — Telephone Encounter (Signed)
-----   Message from Yates Decamp sent at 04/01/2023  7:11 PM EDT -----  Essentially normal echocardiogram with minor abnormality.

## 2023-04-02 NOTE — Telephone Encounter (Signed)
Patient aware of echo results. Verbalized understanding.

## 2023-05-17 ENCOUNTER — Ambulatory Visit: Payer: 59 | Attending: Cardiology | Admitting: Cardiology

## 2023-05-17 ENCOUNTER — Encounter: Payer: Self-pay | Admitting: Cardiology

## 2023-05-17 VITALS — BP 126/70 | HR 94 | Resp 16 | Ht 66.0 in | Wt 229.6 lb

## 2023-05-17 DIAGNOSIS — E782 Mixed hyperlipidemia: Secondary | ICD-10-CM

## 2023-05-17 DIAGNOSIS — I25118 Atherosclerotic heart disease of native coronary artery with other forms of angina pectoris: Secondary | ICD-10-CM

## 2023-05-17 DIAGNOSIS — I10 Essential (primary) hypertension: Secondary | ICD-10-CM | POA: Diagnosis not present

## 2023-05-17 NOTE — Patient Instructions (Signed)
 Medication Instructions:   Your physician recommends that you continue on your current medications as directed. Please refer to the Current Medication list given to you today. *If you need a refill on your cardiac medications before your next appointment, please call your pharmacy*   Lab Work:  TODAY--LIPIDS  If you have labs (blood work) drawn today and your tests are completely normal, you will receive your results only by: MyChart Message (if you have MyChart) OR A paper copy in the mail If you have any lab test that is abnormal or we need to change your treatment, we will call you to review the results.    Follow-Up: At [facility], you and your health needs are our priority.  As part of our continuing mission to provide you with exceptional heart care, we have created designated Provider Care Teams.  These Care Teams include your primary Cardiologist (physician) and Advanced Practice Providers (APPs -  Physician Assistants and Nurse Practitioners) who all work together to provide you with the care you need, when you need it.  We recommend signing up for the patient portal called "MyChart".  Sign up information is provided on this After Visit Summary.  MyChart is used to connect with patients for Virtual Visits (Telemedicine).  Patients are able to view lab/test results, encounter notes, upcoming appointments, etc.  Non-urgent messages can be sent to your provider as well.   To learn more about what you can do with MyChart, go to [email or web address].    Your next appointment:   1 year(s)  Provider:   DR. Rosemary Holms

## 2023-05-17 NOTE — Progress Notes (Signed)
Cardiology Office Note:  .   Date:  05/17/2023  ID:  Amber Wong, DOB 19-Apr-1956, MRN [id number] PCP: Debroah Baller, FNP   [facility] Providers Cardiologist:  Truett Mainland, MD PCP: Debroah Baller, FNP  Chief Complaint  Patient presents with   Coronary artery disease involving coronary bypass graft of    Follow-up           History of Present Illness: .    Amber Wong is a 67 y.o. female with hypertension, hyperlipidemia, elevated lipoprotein (a), OSA, CAD s/p CABGX3 (LIMA-LAD, SVG-OM, SVG-diag)  Patient is doing well.  She denies any exertional chest pain.  She does have coughing, and sharp chest pain with deep breathing  Reviewed recent test results with the patient, details below.    Vitals:   05/17/23 0947  BP: 126/70  Pulse: 94  Resp: 16  SpO2: 95%     ROS:  Review of Systems  Cardiovascular:  Positive for chest pain. Negative for dyspnea on exertion, leg swelling, palpitations and syncope.  Respiratory:  Positive for cough.      Studies Reviewed: Marland Kitchen        Independently interpreted 07/2022: Chol 124, TG 57, HDL 51, LDL 60 HbA1C 5.6% Hb 15 Cr 0.9  Stress test 03/27/2023:   The study is normal. The study is low risk.   No ST deviation was noted.   LV perfusion is normal.   Left ventricular function is normal. Nuclear stress EF: 67%. The left ventricular ejection fraction is hyperdynamic (>65%). End diastolic cavity size is normal. End systolic cavity size is normal.   Prior study available for comparison from 10/03/2017. No changes compared to prior study. Normal perfusion, LVEF 63%   Normal perfusion. LVEF 67% with normal wall motion. This is a low risk study. No change compared to prior study in 2019.    Echocardiogram 03/26/2023: 1. Left ventricular ejection fraction, by estimation, is 65 to 70%. The  left ventricle has normal function. The left ventricle has no regional  wall motion  abnormalities. Left ventricular diastolic parameters were  normal.   2. Right ventricular systolic function is normal. The right ventricular  size is normal.   3. The mitral valve is normal in structure. Trivial mitral valve  regurgitation.   4. The aortic valve is tricuspid. Aortic valve regurgitation is not  visualized. Aortic valve sclerosis is present, with no evidence of aortic  valve stenosis.   5. The inferior vena cava is normal in size with greater than 50%  respiratory variability, suggesting right atrial pressure of 3 mmHg.    Physical Exam:   Physical Exam Vitals and nursing note reviewed.  Constitutional:      General: She is not in acute distress.    Appearance: She is obese.  Neck:     Vascular: No JVD.  Cardiovascular:     Rate and Rhythm: Normal rate and regular rhythm.     Heart sounds: Normal heart sounds. No murmur heard. Pulmonary:     Effort: Pulmonary effort is normal.     Breath sounds: Normal breath sounds. No wheezing or rales.  Musculoskeletal:     Right lower leg: No edema.     Left lower leg: No edema.      VISIT DIAGNOSES:   ICD-10-CM   1. Mixed hyperlipidemia  E78.2 Lipid Profile    2. Coronary artery disease of native artery of native heart with stable angina pectoris (HCC)  I25.118  3. Essential hypertension  I10        ASSESSMENT AND PLAN: .    Amber Wong is a 67 y.o. female with hypertension, hyperlipidemia, elevated lipoprotein (a), OSA, CAD    Chest pain: Noncardiac.  Suspect she may have reactive airway syndrome given normal cough and chest pain with deep breathing. Defer workup to PCP.  CAD: Premature CAD requiring CABG in early 50s, s/p CABGX3 (LIMA-LAD, SVG-OM, SVG-diag) Currently, no angina symptoms, has exertional wheezing. Reassuring echocardiogram and stress test (03/2023). Continue aspirin 81 mg daily, metoprolol succinate 50 mg daily. Continue amlodipine 2.5 mg daily, along with lisinopril. LDL  very well-controlled on Lipitor 80 mg daily, Zetia 10 mg daily, but elevated lipoprotein a at 219. Given elevated lipoprotein a, I reduce Lipitor from 80 mg daily to 40 mg daily at last visit, with the goal of using lowest dose of statin required to keep LDL <70. Check lipid panel today. If LDL >70 at that time, will consider adding Repatha.  Hypertension: Well-controlled     F/u in 1 year  Signed, Elder Negus, MD

## 2023-05-18 ENCOUNTER — Other Ambulatory Visit: Payer: Self-pay | Admitting: *Deleted

## 2023-05-18 DIAGNOSIS — E7841 Elevated Lipoprotein(a): Secondary | ICD-10-CM

## 2023-05-18 DIAGNOSIS — E782 Mixed hyperlipidemia: Secondary | ICD-10-CM

## 2023-05-18 LAB — LIPID PANEL
Chol/HDL Ratio: 2.8 {ratio} (ref 0.0–4.4)
Cholesterol, Total: 148 mg/dL (ref 100–199)
HDL: 53 mg/dL (ref >39)
LDL Chol Calc (NIH): 82 mg/dL (ref 0–99)
Triglycerides: 66 mg/dL (ref 0–149)
VLDL Cholesterol Cal: 13 mg/dL (ref 5–40)

## 2023-06-04 ENCOUNTER — Other Ambulatory Visit: Payer: Self-pay | Admitting: Family Medicine

## 2023-06-04 DIAGNOSIS — Z1231 Encounter for screening mammogram for malignant neoplasm of breast: Secondary | ICD-10-CM

## 2023-06-28 ENCOUNTER — Ambulatory Visit: Payer: 59

## 2023-07-10 ENCOUNTER — Encounter: Payer: Self-pay | Admitting: Student

## 2023-07-10 ENCOUNTER — Other Ambulatory Visit (HOSPITAL_COMMUNITY): Payer: Self-pay

## 2023-07-10 ENCOUNTER — Ambulatory Visit: Payer: 59 | Attending: Cardiology | Admitting: Student

## 2023-07-10 ENCOUNTER — Telehealth: Payer: Self-pay | Admitting: Pharmacy Technician

## 2023-07-10 ENCOUNTER — Telehealth: Payer: Self-pay | Admitting: Pharmacist

## 2023-07-10 DIAGNOSIS — E782 Mixed hyperlipidemia: Secondary | ICD-10-CM | POA: Diagnosis not present

## 2023-07-10 DIAGNOSIS — E785 Hyperlipidemia, unspecified: Secondary | ICD-10-CM

## 2023-07-10 NOTE — Patient Instructions (Signed)
Your Results:             Your most recent labs Goal  Total Cholesterol 148 < 200  Triglycerides 66 < 150  HDL (happy/good cholesterol) 53 > 40  LDL (lousy/bad cholesterol 82 < 70   Medication changes: We will start the process to get PCSK9i (Repatha or Praluent)  covered by your insurance.  Once the prior authorization is complete, we will call you to let you know and confirm pharmacy information.      Praluent is a cholesterol medication that improved your body's ability to get rid of "bad cholesterol" known as LDL. It can lower your LDL up to 60%. It is an injection that is given under the skin every 2 weeks. The most common side effects of Praluent include runny nose, symptoms of the common cold, rarely flu or flu-like symptoms, back/muscle pain in about 3-4% of the patients, and redness, pain, or bruising at the injection site.    Repatha is a cholesterol medication that improved your body's ability to get rid of "bad cholesterol" known as LDL. It can lower your LDL up to 60%! It is an injection that is given under the skin every 2 weeks. The medication often requires a prior authorization from your insurance company.  The most common side effects of Repatha include runny nose, symptoms of the common cold, rarely flu or flu-like symptoms, back/muscle pain in about 3-4% of the patients, and redness, pain, or bruising at the injection site.   Lab orders: We want to repeat labs after 2-3 months.  We will send you a lab order to remind you once we get closer to that time.

## 2023-07-10 NOTE — Telephone Encounter (Signed)
Pharmacy Patient Advocate Encounter   Received notification from Pt Calls Messages that prior authorization for Repatha SureClick 140MG /ML auto-injectors is required/requested.   Insurance verification completed.   The patient is insured through [facility] .   Per test claim: PA required; PA submitted to above mentioned insurance via CoverMyMeds Key/confirmation #/EOC [id number] Status is pending

## 2023-07-10 NOTE — Progress Notes (Unsigned)
Patient ID: Amber Wong                 DOB: 1956-04-21                    MRN: [id number]      HPI: Amber Wong is a 68 y.o. female patient referred to lipid clinic by Dr.Patwardhan. PMH is significant for unstable angina, hypertension, diastolic HF, CAD s/p CABGX3 ,OSA, COPD, HLD,elevated lipoprotein (a).    Patient presnted today for lipid clinic, reports she has been taking Lipitor for long time and Zetia was added to it as her LDLc was not at goal. Recently her Lipitor dose was lowered to 40 mg. She eats home cooked meals. Does not necessarily pay attention that what she eats. Does not like fried food. Does not have exercise schedule but she stays active around the house. Uses stairs ( inside home) few times per day.  Reviewed options for lowering LDL cholesterol, PCSK-9 inhibitors,  and inclisiran.  Discussed mechanisms of action, dosing, side effects and potential decreases in LDL cholesterol.  Also reviewed cost information and potential options for patient assistance.   Current Medications: atorvastatin 40 mg daily and Zetia 10 mg daily  Intolerances: none  Risk Factors: hypertension, CAD s/p CABGX3  HLD,elevated lipoprotein (a)219.6 mg/dl .  LDL goal: <70 Last lab : TC 148, TG 66, HDL 53, LDLc 82   Diet: eats what ever she like to eat - mainly home cooked meals    Exercise: none Willing to start 10-15 min walks daily   Family History:  Relation Problem Comments  Mother (Deceased at age 21) Heart attack (Age: 67) died of MI    Father (Deceased at age 63) Heart attack (Age: 75) died of MI    Sister Breast cancer (Age: 30)   Cancer double mastectomy  Heart attack 2 sisters in their 33's with MI/stents    Paternal Aunt Breast cancer     Cousin Breast cancer     Social History:  Alcohol: none  Smoking: never  Labs: Lipid Panel     Component Value Date/Time   CHOL 148 05/17/2023 1016   TRIG 66 05/17/2023 1016   HDL 53 05/17/2023 1016    CHOLHDL 2.8 05/17/2023 1016   CHOLHDL 2.8 07/07/2016 1501   VLDL 16 07/07/2016 1501   LDLCALC 82 05/17/2023 1016   LABVLDL 13 05/17/2023 1016    Past Medical History:  Diagnosis Date   Asthma    Depression    Hypercholesteremia    Hypertension    Morbid obesity (HCC)    Nocturia    Obstructive sleep apnea (adult) (pediatric) 12/27/2012   AHI 17.7 baseline, titrated to 6 cm , AHi now 2.4 - compliance    OSA (obstructive sleep apnea)    w/ hypoventilation   Sleep apnea     Current Outpatient Medications on File Prior to Visit  Medication Sig Dispense Refill   acyclovir (ZOVIRAX) 200 MG capsule Take 200 mg by mouth every morning.     albuterol (VENTOLIN HFA) 108 (90 Base) MCG/ACT inhaler Inhale into the lungs every 6 (six) hours as needed for wheezing or shortness of breath.     allopurinol (ZYLOPRIM) 100 MG tablet Take 100 mg by mouth 3 (three) times daily.     amLODipine (NORVASC) 2.5 MG tablet Take 1 tablet (2.5 mg total) by mouth daily. 90 tablet 0   aspirin 81 MG tablet Take 81 mg by mouth daily.  atorvastatin (LIPITOR) 40 MG tablet Take 1 tablet (40 mg total) by mouth daily. 90 tablet 3   busPIRone (BUSPAR) 15 MG tablet Take 15 mg by mouth 2 (two) times daily.     cholecalciferol (VITAMIN D) 1000 units tablet Take 1,000 Units by mouth daily.     ezetimibe (ZETIA) 10 MG tablet Take 1 tablet (10 mg total) by mouth daily. Please keep scheduled appointment with cardiologist 90 tablet 3   fluticasone (FLOVENT HFA) 110 MCG/ACT inhaler Flovent HFA 110 mcg/actuation aerosol inhaler     ketoconazole (NIZORAL) 2 % cream Apply topically 2 (two) times daily.     lisinopril (ZESTRIL) 40 MG tablet Take 1 tablet (40 mg total) by mouth daily. 90 tablet 3   meclizine (ANTIVERT) 25 MG tablet meclizine 25 mg tablet  Take 1 tablet 3 times a day by oral route as needed for 10 days.     meloxicam (MOBIC) 15 MG tablet Take 15 mg by mouth as needed.     metoprolol succinate (TOPROL-XL) 50 MG 24  hr tablet Take 1 tablet (50 mg total) by mouth daily. Take with or immediately following a meal. 90 tablet 3   metroNIDAZOLE (METROGEL) 0.75 % vaginal gel metronidazole 0.75 % vaginal gel     nitroGLYCERIN (NITROSTAT) 0.4 MG SL tablet Place 1 tablet (0.4 mg total) under the tongue every 5 (five) minutes as needed for chest pain. 75 tablet 2   pantoprazole (PROTONIX) 40 MG tablet Take 1 tablet (40 mg total) by mouth 2 (two) times daily. 60 tablet 6   PARoxetine (PAXIL) 20 MG tablet Take 20 mg by mouth daily.     No current facility-administered medications on file prior to visit.    Allergies  Allergen Reactions   Grass Pollen(K-O-R-T-Swt Vern) Itching   Iohexol Other (See Comments)     Code: HIVES, Desc: premedicated 2 hrs prior with 200mg  soulumedrol IV, no reaction s/p injection, Onset Date: [id number]    Penicillins Other (See Comments)    REACTION: pruritis   Shellfish Allergy Other (See Comments)    Cannot have because of gout    Assessment/Plan:  1. Hyperlipidemia -  Problem  Hyperlipidemia With Target Ldl Less Than 70   Current Medications: atorvastatin 40 mg daily and Zetia 10 mg daily  Intolerances: none  Risk Factors: hypertension, CAD s/p CABGX3  HLD,elevated lipoprotein (a)219.6 mg/dl .  LDL goal: <70 Last lab : TC 148, TG 66, HDL 53, LDLc 82     Hyperlipidemia with target LDL less than 70 Assessment:  LDL goal: <70  mg/dl last LDLc 82 mg/dl (87/5643) while on high intensity statin and Zetia  Tolerates Zetia and high intensity statins well without any side effects  Risk factors includes hypertension, CAD s/p CABGX3  HLD,elevated lipoprotein (a)219.6 mg/dl .  Does not follow any exercise regimen or watch diet  Discussed next potential options (PCSK-9 inhibitors, and inclisiran); cost, dosing efficacy, side effects  Given elevated Lp(a) PCSK9i or Leqvio would be an appropriate next step   Plan: Continue taking current medications (atorvastatin 40 mg daily and Zetia  10 mg daily ) Will apply for PA for PCSK9i; will inform patient upon approval  Patient to start doing at least 150 minutes of moderate-intensity physical activity each week, plus two days of muscle-strengthening activity Lipid lab due in 2-3 months after starting PCSK9i ( April 28,2025)    Thank you,  Carmela Hurt, Pharm.D [facility] A Division of [facility] [address], [place], [place] [address]  Phone: [phone]; Fax: [phone]

## 2023-07-10 NOTE — Assessment & Plan Note (Signed)
Assessment:  LDL goal: <70  mg/dl last LDLc 82 mg/dl (13/0865) while on high intensity statin and Zetia  Tolerates Zetia and high intensity statins well without any side effects  Risk factors includes hypertension, CAD s/p CABGX3  HLD,elevated lipoprotein (a)219.6 mg/dl .  Does not follow any exercise regimen or watch diet  Discussed next potential options (PCSK-9 inhibitors, and inclisiran); cost, dosing efficacy, side effects  Given elevated Lp(a) PCSK9i or Leqvio would be an appropriate next step   Plan: Continue taking current medications (atorvastatin 40 mg daily and Zetia 10 mg daily ) Will apply for PA for PCSK9i; will inform patient upon approval  Patient to start doing at least 150 minutes of moderate-intensity physical activity each week, plus two days of muscle-strengthening activity Lipid lab due in 2-3 months after starting PCSK9i ( April 28,2025)

## 2023-07-11 ENCOUNTER — Other Ambulatory Visit (HOSPITAL_COMMUNITY): Payer: Self-pay

## 2023-07-11 NOTE — Telephone Encounter (Signed)
Pharmacy Patient Advocate Encounter  Received notification from [facility] that Prior Authorization for  Repatha SureClick 140MG /ML auto-injectors  has been APPROVED from 07/10/23 to 01/07/24. Ran test claim, Copay is $0.00. This test claim was processed through [facility]- copay amounts may vary at other pharmacies due to pharmacy/plan contracts, or as the patient moves through the different stages of their insurance plan.   PA #/Case ID/Reference #: [id number]

## 2023-07-12 ENCOUNTER — Ambulatory Visit: Payer: 59

## 2023-07-12 MED ORDER — REPATHA SURECLICK 140 MG/ML ~~LOC~~ SOAJ: 140.0000 mg | SUBCUTANEOUS | 3 refills | Status: DC

## 2023-07-12 NOTE — Addendum Note (Signed)
Addended by: Tylene Fantasia on: 07/12/2023 08:18 PM   Modules accepted: Orders

## 2023-07-12 NOTE — Telephone Encounter (Signed)
Call to inform patient n/a LVM. Lp(a) and lipid lab due April 22.

## 2023-07-17 NOTE — Telephone Encounter (Signed)
PA approved see other encounter

## 2023-07-25 ENCOUNTER — Ambulatory Visit: Payer: 59

## 2023-09-12 ENCOUNTER — Other Ambulatory Visit: Payer: Self-pay | Admitting: Family Medicine

## 2023-09-12 DIAGNOSIS — Z122 Encounter for screening for malignant neoplasm of respiratory organs: Secondary | ICD-10-CM

## 2023-11-26 ENCOUNTER — Telehealth: Payer: Self-pay | Admitting: Cardiovascular Disease

## 2023-11-26 NOTE — Telephone Encounter (Signed)
 Pt c/o of Chest Pain: STAT if active (IN THIS MOMENT) CP, including tightness, pressure, jaw pain, shoulder/upper arm/back pain, SOB, nausea, and vomiting.  1. Are you having CP right now (tightness, pressure, or discomfort)? No  2. Are you experiencing any other symptoms (ex. SOB, nausea, vomiting, sweating)? A little bit of nausea, headaches, pain in shoulders and arms  3. How long have you been experiencing CP? About a month  4. Is your CP continuous or coming and going? continuous  5. Have you taken Nitroglycerin ? Yes, relives the pain but not the pain in her shoulders  6. If CP returns before callback, please consider calling 911. ?

## 2023-11-26 NOTE — Telephone Encounter (Signed)
 Noted. Previously reassuring workup. Will see as scheduled.   Thanks MJP

## 2023-11-26 NOTE — Telephone Encounter (Signed)
 Spoke with patient and she states she has been having chest pain but her nitroglycerin  relieves the pain. The pain in her arm shoulder and back is constant. She does have SOB that she feels is getting worse with exertion. Currently asymptomatic.  She states she PCP and they advised f/u with cardiologist.   Appointment scheduled. ED precautions discussed

## 2023-12-04 ENCOUNTER — Encounter: Payer: Self-pay | Admitting: Cardiology

## 2023-12-04 ENCOUNTER — Ambulatory Visit: Attending: Cardiology | Admitting: Cardiology

## 2023-12-04 VITALS — BP 106/60 | HR 96 | Ht 66.0 in | Wt 231.2 lb

## 2023-12-04 DIAGNOSIS — E782 Mixed hyperlipidemia: Secondary | ICD-10-CM

## 2023-12-04 DIAGNOSIS — I10 Essential (primary) hypertension: Secondary | ICD-10-CM

## 2023-12-04 DIAGNOSIS — I25118 Atherosclerotic heart disease of native coronary artery with other forms of angina pectoris: Secondary | ICD-10-CM

## 2023-12-04 DIAGNOSIS — E7841 Elevated Lipoprotein(a): Secondary | ICD-10-CM | POA: Diagnosis not present

## 2023-12-04 DIAGNOSIS — F17210 Nicotine dependence, cigarettes, uncomplicated: Secondary | ICD-10-CM | POA: Insufficient documentation

## 2023-12-04 MED ORDER — LISINOPRIL 40 MG PO TABS: 40.0000 mg | ORAL_TABLET | Freq: Every day | ORAL | 3 refills | Status: AC

## 2023-12-04 NOTE — Patient Instructions (Signed)
 Medication Instructions:  Your physician recommends that you continue on your current medications as directed. Please refer to the Current Medication list given to you today.  *If you need a refill on your cardiac medications before your next appointment, please call your pharmacy*  Lab Work: Lipid Panel Today If you have labs (blood work) drawn today and your tests are completely normal, you will receive your results only by: MyChart Message (if you have MyChart) OR A paper copy in the mail If you have any lab test that is abnormal or we need to change your treatment, we will call you to review the results.  Testing/Procedures: None ordered.   Follow-Up: At [facility], you and your health needs are our priority.  As part of our continuing mission to provide you with exceptional heart care, our providers are all part of one team.  This team includes your primary Cardiologist (physician) and Advanced Practice Providers or APPs (Physician Assistants and Nurse Practitioners) who all work together to provide you with the care you need, when you need it.  Your next appointment:   3 months with Dr Elmira

## 2023-12-04 NOTE — Progress Notes (Signed)
 Cardiology Office Note:  .   Date:  12/04/2023  ID:  Amber Wong, DOB [birth year], MRN [id number] PCP: Ezra Montie Aquas, FNP  [facility] Providers Cardiologist:  Newman Lawrence, MD PCP: Ezra Montie Aquas, FNP  Chief Complaint  Patient presents with   Chest Pain           History of Present Illness: .    Amber Wong is a 68 y.o. female with hypertension, hyperlipidemia, elevated lipoprotein (a), OSA, CAD s/p CABGX3 (LIMA-LAD, SVG-OM, SVG-diag)  I last saw the patient in 05/2023, at that time she had been doing fairly well.  However, she started smoking since then, and started having dyspnea on exertion on walking up steps.  She denies any chest pain on exertion such as walking up steps, but notices chest pain at rest that sometimes resolves with nitroglycerin .  Of note, stress testing 03/2023 with reassuring.    Vitals:   12/04/23 1440  BP: 106/60  Pulse: 96  SpO2: 97%      ROS:  Review of Systems  Cardiovascular:  Positive for dyspnea on exertion. Negative for chest pain, leg swelling, palpitations and syncope.  Respiratory:  Negative for cough.      Studies Reviewed: SABRA        EKG 12/04/2023: Normal sinus rhythm Normal ECG When compared with ECG of 22-Oct-2018 01:40, No significant change was found    Labs 07/2022: Chol 124, TG 57, HDL 51, LDL 60 HbA1C 5.6% Hb 15 Cr 0.9  Stress test 03/27/2023:   The study is normal. The study is low risk.   No ST deviation was noted.   LV perfusion is normal.   Left ventricular function is normal. Nuclear stress EF: 67%. The left ventricular ejection fraction is hyperdynamic (>65%). End diastolic cavity size is normal. End systolic cavity size is normal.   Prior study available for comparison from 10/03/2017. No changes compared to prior study. Normal perfusion, LVEF 63%   Normal perfusion. LVEF 67% with normal wall motion. This is a low risk study. No change compared to  prior study in 2019.    Echocardiogram 03/26/2023: 1. Left ventricular ejection fraction, by estimation, is 65 to 70%. The  left ventricle has normal function. The left ventricle has no regional  wall motion abnormalities. Left ventricular diastolic parameters were  normal.   2. Right ventricular systolic function is normal. The right ventricular  size is normal.   3. The mitral valve is normal in structure. Trivial mitral valve  regurgitation.   4. The aortic valve is tricuspid. Aortic valve regurgitation is not  visualized. Aortic valve sclerosis is present, with no evidence of aortic  valve stenosis.   5. The inferior vena cava is normal in size with greater than 50%  respiratory variability, suggesting right atrial pressure of 3 mmHg.    Physical Exam:   Physical Exam Vitals and nursing note reviewed.  Constitutional:      General: She is not in acute distress.    Appearance: She is obese.  Neck:     Vascular: No JVD.   Cardiovascular:     Rate and Rhythm: Normal rate and regular rhythm.     Heart sounds: Normal heart sounds. No murmur heard. Pulmonary:     Effort: Pulmonary effort is normal.     Breath sounds: Normal breath sounds. No wheezing or rales.   Musculoskeletal:     Right lower leg: No edema.     Left lower leg: No  edema.     VISIT DIAGNOSES:   ICD-10-CM   1. Coronary artery disease of native artery of native heart with stable angina pectoris (HCC)  I25.118 EKG 12-Lead    Lipid panel    Lipid panel    2. Essential hypertension  I10     3. Elevated lipoprotein(a)  E78.41     4. Mixed hyperlipidemia  E78.2     5. Cigarette nicotine dependence without complication  F17.210         ASSESSMENT AND PLAN: .    Amber Wong is a 68 y.o. female with hypertension, hyperlipidemia, elevated lipoprotein (a), OSA, CAD    Chest pain: Chest pain at rest improving nitroglycerin , but does not seem to occur with exertion such as coming up  steps.  She has quit smoking a week ago, and has had some improvement in chest pain.  However, continues to have shortness of breath on exertion.  She has not seen pulmonologist before, has not had any lung function testing in the recent past.  Recommend pulmonary workup.  Given recent normal stress test, to obstructive CAD is less likely.  However, if she has more exertional chest pain in the near future, I would then consider coronary and bypass graft angiography.  CAD: Premature CAD requiring CABG in early 50s, s/p CABGX3 (LIMA-LAD, SVG-OM, SVG-diag) Noted elevated lipoprotein a. See above regarding stress testing.  Continue aspirin  81 mg daily, metoprolol  succinate 50 mg daily. Continue amlodipine  2.5 mg daily, along with lisinopril . Continue Lipitor  40 mg daily, Repatha .  Check lipid panel.  Hypertension: Well-controlled  Nicotine dependence: Patient has quit smoking a week ago.  Strongly recommend against smoking again.   F/u in 3 months   Signed, Newman [name], MD

## 2023-12-05 ENCOUNTER — Ambulatory Visit: Payer: Self-pay | Admitting: Cardiology

## 2023-12-05 DIAGNOSIS — E782 Mixed hyperlipidemia: Secondary | ICD-10-CM

## 2023-12-05 LAB — LIPID PANEL
Chol/HDL Ratio: 3 ratio (ref 0.0–4.4)
Cholesterol, Total: 139 mg/dL (ref 100–199)
HDL: 47 mg/dL (ref >39)
LDL Chol Calc (NIH): 77 mg/dL (ref 0–99)
Triglycerides: 79 mg/dL (ref 0–149)
VLDL Cholesterol Cal: 15 mg/dL (ref 5–40)

## 2023-12-05 NOTE — Progress Notes (Signed)
 LDL is at 77, higher than what our goal should be around 55.  I you taking Repatha  and Lipitor  40 mg regularly?  If yes, recommend increasing Lipitor  to 80 mg daily or adding Zetia  10 mg daily.  If you are not taking the above 2 medications regularly, strongly recommend regular compliance.  Thanks MJP

## 2023-12-06 ENCOUNTER — Other Ambulatory Visit: Payer: Self-pay | Admitting: Family

## 2023-12-06 ENCOUNTER — Ambulatory Visit
Admission: RE | Admit: 2023-12-06 | Discharge: 2023-12-06 | Disposition: A | Discharge status: Home / Self Care | Source: Ambulatory Visit | Attending: Family | Admitting: Family

## 2023-12-06 ENCOUNTER — Encounter: Payer: Self-pay | Admitting: Family

## 2023-12-06 DIAGNOSIS — M79605 Pain in left leg: Secondary | ICD-10-CM

## 2023-12-06 DIAGNOSIS — M25512 Pain in left shoulder: Secondary | ICD-10-CM

## 2023-12-10 ENCOUNTER — Telehealth: Payer: Self-pay | Admitting: Cardiology

## 2023-12-10 NOTE — Progress Notes (Signed)
 Attempted to contact patient, but unable to leave a message. Will try again later.

## 2023-12-10 NOTE — Telephone Encounter (Signed)
Pt calling in regards to Echo results. Please advise

## 2023-12-11 NOTE — Telephone Encounter (Signed)
 Please review lab result note.

## 2023-12-11 NOTE — Progress Notes (Signed)
Thanks MJP  

## 2024-01-09 ENCOUNTER — Other Ambulatory Visit: Payer: Self-pay | Admitting: Cardiology

## 2024-01-10 ENCOUNTER — Ambulatory Visit: Admitting: Cardiology

## 2024-01-11 NOTE — Telephone Encounter (Signed)
 Please have pcp fill non-cardiac meds

## 2024-01-11 NOTE — Telephone Encounter (Signed)
 Pt of Dr. Elmira. Would Dr. Elmira want to refill these? Please advise.

## 2024-01-11 NOTE — Telephone Encounter (Signed)
 Agree.  Thanks MJP

## 2024-01-21 ENCOUNTER — Telehealth: Payer: Self-pay | Admitting: Cardiovascular Disease

## 2024-01-21 NOTE — Telephone Encounter (Signed)
 Pt c/o medication issue:  1. Name of Medication:   Evolocumab  (REPATHA  SURECLICK) 140 MG/ML SOAJ    2. How are you currently taking this medication (dosage and times per day)?    3. Are you having a reaction (difficulty breathing--STAT)? no  4. What is your medication issue? Patient states that he legs and feet swell up pretty bad. Please advise .

## 2024-01-21 NOTE — Telephone Encounter (Signed)
Attempted to call pt, unable to reach. LMTCB.

## 2024-01-22 ENCOUNTER — Telehealth: Payer: Self-pay | Admitting: Pharmacy Technician

## 2024-01-22 ENCOUNTER — Other Ambulatory Visit (HOSPITAL_COMMUNITY): Payer: Self-pay

## 2024-01-22 NOTE — Telephone Encounter (Signed)
 SABRA

## 2024-01-22 NOTE — Telephone Encounter (Signed)
 Left voice message to call back 8/12

## 2024-01-22 NOTE — Telephone Encounter (Signed)
 Contacted patient and she reports that she started the use of the Repatha  towards the beginning of July. Pt admits that she was scared to start the medication. Since starting Repatha  she has been noticing swelling in legs and feet. Pt admits that she has had one episode of vomiting as well. Pt states that she did receive a cortisone injection in bilateral knees, but one of doctors told her that it should not be coming from the cortison injections. Pt also admits to some shortness of breath on exertion. Pt denies being on diuretics or noticing increase in weight. Appt made with DOD, Dr. Michele, on 8/15. Pt needing 2 to 3 day notice due to transportation. ED precautions reviewed with patient.

## 2024-01-22 NOTE — Telephone Encounter (Signed)
 Left message to call back.

## 2024-01-22 NOTE — Telephone Encounter (Signed)
 Patient reports swelling and itching to her legs and feet. She notes this began suddenly after taking her first injection of Repatha  last month. She reports last dose was on 12/27/23.  Swelling improves with elevation, but sides of feet are still swollen.  Patient denies any SOB or chest pain. She states she understands this medication is supposed to help with her cholesterol but she is too scared to take anymore.  Will forward to Dr. Elmira and Pharm D to review and advise.

## 2024-01-22 NOTE — Telephone Encounter (Signed)
 Patient was returning call. Please advise ?

## 2024-01-22 NOTE — Telephone Encounter (Signed)
 Spoke with PharmD Lynden), advised to have pt hold repatha  dose until evaluated.

## 2024-01-22 NOTE — Telephone Encounter (Signed)
 Patient returned RN's call.

## 2024-01-22 NOTE — Telephone Encounter (Signed)
 Please disregard, appt rescheduled to 8/19 with Dr. Elmira due to transportation issues.

## 2024-01-22 NOTE — Telephone Encounter (Signed)
 Noted.  Thanks MJP

## 2024-01-25 ENCOUNTER — Ambulatory Visit: Admitting: Cardiology

## 2024-01-29 ENCOUNTER — Other Ambulatory Visit (HOSPITAL_COMMUNITY): Payer: Self-pay

## 2024-01-29 ENCOUNTER — Encounter: Payer: Self-pay | Admitting: *Deleted

## 2024-01-29 ENCOUNTER — Encounter: Payer: Self-pay | Admitting: Cardiology

## 2024-01-29 ENCOUNTER — Ambulatory Visit: Attending: Cardiology | Admitting: Cardiology

## 2024-01-29 VITALS — BP 104/74 | HR 90 | Ht 66.0 in | Wt 226.0 lb

## 2024-01-29 DIAGNOSIS — R0609 Other forms of dyspnea: Secondary | ICD-10-CM | POA: Diagnosis not present

## 2024-01-29 DIAGNOSIS — I25118 Atherosclerotic heart disease of native coronary artery with other forms of angina pectoris: Secondary | ICD-10-CM

## 2024-01-29 DIAGNOSIS — E782 Mixed hyperlipidemia: Secondary | ICD-10-CM

## 2024-01-29 DIAGNOSIS — R0789 Other chest pain: Secondary | ICD-10-CM

## 2024-01-29 DIAGNOSIS — E7841 Elevated Lipoprotein(a): Secondary | ICD-10-CM

## 2024-01-29 DIAGNOSIS — R600 Localized edema: Secondary | ICD-10-CM

## 2024-01-29 MED ORDER — NITROGLYCERIN 0.4 MG SL SUBL
0.4000 mg | SUBLINGUAL_TABLET | SUBLINGUAL | 2 refills | Status: AC | PRN
  Filled 2024-01-29: qty 75, 15d supply, fill #0

## 2024-01-29 MED ORDER — FUROSEMIDE 20 MG PO TABS
20.0000 mg | ORAL_TABLET | Freq: Every day | ORAL | 3 refills | Status: DC
  Filled 2024-01-29: qty 90, 90d supply, fill #0

## 2024-01-29 NOTE — Patient Instructions (Addendum)
 Medication Instructions:  START LASIX  20 MG DAILY   START AS NEEDED FOR CHEST PAIN: Nitroglycerin   *If you need a refill on your cardiac medications before your next appointment, please call your pharmacy*  Lab Work: CBC BMP  If you have labs (blood work) drawn today and your tests are completely normal, you will receive your results only by: MyChart Message (if you have MyChart) OR A paper copy in the mail If you have any lab test that is abnormal or we need to change your treatment, we will call you to review the results.  Testing/Procedures: ECHO  Your physician has requested that you have an echocardiogram. Echocardiography is a painless test that uses sound waves to create images of your heart. It provides your doctor with information about the size and shape of your heart and how well your heart's chambers and valves are working. This procedure takes approximately one hour. There are no restrictions for this procedure. Please do NOT wear cologne, perfume, aftershave, or lotions (deodorant is allowed). Please arrive 15 minutes prior to your appointment time.  Please note: We ask at that you not bring children with you during ultrasound (echo/ vascular) testing. Due to room size and safety concerns, children are not allowed in the ultrasound rooms during exams. Our front office staff cannot provide observation of children in our lobby area while testing is being conducted. An adult accompanying a patient to their appointment will only be allowed in the ultrasound room at the discretion of the ultrasound technician under special circumstances. We apologize for any inconvenience.   LEFT HEART CATH   Your physician has requested that you have a cardiac catheterization. Cardiac catheterization is used to diagnose and/or treat various heart conditions. Doctors may recommend this procedure for a number of different reasons. The most common reason is to evaluate chest pain. Chest pain can be  a symptom of coronary artery disease (CAD), and cardiac catheterization can show whether plaque is narrowing or blocking your heart's arteries. This procedure is also used to evaluate the valves, as well as measure the blood flow and oxygen levels in different parts of your heart. For further information please visit [email or web address] Please follow instruction sheet, as given.   Follow-Up: At [facility], you and your health needs are our priority.  As part of our continuing mission to provide you with exceptional heart care, our providers are all part of one team.  This team includes your primary Cardiologist (physician) and Advanced Practice Providers or APPs (Physician Assistants and Nurse Practitioners) who all work together to provide you with the care you need, when you need it.  Your next appointment:   2 week(s)  Provider:   One of our Advanced Practice Providers (APPs): Morse Clause, PA-C  Lamarr Satterfield, NP Miriam Shams, NP  Olivia Pavy, PA-C Josefa Beauvais, NP  Leontine Salen, PA-C Orren Fabry, PA-C  [place], PA-C Ernest Dick, NP  Damien Braver, NP Jon Hails, PA-C  Waddell Donath, PA-C    Dayna Dunn, PA-C  Glendia Ferrier, PA-C Lum Louis, NP Katlyn West, NP Callie Goodrich, PA-C  Evan Williams, PA-C Sheng Haley, PA-C  Xika Zhao, NP Kathleen Johnson, PA-C    Other Instructions I have reviewed the risks, indications, and alternatives to cardiac catheterization, possible angioplasty, and stenting with the patient. Risks include but are not limited to bleeding, infection, vascular injury, stroke, myocardial infection, arrhythmia, kidney injury, radiation-related injury in the case of prolonged fluoroscopy use, emergency cardiac surgery, and death.  The patient understands the risks of serious complication is 1-2 in 1000 with diagnostic cardiac cath and 1-2% or less with angioplasty/stenting.

## 2024-01-29 NOTE — Progress Notes (Unsigned)
 Cardiology Office Note:  .   Date:  01/29/2024  ID:  Amber Wong, DOB [birth year], MRN [id number] PCP: Alec House, MD  [facility] Providers Cardiologist:  Newman Lawrence, MD PCP: Alec House, MD  Chief Complaint  Patient presents with   Coronary Artery Disease      History of Present Illness: .    Amber Wong is a 68 y.o. female with hypertension, hyperlipidemia, elevated lipoprotein (a), OSA, CAD s/p CABGX3 (LIMA-LAD, SVG-OM, SVG-diag)  Stress test result is significant ischemia, patient continues to have exertional chest pain with climbing up flight of stairs, associated with shortness of breath.  Recently, she also noticed leg edema, that she attributed to Repatha .  Therefore, she stopped taking Repatha  injections.    Vitals:   01/29/24 1516  BP: 104/74  Pulse: 90  SpO2: 97%      ROS:  Review of Systems  Cardiovascular:  Positive for chest pain and dyspnea on exertion. Negative for leg swelling, palpitations and syncope.  Respiratory:  Negative for cough.      Studies Reviewed: SABRA        EKG 12/04/2023: Normal sinus rhythm Normal ECG When compared with ECG of 22-Oct-2018 01:40, No significant change was found    Labs 07/2022: Chol 124, TG 57, HDL 51, LDL 60 HbA1C 5.6% Hb 15 Cr 0.9  Stress test 03/27/2023:   The study is normal. The study is low risk.   No ST deviation was noted.   LV perfusion is normal.   Left ventricular function is normal. Nuclear stress EF: 67%. The left ventricular ejection fraction is hyperdynamic (>65%). End diastolic cavity size is normal. End systolic cavity size is normal.   Prior study available for comparison from 10/03/2017. No changes compared to prior study. Normal perfusion, LVEF 63%   Normal perfusion. LVEF 67% with normal wall motion. This is a low risk study. No change compared to prior study in 2019.    Echocardiogram 03/26/2023: 1. Left ventricular ejection fraction, by  estimation, is 65 to 70%. The  left ventricle has normal function. The left ventricle has no regional  wall motion abnormalities. Left ventricular diastolic parameters were  normal.   2. Right ventricular systolic function is normal. The right ventricular  size is normal.   3. The mitral valve is normal in structure. Trivial mitral valve  regurgitation.   4. The aortic valve is tricuspid. Aortic valve regurgitation is not  visualized. Aortic valve sclerosis is present, with no evidence of aortic  valve stenosis.   5. The inferior vena cava is normal in size with greater than 50%  respiratory variability, suggesting right atrial pressure of 3 mmHg.    Physical Exam:   Physical Exam Vitals and nursing note reviewed.  Constitutional:      General: She is not in acute distress.    Appearance: She is obese.  Neck:     Vascular: No JVD.  Cardiovascular:     Rate and Rhythm: Normal rate and regular rhythm.     Heart sounds: Normal heart sounds. No murmur heard. Pulmonary:     Effort: Pulmonary effort is normal.     Breath sounds: Normal breath sounds. No wheezing or rales.  Musculoskeletal:     Right lower leg: No edema.     Left lower leg: No edema.      VISIT DIAGNOSES:   ICD-10-CM   1. Coronary artery disease of native artery of native heart with stable angina pectoris (HCC)  I25.118 Basic metabolic panel with GFR    CBC    nitroGLYCERIN  (NITROSTAT ) 0.4 MG SL tablet    2. Leg edema  R60.0 ECHOCARDIOGRAM COMPLETE    Pro b natriuretic peptide (BNP)    3. Exertional dyspnea  R06.09     4. Mixed hyperlipidemia  E78.2     5. Elevated lipoprotein(a)  E78.41          ASSESSMENT AND PLAN: .    Amber Wong is a 68 y.o. female with hypertension, hyperlipidemia, elevated lipoprotein (a), OSA, CAD    Chest pain: Recent stress test with no ischemia.  However, patient continues to have exertional chest pain symptoms concerning for angina.  Medical management  of blood pressure limited due to low normal blood pressure. Recommend coronary and bypass Angiography with possible intervention. Continue aspirin , metoprolol , amlodipine , lisinopril .  Exertional dyspnea, leg edema: Concerning for heart failure.  Check echocardiogram, proBNP. I do not think leg edema was related to Repatha .  Resume Repatha .  I have reviewed the risks, indications, and alternatives to cardiac catheterization, possible angioplasty, and stenting with the patient. Risks include but are not limited to bleeding, infection, vascular injury, stroke, myocardial infection, arrhythmia, kidney injury, radiation-related injury in the case of prolonged fluoroscopy use, emergency cardiac surgery, and death. The patient understands the risks of serious complication is 1-2 in 1000 with diagnostic cardiac cath and 1-2% or less with angioplasty/stenting.   Hypertension: Well-controlled   Informed Consent   Shared Decision Making/Informed Consent The risks [stroke (1 in 1000), death (1 in 1000), kidney failure [usually temporary] (1 in 500), bleeding (1 in 200), allergic reaction [possibly serious] (1 in 200)], benefits (diagnostic support and management of coronary artery disease) and alternatives of a cardiac catheterization were discussed in detail with Amber Wong and she is willing to proceed.      F/u after cath  Signed, Newman [name], MD

## 2024-01-30 ENCOUNTER — Telehealth: Payer: Self-pay | Admitting: Cardiology

## 2024-01-30 ENCOUNTER — Encounter: Payer: Self-pay | Admitting: Cardiology

## 2024-01-30 DIAGNOSIS — R600 Localized edema: Secondary | ICD-10-CM | POA: Insufficient documentation

## 2024-01-30 LAB — CBC
Hematocrit: 37.5 % (ref 34.0–46.6)
Hemoglobin: 12.4 g/dL (ref 11.1–15.9)
MCH: 29.5 pg (ref 26.6–33.0)
MCHC: 33.1 g/dL (ref 31.5–35.7)
MCV: 89 fL (ref 79–97)
Platelets: 266 x10E3/uL (ref 150–450)
RBC: 4.2 x10E6/uL (ref 3.77–5.28)
RDW: 15.3 % (ref 11.7–15.4)
WBC: 6.1 x10E3/uL (ref 3.4–10.8)

## 2024-01-30 LAB — BASIC METABOLIC PANEL WITH GFR
BUN/Creatinine Ratio: 11 — AB (ref 12–28)
BUN: 9 mg/dL (ref 8–27)
CO2: 24 mmol/L (ref 20–29)
Calcium: 9.4 mg/dL (ref 8.7–10.3)
Chloride: 104 mmol/L (ref 96–106)
Creatinine, Ser: 0.85 mg/dL (ref 0.57–1.00)
Glucose: 71 mg/dL (ref 70–99)
Potassium: 3.3 mmol/L — AB (ref 3.5–5.2)
Sodium: 144 mmol/L (ref 134–144)
eGFR: 75 mL/min/1.73 (ref >59)

## 2024-01-30 LAB — PRO B NATRIURETIC PEPTIDE: NT-Pro BNP: 125 pg/mL (ref 0–301)

## 2024-01-30 NOTE — Telephone Encounter (Signed)
 Spoke with pt. Pt stated she had already planned to call and cancel surgery until after her cath. She just wanted Dr Elmira to know. She stated she spoke with someone earlier to set up all appointments. Will forward to Dr Elmira and his nurse as Amber Wong.

## 2024-01-30 NOTE — Telephone Encounter (Signed)
 Patient wants to know if she should wait to have cataract surgery until after her cath on 09/04. Please advise.

## 2024-01-31 ENCOUNTER — Ambulatory Visit: Payer: Self-pay | Admitting: Cardiology

## 2024-01-31 ENCOUNTER — Encounter: Payer: Self-pay | Admitting: *Deleted

## 2024-01-31 NOTE — Progress Notes (Signed)
 Potassium 3.3.  Recommend adding K-Dur 20 mEq daily.  Thanks MJP

## 2024-01-31 NOTE — Telephone Encounter (Signed)
Pt contacted and advised.

## 2024-01-31 NOTE — Telephone Encounter (Signed)
 Patient returned RN's call.

## 2024-01-31 NOTE — Telephone Encounter (Signed)
 Left message to call back.

## 2024-01-31 NOTE — Telephone Encounter (Signed)
 No increased cardiac risk with cataract surgery.  She may proceed with cataract surgery before or after heart catheterization.  Thanks MJP

## 2024-02-04 ENCOUNTER — Encounter: Payer: Self-pay | Admitting: *Deleted

## 2024-02-04 ENCOUNTER — Telehealth: Payer: Self-pay | Admitting: Cardiology

## 2024-02-04 NOTE — Telephone Encounter (Signed)
  Per MyChart scheduling message:  This is Lateefa D.Ousmane Alhassane. I am requesting that you send a Dr's note for Amber Wong to [facility]. to be excused from work on the 4th and 5th of Sept. 2025 because He is the Adult that will be assisting there for the procedure , and watching me for 24 hrs afterwards. Sincerely Ms.Antonieta D.Ousmane [phone]

## 2024-02-04 NOTE — Telephone Encounter (Signed)
 Unable to reach pt or leave a message  Letter sent to patient via my chart.

## 2024-02-04 NOTE — Telephone Encounter (Signed)
 Okay with me.  Thanks MJP

## 2024-02-07 ENCOUNTER — Other Ambulatory Visit (HOSPITAL_COMMUNITY): Payer: Self-pay

## 2024-02-07 ENCOUNTER — Other Ambulatory Visit: Payer: Self-pay | Admitting: *Deleted

## 2024-02-07 ENCOUNTER — Telehealth: Payer: Self-pay | Admitting: *Deleted

## 2024-02-07 MED ORDER — PREDNISONE 50 MG PO TABS
ORAL_TABLET | ORAL | 0 refills | Status: DC
Start: 2024-02-07 — End: 2024-02-28
  Filled 2024-02-07: qty 3, 2d supply, fill #0

## 2024-02-07 NOTE — Telephone Encounter (Signed)
 Reviewed instructions for 13 hour Prednisone  and Benadryl  Prep with patient.  Cardiac Catheterization scheduled at [facility] for: Thursday February 14, 2024 9 AM Arrival time [facility] Main Entrance A at: 7 AM  Diet: -Nothing to eat after midnight.  Hydration: -May drink clear liquids until 2 hours before the procedure.  Approved liquids: Water, clear tea, black coffee, fruit juices-non-citric and without pulp,Gatorade, plain Jello/popsicles.   -Please drink 16 oz bottle of water 2 hours before procedure.  CONTRAST ALLERGY: 13 hour Prednisone  and Benadryl  Prep: 02/13/24 Prednisone  50 mg at 8 PM 02/14/24 Prednisone  50 mg at 2 AM 02/14/24 Prednisone  50 mg and Benadryl  50 mg just prior to leaving home for the hospital. Pt advised not to drive after taking Benadryl .  Medication instructions: -Hold:  Lasix -AM of procedure  -Other usual morning medications can be taken including aspirin  81 mg.  Plan to go home the same day, you will only stay overnight if medically necessary.  You must have responsible adult to drive you home.  Someone must be with you the first 24 hours after you arrive home.

## 2024-02-12 ENCOUNTER — Other Ambulatory Visit (HOSPITAL_COMMUNITY): Payer: Self-pay

## 2024-02-12 ENCOUNTER — Ambulatory Visit (HOSPITAL_COMMUNITY)
Admission: RE | Admit: 2024-02-12 | Discharge: 2024-02-12 | Disposition: A | Discharge status: Home / Self Care | Source: Ambulatory Visit | Attending: Cardiology | Admitting: Cardiology

## 2024-02-12 DIAGNOSIS — I2510 Atherosclerotic heart disease of native coronary artery without angina pectoris: Secondary | ICD-10-CM

## 2024-02-12 DIAGNOSIS — Z0279 Encounter for issue of other medical certificate: Secondary | ICD-10-CM

## 2024-02-12 DIAGNOSIS — R600 Localized edema: Secondary | ICD-10-CM | POA: Insufficient documentation

## 2024-02-12 LAB — ECHOCARDIOGRAM COMPLETE
Area-P 1/2: 3.62 cm2
S' Lateral: 2.4 cm

## 2024-02-12 NOTE — Telephone Encounter (Signed)
 Try to call pt, N/A mail box is full

## 2024-02-13 ENCOUNTER — Telehealth: Payer: Self-pay | Admitting: Cardiology

## 2024-02-13 NOTE — Telephone Encounter (Signed)
 I wont be in the office until 9/5. Happy to fill it then.   Thanks MJP

## 2024-02-13 NOTE — Telephone Encounter (Signed)
 Spoke to patient, will come in to learn proper administration steps of Repatha  in next couple weeks. Going for [facility] on Sept 04,2025. Pt will call us  back post procedure at [phone]

## 2024-02-13 NOTE — Telephone Encounter (Signed)
 Reviewed procedure instructions, 13 hour Prednisone  and Benadryl  Prep with patient.

## 2024-02-13 NOTE — Telephone Encounter (Signed)
 Received [employer] FMLA form for patient's son, Jermaine James. Patient signed release of information and paid the $29 fee.  Form is in Dr. Gilda box.

## 2024-02-14 ENCOUNTER — Encounter (HOSPITAL_COMMUNITY)
Admission: RE | Disposition: A | Discharge status: Home / Self Care | Payer: Self-pay | Source: Home / Self Care | Attending: Cardiology

## 2024-02-14 ENCOUNTER — Other Ambulatory Visit: Payer: Self-pay

## 2024-02-14 ENCOUNTER — Ambulatory Visit (HOSPITAL_COMMUNITY)
Admission: RE | Admit: 2024-02-14 | Discharge: 2024-02-14 | Disposition: A | Discharge status: Home / Self Care | Attending: Cardiology | Admitting: Cardiology

## 2024-02-14 DIAGNOSIS — R600 Localized edema: Secondary | ICD-10-CM | POA: Diagnosis not present

## 2024-02-14 DIAGNOSIS — I25118 Atherosclerotic heart disease of native coronary artery with other forms of angina pectoris: Secondary | ICD-10-CM | POA: Diagnosis present

## 2024-02-14 DIAGNOSIS — I10 Essential (primary) hypertension: Secondary | ICD-10-CM | POA: Diagnosis not present

## 2024-02-14 HISTORY — PX: LEFT HEART CATH AND CORS/GRAFTS ANGIOGRAPHY: CATH118250

## 2024-02-14 LAB — GLUCOSE, CAPILLARY
Glucose-Capillary: 206 mg/dL — ABNORMAL HIGH (ref 70–99)
Glucose-Capillary: 137 mg/dL — ABNORMAL HIGH (ref 70–99)

## 2024-02-14 LAB — POCT I-STAT, CHEM 8
BUN: 7 mg/dL — ABNORMAL LOW (ref 8–23)
Calcium, Ion: 1.15 mmol/L (ref 1.15–1.40)
Chloride: 103 mmol/L (ref 98–111)
Creatinine, Ser: 0.8 mg/dL (ref 0.44–1.00)
Glucose, Bld: 200 mg/dL — ABNORMAL HIGH (ref 70–99)
HCT: 37 % (ref 36.0–46.0)
Hemoglobin: 12.6 g/dL (ref 12.0–15.0)
Potassium: 3.1 mmol/L — ABNORMAL LOW (ref 3.5–5.1)
Sodium: 141 mmol/L (ref 135–145)
TCO2: 23 mmol/L (ref 22–32)

## 2024-02-14 SURGERY — LEFT HEART CATH AND CORS/GRAFTS ANGIOGRAPHY
Anesthesia: LOCAL

## 2024-02-14 MED ORDER — ASPIRIN 81 MG PO CHEW
81.0000 mg | CHEWABLE_TABLET | ORAL | Status: AC
  Administered 2024-02-14: 81 mg via ORAL
  Filled 2024-02-14: qty 1

## 2024-02-14 MED ORDER — SODIUM CHLORIDE 0.9 % IV SOLN
250.0000 mL | INTRAVENOUS | Status: DC | PRN
Start: 2024-02-14 — End: 2024-02-14

## 2024-02-14 MED ORDER — LABETALOL HCL 5 MG/ML IV SOLN: 10.0000 mg | INTRAVENOUS | Status: DC | PRN

## 2024-02-14 MED ORDER — ACETAMINOPHEN 325 MG PO TABS
650.0000 mg | ORAL_TABLET | ORAL | Status: DC | PRN
Start: 2024-02-14 — End: 2024-02-14

## 2024-02-14 MED ORDER — IOHEXOL 350 MG/ML SOLN
INTRAVENOUS | Status: DC | PRN
  Administered 2024-02-14: 120 mL

## 2024-02-14 MED ORDER — SODIUM CHLORIDE 0.9% FLUSH: 3.0000 mL | Freq: Two times a day (BID) | INTRAVENOUS | Status: DC

## 2024-02-14 MED ORDER — FENTANYL CITRATE (PF) 100 MCG/2ML IJ SOLN
INTRAMUSCULAR | Status: DC | PRN
  Administered 2024-02-14: 25 ug via INTRAVENOUS

## 2024-02-14 MED ORDER — HEPARIN (PORCINE) IN NACL 2-0.9 UNITS/ML
INTRAMUSCULAR | Status: DC | PRN
  Administered 2024-02-14: 10 mL via INTRA_ARTERIAL

## 2024-02-14 MED ORDER — FREE WATER: 500.0000 mL | Freq: Once | Status: DC

## 2024-02-14 MED ORDER — FENTANYL CITRATE (PF) 100 MCG/2ML IJ SOLN
INTRAMUSCULAR | Status: AC
  Filled 2024-02-14: qty 2

## 2024-02-14 MED ORDER — SODIUM CHLORIDE 0.9% FLUSH
3.0000 mL | INTRAVENOUS | Status: DC | PRN
Start: 2024-02-14 — End: 2024-02-14

## 2024-02-14 MED ORDER — HEPARIN SODIUM (PORCINE) 1000 UNIT/ML IJ SOLN
INTRAMUSCULAR | Status: DC | PRN
  Administered 2024-02-14: 5000 [IU] via INTRAVENOUS

## 2024-02-14 MED ORDER — MIDAZOLAM HCL 2 MG/2ML IJ SOLN
INTRAMUSCULAR | Status: DC | PRN
  Administered 2024-02-14: 1 mg via INTRAVENOUS

## 2024-02-14 MED ORDER — LIDOCAINE HCL (PF) 1 % IJ SOLN
INTRAMUSCULAR | Status: DC | PRN
Start: 2024-02-14 — End: 2024-02-14
  Administered 2024-02-14: 2 mL via INTRADERMAL

## 2024-02-14 MED ORDER — POTASSIUM CHLORIDE CRYS ER 20 MEQ PO TBCR
60.0000 meq | EXTENDED_RELEASE_TABLET | Freq: Once | ORAL | Status: AC
  Administered 2024-02-14: 60 meq via ORAL
  Filled 2024-02-14: qty 3

## 2024-02-14 MED ORDER — HEPARIN (PORCINE) IN NACL 1000-0.9 UT/500ML-% IV SOLN
INTRAVENOUS | Status: DC | PRN
  Administered 2024-02-14: 1000 mL

## 2024-02-14 MED ORDER — HYDRALAZINE HCL 20 MG/ML IJ SOLN: 10.0000 mg | INTRAMUSCULAR | Status: DC | PRN

## 2024-02-14 MED ORDER — HEPARIN SODIUM (PORCINE) 1000 UNIT/ML IJ SOLN
INTRAMUSCULAR | Status: AC
  Filled 2024-02-14: qty 10

## 2024-02-14 MED ORDER — MIDAZOLAM HCL 2 MG/2ML IJ SOLN
INTRAMUSCULAR | Status: AC
  Filled 2024-02-14: qty 2

## 2024-02-14 MED ORDER — ONDANSETRON HCL 4 MG/2ML IJ SOLN: 4.0000 mg | Freq: Four times a day (QID) | INTRAMUSCULAR | Status: DC | PRN

## 2024-02-14 MED ORDER — SODIUM CHLORIDE 0.9 % IV SOLN: 250.0000 mL | INTRAVENOUS | Status: DC | PRN

## 2024-02-14 MED ORDER — LIDOCAINE HCL (PF) 1 % IJ SOLN
INTRAMUSCULAR | Status: AC
  Filled 2024-02-14: qty 30

## 2024-02-14 MED ORDER — VERAPAMIL HCL 2.5 MG/ML IV SOLN
INTRAVENOUS | Status: AC
  Filled 2024-02-14: qty 2

## 2024-02-14 SURGICAL SUPPLY — 8 items
CATH INFINITI 5 FR AR1 MOD (CATHETERS) IMPLANT
CATH INFINITI 5 FR IM (CATHETERS) IMPLANT
CATH INFINITI 5FR MULTPACK ANG (CATHETERS) IMPLANT
DEVICE RAD COMP TR BAND LRG (VASCULAR PRODUCTS) IMPLANT
GLIDESHEATH SLEND A-KIT 6F 22G (SHEATH) IMPLANT
GUIDEWIRE INQWIRE 1.5J.035X260 (WIRE) IMPLANT
PACK CARDIAC CATHETERIZATION (CUSTOM PROCEDURE TRAY) ×1 IMPLANT
SET ATX-X65L (MISCELLANEOUS) IMPLANT

## 2024-02-14 NOTE — Interval H&P Note (Signed)
 History and Physical Interval Note:  02/14/2024 8:33 AM  Amber Wong  has presented today for surgery, with the diagnosis of cad.  The various methods of treatment have been discussed with the patient and family. After consideration of risks, benefits and other options for treatment, the patient has consented to  Procedure(s): LEFT HEART CATH AND CORS/GRAFTS ANGIOGRAPHY (N/A) as a surgical intervention.  The patient's history has been reviewed, patient examined, no change in status, stable for surgery.  I have reviewed the patient's chart and labs.  Questions were answered to the patient's satisfaction.     Ralpheal Zappone J Kaula Klenke

## 2024-02-14 NOTE — H&P (Signed)
 OV 01/29/2024 copied for documentation   Cardiology Office Note:  .   Date:  02/14/2024  ID:  Amber Wong, DOB 31-Jan-1956, MRN [id number] PCP: Alec House, MD  [facility] Providers Cardiologist:  Newman Lawrence, MD PCP: Alec House, MD  C/C: Chest pain     History of Present Illness: .    Amber Wong is a 68 y.o. female with hypertension, hyperlipidemia, elevated lipoprotein (a), OSA, CAD s/p CABGX3 (LIMA-LAD, SVG-OM, SVG-diag)  Stress test result is significant ischemia, patient continues to have exertional chest pain with climbing up flight of stairs, associated with shortness of breath.  Recently, she also noticed leg edema, that she attributed to Repatha .  Therefore, she stopped taking Repatha  injections.    Vitals:   02/14/24 0746  BP: (!) 140/85  Pulse: 87  Resp: 20  Temp: 98.4 F (36.9 C)  SpO2: 97%      ROS:  Review of Systems  Cardiovascular:  Positive for chest pain and dyspnea on exertion. Negative for leg swelling, palpitations and syncope.  Respiratory:  Negative for cough.      Studies Reviewed: SABRA        EKG 12/04/2023: Normal sinus rhythm Normal ECG When compared with ECG of 22-Oct-2018 01:40, No significant change was found    Labs 07/2022: Chol 124, TG 57, HDL 51, LDL 60 HbA1C 5.6% Hb 15 Cr 0.9  Stress test 03/27/2023:   The study is normal. The study is low risk.   No ST deviation was noted.   LV perfusion is normal.   Left ventricular function is normal. Nuclear stress EF: 67%. The left ventricular ejection fraction is hyperdynamic (>65%). End diastolic cavity size is normal. End systolic cavity size is normal.   Prior study available for comparison from 10/03/2017. No changes compared to prior study. Normal perfusion, LVEF 63%   Normal perfusion. LVEF 67% with normal wall motion. This is a low risk study. No change compared to prior study in 2019.    Echocardiogram 03/26/2023: 1. Left  ventricular ejection fraction, by estimation, is 65 to 70%. The  left ventricle has normal function. The left ventricle has no regional  wall motion abnormalities. Left ventricular diastolic parameters were  normal.   2. Right ventricular systolic function is normal. The right ventricular  size is normal.   3. The mitral valve is normal in structure. Trivial mitral valve  regurgitation.   4. The aortic valve is tricuspid. Aortic valve regurgitation is not  visualized. Aortic valve sclerosis is present, with no evidence of aortic  valve stenosis.   5. The inferior vena cava is normal in size with greater than 50%  respiratory variability, suggesting right atrial pressure of 3 mmHg.    Physical Exam:   Physical Exam Vitals and nursing note reviewed.  Constitutional:      General: She is not in acute distress.    Appearance: She is obese.  Neck:     Vascular: No JVD.  Cardiovascular:     Rate and Rhythm: Normal rate and regular rhythm.     Heart sounds: Normal heart sounds. No murmur heard. Pulmonary:     Effort: Pulmonary effort is normal.     Breath sounds: Normal breath sounds. No wheezing or rales.  Musculoskeletal:     Right lower leg: No edema.     Left lower leg: No edema.      VISIT DIAGNOSES: No diagnosis found.      ASSESSMENT AND PLAN: .  Amber Wong is a 68 y.o. female with hypertension, hyperlipidemia, elevated lipoprotein (a), OSA, CAD    Chest pain: Recent stress test with no ischemia.  However, patient continues to have exertional chest pain symptoms concerning for angina.  Medical management of blood pressure limited due to low normal blood pressure. Recommend coronary and bypass Angiography with possible intervention. Continue aspirin , metoprolol , amlodipine , lisinopril .  Exertional dyspnea, leg edema: Concerning for heart failure.  Check echocardiogram, proBNP. I do not think leg edema was related to Repatha .  Resume Repatha .  I  have reviewed the risks, indications, and alternatives to cardiac catheterization, possible angioplasty, and stenting with the patient. Risks include but are not limited to bleeding, infection, vascular injury, stroke, myocardial infection, arrhythmia, kidney injury, radiation-related injury in the case of prolonged fluoroscopy use, emergency cardiac surgery, and death. The patient understands the risks of serious complication is 1-2 in 1000 with diagnostic cardiac cath and 1-2% or less with angioplasty/stenting.   Hypertension: Well-controlled   Informed Consent   Shared Decision Making/Informed Consent The risks [stroke (1 in 1000), death (1 in 1000), kidney failure [usually temporary] (1 in 500), bleeding (1 in 200), allergic reaction [possibly serious] (1 in 200)], benefits (diagnostic support and management of coronary artery disease) and alternatives of a cardiac catheterization were discussed in detail with Ms. Ousmane-Alhassane and she is willing to proceed.      F/u after cath  Signed, Newman [name], MD

## 2024-02-15 ENCOUNTER — Encounter (HOSPITAL_COMMUNITY): Payer: Self-pay | Admitting: Cardiology

## 2024-02-18 NOTE — Telephone Encounter (Signed)
 Hey, was this taken care of?

## 2024-02-28 ENCOUNTER — Encounter: Payer: Self-pay | Admitting: Emergency Medicine

## 2024-02-28 ENCOUNTER — Ambulatory Visit: Attending: Emergency Medicine | Admitting: Emergency Medicine

## 2024-02-28 VITALS — BP 114/66 | HR 80 | Ht 66.0 in | Wt 211.0 lb

## 2024-02-28 DIAGNOSIS — F17210 Nicotine dependence, cigarettes, uncomplicated: Secondary | ICD-10-CM

## 2024-02-28 DIAGNOSIS — R600 Localized edema: Secondary | ICD-10-CM

## 2024-02-28 DIAGNOSIS — G4733 Obstructive sleep apnea (adult) (pediatric): Secondary | ICD-10-CM

## 2024-02-28 DIAGNOSIS — I10 Essential (primary) hypertension: Secondary | ICD-10-CM | POA: Diagnosis not present

## 2024-02-28 DIAGNOSIS — I2510 Atherosclerotic heart disease of native coronary artery without angina pectoris: Secondary | ICD-10-CM

## 2024-02-28 DIAGNOSIS — E785 Hyperlipidemia, unspecified: Secondary | ICD-10-CM

## 2024-02-28 NOTE — Patient Instructions (Signed)
 Medication Instructions:  NO CHANGES  Lab Work: CMET AND FASTING LIPID PANEL TO BE DONE TODAY.   Testing/Procedures: NONE  Follow-Up: At [facility], you and your health needs are our priority.  As part of our continuing mission to provide you with exceptional heart care, our providers are all part of one team.  This team includes your primary Cardiologist (physician) and Advanced Practice Providers or APPs (Physician Assistants and Nurse Practitioners) who all work together to provide you with the care you need, when you need it.  Your next appointment:   3 MONTHS  Provider:   MADISON FOUNTAIN, NP

## 2024-02-28 NOTE — Progress Notes (Signed)
 Cardiology Office Note:    Date:  02/28/2024  ID:  Amber Wong, DOB Jul 02, 1955, MRN [id number] PCP: Alec House, MD   [facility] Providers Cardiologist:  Newman [name], MD Cardiology APP:  Rana Lum CROME, NP       Patient Profile:       Chief Complaint: Follow-up cardiac catheterization History of Present Illness:  Amber Wong is a 68 y.o. female with visit-pertinent history of hypertension, hyperlipidemia, OSA, coronary artery disease s/p CABG x 3 (LIMA-LAD, SVG-OM, SVG-diagonal) in May 2015  Cardiac catheterization in 2015 revealed hyperdynamic LV function, 80% distal left main, 50% proximal LAD lesion, normal left circumflex and RCA vessel.  She underwent CABG x3 by Dr. Fleeta Ochoa with LIMA-LAD, SVG-OM and SVG-diagonal. Echocardiogram in August 2018 showed EF 55 to 60%, grade 1 DD, ascending aorta measuring 39 mm, PA peak pressure 25 mmHg.  Myoview  obtained in April 2019 was low risk with EF of 63%.  Patient was last seen by Dr. Burnard on 09/13/2021 at which time she was doing well.   Echocardiogram 03/26/2023 with LVEF 65-70%, no RWMA, normal diastolic parameters, RV normal, trivial MR.  Lexiscan  Myoview  03/26/2023 was normal low risk study.  Patient presented to clinic on 01/29/2024.  Patient reports ongoing exertional chest pains with symptoms concerning for angina.  She also reported some mild exertional dyspnea as well as leg edema.  Her proBNP was normal.  She was started on Lasix  20 mg daily.  Underwent echocardiogram on 02/12/2024 with LVEF 55 to 60% grade 1 DD, RV function and size normal, left atrial size mild to moderately dilated, right atrial size mildly dilated.  She ultimately underwent cardiac catheterization 02/14/2024 showing severe native disease and patent 2/3 grafts (Attretic LIMA, but SVG-diag fills LAD).  No clear cause explain patient's chest pain symptoms.  Medical management was recommended.   Discussed the use of AI scribe  software for clinical note transcription with the patient, who gave verbal consent to proceed.  History of Present Illness Amber Shannahan Ousmane-Alhassane Ms.Kathrynn is a 68 year old female with coronary artery disease who presents for follow-up after cardiac catheterization.  Today patient is doing well overall.  She is without any new or acute complaints today.  She tells me her chest pain occurs only with high sodium meals or smoking but not with physical activity. She associates this pain with smoking and salty foods.  She denies dyspnea, orthopnea, PND, LEE.  No syncope, presyncope.  Leg swelling has improved with morning Lasix .  She tells me she she never received her prescription for potassium after her most recent labs showing hypokalemia.  Was started on Repatha  has used it a couple of times but feels like she is not injecting herself appropriately.  She has stopped smoking for a couple of months and uses a CPAP machine for sleep apnea.  Review of systems:  Please see the history of present illness. All other systems are reviewed and otherwise negative.      Studies Reviewed:    EKG Interpretation Date/Time:  Thursday February 28 2024 08:42:39 EDT Ventricular Rate:  80 PR Interval:  136 QRS Duration:  70 QT Interval:  372 QTC Calculation: 429 R Axis:   24  Text Interpretation: Normal sinus rhythm Normal ECG When compared with ECG of 14-Feb-2024 07:51, Premature ventricular complexes are no longer Present Confirmed by Rana Lum [phone]) on 02/28/2024 8:45:47 AM    Cardiac catheterization 02/14/2024 LM: Distal 90% stenosis LAD: Prox occlusion Lcx: Prox  occlusion         Mid 80% disease RCA: Large, dominant           Minimal luminal irregularities LIMA-LAD: Attretic, non-functional SVG-diag: Large graft with, no significant stenosis                  Fills diag as well as entirety of LAD SVG-OM:  Large graft with, no significant stenosis                   Fills OM/Lcx     LVEDP 1 mmHg  Conclusion: Severe native vessel disease (LM/LAD/Lcx) Patent 2/3 grafts (Attretic LIMA, but SVG-diag fills LAD)   No clear cause to explain patient's chest pain symptoms. Consider non-cardiac etiology. Diagnostic Dominance: Right  Echocardiogram 02/12/2024 1. Left ventricular ejection fraction, by estimation, is 55 to 60%. The  left ventricle has normal function. Left ventricular endocardial border  not optimally defined to evaluate regional wall motion. Definity echo  contrast may have been helpful but was  not used. Left ventricular diastolic parameters are consistent with Grade  I diastolic dysfunction (impaired relaxation).   2. Right ventricular systolic function is normal. The right ventricular  size is normal. Tricuspid regurgitation signal is inadequate for assessing  PA pressure.   3. Left atrial size was mild to moderately dilated.   4. Right atrial size was mildly dilated.   5. The mitral valve is normal in structure. No evidence of mitral valve  regurgitation. No evidence of mitral stenosis.   6. The aortic valve is tricuspid. Aortic valve regurgitation is not  visualized. No aortic stenosis is present.   7. The inferior vena cava is normal in size with greater than 50%  respiratory variability, suggesting right atrial pressure of 3 mmHg.   8. Technically difficult study with poor acoustic windows.   Lexiscan  Myoview  03/26/2023   The study is normal. The study is low risk.   No ST deviation was noted.   LV perfusion is normal.   Left ventricular function is normal. Nuclear stress EF: 67%. The left ventricular ejection fraction is hyperdynamic (>65%). End diastolic cavity size is normal. End systolic cavity size is normal.   Prior study available for comparison from 10/03/2017. No changes compared to prior study. Normal perfusion, LVEF 63%   Normal perfusion. LVEF 67% with normal wall motion. This is a low risk study. No change compared to prior study in  2019.  Risk Assessment/Calculations:              Physical Exam:   VS:  BP 114/66 (BP Location: Left Arm, Patient Position: Sitting, Cuff Size: Normal)   Pulse 80   Ht 5' 6 (1.676 m)   Wt 211 lb (95.7 kg)   BMI 34.06 kg/m    Wt Readings from Last 3 Encounters:  02/28/24 211 lb (95.7 kg)  02/14/24 216 lb (98 kg)  01/29/24 226 lb (102.5 kg)    GEN: Well nourished, well developed in no acute distress NECK: No JVD; No carotid bruits CARDIAC: RRR, no murmurs, rubs, gallops RESPIRATORY:  Clear to auscultation without rales, wheezing or rhonchi  ABDOMEN: Soft, non-tender, non-distended EXTREMITIES:  No edema; No acute deformity      Assessment and Plan:  Coronary artery disease Premature CAD requiring CABG in early 50s, s/p CABG x 3 (LIMA-LAD, SVG-OM, SVG-diagonal) Cardiac catheterization 02/2024 with severe native disease (LM/LAD, LCx) with patent 2/3 grafts (attretic LIMA, but SVG-backfills LAD).  No clear cause for chest pain  identified.  Medical management recommended Echocardiogram 02/2024 with LVEF 55 to 60% Lexiscan  Myoview  10/24 was normal low restudy - EKG today without acute ischemic changes - Today her chest pains have improved.  She denies any exertional chest pains.  Reports only gets chest pain when she smokes a cigarette or eats a high sodium meal.  No indication for further ischemic evaluation at this time - Continue amlodipine  2.5 mg daily, aspirin  81 mg daily, atorvastatin  40 mg daily, ezetimibe  10 mg daily, Repatha  140 mg q. 14 days, nitroglycerin  as needed - Encouraged heart healthy dieting and daily physical exercise  Hyperlipidemia, LDL <55 Lipoprotein (A) 219.6 on 07/2022 LDL 77 on 11/2023 (was not regularly taking Repatha  at this time) - She has since started back on Repatha   - Fasting lipid panel and LFTs today - Continue atorvastatin  40 mg daily, ezetimibe  10 mg daily, and Repatha  at 140 mg q. 14 days  Lower extremity edema Echocardiogram 02/2024 with LVEF  55 to 60%, grade 1 DD, RV normal proBNP on 8/19 was unremarkable - Today leg swelling has improved since addition of furosemide .  She no longer reports exertional dyspnea.  Denies orthopnea, PND.  She appears euvolemic - Her most recent potassium was 3.3 on 8/19.  Will repeat BMET today consider addition of potassium supplementation - Continue furosemide  20 mg daily - Repeat BMET today  Hypertension Blood pressure today is well-controlled at 114/66 - Continue current antihypertensive regimen  Obstructive sleep apnea - She remains adherent to CPAP therapy  Nicotine dependence - She is no longer smoking      Dispo:  Return in about 3 months (around 05/29/2024).  Signed, Lum LITTIE Louis, NP

## 2024-02-29 LAB — COMPREHENSIVE METABOLIC PANEL WITH GFR
ALT: 30 IU/L (ref 0–32)
AST: 35 IU/L (ref 0–40)
Albumin: 3.9 g/dL (ref 3.9–4.9)
Alkaline Phosphatase: 192 IU/L — ABNORMAL HIGH (ref 49–135)
BUN/Creatinine Ratio: 11 — ABNORMAL LOW (ref 12–28)
BUN: 8 mg/dL (ref 8–27)
Bilirubin Total: 0.5 mg/dL (ref 0.0–1.2)
CO2: 23 mmol/L (ref 20–29)
Calcium: 9.3 mg/dL (ref 8.7–10.3)
Chloride: 104 mmol/L (ref 96–106)
Creatinine, Ser: 0.74 mg/dL (ref 0.57–1.00)
Globulin, Total: 2.7 g/dL (ref 1.5–4.5)
Glucose: 73 mg/dL (ref 70–99)
Potassium: 3.8 mmol/L (ref 3.5–5.2)
Sodium: 143 mmol/L (ref 134–144)
Total Protein: 6.6 g/dL (ref 6.0–8.5)
eGFR: 89 mL/min/1.73 (ref >59)

## 2024-02-29 LAB — LIPID PANEL
Chol/HDL Ratio: 2.3 ratio (ref 0.0–4.4)
Cholesterol, Total: 124 mg/dL (ref 100–199)
HDL: 53 mg/dL (ref >39)
LDL Chol Calc (NIH): 58 mg/dL (ref 0–99)
Triglycerides: 61 mg/dL (ref 0–149)
VLDL Cholesterol Cal: 13 mg/dL (ref 5–40)

## 2024-03-02 ENCOUNTER — Ambulatory Visit: Payer: Self-pay | Admitting: Emergency Medicine

## 2024-03-03 ENCOUNTER — Telehealth: Payer: Self-pay | Admitting: Cardiology

## 2024-03-03 NOTE — Telephone Encounter (Signed)
 Attempted to call patient. No answer. Left message that results will be sent in MyChart  Rana Lum CROME, NP to Billy [facility], [facility]     03/02/24  2:02 PM Result Note Lab work shows normal electrolytes, glucose, and kidney function.  Cholesterol panel looks excellent with LDL (bad cholesterol) well-controlled at 58.  Potassium has normalized.  Overall reassuring results.

## 2024-03-03 NOTE — Telephone Encounter (Signed)
 Patient returning call for results

## 2024-03-05 ENCOUNTER — Emergency Department (HOSPITAL_COMMUNITY)
Admission: EM | Admit: 2024-03-05 | Discharge: 2024-03-06 | Disposition: A | Discharge status: Home / Self Care | Attending: Emergency Medicine | Admitting: Emergency Medicine

## 2024-03-05 ENCOUNTER — Emergency Department (HOSPITAL_COMMUNITY)

## 2024-03-05 ENCOUNTER — Other Ambulatory Visit: Payer: Self-pay

## 2024-03-05 DIAGNOSIS — R0981 Nasal congestion: Secondary | ICD-10-CM | POA: Diagnosis not present

## 2024-03-05 DIAGNOSIS — W0110XA Fall on same level from slipping, tripping and stumbling with subsequent striking against unspecified object, initial encounter: Secondary | ICD-10-CM | POA: Insufficient documentation

## 2024-03-05 DIAGNOSIS — R42 Dizziness and giddiness: Secondary | ICD-10-CM

## 2024-03-05 DIAGNOSIS — E876 Hypokalemia: Secondary | ICD-10-CM | POA: Diagnosis not present

## 2024-03-05 DIAGNOSIS — I509 Heart failure, unspecified: Secondary | ICD-10-CM | POA: Diagnosis not present

## 2024-03-05 DIAGNOSIS — Z79899 Other long term (current) drug therapy: Secondary | ICD-10-CM | POA: Insufficient documentation

## 2024-03-05 DIAGNOSIS — I2510 Atherosclerotic heart disease of native coronary artery without angina pectoris: Secondary | ICD-10-CM | POA: Diagnosis not present

## 2024-03-05 DIAGNOSIS — J449 Chronic obstructive pulmonary disease, unspecified: Secondary | ICD-10-CM | POA: Insufficient documentation

## 2024-03-05 DIAGNOSIS — I110 Hypertensive heart disease with heart failure: Secondary | ICD-10-CM | POA: Diagnosis not present

## 2024-03-05 DIAGNOSIS — R059 Cough, unspecified: Secondary | ICD-10-CM | POA: Insufficient documentation

## 2024-03-05 DIAGNOSIS — R748 Abnormal levels of other serum enzymes: Secondary | ICD-10-CM | POA: Insufficient documentation

## 2024-03-05 DIAGNOSIS — S0990XA Unspecified injury of head, initial encounter: Secondary | ICD-10-CM | POA: Insufficient documentation

## 2024-03-05 DIAGNOSIS — Z7982 Long term (current) use of aspirin: Secondary | ICD-10-CM | POA: Insufficient documentation

## 2024-03-05 LAB — COMPREHENSIVE METABOLIC PANEL WITH GFR
ALT: 19 U/L (ref 0–44)
AST: 23 U/L (ref 15–41)
Albumin: 3.5 g/dL (ref 3.5–5.0)
Alkaline Phosphatase: 145 U/L — ABNORMAL HIGH (ref 38–126)
Anion gap: 12 (ref 5–15)
BUN: 6 mg/dL — ABNORMAL LOW (ref 8–23)
CO2: 23 mmol/L (ref 22–32)
Calcium: 9.3 mg/dL (ref 8.9–10.3)
Chloride: 104 mmol/L (ref 98–111)
Creatinine, Ser: 0.75 mg/dL (ref 0.44–1.00)
GFR, Estimated: >60 mL/min (ref >60)
Glucose, Bld: 101 mg/dL — ABNORMAL HIGH (ref 70–99)
Potassium: 3 mmol/L — ABNORMAL LOW (ref 3.5–5.1)
Sodium: 139 mmol/L (ref 135–145)
Total Bilirubin: 0.9 mg/dL (ref 0.0–1.2)
Total Protein: 7 g/dL (ref 6.5–8.1)

## 2024-03-05 LAB — URINALYSIS, ROUTINE W REFLEX MICROSCOPIC
Bilirubin Urine: NEGATIVE
Glucose, UA: NEGATIVE mg/dL
Ketones, ur: NEGATIVE mg/dL
Leukocytes,Ua: NEGATIVE
Nitrite: NEGATIVE
Protein, ur: NEGATIVE mg/dL
Specific Gravity, Urine: 1.001 — ABNORMAL LOW (ref 1.005–1.030)
pH: 7 (ref 5.0–8.0)

## 2024-03-05 LAB — CBC WITH DIFFERENTIAL/PLATELET
Abs Immature Granulocytes: 0.02 K/uL (ref 0.00–0.07)
Basophils Absolute: 0 K/uL (ref 0.0–0.1)
Basophils Relative: 0 %
Eosinophils Absolute: 0.1 K/uL (ref 0.0–0.5)
Eosinophils Relative: 1 %
HCT: 37.9 % (ref 36.0–46.0)
Hemoglobin: 12.4 g/dL (ref 12.0–15.0)
Immature Granulocytes: 0 %
Lymphocytes Relative: 25 %
Lymphs Abs: 1.5 K/uL (ref 0.7–4.0)
MCH: 29.2 pg (ref 26.0–34.0)
MCHC: 32.7 g/dL (ref 30.0–36.0)
MCV: 89.2 fL (ref 80.0–100.0)
Monocytes Absolute: 0.6 K/uL (ref 0.1–1.0)
Monocytes Relative: 10 %
Neutro Abs: 3.9 K/uL (ref 1.7–7.7)
Neutrophils Relative %: 64 %
Platelets: 247 K/uL (ref 150–400)
RBC: 4.25 MIL/uL (ref 3.87–5.11)
RDW: 15 % (ref 11.5–15.5)
WBC: 6.1 K/uL (ref 4.0–10.5)
nRBC: 0 % (ref 0.0–0.2)

## 2024-03-05 LAB — RESP PANEL BY RT-PCR (RSV, FLU A&B, COVID)  RVPGX2
Influenza A by PCR: NEGATIVE
Influenza B by PCR: NEGATIVE
Resp Syncytial Virus by PCR: NEGATIVE
SARS Coronavirus 2 by RT PCR: NEGATIVE

## 2024-03-05 MED ORDER — MECLIZINE HCL 25 MG PO TABS: 25.0000 mg | ORAL_TABLET | Freq: Three times a day (TID) | ORAL | 0 refills | Status: AC | PRN

## 2024-03-05 MED ORDER — POTASSIUM CHLORIDE CRYS ER 20 MEQ PO TBCR: 20.0000 meq | EXTENDED_RELEASE_TABLET | Freq: Two times a day (BID) | ORAL | 0 refills | Status: AC

## 2024-03-05 MED ORDER — POTASSIUM CHLORIDE CRYS ER 20 MEQ PO TBCR
40.0000 meq | EXTENDED_RELEASE_TABLET | Freq: Once | ORAL | Status: AC
  Administered 2024-03-05: 40 meq via ORAL
  Filled 2024-03-05: qty 2

## 2024-03-05 NOTE — ED Provider Notes (Signed)
 [place] EMERGENCY DEPARTMENT AT [facility] Provider Note   CSN: [id number] Arrival date & time: 03/05/24  1616     Patient presents with: Dizziness   [name] is a 68 y.o. female past medical history significant for CHF, COPD, CAD, vertigo and hypertension presents today for dizziness that began this morning around 0630.  Patient does ports she fell and hit her head approximately a month ago.  Patient reports she took her at home meclizine  without improvement.  Patient also reporting cough and congestion.  Patient able to ambulate with steady gait.  Patient denies numbness, tingling, diplopia, tinnitus, head injury, weakness, nausea, vomiting, fever, chills, chest pain, or shortness of breath.  Patient denies blood thinners.    Dizziness      Prior to Admission medications   Medication Sig Start Date End Date Taking? Authorizing Provider  meclizine  (ANTIVERT ) 25 MG tablet Take 1 tablet (25 mg total) by mouth 3 (three) times daily as needed for dizziness. 03/05/24  Yes Joe Tanney N, PA-C  potassium chloride  SA (KLOR-CON  M) 20 MEQ tablet Take 1 tablet (20 mEq total) by mouth 2 (two) times daily. 03/05/24  Yes Abrham Maslowski N, PA-C  acyclovir  (ZOVIRAX ) 200 MG capsule Take 200 mg by mouth every morning.    [provider]  albuterol  (VENTOLIN  HFA) 108 (90 Base) MCG/ACT inhaler Inhale into the lungs every 6 (six) hours as needed for wheezing or shortness of breath.    [provider]  allopurinol  (ZYLOPRIM ) 100 MG tablet Take 100 mg by mouth 3 (three) times daily.    [provider]  amLODipine  (NORVASC ) 2.5 MG tablet Take 1 tablet (2.5 mg total) by mouth daily. 06/27/22   Burnard Debby LABOR, MD  aspirin  81 MG tablet Take 81 mg by mouth daily.     [provider]  atorvastatin  (LIPITOR ) 40 MG tablet Take 1 tablet (40 mg total) by mouth daily. 02/15/23   Patwardhan, Newman PARAS, MD  busPIRone (BUSPAR) 15 MG tablet Take 15 mg by mouth  2 (two) times daily. Patient taking differently: Take 15 mg by mouth daily. 04/20/23   [provider]  cholecalciferol (VITAMIN D) 1000 units tablet Take 1,000 Units by mouth daily.    [provider]  Evolocumab  (REPATHA  SURECLICK) 140 MG/ML SOAJ Inject 140 mg into the skin every 14 (fourteen) days. 07/12/23   Burnard Debby LABOR, MD  ezetimibe  (ZETIA ) 10 MG tablet TAKE 1 TABLET BY MOUTH EVERY DAY 01/11/24   Patwardhan, Newman PARAS, MD  furosemide  (LASIX ) 20 MG tablet Take 1 tablet (20 mg total) by mouth daily. 01/29/24 04/28/24  Patwardhan, Newman PARAS, MD  ketoconazole (NIZORAL) 2 % cream Apply topically 2 (two) times daily. 08/17/20   [provider]  lisinopril  (ZESTRIL ) 40 MG tablet Take 1 tablet (40 mg total) by mouth daily. 12/04/23   Patwardhan, Newman PARAS, MD  meclizine  (ANTIVERT ) 25 MG tablet meclizine  25 mg tablet  Take 1 tablet 3 times a day by oral route as needed for 10 days.    [provider]  meloxicam (MOBIC) 15 MG tablet Take 15 mg by mouth as needed.    [provider]  metroNIDAZOLE (METROGEL) 0.75 % vaginal gel metronidazole 0.75 % vaginal gel    [provider]  nitroGLYCERIN  (NITROSTAT ) 0.4 MG SL tablet Place 1 tablet (0.4 mg total) under the tongue every 5 (five) minutes as needed for chest pain. 01/29/24   Patwardhan, Newman PARAS, MD  pantoprazole  (PROTONIX ) 40  MG tablet Take 1 tablet (40 mg total) by mouth 2 (two) times daily. 08/20/18   Burnard Debby LABOR, MD  PARoxetine  (PAXIL ) 20 MG tablet Take 20 mg by mouth daily. 07/27/22   [provider]    Allergies: Grass pollen(k-[id number]-r-t-swt vern), Iohexol , Penicillins, and Shellfish allergy    Review of Systems  HENT:  Positive for congestion.   Respiratory:  Positive for cough.   Neurological:  Positive for dizziness.    Updated Vital Signs BP 135/87 (BP Location: Left Arm)   Pulse 71   Temp 98.4 F (36.9 C)   Resp 18   SpO2 100%   Physical Exam Vitals and nursing note  reviewed.  Constitutional:      General: She is not in acute distress.    Appearance: Normal appearance. She is well-developed. She is not ill-appearing or toxic-appearing.  HENT:     Head: Normocephalic and atraumatic.     Right Ear: External ear normal.     Left Ear: External ear normal.     Mouth/Throat:     Mouth: Mucous membranes are moist.  Eyes:     General: No visual field deficit.    Extraocular Movements: Extraocular movements intact.     Right eye: Normal extraocular motion and no nystagmus.     Left eye: Normal extraocular motion and no nystagmus.     Conjunctiva/sclera: Conjunctivae normal.     Pupils: Pupils are equal, round, and reactive to light.  Cardiovascular:     Rate and Rhythm: Normal rate and regular rhythm.     Pulses: Normal pulses.     Heart sounds: Normal heart sounds. No murmur heard. Pulmonary:     Effort: Pulmonary effort is normal. No respiratory distress.     Breath sounds: Normal breath sounds.  Abdominal:     Palpations: Abdomen is soft.     Tenderness: There is no abdominal tenderness.  Musculoskeletal:        General: No swelling.     Cervical back: Neck supple.     Right lower leg: No edema.     Left lower leg: No edema.  Skin:    General: Skin is warm and dry.     Capillary Refill: Capillary refill takes less than 2 seconds.  Neurological:     General: No focal deficit present.     Mental Status: She is alert.     GCS: GCS eye subscore is 4. GCS verbal subscore is 5. GCS motor subscore is 6.     Cranial Nerves: No cranial nerve deficit.     Sensory: Sensation is intact. No sensory deficit.     Motor: No weakness.  Psychiatric:        Mood and Affect: Mood normal.     (all labs ordered are listed, but only abnormal results are displayed) Labs Reviewed  COMPREHENSIVE METABOLIC PANEL WITH GFR - Abnormal; Notable for the following components:      Result Value   Potassium 3.0 (*)    Glucose, Bld 101 (*)    BUN 6 (*)    Alkaline  Phosphatase 145 (*)    All other components within normal limits  URINALYSIS, ROUTINE W REFLEX MICROSCOPIC - Abnormal; Notable for the following components:   Color, Urine COLORLESS (*)    Specific Gravity, Urine 1.001 (*)    Hgb urine dipstick SMALL (*)    Bacteria, UA RARE (*)    All other components within normal limits  RESP PANEL BY RT-PCR (RSV,  FLU A&B, COVID)  RVPGX2  CBC WITH DIFFERENTIAL/PLATELET    EKG: None  Radiology: CT Head Wo Contrast Result Date: 03/05/2024 CLINICAL DATA:  Head trauma, minor (Age >= 65y), left ear pain and vertigo. History of fall 1 month ago with headache. EXAM: CT HEAD WITHOUT CONTRAST TECHNIQUE: Contiguous axial images were obtained from the base of the skull through the vertex without intravenous contrast. RADIATION DOSE REDUCTION: This exam was performed according to the departmental dose-optimization program which includes automated exposure control, adjustment of the mA and/or kV according to patient size and/or use of iterative reconstruction technique. COMPARISON:  05/01/2007 FINDINGS: Brain: The ventricles appear age appropriate. No mass effect or midline shift. Gray-white differentiation is preserved.Scattered periventricular white matter hypoattenuation, most consistent with changes of mild chronic ischemic microvascular disease.No evidence of acute territorial infarction, extra-axial fluid collection, hemorrhage, or mass lesion. The basilar cisterns are patent without downward herniation. The cerebellar hemispheres and vermis are well formed without mass lesion or focal attenuation abnormality. Vascular: No hyperdense vessel. Calcified atherosclerotic plaque within the cavernous/supraclinoid internal carotid arteries. Skull: Normal. Negative for fracture or focal lesion. Sinuses/Orbits: The paranasal sinuses and mastoids are clear.The globes appear intact. No retrobulbar hematoma.Right lens replacement. Other: None. IMPRESSION: 1. No acute intracranial  abnormality, specifically, no acute hemorrhage, territorial infarction, or intracranial mass. 2. Mild sequelae of chronic ischemic microvascular disease. Electronically Signed   By: Rogelia Myers M.D.   On: 03/05/2024 18:16     Procedures   Medications Ordered in the ED  potassium chloride  SA (KLOR-CON  M) CR tablet 40 mEq (40 mEq Oral Given 03/05/24 2322)  meclizine  (ANTIVERT ) tablet 25 mg (25 mg Oral Given 03/06/24 0049)                                    Medical Decision Making  This patient presents to the ED for concern of dizziness, this involves an extensive number of treatment options, and is a complaint that carries with it a high risk of complications and morbidity.  The differential diagnosis includes arrhythmia, UTI, brain bleed, COVID, flu, RSV, anemia, electrolyte abnormality, hypertension, hypertensive emergency, hypoxia   Co morbidities / Chronic conditions that complicate the patient evaluation  Vertigo, hypertension, CHF, COPD   Additional history obtained:  Additional history obtained from EMR External records from outside source obtained and reviewed including Care Everywhere   Lab Tests:  I Ordered, and personally interpreted labs.  The pertinent results include: Hypokalemia 3.0, elevated alk phos at 145 which is chronic per historical values, CBC unremarkable, negative respiratory panel, UA with small hemoglobin, low specific gravity, rare bacteria   Imaging Studies ordered:  I ordered imaging studies including CT head Noncon I independently visualized and interpreted imaging which showed no acute intracranial abnormality I agree with the radiologist interpretation   Cardiac Monitoring: / EKG:  The patient was maintained on a cardiac monitor.  I personally viewed and interpreted the cardiac monitored which showed an underlying rhythm of: Sinus rhythm with occasional PVC   Test / Admission - Considered:  Patient is orthostatic positive, however  patient does not report any symptoms when going from lying to standing.  Patient is on furosemide , there is some suspicion of mild dehydration. Considered for admission or further workup however patient's vital signs, physical exam, labs, and imaging are reassuring.  Using shared decision making, patient would like to be discharged and follow-up outpatient.  Patient given  meclizine  and potassium for dizziness and hypokalemia.  Patient advised to follow-up with [facility] if her symptoms persist for further evaluation workup.  Patient given return precautions.  I feel patient safe for discharge at this time.     Final diagnoses:  Vertigo  Hypokalemia  Minor head injury, initial encounter    ED Discharge Orders          Ordered    potassium chloride  SA (KLOR-CON  M) 20 MEQ tablet  2 times daily        03/05/24 2314    meclizine  (ANTIVERT ) 25 MG tablet  3 times daily PRN        03/05/24 2314               Francis Ileana SAILOR, PA-C 03/06/24 0054    Lorette Mayo, MD 03/06/24 0345

## 2024-03-05 NOTE — Discharge Instructions (Addendum)
 Today you were seen for dizziness.  Your workup while in the emergency department was reassuring.  You were found to have low potassium.  You have been prescribed a few days of potassium.  Please follow-up with your primary care to ensure this level returns to normal.  You have also been prescribed meclizine  as needed for dizziness.  Please follow-up with the  [facility] if your symptoms persist for further evaluation workup.  Thank you for letting us  treat you today. After reviewing your labs and imaging, I feel you are safe to go home. Please follow up with your PCP in the next several days and provide them with your records from this visit. Return to the Emergency Room if pain becomes severe or symptoms worsen.

## 2024-03-05 NOTE — ED Provider Notes (Incomplete)
  EMERGENCY DEPARTMENT AT [facility] Provider Note   CSN: [id number] Arrival date & time: 03/05/24  1616     Patient presents with: Dizziness   [name] is a 68 y.o. female past medical history significant for CHF, COPD, CAD, vertigo and hypertension presents today for dizziness that began this morning around 0630.  Patient does ports she fell and hit her head approximately a month ago.  Patient reports she took her at home meclizine  without improvement.  Patient also reporting cough and congestion.  Patient able to ambulate with steady gait.  Patient denies numbness, tingling, diplopia, tinnitus, head injury, weakness, nausea, vomiting, fever, chills, chest pain, or shortness of breath.  Patient denies blood thinners.  {Add pertinent medical, surgical, social history, OB history to HPI:32947}  Dizziness      Prior to Admission medications   Medication Sig Start Date End Date Taking? Authorizing Provider  meclizine  (ANTIVERT ) 25 MG tablet Take 1 tablet (25 mg total) by mouth 3 (three) times daily as needed for dizziness. 03/05/24  Yes Keidy Thurgood N, PA-C  potassium chloride  SA (KLOR-CON  M) 20 MEQ tablet Take 1 tablet (20 mEq total) by mouth 2 (two) times daily. 03/05/24  Yes Kesia Dalto N, PA-C  acyclovir  (ZOVIRAX ) 200 MG capsule Take 200 mg by mouth every morning.    [provider]  albuterol  (VENTOLIN  HFA) 108 (90 Base) MCG/ACT inhaler Inhale into the lungs every 6 (six) hours as needed for wheezing or shortness of breath.    [provider]  allopurinol  (ZYLOPRIM ) 100 MG tablet Take 100 mg by mouth 3 (three) times daily.    [provider]  amLODipine  (NORVASC ) 2.5 MG tablet Take 1 tablet (2.5 mg total) by mouth daily. 06/27/22   Burnard Debby LABOR, MD  aspirin  81 MG tablet Take 81 mg by mouth daily.     [provider]  atorvastatin  (LIPITOR ) 40 MG tablet Take 1 tablet (40 mg total) by mouth daily. 02/15/23    Patwardhan, Newman PARAS, MD  busPIRone (BUSPAR) 15 MG tablet Take 15 mg by mouth 2 (two) times daily. Patient taking differently: Take 15 mg by mouth daily. 04/20/23   [provider]  cholecalciferol (VITAMIN D) 1000 units tablet Take 1,000 Units by mouth daily.    [provider]  Evolocumab  (REPATHA  SURECLICK) 140 MG/ML SOAJ Inject 140 mg into the skin every 14 (fourteen) days. 07/12/23   Burnard Debby LABOR, MD  ezetimibe  (ZETIA ) 10 MG tablet TAKE 1 TABLET BY MOUTH EVERY DAY 01/11/24   Patwardhan, Newman PARAS, MD  furosemide  (LASIX ) 20 MG tablet Take 1 tablet (20 mg total) by mouth daily. 01/29/24 04/28/24  Patwardhan, Newman PARAS, MD  ketoconazole (NIZORAL) 2 % cream Apply topically 2 (two) times daily. 08/17/20   [provider]  lisinopril  (ZESTRIL ) 40 MG tablet Take 1 tablet (40 mg total) by mouth daily. 12/04/23   Patwardhan, Newman PARAS, MD  meclizine  (ANTIVERT ) 25 MG tablet meclizine  25 mg tablet  Take 1 tablet 3 times a day by oral route as needed for 10 days.    [provider]  meloxicam (MOBIC) 15 MG tablet Take 15 mg by mouth as needed.    [provider]  metroNIDAZOLE (METROGEL) 0.75 % vaginal gel metronidazole 0.75 % vaginal gel    [provider]  nitroGLYCERIN  (NITROSTAT ) 0.4 MG SL tablet Place 1 tablet (0.4 mg total) under the tongue every 5 (five) minutes as needed for chest pain. 01/29/24  Patwardhan, Manish J, MD  pantoprazole  (PROTONIX ) 40 MG tablet Take 1 tablet (40 mg total) by mouth 2 (two) times daily. 08/20/18   Burnard Debby LABOR, MD  PARoxetine  (PAXIL ) 20 MG tablet Take 20 mg by mouth daily. 07/27/22   [provider]    Allergies: Grass pollen(k-o-r-t-swt vern), Iohexol , Penicillins, and Shellfish allergy    Review of Systems  HENT:  Positive for congestion.   Respiratory:  Positive for cough.   Neurological:  Positive for dizziness.    Updated Vital Signs BP 135/87 (BP Location: Left Arm)   Pulse 71   Temp 98.4 F (36.9  C)   Resp 18   SpO2 100%   Physical Exam Vitals and nursing note reviewed.  Constitutional:      General: She is not in acute distress.    Appearance: Normal appearance. She is well-developed. She is not ill-appearing or toxic-appearing.  HENT:     Head: Normocephalic and atraumatic.     Right Ear: External ear normal.     Left Ear: External ear normal.     Mouth/Throat:     Mouth: Mucous membranes are moist.  Eyes:     General: No visual field deficit.    Extraocular Movements: Extraocular movements intact.     Right eye: Normal extraocular motion and no nystagmus.     Left eye: Normal extraocular motion and no nystagmus.     Conjunctiva/sclera: Conjunctivae normal.     Pupils: Pupils are equal, round, and reactive to light.  Cardiovascular:     Rate and Rhythm: Normal rate and regular rhythm.     Pulses: Normal pulses.     Heart sounds: Normal heart sounds. No murmur heard. Pulmonary:     Effort: Pulmonary effort is normal. No respiratory distress.     Breath sounds: Normal breath sounds.  Abdominal:     Palpations: Abdomen is soft.     Tenderness: There is no abdominal tenderness.  Musculoskeletal:        General: No swelling.     Cervical back: Neck supple.     Right lower leg: No edema.     Left lower leg: No edema.  Skin:    General: Skin is warm and dry.     Capillary Refill: Capillary refill takes less than 2 seconds.  Neurological:     General: No focal deficit present.     Mental Status: She is alert.     GCS: GCS eye subscore is 4. GCS verbal subscore is 5. GCS motor subscore is 6.     Cranial Nerves: No cranial nerve deficit.     Sensory: Sensation is intact. No sensory deficit.     Motor: No weakness.  Psychiatric:        Mood and Affect: Mood normal.     (all labs ordered are listed, but only abnormal results are displayed) Labs Reviewed  COMPREHENSIVE METABOLIC PANEL WITH GFR - Abnormal; Notable for the following components:      Result Value    Potassium 3.0 (*)    Glucose, Bld 101 (*)    BUN 6 (*)    Alkaline Phosphatase 145 (*)    All other components within normal limits  URINALYSIS, ROUTINE W REFLEX MICROSCOPIC - Abnormal; Notable for the following components:   Color, Urine COLORLESS (*)    Specific Gravity, Urine 1.001 (*)    Hgb urine dipstick SMALL (*)    Bacteria, UA RARE (*)    All other components within  normal limits  RESP PANEL BY RT-PCR (RSV, FLU A&B, COVID)  RVPGX2  CBC WITH DIFFERENTIAL/PLATELET    EKG: None  Radiology: CT Head Wo Contrast Result Date: 03/05/2024 CLINICAL DATA:  Head trauma, minor (Age >= 65y), left ear pain and vertigo. History of fall 1 month ago with headache. EXAM: CT HEAD WITHOUT CONTRAST TECHNIQUE: Contiguous axial images were obtained from the base of the skull through the vertex without intravenous contrast. RADIATION DOSE REDUCTION: This exam was performed according to the departmental dose-optimization program which includes automated exposure control, adjustment of the mA and/or kV according to patient size and/or use of iterative reconstruction technique. COMPARISON:  05/01/2007 FINDINGS: Brain: The ventricles appear age appropriate. No mass effect or midline shift. Gray-white differentiation is preserved.Scattered periventricular white matter hypoattenuation, most consistent with changes of mild chronic ischemic microvascular disease.No evidence of acute territorial infarction, extra-axial fluid collection, hemorrhage, or mass lesion. The basilar cisterns are patent without downward herniation. The cerebellar hemispheres and vermis are well formed without mass lesion or focal attenuation abnormality. Vascular: No hyperdense vessel. Calcified atherosclerotic plaque within the cavernous/supraclinoid internal carotid arteries. Skull: Normal. Negative for fracture or focal lesion. Sinuses/Orbits: The paranasal sinuses and mastoids are clear.The globes appear intact. No retrobulbar hematoma.Right  lens replacement. Other: None. IMPRESSION: 1. No acute intracranial abnormality, specifically, no acute hemorrhage, territorial infarction, or intracranial mass. 2. Mild sequelae of chronic ischemic microvascular disease. Electronically Signed   By: Rogelia Myers M.D.   On: 03/05/2024 18:16    {Document cardiac monitor, telemetry assessment procedure when appropriate:32947} Procedures   Medications Ordered in the ED  potassium chloride  SA (KLOR-CON  M) CR tablet 40 mEq (has no administration in time range)      {Click here for ABCD2, HEART and other calculators REFRESH Note before signing:1}                              Medical Decision Making  This patient presents to the ED for concern of dizziness, this involves an extensive number of treatment options, and is a complaint that carries with it a high risk of complications and morbidity.  The differential diagnosis includes arrhythmia, UTI, brain bleed, COVID, flu, RSV, anemia, electrolyte abnormality, hypertension, hypertensive emergency, hypoxia   Co morbidities / Chronic conditions that complicate the patient evaluation  Vertigo, hypertension, CHF, COPD   Additional history obtained:  Additional history obtained from EMR External records from outside source obtained and reviewed including Care Everywhere   Lab Tests:  I Ordered, and personally interpreted labs.  The pertinent results include: Hypokalemia 3.0, elevated alk phos at 145 which is chronic per historical values, CBC unremarkable, negative respiratory panel, UA with small hemoglobin, low specific gravity, rare bacteria   Imaging Studies ordered:  I ordered imaging studies including CT head Noncon I independently visualized and interpreted imaging which showed no acute intracranial abnormality I agree with the radiologist interpretation   Cardiac Monitoring: / EKG:  The patient was maintained on a cardiac monitor.  I personally viewed and interpreted the  cardiac monitored which showed an underlying rhythm of: Sinus rhythm with occasional PVC   Test / Admission - Considered:  Patient is orthostatic positive, however patient does not report any symptoms when going from lying to standing.  Patient is on furosemide , there is some suspicion of mild dehydration. Considered for admission or further workup however patient's vital signs, physical exam, labs, and imaging are reassuring.  Patient given  meclizine  and potassium for dizziness and hypokalemia.  Patient advised to follow-up with [facility] if her symptoms persist for further evaluation workup.  Patient given return precautions.  I feel patient safe for discharge at this time.  {Document critical care time when appropriate  Document review of labs and clinical decision tools ie CHADS2VASC2, etc  Document your independent review of radiology images and any outside records  Document your discussion with family members, caretakers and with consultants  Document social determinants of health affecting pt's care  Document your decision making why or why not admission, treatments were needed:32947:::1}   Final diagnoses:  Vertigo  Hypokalemia  Minor head injury, initial encounter    ED Discharge Orders          Ordered    potassium chloride  SA (KLOR-CON  M) 20 MEQ tablet  2 times daily        03/05/24 2314    meclizine  (ANTIVERT ) 25 MG tablet  3 times daily PRN        03/05/24 2314

## 2024-03-05 NOTE — ED Triage Notes (Signed)
 BIB EMS from home. Hx of vertigo. Dizziness started upon waking this morning 630am. P took home meclizine  with no improvement. Pt is ambulatory with steady gait.   Cbg 95 BP 150/75 80 hr 100% ra

## 2024-03-05 NOTE — Telephone Encounter (Signed)
 Completed FMLA form faxed to [employer] and scanned into chart.  Billing notified.

## 2024-03-05 NOTE — Telephone Encounter (Signed)
 Please let me know if anything I need to do.  Thanks MJP

## 2024-03-05 NOTE — ED Notes (Signed)
 Son Jermaine James [phone] would like an update asap

## 2024-03-05 NOTE — ED Provider Triage Note (Signed)
 Emergency Medicine Provider Triage Evaluation Note  Amber Wong , a 68 y.o. female  was evaluated in triage.  Pt complains of left ear pain and vertigo. Also fell 1 month ago and having HA.  Review of Systems  Positive: Cough Negative: Fever  Physical Exam  BP (!) 140/89   Pulse 78   Temp 98.2 F (36.8 C)   Resp 18   SpO2 100%  Gen:   Awake, no distress   Resp:  Normal effort  MSK:   Moves extremities without difficulty  Other:    Medical Decision Making  Medically screening exam initiated at 4:38 PM.  Appropriate orders placed.  Amber Wong was informed that the remainder of the evaluation will be completed by another provider, this initial triage assessment does not replace that evaluation, and the importance of remaining in the ED until their evaluation is complete.     Amber Wong SAILOR, [place] 03/05/24 1639

## 2024-03-06 DIAGNOSIS — S0990XA Unspecified injury of head, initial encounter: Secondary | ICD-10-CM | POA: Diagnosis not present

## 2024-03-06 MED ORDER — MECLIZINE HCL 25 MG PO TABS
25.0000 mg | ORAL_TABLET | Freq: Once | ORAL | Status: AC
  Administered 2024-03-06: 25 mg via ORAL
  Filled 2024-03-06: qty 1

## 2024-03-10 ENCOUNTER — Ambulatory Visit: Admitting: Cardiology

## 2024-03-12 ENCOUNTER — Telehealth: Payer: Self-pay | Admitting: Cardiology

## 2024-03-12 NOTE — Telephone Encounter (Signed)
 Spoke with Amber Wong. States he saw the paperwork and you could not see where it is was signed. He would like it refaxed and emailed to [name]  Will see if there is still the original copy so that the signature can be seen.

## 2024-03-12 NOTE — Telephone Encounter (Signed)
 Pt's son would like a c/b regarding FMLA paperwork. Son's states that paperwork was sent to his employer but did not have provides signature. He is requesting that paperwork be resent via fax or email(see below). Please advise  Fax - [phone] Email - [email or web address]

## 2024-03-13 NOTE — Telephone Encounter (Signed)
 Son (Jermaine) stated the office has not received the paperwork and wants it re-faxed to fax - [phone] or emailed to [email or web address] before 10/4.  Son wants a call back to confirm.

## 2024-03-13 NOTE — Telephone Encounter (Signed)
 Amber Wong calling to request paperwork be refaxed. His had a deadline of 03/15/24. Please advise.

## 2024-03-13 NOTE — Telephone Encounter (Signed)
 Hey, do you mind looking at this. I think you were going to fax this.

## 2024-03-14 NOTE — Telephone Encounter (Signed)
 patient is going to come and get it the paperwork for her son, but she asked for us  to try and refax again because they can not see the doctors signature

## 2024-03-14 NOTE — Telephone Encounter (Signed)
 Pt's son calling back stating that his employer is not able to see providers signature on paperwork. He would like to know if he can just come by office and pickup paperwork. Please advise

## 2024-03-31 NOTE — Progress Notes (Unsigned)
 Subjective:   I, Ileana Collet, PhD, LAT, ATC acting as a scribe for Artist Lloyd, MD.  Chief Complaint: Amber Wong,  is a 68 y.o. female who presents for initial evaluation of a head injury. Pt fell and hit her head in mid-Aug. She was seen at the [facility] ED a month later on 03/05/24.  Today, pt c/o cont'd ***  Injury date: mid-Aug*** Visit #: 1  History of Present Illness:   Concussion Self-Reported Symptom Score Symptoms rated on a scale 1-6, in last 24 hours  Headache: ***   Pressure in head: *** Neck pain: *** Nausea or vomiting: *** Dizziness: ***  Blurred vision: ***  Balance problems: *** Sensitivity to light:  *** Sensitivity to noise: *** Feeling slowed down: *** Feeling like "in a fog": *** Don't feel right": *** Difficulty concentrating: *** Difficulty remembering: *** Fatigue or low energy: *** Confusion: *** Drowsiness: *** More emotional: *** Irritability: *** Sadness: *** Nervous or anxious: *** Trouble falling asleep: ***   Total # of Symptoms: ***/22 Total Symptom Score: ***/132  Tinnitus: Yes/No***  Review of Systems:  ***    Review of History: ***  Objective:    Physical Examination There were no vitals filed for this visit. MSK:  *** Neuro: *** Psych: ***     Imaging:  ***  Assessment and Plan   68 y.o. female with ***    ***    Action/Discussion: Reviewed diagnosis, management options, expected outcomes, and the reasons for scheduled and emergent follow-up. Questions were adequately answered. Patient expressed verbal understanding and agreement with the following plan.     Patient Education: Reviewed with patient the risks (i.e, a repeat concussion, post-concussion syndrome, second-impact syndrome) of returning to play prior to complete resolution, and thoroughly reviewed the signs and symptoms of concussion.Reviewed need for complete resolution of all symptoms, with rest AND exertion, prior to return  to play. Reviewed red flags for urgent medical evaluation: worsening symptoms, nausea/vomiting, intractable headache, musculoskeletal changes, focal neurological deficits. [facility] Concussion Care Plan, which clearly outlines the plans stated above, was given to patient.   Level of service: ***     After Visit Summary printed out and provided to patient as appropriate.  The above documentation has been reviewed and is accurate and complete Ileana GORMAN Collet

## 2024-04-01 ENCOUNTER — Ambulatory Visit (INDEPENDENT_AMBULATORY_CARE_PROVIDER_SITE_OTHER): Admitting: Family Medicine

## 2024-04-01 ENCOUNTER — Encounter: Payer: Self-pay | Admitting: Family Medicine

## 2024-04-01 VITALS — BP 120/82 | HR 73 | Ht 66.0 in | Wt 208.0 lb

## 2024-04-01 DIAGNOSIS — S060X9A Concussion with loss of consciousness of unspecified duration, initial encounter: Secondary | ICD-10-CM

## 2024-04-01 DIAGNOSIS — H8110 Benign paroxysmal vertigo, unspecified ear: Secondary | ICD-10-CM | POA: Diagnosis not present

## 2024-04-01 MED ORDER — NORTRIPTYLINE HCL 25 MG PO CAPS: 25.0000 mg | ORAL_CAPSULE | Freq: Every day | ORAL | 2 refills | Status: AC

## 2024-04-01 NOTE — Patient Instructions (Addendum)
 Thank you for coming in today.   A referral for physical therapy has been submitted. A representative from the physical therapy office will contact you to coordinate scheduling after confirming your benefits with your insurance provider. If you do not hear from the physical therapy office within the next 1-2 weeks, please let us  know.   See you back in 3 weeks

## 2024-04-03 ENCOUNTER — Ambulatory Visit: Admitting: Cardiology

## 2024-04-23 ENCOUNTER — Encounter: Admitting: Family Medicine

## 2024-04-24 ENCOUNTER — Telehealth: Payer: Self-pay

## 2024-04-29 ENCOUNTER — Ambulatory Visit

## 2024-05-02 ENCOUNTER — Ambulatory Visit: Admitting: Cardiology

## 2024-05-02 ENCOUNTER — Encounter: Payer: Self-pay | Admitting: Cardiology

## 2024-05-12 ENCOUNTER — Telehealth: Payer: Self-pay | Admitting: Cardiology

## 2024-05-12 ENCOUNTER — Ambulatory Visit: Attending: Cardiology

## 2024-05-12 DIAGNOSIS — R002 Palpitations: Secondary | ICD-10-CM

## 2024-05-12 NOTE — Telephone Encounter (Signed)
 Given recurrent palpitations, recommend 2-week Zio monitor.  Keep appointment with Lum Louis on 06/02/2024 for further discussion.  Thanks MJP

## 2024-05-12 NOTE — Telephone Encounter (Signed)
 Ordered monitor. LM for patient that I will send MyChart message w/update.

## 2024-05-12 NOTE — Progress Notes (Unsigned)
 Enrolled for [employer] to mail a ZIO XT long term holter monitor to the patients address on file.

## 2024-05-12 NOTE — Telephone Encounter (Signed)
 Patient c/o Palpitations: STAT if patient c/o lightheadedness, shortness of breath, or chest pain  How long have you had palpitations/irregular HR/ Afib? Are you having the symptoms now?  Feels/hearings palpitations about 1 month. She says she still feels it right now.  Are you currently experiencing lightheadedness, SOB or CP?  No   Do you have a history of afib (atrial fibrillation) or irregular heart rhythm?  Patient unsure   Have you checked your BP or HR? (document readings if available):  Patient says BP/HR have been high but she doesn't have any readings  Are you experiencing any other symptoms?   CP + headaches (not currently)

## 2024-05-12 NOTE — Telephone Encounter (Signed)
 Spoke with patient of Dr. Elmira. She reports palpitations with exertion and at night when lying down. This has been going on for several months. Episodes of palpitations occur with minimal exertion. Episodes lasting several minutes. She also reports BP goes up - which she can feel, gets dizzy. Does not monitor BP/pulse. She has OSA; has been off CPAP x1 month, machine broke. She said [employer] has been contacting her about a 5 year thing (?) - unclear what she is referring to  Advised will defer to sleep MD about CPAP, as untreated OSA could be source of palps. Will defer to general cardiologist for review.

## 2024-05-12 NOTE — Telephone Encounter (Signed)
 Left message to call back.

## 2024-05-13 ENCOUNTER — Telehealth: Payer: Self-pay | Admitting: Cardiology

## 2024-05-13 NOTE — Telephone Encounter (Signed)
*  STAT* If patient is at the pharmacy, call can be transferred to refill team.   1. Which medications need to be refilled? (please list name of each medication and dose if known)  furosemide  (LASIX ) 20 MG tablet   2. Which pharmacy/location (including street and city if local pharmacy) is medication to be sent to?  [facility] [place]  - [place], [place] - [phone]   3. Do they need a 30 day or 90 day supply? 90   Out of Medication

## 2024-05-13 NOTE — Telephone Encounter (Signed)
 Pt returning call

## 2024-05-13 NOTE — Telephone Encounter (Signed)
 Spoke with patient and shared message from Dr. Elmira:  Given recurrent palpitations, recommend 2-week Zio monitor.  Keep appointment with Lum Louis on 06/02/2024 for further discussion.   Thanks MJP      Patient verbalized understanding.  Patient states she needs to see Dr. Shlomo because her CPAP machine is broken, and she needs a new prescription from Dr. Shlomo for [employer] [facility] to send new machine. Patient previously saw Dr. Burnard and was due to see Dr. Shlomo in November, she is requesting sooner appt or to be placed on waitlist. Appt added to waitlst, will forward to Dr. Dorine nurse to see if able to move appt up.

## 2024-05-14 MED ORDER — FUROSEMIDE 20 MG PO TABS: 20.0000 mg | ORAL_TABLET | Freq: Every day | ORAL | 3 refills | Status: AC

## 2024-05-14 NOTE — Telephone Encounter (Signed)
 Refill sent

## 2024-05-19 ENCOUNTER — Telehealth: Payer: Self-pay | Admitting: Emergency Medicine

## 2024-05-19 NOTE — Telephone Encounter (Signed)
 Pt has an upcoming procedure 12/11 and would like to speak with a nurse regarding a note in procedure instructions that referred to fainting spells. Pt states she is feeling overwhelmed and has some questions. Please advise.

## 2024-05-19 NOTE — Telephone Encounter (Signed)
 Spoke with pt, okay to wait to apply monitor after her cataract surgery on 05/22/24. She has spoke with the monitor company. She will also wait to take her next repatha  injection after surgery.

## 2024-05-26 ENCOUNTER — Telehealth: Payer: Self-pay | Admitting: Cardiology

## 2024-05-26 NOTE — Telephone Encounter (Signed)
 Spoke to pt and she has several concerns. She expresses that she is overwhelmed just with everything that has been going in her life. At this time she is not wanting to do the heart monitor and is going to mail it back to the facility. She recently had a fall and experienced a concussion and the fall possible has caused concern with her recent cataract surgery. Pt admits that she is still having BLE swelling. She has also been experiencing chest pain with shortness of breath. Pt requested to see Lum Louis, NP, but appointment is not until 06/20/2024. Advised appointment with DOD and pt declined at this time and wants to wait to see [facility]. Appt was scheduled for 06/20/2024. ER precaution reviewed and she agrees with plan of care.   Also, pt would like to know if there is any way she can get in sooner to see Dr. Shlomo for cpap. Pt states that she has not been using a cpap due to needing a new prescription.

## 2024-05-26 NOTE — Telephone Encounter (Signed)
 Pt is requesting c/b from Amber Wong. She is confused and frustrated with the Zio monitor process. Please advise.

## 2024-05-30 ENCOUNTER — Ambulatory Visit: Admitting: Emergency Medicine

## 2024-06-02 ENCOUNTER — Telehealth: Payer: Self-pay | Admitting: Cardiology

## 2024-06-02 ENCOUNTER — Ambulatory Visit: Admitting: Emergency Medicine

## 2024-06-02 NOTE — Telephone Encounter (Signed)
" °  Per MyChart scheduling message:   The heart monitor usage. Time has run out for now. I didn't get a chance to use it for the 14 days that you all suggested, because of cataract surgery, neither did you all inform me about it until after the surgery with Dr.Pat ,until after I had the concussion. They are asking for or telling me that the time. is over for it, and I'm sending it back. For insurance purposes  "

## 2024-06-02 NOTE — Telephone Encounter (Signed)
 Pt mailing monitor back unused.  Will forward to [employer] for her knowledge.

## 2024-06-20 ENCOUNTER — Ambulatory Visit: Attending: Emergency Medicine | Admitting: Emergency Medicine

## 2024-06-20 ENCOUNTER — Encounter: Payer: Self-pay | Admitting: Emergency Medicine

## 2024-06-20 VITALS — BP 110/80 | HR 83 | Ht 66.0 in | Wt 206.0 lb

## 2024-06-20 DIAGNOSIS — I10 Essential (primary) hypertension: Secondary | ICD-10-CM | POA: Diagnosis not present

## 2024-06-20 DIAGNOSIS — R600 Localized edema: Secondary | ICD-10-CM

## 2024-06-20 DIAGNOSIS — R002 Palpitations: Secondary | ICD-10-CM | POA: Diagnosis not present

## 2024-06-20 DIAGNOSIS — F17210 Nicotine dependence, cigarettes, uncomplicated: Secondary | ICD-10-CM | POA: Diagnosis not present

## 2024-06-20 DIAGNOSIS — I2510 Atherosclerotic heart disease of native coronary artery without angina pectoris: Secondary | ICD-10-CM

## 2024-06-20 DIAGNOSIS — E785 Hyperlipidemia, unspecified: Secondary | ICD-10-CM | POA: Diagnosis not present

## 2024-06-20 DIAGNOSIS — G4733 Obstructive sleep apnea (adult) (pediatric): Secondary | ICD-10-CM

## 2024-06-20 NOTE — Patient Instructions (Addendum)
 Medication Instructions:  NO CHANGES  Lab Work: NONE TO BE DONE TODAY.  Testing/Procedures: NONE  Follow-Up: At [facility], you and your health needs are our priority.  As part of our continuing mission to provide you with exceptional heart care, our providers are all part of one team.  This team includes your primary Cardiologist (physician) and Advanced Practice Providers or APPs (Physician Assistants and Nurse Practitioners) who all work together to provide you with the care you need, when you need it.  Your next appointment:   6 MONTHS  Provider:   Newman [name], MD OR Lum Louis, NP    Other Instructions:

## 2024-06-20 NOTE — Progress Notes (Signed)
 " Cardiology Office Note:    Date:  06/20/2024  ID:  Amber Wong, DOB 09/28/1955, MRN [id number] PCP: Dulin, Michael, MD  [facility] Providers Cardiologist:  Amber [name], MD Cardiology APP:  Amber Lum CROME, NP       Patient Profile:       Chief Complaint: 86-month follow-up History of Present Illness:  Amber Wong is a 69 y.o. female with visit-pertinent history of hypertension, hyperlipidemia, OSA, coronary artery disease s/p CABG x 3 (LIMA-LAD, SVG-OM, SVG-diagonal) in May 2015   Cardiac catheterization in 2015 revealed hyperdynamic LV function, 80% distal left main, 50% proximal LAD lesion, normal left circumflex and RCA vessel.  She underwent CABG x3 by Dr. Fleeta Wong with LIMA-LAD, SVG-OM and SVG-diagonal. Echocardiogram in August 2018 showed EF 55 to 60%, grade 1 DD, ascending aorta measuring 39 mm, PA peak pressure 25 mmHg.  Myoview  obtained in April 2019 was low risk with EF of 63%.  Patient was last seen by Amber Wong on 09/13/2021 at which time she was doing well.    Echocardiogram 03/26/2023 with LVEF 65-70%, no RWMA, normal diastolic parameters, RV normal, trivial MR.  Lexiscan  Myoview  03/26/2023 was normal low risk study.   Patient presented to clinic on 01/29/2024.  Patient reports ongoing exertional chest pains with symptoms concerning for angina.  She also reported some mild exertional dyspnea as well as leg edema.  Her proBNP was normal.  She was started on Lasix  20 mg daily.  Underwent echocardiogram on 02/12/2024 with LVEF 55 to 60% grade 1 DD, RV function and size normal, left atrial size mild to moderately dilated, right atrial size mildly dilated.  She ultimately underwent cardiac catheterization 02/14/2024 showing severe native disease and patent 2/3 grafts (Attretic LIMA, but SVG-diag fills LAD).  No clear cause explain patient's chest pain symptoms.  Medical management was recommended.  Last seen in clinic on 02/28/2024.  Her chest  pains have improved and she denied any exertional symptoms.  Reports only gets chest discomfort when she smokes a cigarette or eats the meal high in sodium.  Her leg swelling had improved since addition of furosemide .  She remains adherent to CPAP therapy.  Blood pressure was well-controlled.  She was to follow-up in 3 months.   Discussed the use of AI scribe software for clinical note transcription with the patient, who gave verbal consent to proceed.  History of Present Illness Amber Cashatt Ousmane-Alhassane [name] is a 69 year old female with coronary artery disease and sleep apnea who presents for 60-month follow-up.  Today patient tells me she is doing well overall.  Feels like her health is currently fair.  Continues to experience some fatigue and mild shortness of breath.  She denies any chest discomfort, orthopnea, or PND.  She tells me her lower extremity edema has resolved as she takes Lasix  daily.  She has had no syncope or presyncope.  She feels like her shortness of breath has improved slightly since she stopped smoking.  She feels that her fatigue is coming from not using her CPAP within the past year.  She remains adherent to her current medication regimen.  In recent notes she had described palpitations however today she tells me she has never experienced palpitations.  Denies any episodes of fast heart rates.  No lightheadedness, dizziness, syncope, presyncope   Review of systems:  Please see the history of present illness. All other systems are reviewed and otherwise negative.      Studies Reviewed:  Cardiac catheterization 02/14/2024 LM: Distal 90% stenosis LAD: Prox occlusion Lcx: Prox occlusion         Mid 80% disease RCA: Large, dominant           Minimal luminal irregularities LIMA-LAD: Attretic, non-functional SVG-diag: Large graft with, no significant stenosis                  Fills diag as well as entirety of LAD SVG-OM:  Large graft with, no significant  stenosis                   Fills OM/Lcx    LVEDP 1 mmHg   Conclusion: Severe native vessel disease (LM/LAD/Lcx) Patent 2/3 grafts (Attretic LIMA, but SVG-diag fills LAD)   No clear cause to explain patient's chest pain symptoms. Consider non-cardiac etiology. Diagnostic Dominance: Right  Echocardiogram 02/12/2024 1. Left ventricular ejection fraction, by estimation, is 55 to 60%. The  left ventricle has normal function. Left ventricular endocardial border  not optimally defined to evaluate regional wall motion. Definity echo  contrast may have been helpful but was  not used. Left ventricular diastolic parameters are consistent with Grade  I diastolic dysfunction (impaired relaxation).   2. Right ventricular systolic function is normal. The right ventricular  size is normal. Tricuspid regurgitation signal is inadequate for assessing  PA pressure.   3. Left atrial size was mild to moderately dilated.   4. Right atrial size was mildly dilated.   5. The mitral valve is normal in structure. No evidence of mitral valve  regurgitation. No evidence of mitral stenosis.   6. The aortic valve is tricuspid. Aortic valve regurgitation is not  visualized. No aortic stenosis is present.   7. The inferior vena cava is normal in size with greater than 50%  respiratory variability, suggesting right atrial pressure of 3 mmHg.   8. Technically difficult study with poor acoustic windows.    Lexiscan  Myoview  03/26/2023   The study is normal. The study is low risk.   No ST deviation was noted.   LV perfusion is normal.   Left ventricular function is normal. Nuclear stress EF: 67%. The left ventricular ejection fraction is hyperdynamic (>65%). End diastolic cavity size is normal. End systolic cavity size is normal.   Prior study available for comparison from 10/03/2017. No changes compared to prior study. Normal perfusion, LVEF 63%   Normal perfusion. LVEF 67% with normal wall motion. This is a low risk  study. No change compared to prior study in 2019.  Risk Assessment/Calculations:              Physical Exam:   VS:  BP 110/80 (BP Location: Right Arm, Patient Position: Sitting, Cuff Size: Normal)   Pulse 83   Ht 5' 6 (1.676 m)   Wt 206 lb (93.4 kg)   BMI 33.25 kg/m    Wt Readings from Last 3 Encounters:  06/20/24 206 lb (93.4 kg)  04/01/24 208 lb (94.3 kg)  02/28/24 211 lb (95.7 kg)    GEN: Well nourished, well developed in no acute distress NECK: No JVD; No carotid bruits CARDIAC: RRR, no murmurs, rubs, gallops RESPIRATORY:  Clear to auscultation without rales, wheezing or rhonchi  ABDOMEN: Soft, non-tender, non-distended EXTREMITIES:  No edema; No acute deformity      Assessment and Plan:  Coronary artery disease Premature CAD requiring CABG in early 50s, s/p CABG x 3 (LIMA-LAD, SVG-OM, SVG-diagonal) Cardiac catheterization 02/2024 with severe native disease (LM/LAD, LCx) with patent  2/3 grafts (attretic LIMA, but SVG-backfills LAD).  No clear cause for chest pain identified.  Medical management recommended Echocardiogram 02/2024 with LVEF 55 to 60% - EKG today without acute ischemic changes - Today she is stable without chest pains.  She is without symptoms concerning for active angina.  Reports her chest discomfort resolved when she stopped smoking cigarettes.  No symptoms to suggest active angina.  No indication further ischemic evaluation at this time - Continue amlodipine  2.5 mg daily, aspirin  81 mg daily, atorvastatin  40 mg daily, ezetimibe  10 mg daily, Repatha  140 mg q. 14 days, and nitroglycerin  as needed   Hyperlipidemia, LDL <55 Lipoprotein (A) 219.6 on 07/2022 LDL 58 on 02/2024 and controlled - Continue atorvastatin  40 mg daily, ezetimibe  10 mg daily, and Repatha  at 140 mg q. 14 days   Lower extremity edema Echocardiogram 02/2024 with LVEF 55 to 60%, grade 1 DD, RV normal proBNP on 8/19 was unremarkable - Today she appears euvolemic and well compensated on exam.   Lower extremity swelling has resolved on diuretic therapy - Continue furosemide  20 mg daily   Hypertension Blood pressure today is well-controlled at 110/80 - Continue current antihypertensive regimen   Obstructive sleep apnea Has been off of CPAP therapy now for about 1 year.  Uncontrolled sleep apnea is likely what is been contributing to her fatigue - She is establishing with Dr. Shlomo of sleep medicine on 06/23/2024   Nicotine dependence - She is no longer smoking  Palpitations Patient reported palpitations about 1 month ago that was very infrequent and short-lived.  These palpitations entirely resolved and she decided not to wear the heart monitor and mailed it back in - Stable with no complaints of palpitations today      Dispo:  Return in about 6 months (around 12/18/2024).  Signed, Lum LITTIE Louis, NP  "

## 2024-06-23 ENCOUNTER — Ambulatory Visit: Admitting: Cardiology

## 2024-07-02 ENCOUNTER — Other Ambulatory Visit: Payer: Self-pay | Admitting: Pharmacist

## 2024-07-02 DIAGNOSIS — I2510 Atherosclerotic heart disease of native coronary artery without angina pectoris: Secondary | ICD-10-CM

## 2024-07-02 DIAGNOSIS — E785 Hyperlipidemia, unspecified: Secondary | ICD-10-CM

## 2024-07-02 MED ORDER — REPATHA SURECLICK 140 MG/ML ~~LOC~~ SOAJ: 140.0000 mg | SUBCUTANEOUS | 3 refills | Status: AC
